# Patient Record
Sex: Female | Born: 1965 | Race: White | Hispanic: No | Marital: Single | Smoking: Never smoker
Health system: Southern US, Community
[De-identification: ages and names within clinical notes are randomized; demographics above are authoritative.]

## PROBLEM LIST (undated history)

## (undated) DIAGNOSIS — F419 Anxiety disorder, unspecified: Secondary | ICD-10-CM

## (undated) DIAGNOSIS — K219 Gastro-esophageal reflux disease without esophagitis: Secondary | ICD-10-CM

## (undated) DIAGNOSIS — R011 Cardiac murmur, unspecified: Secondary | ICD-10-CM

## (undated) DIAGNOSIS — I4719 Other supraventricular tachycardia: Secondary | ICD-10-CM

## (undated) DIAGNOSIS — R1900 Intra-abdominal and pelvic swelling, mass and lump, unspecified site: Secondary | ICD-10-CM

## (undated) DIAGNOSIS — I421 Obstructive hypertrophic cardiomyopathy: Secondary | ICD-10-CM

## (undated) DIAGNOSIS — Z9889 Other specified postprocedural states: Secondary | ICD-10-CM

## (undated) DIAGNOSIS — I472 Ventricular tachycardia, unspecified: Secondary | ICD-10-CM

## (undated) DIAGNOSIS — K922 Gastrointestinal hemorrhage, unspecified: Secondary | ICD-10-CM

## (undated) DIAGNOSIS — Z950 Presence of cardiac pacemaker: Secondary | ICD-10-CM

## (undated) DIAGNOSIS — J302 Other seasonal allergic rhinitis: Secondary | ICD-10-CM

## (undated) DIAGNOSIS — R42 Dizziness and giddiness: Secondary | ICD-10-CM

## (undated) DIAGNOSIS — D649 Anemia, unspecified: Secondary | ICD-10-CM

## (undated) DIAGNOSIS — K3580 Unspecified acute appendicitis: Secondary | ICD-10-CM

## (undated) DIAGNOSIS — D128 Benign neoplasm of rectum: Secondary | ICD-10-CM

## (undated) DIAGNOSIS — I509 Heart failure, unspecified: Secondary | ICD-10-CM

## (undated) DIAGNOSIS — R7309 Other abnormal glucose: Secondary | ICD-10-CM

## (undated) DIAGNOSIS — I471 Supraventricular tachycardia: Secondary | ICD-10-CM

## (undated) DIAGNOSIS — R739 Hyperglycemia, unspecified: Secondary | ICD-10-CM

## (undated) DIAGNOSIS — R112 Nausea with vomiting, unspecified: Secondary | ICD-10-CM

## (undated) DIAGNOSIS — I4891 Unspecified atrial fibrillation: Secondary | ICD-10-CM

## (undated) DIAGNOSIS — D219 Benign neoplasm of connective and other soft tissue, unspecified: Secondary | ICD-10-CM

## (undated) DIAGNOSIS — Z8489 Family history of other specified conditions: Secondary | ICD-10-CM

## (undated) DIAGNOSIS — K810 Acute cholecystitis: Secondary | ICD-10-CM

## (undated) HISTORY — DX: Benign neoplasm of rectum: D12.8

## (undated) HISTORY — PX: CARDIAC DEFIBRILLATOR PLACEMENT: SHX171

## (undated) HISTORY — DX: Supraventricular tachycardia: I47.1

## (undated) HISTORY — PX: APPENDECTOMY: SHX54

## (undated) HISTORY — PX: CHOLECYSTECTOMY: SHX55

## (undated) HISTORY — DX: Other seasonal allergic rhinitis: J30.2

## (undated) HISTORY — DX: Acute cholecystitis: K81.0

## (undated) HISTORY — DX: Anemia, unspecified: D64.9

## (undated) HISTORY — DX: Ventricular tachycardia: I47.2

## (undated) HISTORY — DX: Other supraventricular tachycardia: I47.19

## (undated) HISTORY — DX: Hyperglycemia, unspecified: R73.9

## (undated) HISTORY — DX: Gastrointestinal hemorrhage, unspecified: K92.2

## (undated) HISTORY — DX: Dizziness and giddiness: R42

## (undated) HISTORY — DX: Intra-abdominal and pelvic swelling, mass and lump, unspecified site: R19.00

## (undated) HISTORY — DX: Other abnormal glucose: R73.09

## (undated) HISTORY — DX: Unspecified acute appendicitis: K35.80

## (undated) HISTORY — DX: Unspecified atrial fibrillation: I48.91

## (undated) HISTORY — DX: Obstructive hypertrophic cardiomyopathy: I42.1

## (undated) HISTORY — DX: Gastro-esophageal reflux disease without esophagitis: K21.9

## (undated) HISTORY — DX: Benign neoplasm of connective and other soft tissue, unspecified: D21.9

## (undated) HISTORY — DX: Ventricular tachycardia, unspecified: I47.20

## (undated) HISTORY — DX: Anxiety disorder, unspecified: F41.9

---

## 1997-10-05 ENCOUNTER — Encounter: Admission: RE | Admit: 1997-10-05 | Discharge: 1997-10-05 | Payer: Self-pay | Admitting: Internal Medicine

## 1998-01-17 ENCOUNTER — Ambulatory Visit (HOSPITAL_COMMUNITY): Admission: RE | Admit: 1998-01-17 | Discharge: 1998-01-17 | Payer: Self-pay | Admitting: Cardiology

## 1998-10-19 ENCOUNTER — Other Ambulatory Visit: Admission: RE | Admit: 1998-10-19 | Discharge: 1998-10-19 | Payer: Self-pay | Admitting: *Deleted

## 1998-11-10 ENCOUNTER — Encounter: Admission: RE | Admit: 1998-11-10 | Discharge: 1998-11-10 | Payer: Self-pay | Admitting: Internal Medicine

## 2000-04-25 ENCOUNTER — Other Ambulatory Visit: Admission: RE | Admit: 2000-04-25 | Discharge: 2000-04-25 | Payer: Self-pay | Admitting: Obstetrics and Gynecology

## 2001-05-22 ENCOUNTER — Other Ambulatory Visit: Admission: RE | Admit: 2001-05-22 | Discharge: 2001-05-22 | Payer: Self-pay | Admitting: Obstetrics and Gynecology

## 2001-07-12 DIAGNOSIS — K810 Acute cholecystitis: Secondary | ICD-10-CM

## 2001-07-12 HISTORY — DX: Acute cholecystitis: K81.0

## 2001-07-13 ENCOUNTER — Inpatient Hospital Stay (HOSPITAL_COMMUNITY): Admission: EM | Admit: 2001-07-13 | Discharge: 2001-07-15 | Payer: Self-pay | Admitting: *Deleted

## 2001-07-13 ENCOUNTER — Encounter: Payer: Self-pay | Admitting: General Surgery

## 2001-07-13 ENCOUNTER — Encounter (INDEPENDENT_AMBULATORY_CARE_PROVIDER_SITE_OTHER): Payer: Self-pay | Admitting: *Deleted

## 2001-07-13 DIAGNOSIS — K810 Acute cholecystitis: Secondary | ICD-10-CM

## 2001-07-14 ENCOUNTER — Encounter: Payer: Self-pay | Admitting: General Surgery

## 2001-07-14 DIAGNOSIS — Z9089 Acquired absence of other organs: Secondary | ICD-10-CM

## 2001-07-14 HISTORY — PX: LAPAROSCOPIC CHOLECYSTECTOMY: SUR755

## 2002-06-30 ENCOUNTER — Other Ambulatory Visit: Admission: RE | Admit: 2002-06-30 | Discharge: 2002-06-30 | Payer: Self-pay | Admitting: Obstetrics and Gynecology

## 2003-08-03 ENCOUNTER — Other Ambulatory Visit: Admission: RE | Admit: 2003-08-03 | Discharge: 2003-08-03 | Payer: Self-pay | Admitting: Obstetrics and Gynecology

## 2004-11-20 ENCOUNTER — Other Ambulatory Visit: Admission: RE | Admit: 2004-11-20 | Discharge: 2004-11-20 | Payer: Self-pay | Admitting: Obstetrics and Gynecology

## 2005-12-06 ENCOUNTER — Other Ambulatory Visit: Admission: RE | Admit: 2005-12-06 | Discharge: 2005-12-06 | Payer: Self-pay | Admitting: Obstetrics & Gynecology

## 2006-12-17 ENCOUNTER — Other Ambulatory Visit: Admission: RE | Admit: 2006-12-17 | Discharge: 2006-12-17 | Payer: Self-pay | Admitting: Obstetrics & Gynecology

## 2007-08-29 ENCOUNTER — Inpatient Hospital Stay (HOSPITAL_COMMUNITY): Admission: AD | Admit: 2007-08-29 | Discharge: 2007-09-03 | Payer: Self-pay | Admitting: Cardiology

## 2007-08-29 ENCOUNTER — Ambulatory Visit: Payer: Self-pay | Admitting: Internal Medicine

## 2007-09-02 ENCOUNTER — Encounter: Payer: Self-pay | Admitting: Cardiology

## 2007-09-24 ENCOUNTER — Ambulatory Visit: Payer: Self-pay

## 2007-09-24 LAB — CONVERTED CEMR LAB
INR: 1.5 — ABNORMAL HIGH (ref 0.8–1.0)
Prothrombin Time: 16.9 s — ABNORMAL HIGH (ref 10.9–13.3)

## 2007-09-25 ENCOUNTER — Observation Stay (HOSPITAL_COMMUNITY): Admission: AD | Admit: 2007-09-25 | Discharge: 2007-09-25 | Payer: Self-pay | Admitting: Internal Medicine

## 2007-09-25 ENCOUNTER — Ambulatory Visit: Payer: Self-pay | Admitting: Internal Medicine

## 2007-09-25 DIAGNOSIS — Z95810 Presence of automatic (implantable) cardiac defibrillator: Secondary | ICD-10-CM | POA: Insufficient documentation

## 2007-09-30 ENCOUNTER — Ambulatory Visit: Payer: Self-pay | Admitting: Hospitalist

## 2007-09-30 ENCOUNTER — Encounter (INDEPENDENT_AMBULATORY_CARE_PROVIDER_SITE_OTHER): Payer: Self-pay | Admitting: *Deleted

## 2007-09-30 ENCOUNTER — Ambulatory Visit (HOSPITAL_COMMUNITY): Admission: RE | Admit: 2007-09-30 | Discharge: 2007-09-30 | Payer: Self-pay | Admitting: Hospitalist

## 2007-09-30 DIAGNOSIS — I4891 Unspecified atrial fibrillation: Secondary | ICD-10-CM

## 2007-09-30 DIAGNOSIS — K219 Gastro-esophageal reflux disease without esophagitis: Secondary | ICD-10-CM

## 2007-09-30 DIAGNOSIS — J301 Allergic rhinitis due to pollen: Secondary | ICD-10-CM | POA: Insufficient documentation

## 2007-09-30 LAB — CONVERTED CEMR LAB
Basophils Relative: 0 % (ref 0–1)
Lymphs Abs: 1.1 10*3/uL (ref 0.7–4.0)
MCHC: 33.2 g/dL (ref 30.0–36.0)
Monocytes Relative: 7 % (ref 3–12)
Neutro Abs: 11 10*3/uL — ABNORMAL HIGH (ref 1.7–7.7)
Neutrophils Relative %: 84 % — ABNORMAL HIGH (ref 43–77)
RBC: 4.28 M/uL (ref 3.87–5.11)
WBC: 13.1 10*3/uL — ABNORMAL HIGH (ref 4.0–10.5)

## 2007-10-02 ENCOUNTER — Ambulatory Visit: Payer: Self-pay | Admitting: Internal Medicine

## 2007-10-02 LAB — CONVERTED CEMR LAB: INR: 2.9

## 2007-10-08 ENCOUNTER — Ambulatory Visit: Payer: Self-pay

## 2007-10-16 ENCOUNTER — Ambulatory Visit: Payer: Self-pay | Admitting: Internal Medicine

## 2007-11-28 ENCOUNTER — Emergency Department (HOSPITAL_COMMUNITY): Admission: EM | Admit: 2007-11-28 | Discharge: 2007-11-29 | Payer: Self-pay | Admitting: Emergency Medicine

## 2007-12-02 ENCOUNTER — Encounter: Payer: Self-pay | Admitting: Internal Medicine

## 2007-12-04 ENCOUNTER — Encounter: Payer: Self-pay | Admitting: Internal Medicine

## 2008-01-01 ENCOUNTER — Other Ambulatory Visit: Admission: RE | Admit: 2008-01-01 | Discharge: 2008-01-01 | Payer: Self-pay | Admitting: Obstetrics and Gynecology

## 2008-01-01 ENCOUNTER — Encounter: Payer: Self-pay | Admitting: Internal Medicine

## 2008-01-10 HISTORY — PX: RADIOFREQUENCY ABLATION: SHX2290

## 2008-01-27 ENCOUNTER — Ambulatory Visit: Payer: Self-pay | Admitting: Internal Medicine

## 2008-02-10 ENCOUNTER — Encounter: Payer: Self-pay | Admitting: Internal Medicine

## 2008-02-11 ENCOUNTER — Encounter: Payer: Self-pay | Admitting: Internal Medicine

## 2008-02-11 DIAGNOSIS — I309 Acute pericarditis, unspecified: Secondary | ICD-10-CM

## 2008-02-11 LAB — CONVERTED CEMR LAB
CO2: 27 meq/L
Chloride: 105 meq/L
Creatinine, Ser: 1 mg/dL
Platelets: 204 10*3/uL
Total Bilirubin: 1 mg/dL
WBC: 16.1 10*3/uL

## 2008-02-14 ENCOUNTER — Encounter: Payer: Self-pay | Admitting: Internal Medicine

## 2008-02-15 ENCOUNTER — Encounter: Payer: Self-pay | Admitting: Internal Medicine

## 2008-04-06 ENCOUNTER — Encounter: Payer: Self-pay | Admitting: Internal Medicine

## 2008-04-26 ENCOUNTER — Ambulatory Visit: Payer: Self-pay | Admitting: Internal Medicine

## 2008-07-06 ENCOUNTER — Encounter: Payer: Self-pay | Admitting: Internal Medicine

## 2008-07-26 ENCOUNTER — Ambulatory Visit: Payer: Self-pay | Admitting: Internal Medicine

## 2008-09-01 ENCOUNTER — Ambulatory Visit: Payer: Self-pay | Admitting: Internal Medicine

## 2008-09-01 DIAGNOSIS — D509 Iron deficiency anemia, unspecified: Secondary | ICD-10-CM

## 2008-09-03 DIAGNOSIS — I421 Obstructive hypertrophic cardiomyopathy: Secondary | ICD-10-CM | POA: Insufficient documentation

## 2008-09-03 DIAGNOSIS — I472 Ventricular tachycardia, unspecified: Secondary | ICD-10-CM | POA: Insufficient documentation

## 2008-09-03 DIAGNOSIS — I471 Supraventricular tachycardia: Secondary | ICD-10-CM

## 2008-09-06 ENCOUNTER — Ambulatory Visit: Payer: Self-pay | Admitting: Internal Medicine

## 2008-09-06 LAB — CONVERTED CEMR LAB
Albumin: 4 g/dL (ref 3.5–5.2)
Basophils Absolute: 0 10*3/uL (ref 0.0–0.1)
CO2: 23 meq/L (ref 19–32)
Calcium: 9.1 mg/dL (ref 8.4–10.5)
Cholesterol: 150 mg/dL (ref 0–200)
GFR calc Af Amer: >60 mL/min (ref >60)
GFR calc non Af Amer: 59 mL/min — ABNORMAL LOW (ref >60)
Glucose, Bld: 83 mg/dL (ref 70–99)
LDL Cholesterol: 81 mg/dL (ref 0–99)
Lymphocytes Relative: 16 % (ref 12–46)
Neutro Abs: 6.5 10*3/uL (ref 1.7–7.7)
Platelets: 277 10*3/uL (ref 150–400)
Potassium: 4.5 meq/L (ref 3.5–5.3)
RDW: 13.6 % (ref 11.5–15.5)
Sodium: 141 meq/L (ref 135–145)
Total Protein: 6.4 g/dL (ref 6.0–8.3)
Triglycerides: 90 mg/dL (ref <150)

## 2008-09-07 ENCOUNTER — Encounter: Payer: Self-pay | Admitting: Internal Medicine

## 2008-09-14 ENCOUNTER — Encounter: Payer: Self-pay | Admitting: Internal Medicine

## 2008-09-14 ENCOUNTER — Ambulatory Visit: Payer: Self-pay | Admitting: Internal Medicine

## 2008-10-12 ENCOUNTER — Telehealth: Payer: Self-pay | Admitting: Adult Health

## 2008-10-12 ENCOUNTER — Ambulatory Visit: Payer: Self-pay | Admitting: Internal Medicine

## 2008-10-12 LAB — CONVERTED CEMR LAB
BUN: 11 mg/dL (ref 6–23)
Basophils Absolute: 0.1 10*3/uL (ref 0.0–0.1)
Chloride: 103 meq/L (ref 96–112)
Eosinophils Relative: 1 % (ref 0–5)
HCT: 41.2 % (ref 36.0–46.0)
Hemoglobin: 13.8 g/dL (ref 12.0–15.0)
INR: 2 — ABNORMAL HIGH (ref 0.0–1.5)
Lymphocytes Relative: 15 % (ref 12–46)
MCHC: 33.5 g/dL (ref 30.0–36.0)
Monocytes Absolute: 0.6 10*3/uL (ref 0.1–1.0)
Monocytes Relative: 6 % (ref 3–12)
Potassium: 4.1 meq/L (ref 3.5–5.3)
RBC: 4.73 M/uL (ref 3.87–5.11)
RDW: 13.5 % (ref 11.5–15.5)
Sodium: 138 meq/L (ref 135–145)

## 2008-10-13 ENCOUNTER — Ambulatory Visit: Payer: Self-pay | Admitting: Internal Medicine

## 2008-10-13 ENCOUNTER — Ambulatory Visit (HOSPITAL_COMMUNITY): Admission: RE | Admit: 2008-10-13 | Discharge: 2008-10-13 | Payer: Self-pay | Admitting: Internal Medicine

## 2008-11-02 ENCOUNTER — Encounter: Payer: Self-pay | Admitting: Internal Medicine

## 2008-11-29 ENCOUNTER — Telehealth: Payer: Self-pay | Admitting: Internal Medicine

## 2008-11-29 ENCOUNTER — Ambulatory Visit: Payer: Self-pay | Admitting: Internal Medicine

## 2008-11-30 ENCOUNTER — Ambulatory Visit (HOSPITAL_COMMUNITY): Admission: RE | Admit: 2008-11-30 | Discharge: 2008-11-30 | Payer: Self-pay | Admitting: Internal Medicine

## 2008-11-30 ENCOUNTER — Ambulatory Visit: Payer: Self-pay | Admitting: Internal Medicine

## 2008-12-22 ENCOUNTER — Ambulatory Visit: Payer: Self-pay | Admitting: Internal Medicine

## 2008-12-22 LAB — CONVERTED CEMR LAB
CO2: 24 meq/L (ref 19–32)
Calcium: 9.2 mg/dL (ref 8.4–10.5)
Chloride: 106 meq/L (ref 96–112)
Glucose, Bld: 92 mg/dL (ref 70–99)
HCT: 42.5 % (ref 36.0–46.0)
Hemoglobin: 13.1 g/dL (ref 12.0–15.0)
Hgb A1c MFr Bld: 5.6 %
MCHC: 30.8 g/dL (ref 30.0–36.0)
MCV: 90.2 fL (ref 78.0–100.0)
Platelets: 252 10*3/uL (ref 150–400)
Potassium: 5 meq/L (ref 3.5–5.3)
RDW: 14.1 % (ref 11.5–15.5)
Sodium: 143 meq/L (ref 135–145)

## 2008-12-24 DIAGNOSIS — R7309 Other abnormal glucose: Secondary | ICD-10-CM

## 2008-12-24 HISTORY — DX: Other abnormal glucose: R73.09

## 2008-12-31 ENCOUNTER — Encounter: Payer: Self-pay | Admitting: Internal Medicine

## 2009-01-12 ENCOUNTER — Telehealth: Payer: Self-pay | Admitting: Internal Medicine

## 2009-01-12 DIAGNOSIS — F4322 Adjustment disorder with anxiety: Secondary | ICD-10-CM

## 2009-01-31 ENCOUNTER — Telehealth: Payer: Self-pay | Admitting: Internal Medicine

## 2009-02-03 ENCOUNTER — Telehealth: Payer: Self-pay | Admitting: Internal Medicine

## 2009-02-04 ENCOUNTER — Ambulatory Visit: Payer: Self-pay | Admitting: Internal Medicine

## 2009-02-04 ENCOUNTER — Ambulatory Visit (HOSPITAL_COMMUNITY): Admission: RE | Admit: 2009-02-04 | Discharge: 2009-02-04 | Payer: Self-pay | Admitting: Internal Medicine

## 2009-02-04 ENCOUNTER — Ambulatory Visit: Payer: Self-pay | Admitting: Cardiology

## 2009-02-08 ENCOUNTER — Ambulatory Visit: Payer: Self-pay | Admitting: Internal Medicine

## 2009-02-17 ENCOUNTER — Encounter: Payer: Self-pay | Admitting: Internal Medicine

## 2009-02-22 ENCOUNTER — Ambulatory Visit: Payer: Self-pay | Admitting: Internal Medicine

## 2009-02-27 ENCOUNTER — Telehealth (INDEPENDENT_AMBULATORY_CARE_PROVIDER_SITE_OTHER): Payer: Self-pay | Admitting: Physician Assistant

## 2009-02-28 ENCOUNTER — Ambulatory Visit: Payer: Self-pay | Admitting: Cardiology

## 2009-02-28 ENCOUNTER — Ambulatory Visit (HOSPITAL_COMMUNITY): Admission: RE | Admit: 2009-02-28 | Discharge: 2009-02-28 | Payer: Self-pay | Admitting: Internal Medicine

## 2009-03-01 ENCOUNTER — Telehealth: Payer: Self-pay | Admitting: Internal Medicine

## 2009-03-01 ENCOUNTER — Encounter: Payer: Self-pay | Admitting: Internal Medicine

## 2009-03-07 ENCOUNTER — Telehealth: Payer: Self-pay | Admitting: Internal Medicine

## 2009-03-07 ENCOUNTER — Encounter (INDEPENDENT_AMBULATORY_CARE_PROVIDER_SITE_OTHER): Payer: Self-pay | Admitting: *Deleted

## 2009-03-11 HISTORY — PX: RADIOFREQUENCY ABLATION: SHX2290

## 2009-03-14 ENCOUNTER — Ambulatory Visit: Payer: Self-pay | Admitting: Internal Medicine

## 2009-03-29 ENCOUNTER — Encounter: Payer: Self-pay | Admitting: Internal Medicine

## 2009-03-31 ENCOUNTER — Encounter: Payer: Self-pay | Admitting: Internal Medicine

## 2009-04-02 ENCOUNTER — Encounter: Payer: Self-pay | Admitting: Internal Medicine

## 2009-04-07 ENCOUNTER — Ambulatory Visit (HOSPITAL_COMMUNITY): Admission: RE | Admit: 2009-04-07 | Discharge: 2009-04-07 | Payer: Self-pay | Admitting: Internal Medicine

## 2009-04-07 ENCOUNTER — Ambulatory Visit: Payer: Self-pay | Admitting: Internal Medicine

## 2009-04-07 DIAGNOSIS — R42 Dizziness and giddiness: Secondary | ICD-10-CM

## 2009-04-07 LAB — CONVERTED CEMR LAB
Albumin: 3.7 g/dL (ref 3.5–5.2)
Alkaline Phosphatase: 66 units/L (ref 39–117)
Basophils Absolute: 0 10*3/uL (ref 0.0–0.1)
Glucose, Bld: 85 mg/dL (ref 70–99)
HCT: 35.9 % — ABNORMAL LOW (ref 36.0–46.0)
Lymphs Abs: 1.1 10*3/uL (ref 0.7–4.0)
MCHC: 34.2 g/dL (ref 30.0–36.0)
MCV: 89.6 fL (ref >=78.0)
Neutrophils Relative %: 80 % — ABNORMAL HIGH (ref 43–77)
Platelets: 248 10*3/uL (ref 150–400)
Potassium: 4.5 meq/L (ref 3.5–5.3)
Sodium: 138 meq/L (ref 135–145)
Total Protein: 6.9 g/dL (ref 6.0–8.3)

## 2009-04-08 ENCOUNTER — Telehealth: Payer: Self-pay

## 2009-06-12 ENCOUNTER — Encounter: Payer: Self-pay | Admitting: Internal Medicine

## 2009-06-14 ENCOUNTER — Ambulatory Visit: Payer: Self-pay | Admitting: Internal Medicine

## 2009-06-16 ENCOUNTER — Encounter: Payer: Self-pay | Admitting: Internal Medicine

## 2009-06-28 ENCOUNTER — Encounter: Payer: Self-pay | Admitting: Internal Medicine

## 2009-07-29 ENCOUNTER — Encounter: Payer: Self-pay | Admitting: Internal Medicine

## 2009-08-04 ENCOUNTER — Encounter: Payer: Self-pay | Admitting: Internal Medicine

## 2009-08-05 ENCOUNTER — Telehealth: Payer: Self-pay | Admitting: Internal Medicine

## 2009-08-05 ENCOUNTER — Encounter: Payer: Self-pay | Admitting: Internal Medicine

## 2009-08-08 ENCOUNTER — Encounter: Payer: Self-pay | Admitting: Internal Medicine

## 2009-08-08 ENCOUNTER — Ambulatory Visit: Payer: Self-pay | Admitting: Internal Medicine

## 2009-08-08 ENCOUNTER — Ambulatory Visit (HOSPITAL_COMMUNITY): Admission: RE | Admit: 2009-08-08 | Discharge: 2009-08-08 | Payer: Self-pay | Admitting: Internal Medicine

## 2009-08-11 ENCOUNTER — Encounter: Payer: Self-pay | Admitting: Internal Medicine

## 2009-08-31 ENCOUNTER — Encounter: Payer: Self-pay | Admitting: Internal Medicine

## 2009-09-08 ENCOUNTER — Encounter: Payer: Self-pay | Admitting: Internal Medicine

## 2009-09-20 ENCOUNTER — Ambulatory Visit: Payer: Self-pay | Admitting: Internal Medicine

## 2009-09-29 ENCOUNTER — Telehealth: Payer: Self-pay | Admitting: Internal Medicine

## 2009-10-03 ENCOUNTER — Telehealth: Payer: Self-pay | Admitting: Internal Medicine

## 2009-10-03 ENCOUNTER — Ambulatory Visit: Payer: Self-pay | Admitting: Internal Medicine

## 2009-10-03 LAB — CONVERTED CEMR LAB

## 2009-10-17 ENCOUNTER — Telehealth: Payer: Self-pay | Admitting: Internal Medicine

## 2009-10-18 ENCOUNTER — Telehealth: Payer: Self-pay | Admitting: Internal Medicine

## 2009-10-24 ENCOUNTER — Ambulatory Visit: Payer: Self-pay | Admitting: Internal Medicine

## 2009-10-24 ENCOUNTER — Ambulatory Visit: Payer: Self-pay | Admitting: Infectious Disease

## 2009-10-24 ENCOUNTER — Telehealth (INDEPENDENT_AMBULATORY_CARE_PROVIDER_SITE_OTHER): Payer: Self-pay | Admitting: Pharmacist

## 2009-10-24 ENCOUNTER — Encounter: Payer: Self-pay | Admitting: Pharmacist

## 2009-10-24 ENCOUNTER — Telehealth: Payer: Self-pay | Admitting: Internal Medicine

## 2009-10-24 ENCOUNTER — Ambulatory Visit (HOSPITAL_COMMUNITY): Admission: RE | Admit: 2009-10-24 | Discharge: 2009-10-24 | Payer: Self-pay | Admitting: Internal Medicine

## 2009-11-01 ENCOUNTER — Ambulatory Visit: Payer: Self-pay | Admitting: Internal Medicine

## 2009-11-01 LAB — CONVERTED CEMR LAB: INR: 2.8

## 2009-11-02 ENCOUNTER — Encounter: Payer: Self-pay | Admitting: Internal Medicine

## 2009-11-14 ENCOUNTER — Encounter: Payer: Self-pay | Admitting: Internal Medicine

## 2009-11-15 ENCOUNTER — Telehealth: Payer: Self-pay | Admitting: Internal Medicine

## 2009-11-16 ENCOUNTER — Ambulatory Visit (HOSPITAL_COMMUNITY): Admission: RE | Admit: 2009-11-16 | Discharge: 2009-11-16 | Payer: Self-pay | Admitting: Internal Medicine

## 2009-11-16 ENCOUNTER — Ambulatory Visit: Payer: Self-pay | Admitting: Internal Medicine

## 2009-11-28 ENCOUNTER — Ambulatory Visit: Payer: Self-pay | Admitting: Internal Medicine

## 2009-11-28 LAB — CONVERTED CEMR LAB: INR: 2

## 2009-12-02 ENCOUNTER — Encounter: Payer: Self-pay | Admitting: Internal Medicine

## 2009-12-22 ENCOUNTER — Ambulatory Visit: Payer: Self-pay | Admitting: Internal Medicine

## 2009-12-26 ENCOUNTER — Ambulatory Visit: Payer: Self-pay | Admitting: Internal Medicine

## 2009-12-26 LAB — CONVERTED CEMR LAB

## 2009-12-28 ENCOUNTER — Encounter: Payer: Self-pay | Admitting: Internal Medicine

## 2010-01-05 ENCOUNTER — Ambulatory Visit: Payer: Self-pay | Admitting: Internal Medicine

## 2010-01-13 ENCOUNTER — Encounter: Payer: Self-pay | Admitting: Internal Medicine

## 2010-01-16 ENCOUNTER — Ambulatory Visit: Payer: Self-pay | Admitting: Internal Medicine

## 2010-01-16 LAB — CONVERTED CEMR LAB: INR: 2.5

## 2010-01-23 ENCOUNTER — Ambulatory Visit: Payer: Self-pay | Admitting: Internal Medicine

## 2010-01-30 ENCOUNTER — Encounter: Payer: Self-pay | Admitting: Internal Medicine

## 2010-01-30 ENCOUNTER — Ambulatory Visit: Payer: Self-pay | Admitting: Internal Medicine

## 2010-01-30 LAB — CONVERTED CEMR LAB: INR: 2.1

## 2010-02-23 ENCOUNTER — Encounter: Payer: Self-pay | Admitting: Internal Medicine

## 2010-02-27 ENCOUNTER — Encounter: Payer: Self-pay | Admitting: Internal Medicine

## 2010-02-27 ENCOUNTER — Inpatient Hospital Stay (HOSPITAL_COMMUNITY): Admission: EM | Admit: 2010-02-27 | Discharge: 2010-03-01 | Payer: Self-pay | Admitting: Emergency Medicine

## 2010-02-27 ENCOUNTER — Ambulatory Visit: Payer: Self-pay | Admitting: Cardiology

## 2010-03-01 ENCOUNTER — Encounter: Payer: Self-pay | Admitting: Internal Medicine

## 2010-03-01 DIAGNOSIS — F419 Anxiety disorder, unspecified: Secondary | ICD-10-CM | POA: Insufficient documentation

## 2010-03-02 ENCOUNTER — Telehealth: Payer: Self-pay | Admitting: Internal Medicine

## 2010-03-06 ENCOUNTER — Ambulatory Visit: Payer: Self-pay | Admitting: Internal Medicine

## 2010-03-06 LAB — CONVERTED CEMR LAB: INR: 2.5

## 2010-03-08 ENCOUNTER — Telehealth: Payer: Self-pay | Admitting: Internal Medicine

## 2010-03-20 ENCOUNTER — Telehealth (INDEPENDENT_AMBULATORY_CARE_PROVIDER_SITE_OTHER): Payer: Self-pay | Admitting: *Deleted

## 2010-03-24 ENCOUNTER — Encounter: Payer: Self-pay | Admitting: Internal Medicine

## 2010-03-27 ENCOUNTER — Ambulatory Visit: Payer: Self-pay | Admitting: Internal Medicine

## 2010-03-27 LAB — CONVERTED CEMR LAB: INR: 2

## 2010-03-30 ENCOUNTER — Ambulatory Visit: Payer: Self-pay | Admitting: Internal Medicine

## 2010-04-03 ENCOUNTER — Ambulatory Visit: Payer: Self-pay | Admitting: Internal Medicine

## 2010-04-03 ENCOUNTER — Emergency Department (HOSPITAL_COMMUNITY): Admission: EM | Admit: 2010-04-03 | Discharge: 2010-04-04 | Payer: Self-pay | Admitting: Emergency Medicine

## 2010-04-10 ENCOUNTER — Ambulatory Visit: Payer: Self-pay | Admitting: Internal Medicine

## 2010-04-18 ENCOUNTER — Telehealth (INDEPENDENT_AMBULATORY_CARE_PROVIDER_SITE_OTHER): Payer: Self-pay | Admitting: *Deleted

## 2010-04-19 ENCOUNTER — Ambulatory Visit: Payer: Self-pay | Admitting: Internal Medicine

## 2010-04-19 ENCOUNTER — Encounter: Payer: Self-pay | Admitting: Internal Medicine

## 2010-04-25 ENCOUNTER — Ambulatory Visit: Payer: Self-pay | Admitting: Internal Medicine

## 2010-04-25 LAB — CONVERTED CEMR LAB
Albumin: 4.3 g/dL (ref 3.5–5.2)
BUN: 11 mg/dL (ref 6–23)
CO2: 28 meq/L (ref 19–32)
Calcium: 9 mg/dL (ref 8.4–10.5)
Chloride: 106 meq/L (ref 96–112)
Cholesterol: 153 mg/dL (ref 0–200)
Creatinine, Ser: 0.79 mg/dL (ref 0.40–1.20)
Eosinophils Relative: 1 % (ref 0–5)
Glucose, Bld: 91 mg/dL (ref 70–99)
HCT: 43.6 % (ref 36.0–46.0)
HDL: 44 mg/dL (ref >39)
Hemoglobin: 13.6 g/dL (ref 12.0–15.0)
Lymphocytes Relative: 17 % (ref 12–46)
Lymphs Abs: 1.8 10*3/uL (ref 0.7–4.0)
Monocytes Absolute: 0.6 10*3/uL (ref 0.1–1.0)
Neutro Abs: 8.2 10*3/uL — ABNORMAL HIGH (ref 1.7–7.7)
Total CHOL/HDL Ratio: 3.5
VLDL: 19 mg/dL (ref 0–40)
WBC: 10.8 10*3/uL — ABNORMAL HIGH (ref 4.0–10.5)

## 2010-05-01 ENCOUNTER — Ambulatory Visit: Payer: Self-pay | Admitting: Internal Medicine

## 2010-05-10 ENCOUNTER — Telehealth: Payer: Self-pay | Admitting: Internal Medicine

## 2010-05-19 ENCOUNTER — Inpatient Hospital Stay (HOSPITAL_COMMUNITY)
Admission: EM | Admit: 2010-05-19 | Discharge: 2010-05-21 | Payer: Self-pay | Source: Home / Self Care | Attending: Internal Medicine | Admitting: Internal Medicine

## 2010-05-22 ENCOUNTER — Ambulatory Visit: Payer: Self-pay | Admitting: Internal Medicine

## 2010-05-22 LAB — CONVERTED CEMR LAB: INR: 3.6

## 2010-05-29 ENCOUNTER — Encounter: Payer: Self-pay | Admitting: Internal Medicine

## 2010-05-30 ENCOUNTER — Encounter: Payer: Self-pay | Admitting: Internal Medicine

## 2010-06-01 ENCOUNTER — Encounter: Payer: Self-pay | Admitting: Internal Medicine

## 2010-06-02 ENCOUNTER — Encounter: Payer: Self-pay | Admitting: Internal Medicine

## 2010-06-12 ENCOUNTER — Ambulatory Visit: Admission: RE | Admit: 2010-06-12 | Discharge: 2010-06-12 | Payer: Self-pay | Source: Home / Self Care

## 2010-06-15 ENCOUNTER — Ambulatory Visit
Admission: RE | Admit: 2010-06-15 | Discharge: 2010-06-15 | Payer: Self-pay | Source: Home / Self Care | Attending: Internal Medicine | Admitting: Internal Medicine

## 2010-06-15 ENCOUNTER — Encounter: Payer: Self-pay | Admitting: Internal Medicine

## 2010-06-19 ENCOUNTER — Encounter: Payer: Self-pay | Admitting: Internal Medicine

## 2010-06-21 ENCOUNTER — Encounter: Payer: Self-pay | Admitting: Internal Medicine

## 2010-06-27 DIAGNOSIS — I4891 Unspecified atrial fibrillation: Secondary | ICD-10-CM

## 2010-06-27 DIAGNOSIS — Z7901 Long term (current) use of anticoagulants: Secondary | ICD-10-CM | POA: Insufficient documentation

## 2010-06-27 DIAGNOSIS — Z9229 Personal history of other drug therapy: Secondary | ICD-10-CM | POA: Insufficient documentation

## 2010-07-03 ENCOUNTER — Encounter: Payer: Self-pay | Admitting: Internal Medicine

## 2010-07-03 ENCOUNTER — Ambulatory Visit: Admission: RE | Admit: 2010-07-03 | Discharge: 2010-07-03 | Payer: Self-pay | Source: Home / Self Care

## 2010-07-03 ENCOUNTER — Ambulatory Visit
Admission: RE | Admit: 2010-07-03 | Discharge: 2010-07-03 | Payer: Self-pay | Source: Home / Self Care | Attending: Internal Medicine | Admitting: Internal Medicine

## 2010-07-03 LAB — CONVERTED CEMR LAB
CO2: 27 meq/L (ref 19–32)
Chloride: 102 meq/L (ref 96–112)
INR: 3.2
Sodium: 139 meq/L (ref 135–145)

## 2010-07-11 NOTE — Progress Notes (Signed)
Summary: call pt at work  Phone Note Call from Patient Call back at Work Phone [phone]   Caller: Patient Reason for Call: Talk to Nurse, Talk to Doctor Summary of Call: please call pt at work Initial call taken by: Omer Jack,  Oct 17, 2009 1:09 PM  Follow-up for Phone Call        Spoke with patient, she had not yet heard from Dr Macon Large at [facility], resent information to [place] with Dr Macon Large.  Pt will call back if she hasn't heard from them by Wednesday. Gypsy Balsam RN BSN  Oct 17, 2009 3:47 PM

## 2010-07-11 NOTE — Progress Notes (Signed)
Summary: heart out of rhythm  Phone Note Call from Patient Call back at [facility] Phone [phone] Call back at Work Phone [phone]   Caller: Patient Reason for Call: Talk to Nurse Summary of Call: pt call on - call md last night c/o heart out of rhythm. wants to discuss cardioversion.  Initial call taken by: Lorne Skeens,  Oct 24, 2009 8:13 AM  Follow-up for Phone Call        Pt calling back need to talk to someone now Judie Grieve  Oct 24, 2009 9:42 AM  spoke with pt, she is in atrial fib and wants to have a cardioversion today if possible. call placed to dr Acquanetta Belling to get protime results. spoke with the ep lab the only time available would be 5pm today. will await protime results Deliah Goody, RN  Oct 24, 2009 10:03 AM   Additional Follow-up for Phone Call Additional follow up Details #1::        INR from [facility] from 09/08/09 was 2.5 and the pt 10-03-09 INR with dr Alexandria Lodge was 2.9. pt will go to the short stay department around 2:30pm for a protime and to get ready for cardioversion today with dr taylor Deliah Goody, RN  Oct 24, 2009 2:06 PM

## 2010-07-11 NOTE — Assessment & Plan Note (Signed)
Summary: follow-up visit   Vital Signs:  Patient profile:   45 year old female Height:      65 inches Weight:      168.6 pounds BMI:     28.16 Temp:     97.7 degrees F oral Pulse rate:   64 / minute BP sitting:   105 / 70  (right arm)  Vitals Entered By: Filomena Jungling NT II (Nov 01, 2009 4:29 PM) Is Patient Diabetic? No Pain Assessment Patient in pain? no      Nutritional Status BMI of 25 - 29 = overweight  Have you ever been in a relationship where you felt threatened, hurt or afraid?No   Does patient need assistance? Functional Status Self care Ambulation Normal   Primary Care Provider:  Margarito Liner MD   History of Present Illness: Patient returns for followup of her chronic medical problems including recurrent atrial flutter/atrial fibrillation, chronic warfarin therapy, and seasonal allergic rhinitis.  She was recently hospitalized on May 16 here at [facility] with apparent recurrent atrial flutter and was cardioverted. She has an appointment tomorrow at [facility] with Dr. Sherlene Shams. She also reports that Dr. Graciela Husbands has referred her to Dr. Macon Large at [facility] on June 24 for management of her arrhythmias given her recurrent episodes of atrial fibrillation/atrial flutter requiring cardioversion. Today she has no acute complaints. She reports that she is compliant with her medications. She has had no recurrent anxiety symptoms.  Preventive Screening-Counseling & Management  Alcohol-Tobacco     Alcohol drinks/day: <1     Smoking Status: never  Current Medications (verified): 1)  Warfarin Sodium 4 Mg Tabs (Warfarin Sodium) .... Use As Directed By Anticoagulation Clinic 2)  Claritin 10 Mg  Caps (Loratadine) .... Take 1 Tablet By Mouth Once A Day As Needed 3)  Yasmin 28 3-0.03 Mg Tabs (Drospirenone-Ethinyl Estradiol) .... As Directed 4)  Cardizem Cd 120 Mg Xr24h-Cap (Diltiazem Hcl Coated Beads) .... Take 1 Tablet By Mouth Once A Day 5)  Multaq 400 Mg Tabs (Dronedarone Hcl)  .... Take 1 Tablet By Mouth Two Times A Day ***note: Managed By Cardiologist Dr. Sampson Goon At [facility]***  Allergies (verified): No Known Drug Allergies  Physical Exam  General:  alert, no distress Lungs:  normal respiratory effort, normal breath sounds, no crackles, and no wheezes.   Heart:  normal rate, regular rhythm, no murmur, no gallop, and no rub.   Abdomen:  soft, non-tender, and normal bowel sounds.   Extremities:  no edema   Impression & Recommendations:  Problem # 1:  ATRIAL FLUTTER (ICD-427.32) Patient will followup with Dr. Graciela Husbands and Dr. Sherlene Shams, and she has been referred by Dr. Graciela Husbands to Dr. Macon Large at [facility] for management of her recurrent atrial fibrillation/atrial flutter. She has done well since her most recent cardioversion on May 16, and is currently asymptomatic. She will continue her medications as per her cardiologists.  The following medications were removed from the medication list:    Baby Aspirin 81 Mg Chew (Aspirin) .Marland Kitchen... Take 1 tablet by mouth once a day Her updated medication list for this problem includes:    Warfarin Sodium 4 Mg Tabs (Warfarin sodium) ..... Use as directed by anticoagulation clinic    Cardizem Cd 120 Mg Xr24h-cap (Diltiazem hcl coated beads) .Marland Kitchen... Take 1 tablet by mouth once a day    Multaq 400 Mg Tabs (Dronedarone hcl) .Marland Kitchen... Take 1 tablet by mouth two times a day ***note: managed by cardiologist dr. Sampson Goon at [facility]***  Problem # 2:  ALLERGIC RHINITIS, SEASONAL (ICD-477.0) Symptoms are well controlled on current regimen.  Problem # 3:  COUMADIN THERAPY (ICD-V58.61) Patient has now established with Dr. Alexandria Lodge in our anticoagulation clinic for management of her chronic warfarin therapy.  Complete Medication List: 1)  Warfarin Sodium 4 Mg Tabs (Warfarin sodium) .... Use as directed by anticoagulation clinic 2)  Claritin 10 Mg Caps (Loratadine) .... Take 1 tablet by mouth once a day as needed 3)  Yasmin  28 3-0.03 Mg Tabs (Drospirenone-ethinyl estradiol) .... As directed 4)  Cardizem Cd 120 Mg Xr24h-cap (Diltiazem hcl coated beads) .... Take 1 tablet by mouth once a day 5)  Multaq 400 Mg Tabs (Dronedarone hcl) .... Take 1 tablet by mouth two times a day ***note: managed by cardiologist dr. Sampson Goon at [facility]***  Patient Instructions: 1)  Please schedule a follow-up appointment in 3 months.     Prevention & Chronic Care Immunizations   Influenza vaccine: Not documented   Influenza vaccine deferral: Refused  (04/07/2009)   Influenza vaccine due: 02/09/2010    Tetanus booster: 12/17/2006: Historical    Pneumococcal vaccine: Not documented  Other Screening   Pap smear: Satisfactory for evaluation.  Atypical Squamous Cells of undetermined significance (ASC-US).    (01/01/2008)   Pap smear due: 12/31/2008    Mammogram: Assessment: BIRADS 1 - Negative. Exam by [facility]   (01/07/2009)   Mammogram due: 01/2010  Reports requested:   Last Pap report requested.  Smoking status: never  (11/01/2009)    Screening comments: Paps done by Ria Comment at [facility] [address]; last done last July  Lipids   Total Cholesterol: 150  (09/06/2008)   LDL: 81  (09/06/2008)   LDL Direct: Not documented   HDL: 51  (09/06/2008)   Triglycerides: 90  (09/06/2008)   Nursing Instructions: Request report of last Pap

## 2010-07-11 NOTE — Cardiovascular Report (Signed)
Summary: Office Visit   Office Visit   Imported By: Roderic Ovens 09/23/2009 16:08:28  _____________________________________________________________________  External Attachment:    Type:   Image     Comment:   External Document

## 2010-07-11 NOTE — Letter (Signed)
Summary: [facility] EP Return Patient   [facility] EP Return Patient   Imported By: Roderic Ovens 02/21/2010 16:08:41  _____________________________________________________________________  External Attachment:    Type:   Image     Comment:   External Document

## 2010-07-11 NOTE — Consult Note (Signed)
Summary: [facility]: Electrohysiology  [facility]: Electrohysiology   Imported By: Shon Hough 12/30/2009 13:50:52  _____________________________________________________________________  External Attachment:    Type:   Image     Comment:   External Document

## 2010-07-11 NOTE — Assessment & Plan Note (Signed)
Summary: COU/CH  Anticoagulant Therapy PCP: Margarito Liner MD [facility] Attending: Onalee Hua MD, Manrique Indication 1: Atrial fibrillation Indication 2: Encounter for therapeutic drug monitoring  V58.83 Start date: 08/10/2007  Patient Assessment Reviewed by: Chancy Milroy PharmD  April 10, 2010 Medication review: verified warfarin dosage & schedule,verified previous prescription medications, verified doses & any changes, verified new medications, reviewed OTC medications, reviewed OTC health products-vitamins supplements etc Complications: none Dietary changes: none   Health status changes: none   Lifestyle changes: none   Recent/future hospitalizations: none   Recent/future procedures: none   Recent/future dental: none Patient Assessment Part 2:  Have you MISSED ANY DOSES or CHANGED TABLETS?  No missed Warfarin doses or changed tablets.  Have you had any BRUISING or BLEEDING ( nose or gum bleeds,blood in urine or stool)?  No reported bruising or bleeding in nose, gums, urine, stool.  Have you STARTED or STOPPED any MEDICATIONS, including OTC meds,herbals or supplements?  No other medications or herbal supplements were started or stopped.  Have you CHANGED your DIET, especially green vegetables,or ALCOHOL intake?  No changes in diet or alcohol intake.  Have you had any ILLNESSES or HOSPITALIZATIONS?  No reported illnesses or hospitalizations  Have you had any signs of CLOTTING?(chest discomfort,dizziness,shortness of breath,arms tingling,slurred speech,swelling or redness in leg)    No chest discomfort, dizziness, shortness of breath, tingling in arm, slurred speech, swelling, or redness in leg.     Treatment  Target INR: 2.0-3.0 INR: 2.5  Date: 04/10/2010 Regimen In:  38.0mg /week INR reflects regimen in: 2.5  New  Tablet strength: : 4mg  Regimen Out:     Sunday: 1 & 1/2 Tablet     Monday: 1 Tablet     Tuesday: 1 & 1/2 Tablet     Wednesday: 1 & 1/2 Tablet     Thursday: 1  Tablet      Friday: 1 & 1/2 Tablet     Saturday: 1 & 1/2 Tablet Total Weekly: 38.0mg /week mg  Next INR Due: 05/01/2010 Adjusted by: Barbera Setters. Alexandria Lodge III PharmD CACP   Return to anticoagulation clinic:  05/01/2010 Time of next visit: 1630    Allergies: No Known Drug Allergies

## 2010-07-11 NOTE — Progress Notes (Signed)
Summary: question re icd magnet  Phone Note Call from Patient   Caller: Patient [phone] Reason for Call: Talk to Nurse Summary of Call: pt calling re icd magnet-pls call  Initial call taken by: Glynda Jaeger,  March 02, 2010 10:23 AM  Follow-up for Phone Call        No answer, left message for patient. Follow-up by: Altha Harm, LPN,  March 02, 2010 6:00 PM  Additional Follow-up for Phone Call Additional follow up Details #1::        Spoke with patient concerning magnet placement for inappropriate shocks.  Ms. Rauh also wanted contact information for Caralyn Guile. Additional Follow-up by: Altha Harm, LPN,  March 03, 2010 9:56 AM

## 2010-07-11 NOTE — Progress Notes (Signed)
Summary: afib  Phone Note Call from Patient Call back at Home Phone [phone]   Caller: Patient Reason for Call: Talk to Nurse Summary of Call: req to speak to Amber... heart is out of rhythm Initial call taken by: Migdalia Dk,  August 05, 2009 9:54 AM  Follow-up for Phone Call        Spoke with patient.  Pt reports going back into afib this morning.  Currently on Multaq 400mg  twice daily, Cardizem CD 120mg  daily,  and Coumadin.  INR being followed by [facility].  INR yesterday was 2.0, end of January was 2.2.  Requested INR's be faxed to [name] by [facility]. D/W Dr Graciela Husbands, patient to rest this weekend and plan DCCV Monday if pt still in afib.  Pt requests DCCV be done today because in the past she has never converted on her own and she is scheduled to work on Monday.  Called anesthesia, they are unable to do DCCV today.  Scheduled for Monday, Feb 28th at [facility] with Dr Graciela Husbands.  Pt aware and agrees.  Instructions reviewed with patient.  BP was 122/91 HR 140 per patient report.  Pt advised if SBP>120, she can take extra Cardizem over weekend to slow rate.  Gypsy Balsam RN BSN  August 05, 2009 11:21 AM

## 2010-07-11 NOTE — Letter (Signed)
Summary: [facility] Follow Up Note  [facility] Follow Up Note   Imported By: Roderic Ovens 08/25/2009 14:27:34  _____________________________________________________________________  External Attachment:    Type:   Image     Comment:   External Document

## 2010-07-11 NOTE — Letter (Signed)
Summary: [name] EP Return Patient   [facility] EP Return Patient   Imported By: Roderic Ovens 03/28/2010 17:13:01  _____________________________________________________________________  External Attachment:    Type:   Image     Comment:   External Document

## 2010-07-11 NOTE — Progress Notes (Signed)
Summary: returning call  Phone Note Call from Patient Call back at Work Phone [phone]   Caller: Patient Reason for Call: Talk to Nurse Summary of Call: returning call Initial call taken by: Migdalia Dk,  October 03, 2009 8:49 AM  Follow-up for Phone Call        pt calling again, request call back, [phone](unitl 3:15) [phone] Migdalia Dk  October 05, 2009 2:03 PM   Additional Follow-up for Phone Call Additional follow up Details #1::        Spoke with Ms. Huot and told her that Amber will be calling her back later today.  She said that would be fine.  Her number after 3:15 is [phone] Mylo Red RN BSN    Additional Follow-up for Phone Call Additional follow up Details #2::    Spoke with patient, she would like referral to Dr Macon Large at [facility] for conversion afib procedure where they do epicardial and endocardial ablation together.  Rayna Sexton with Dr Macon Large [phone]), she requested records and demographics be faxed to her at [phone]) and she will contact patient for appointment.  Pt aware and agrees with plan. Gypsy Balsam RN BSN  October 05, 2009 3:51 PM

## 2010-07-11 NOTE — Assessment & Plan Note (Signed)
Summary: eph/ cardioversion by dr allred/ gd   Primary Provider:  Margarito Liner MD  CC:  eph.  cardioversion by Dr. Johney Frame.  Pt states she does not feel bad today.   Marland Kitchen  History of Present Illness: Ms. Oconnell history of followup for atrial fibrillation occurring in the setting of hypertrophic cardiomyopathy. She is status post PVI x2 has had recurrence despite ongoing therapy with dronaderone.  This is been quite frustrating for her. She was underwent cardioversion again.We have intercurrently sent her to see Dr Macon Large at [facility] discussed multiple options with her .  She has had no intercurrent AF  she acknowledges being sad frustrated angry and somewhat depressed     Current Medications (verified): 1)  Warfarin Sodium 4 Mg Tabs (Warfarin Sodium) .... Use As Directed By Anticoagulation Clinic/ Pt Takes One Tablet Every Day Except Thursday When She Takes 1/2 Tablet 2)  Claritin 10 Mg  Caps (Loratadine) .... Take 1 Tablet By Mouth Once A Day As Needed 3)  Yasmin 28 3-0.03 Mg Tabs (Drospirenone-Ethinyl Estradiol) .... As Directed 4)  Cardizem Cd 120 Mg Xr24h-Cap (Diltiazem Hcl Coated Beads) .... Take 1 Tablet By Mouth Once A Day 5)  Multaq 400 Mg Tabs (Dronedarone Hcl) .... Take 1 Tablet By Mouth Two Times A Day ***note: Managed By Cardiologist Dr. 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      ATRIAL FIBRILLATION (ICD-427.31) -- S/P radiofrequency ablation by Dr. Clydie Braun at [facility] on 02/09/2008   COUMADIN THERAPY (ICD-V58.61) -- Managed by cardiologist Dr. Chanda Busing     ATRIAL FLUTTER (ICD-427.32) -- S/P radiofrequency ablation by Dr. Clydie Braun at [facility] on 02/09/2008         Hx of PERICARDITIS, ACUTE (ICD-420.90) -- Post-procedural following radiofrequency ablation at [facility]     VENTRICULAR TACHYCARDIA (ICD-427.1) -- Hx of nonsustained ventricular tachycardia, S/P hospitalization in March 2009; S/P ICD placement by Dr. Duke Salvia on 09/25/2007.         AUTOMATIC IMPLANTABLE CARDIAC DEFIBRILLATOR SITU (ICD-V45.02) -- Placed by Dr. Duke Salvia on 09/25/2007. UNSPECIFIED ANEMIA (ICD-285.9) --  GERD (ICD-530.81) --  ALLERGIC RHINITIS, SEASONAL (ICD-477.0) --  Hx of CHOLECYSTITIS, ACUTE (ICD-575.0) -- Acute cholecystitis with choledocholithiasis 07/13/2001     CHOLECYSTECTOMY, LAPAROSCOPIC, HX OF (ICD-V45.79) -- S/P laparoscopic cholecystectomy, intraoperative cholangiogram, and transcystic common bile duct exploration by Dr. Avel Peace on 07/14/2001    Past Surgical History: Last updated: 2008/09/14 Cholecystectomy (laparascopic) 07/14/2001 Implantation of defibrillator 09/25/2007  Family History: Last updated: 14-Sep-2008 Cousin- died from HOCM Sister- Pacemaker, HOCM MOM- pacemaker/AICD Father had throat cancer Paternal aunt had breast cancer. No ovarian cancer in first degree relatives. No colon cancer. Grandfather and mother had colon polyps. No lung cancer. Two grandparents had MIs. Father had CABG in his 83s.  Social History: Last updated: 09/30/2007 Pt works as Therapist, occupational Never Smoked  Vital Signs:  Patient profile:   45 year old female Height:      65 inches Weight:      163 pounds BMI:     27.22 Pulse rate:  51 / minute Pulse rhythm:   regular BP sitting:   112 / 73  (left arm) Cuff size:   regular  Vitals Entered By: Judithe Modest CMA (January 05, 2010 11:53 AM)  Physical Exam  General:  The patient was alert and  oriented in no acute distress. HEENT Normal.  Neck veins were flat, carotids were brisk.  Lungs were clear.  Heart sounds were regular without murmurs or gallops.  Abdomen was soft with active bowel sounds. There is no clubbing cyanosis or edema. Skin Warm and dry     ICD Specifications Following MD:  Sherryl Manges, MD     ICD Vendor:  [employer]     ICD Model Number:  [id number]     ICD Serial Number:  [id number] ICD DOI:  09/25/2007     ICD Implanting MD:  Sherryl Manges, MD  Lead 1:    Location: RA     DOI: 09/25/2007     Model #: [id number]     Serial #: [id number]     Status: active Lead 2:    Location: RV     DOI: 09/25/2007     Model #: [id number]     Serial #: [id number]     Status: active  Indications::  HOCM   ICD Follow Up Battery Voltage:  3.19 V     Charge Time:  10.8 seconds     Battery Est. Longevity:  6.6-7.2 yrs Underlying rhythm:  SR ICD Dependent:  No       ICD Device Measurements Atrium:  Amplitude: 4.4 mV, Impedance: 390 ohms, Threshold: 0.5 V at 0.5 msec Right Ventricle:  Amplitude: 11.7 mV, Impedance: 440 ohms, Threshold: 0.75 V at 0.5 msec Shock Impedance: 61 ohms   Episodes MS Episodes:  0     Coumadin:  Yes Shock:  0     ATP:  0     Nonsustained:  0     Atrial Therapies:  0 Atrial Pacing:  <1%     Ventricular Pacing:  <1%  Brady Parameters Mode DDD     Lower Rate Limit:  40     Upper Rate Limit 100 PAV 350     Sensed AV Delay:  325  Tachy Zones VF:  230     Next Remote Date:  04/06/2010     Tech Comments:  NORMAL DEVICE FUNCTION.  NO EPISODES SINCE LAST CHECK.  CHANGED RV OUTPUT FROM 2.0 TO 2.5 V.  MERLIN CHECK 04-06-10. Vella Kohler  Impression & Recommendations:  Problem # 1:  ATRIAL FIBRILLATION (ICD-427.31) no intercurrent AF  We discussed the recent PALLAS results and the risk aand benefits of Tikosyn.  She would like to plan to do that, probably in the end of Aug.  She would [facility] to [facility] at [facility]   Her updated medication list for this problem includes:     Warfarin Sodium 4 Mg Tabs (Warfarin sodium) ..... Use as directed by anticoagulation clinic/ pt takes one tablet every day except thursday when she takes 1/2 tablet    Multaq 400 Mg Tabs (Dronedarone hcl) .Marland Kitchen... Take 1 tablet by mouth two times a day ***note: managed by cardiologist dr. 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 Use as directed by anticoagulation clinic/ pt takes one tablet every day except thursday when she  takes 1/2 tablet    Cardizem Cd 120 Mg Xr24h-cap (Diltiazem hcl coated beads) .Marland Kitchen... Take 1 tablet by mouth once a day    Multaq 400 Mg Tabs (Dronedarone hcl) .Marland Kitchen... Take 1 tablet by mouth two times a day ***note: managed by cardiologist dr. Sampson Goon at [facility]***  Problem # 3:  AUTOMATIC IMPLANTABLE CARDIAC DEFIBRILLATOR SITU (ICD-V45.02) Device parameters and data were reviewed and no changes were made  Problem # 4:  VENTRICULAR TACHYCARDIA (ICD-427.1) no intercurrent VT

## 2010-07-11 NOTE — Cardiovascular Report (Signed)
Summary: Office Visit Remote   Office Visit Remote   Imported By: Roderic Ovens 04/20/2010 15:46:16  _____________________________________________________________________  External Attachment:    Type:   Image     Comment:   External Document

## 2010-07-11 NOTE — Progress Notes (Signed)
Summary: Funny feeling over pacemaker  Phone Note Call from Patient Call back at Work Phone [phone]   Caller: Patient Summary of Call: Pt having a funny feeling over pacemaker no pain and it has not shocked pt Initial call taken by: Judie Grieve,  March 20, 2010 9:26 AM  Additional Follow-up for Phone Call Additional follow up Details #1::        spoke w/pt and had some tingling at device incision on friday evening.  pt nervous that lead has "come loose".  pt to send merlin transmission this evening.  To call pt if there is a problem. Vella Kohler  March 20, 2010 3:41 PM

## 2010-07-11 NOTE — Progress Notes (Signed)
  Phone Note Call from Patient   Caller: Patient Call For: Hulen Luster PharmD CACP Reason for Call: Acute Illness, Talk to Doctor Action Taken: Provider Notified, Appt Scheduled Today Details for Reason: Patient called citing she was acutely in AFib. She is to undergo cardioversion today. Details of Complaint: Complains of Atrial Fibrillation Details of Action Taken: INR evaluated:  4.1 on 28mg /wk--held today's dose, decreased by 10% to 26mg /wk and will re-evaluate INR next Tuesday 24-May-11 while/when she is being seen by Dr. Meredith Pel (PCP). Summary of Call: Patient had INR performed emergently for cardioversion upstairs in short-stay unit. INR = 4.1 today. Last month was 2.9 Initial call taken by: Hulen Luster PharmD CACP

## 2010-07-11 NOTE — Consult Note (Signed)
Summary: [facility]  [facility]   Imported By: Margie Billet 11/18/2009 08:19:13  _____________________________________________________________________  External Attachment:    Type:   Image     Comment:   External Document

## 2010-07-11 NOTE — Cardiovascular Report (Signed)
Summary: Office Visit Remote   Office Visit Remote   Imported By: Roderic Ovens 06/22/2009 14:55:40  _____________________________________________________________________  External Attachment:    Type:   Image     Comment:   External Document

## 2010-07-11 NOTE — Progress Notes (Signed)
Summary: heart out rhythm  Phone Note Call from Patient Call back at [phone]   Caller: Patient Summary of Call: Pt heart out of rhythm Initial call taken by: Judie Grieve,  November 15, 2009 4:13 PM  Follow-up for Phone Call        spoke with pt. Pt states her heart is out of rhythm. According to pt she was cardioverted on May 16th by Dr. Ladona Ridgel. Pt states she is sure 9 out of 10 that she is out of rhythm. Pt. would like for Amber to call pt asap. Amber aware will call pt. Ollen Gross, RN, BSN  November 15, 2009 4:56 PM   Additional Follow-up for Phone Call Additional follow up Details #1::        Spoke with patient last night, pt scheduled for DCCV today with Dr Johney Frame at [facility].  Orders sent to short stay.  EP lab and cath lab aware. Gypsy Balsam RN BSN  November 16, 2009 8:46 AM

## 2010-07-11 NOTE — Progress Notes (Signed)
Summary: returning call  Phone Note Call from Patient Call back at Home Phone [phone]   Caller: Patient Reason for Call: Talk to Nurse Summary of Call: returning call, please leave info on voicemail at home Initial call taken by: Migdalia Dk,  September 29, 2009 12:27 PM  Follow-up for Phone Call        Per Dr Graciela Husbands, stop Aspirin. If pt interested in repeat AF ablation, would send to Dr Macon Large at [facility] for combination epicardial and endocardial procedure. Gypsy Balsam RN BSN  September 29, 2009 6:10 PM

## 2010-07-11 NOTE — Letter (Signed)
Summary: [facility] Medical Arrythmia Clinic Note  [facility] Medical Arrythmia Clinic Note   Imported By: Roderic Ovens 09/16/2009 16:13:14  _____________________________________________________________________  External Attachment:    Type:   Image     Comment:   External Document

## 2010-07-11 NOTE — Assessment & Plan Note (Signed)
Summary: Coumadin Clinic  Anticoagulant Therapy PCP: Margarito Liner MD [facility] Attending: Daiva Eves MD, Remi Haggard Indication 1: Atrial fibrillation Indication 2: Encounter for therapeutic drug monitoring  V58.83 Start date: 08/10/2007  Patient Assessment Reviewed by: Chancy Milroy PharmD  Oct 24, 2009 Medication review: verified warfarin dosage & schedule,verified previous prescription medications, verified doses & any changes, verified new medications, reviewed OTC medications, reviewed OTC health products-vitamins supplements etc Complications: none Dietary changes: none   Health status changes: none   Lifestyle changes: none   Recent/future hospitalizations: none   Recent/future procedures: none   Recent/future dental: none Patient Assessment Part 2:  Have you MISSED ANY DOSES or CHANGED TABLETS?  No missed Warfarin doses or changed tablets.  Have you had any BRUISING or BLEEDING ( nose or gum bleeds,blood in urine or stool)?  No reported bruising or bleeding in nose, gums, urine, stool.  Have you STARTED or STOPPED any MEDICATIONS, including OTC meds,herbals or supplements?  No other medications or herbal supplements were started or stopped.  Have you CHANGED your DIET, especially green vegetables,or ALCOHOL intake?  No changes in diet or alcohol intake.  Have you had any ILLNESSES or HOSPITALIZATIONS?  No reported illnesses or hospitalizations  Have you had any signs of CLOTTING?(chest discomfort,dizziness,shortness of breath,arms tingling,slurred speech,swelling or redness in leg)    No chest discomfort, dizziness, shortness of breath, tingling in arm, slurred speech, swelling, or redness in leg.     Treatment  Target INR: 2.0-3.0 INR: 4.1  Date: 10/24/2009 Regimen In:  28.0mg /week INR reflects regimen in: 4.1  New  Tablet strength: : 4mg  Regimen Out:     Sunday: 1 Tablet     Monday: 1 Tablet     Tuesday: 1 Tablet     Wednesday: 1/2 Tablet     Thursday: 1 Tablet  Friday: 1 Tablet     Saturday: 1 Tablet Total Weekly: 26.0mg /week mg  Next INR Due: 11/01/2009 Adjusted by: Barbera Setters. Alexandria Lodge III PharmD CACP   Return to anticoagulation clinic:  11/01/2009 Time of next visit: 1330  Hold:  1 Days     Allergies: No Known Drug Allergies

## 2010-07-11 NOTE — Progress Notes (Signed)
Summary: Pt calling regarding [facility] appt  Phone Note Call from Patient Call back at Home Phone [phone]   Summary of Call: Pt spoke with Olegario Messier from [facility] and [facility] want pt records can be faxed to [phone] ATT Olegario Messier,  pt would like a call back about this message. Initial call taken by: Judie Grieve,  Oct 18, 2009 9:13 AM  Follow-up for Phone Call        Notes refaxed through EMR. Gypsy Balsam RN BSN  Oct 18, 2009 3:33 PM

## 2010-07-11 NOTE — Letter (Signed)
Summary: Remote Device Check  [employer], Main Office  [address]   [place], [place] [address]   Phone: [phone]  Fax: [phone]     June 16, 2009 MRN: [id number]   [name] [address] [facility] [address] [place], [place]  [address]   Dear [name],   Your remote transmission was recieved and reviewed by your physician.  All diagnostics were within normal limits for you.   __X____Your next office visit is scheduled for:     APRIL 2011. Please call our office to schedule an appointment.    Sincerely,  Proofreader

## 2010-07-11 NOTE — Assessment & Plan Note (Signed)
Summary: [facility]sl   Primary Provider:  Margarito Liner MD   History of Present Illness: [name] history of followup for atrial fibrillation occurring in the setting of hypertrophic cardiomyopathy. She is status post PCI x2 has had recurrence despite ongoing therapy with dronaderone.  This is been quite frustrating for her. She was underwent cardioversion again. She know what the doctors at [facility]. I was told today that he is leaving at the end of June..    she acknowledges being sad frustrated angry and somewhat depressed     Current Medications (verified): 1)  Warfarin Sodium 4 Mg Tabs (Warfarin Sodium) .... Use As Directed By Anticoagulation Clinic 2)  Baby Aspirin 81 Mg  Chew (Aspirin) .... Take 1 Tablet By Mouth Once A Day 3)  Claritin 10 Mg  Caps (Loratadine) .... Take 1 Tablet By Mouth Once A Day As Needed 4)  Yasmin 28 3-0.03 Mg Tabs (Drospirenone-Ethinyl Estradiol) .... As Directed 5)  Cardizem Cd 120 Mg Xr24h-Cap (Diltiazem Hcl Coated Beads) .... Take 1 Tablet By Mouth Once A Day 6)  Multaq 400 Mg Tabs (Dronedarone Hcl) .... Take 1 Tablet By Mouth Two Times A Day ***note: Managed By Cardiologist Dr. Sampson Goon At [facility]***  Allergies (verified): No Known Drug Allergies  Vital Signs:  Patient profile:   45 year old female Height:      65 inches Weight:      164 pounds BMI:     27.39 Pulse rate:   68 / minute BP sitting:   105 / 68  (left arm) Cuff size:   regular  Vitals Entered By: [name], RMA (September 20, 2009 12:01 PM)  Physical Exam  General:  The patient was alert and oriented in no acute distress. HEENT Normal.  Neck veins were flat, carotids were brisk.  Lungs were clear.  Heart sounds were regular without murmurs or gallops.  Abdomen was soft with active bowel sounds. There is no clubbing cyanosis or edema. Skin Warm and dry affect is firm    ICD Specifications Following MD:  Sherryl Manges, MD     ICD Vendor:  [employer]     ICD  Model Number:  [id number]     ICD Serial Number:  [id number] ICD DOI:  09/25/2007     ICD Implanting MD:  Sherryl Manges, MD  Lead 1:    Location: RA     DOI: 09/25/2007     Model #: [id number]     Serial #: [id number]     Status: active Lead 2:    Location: RV     DOI: 09/25/2007     Model #: [id number]     Serial #: [id number]     Status: active  Indications::  HOCM   ICD Follow Up Remote Check?  No Battery Voltage:  3.20 V     Charge Time:  10.8 seconds     Battery Est. Longevity:  7.2 years Underlying rhythm:  SR ICD Dependent:  No       ICD Device Measurements Atrium:  Amplitude: 2.3 mV, Impedance: 390 ohms, Threshold: 0.5 V at 0.5 msec Right Ventricle:  Amplitude: 11.7 mV, Impedance: 480 ohms, Threshold: 1.0 V at 0.5 msec Shock Impedance: 69 ohms   Episodes MS Episodes:  1     Percent Mode Switch:  <1%     Coumadin:  Yes Shock:  0     ATP:  0     Nonsustained:  0  Atrial Pacing:  <1%     Ventricular Pacing:  <1%  Brady Parameters Mode DDD     Lower Rate Limit:  40     Upper Rate Limit 100 PAV 350     Sensed AV Delay:  350  Tachy Zones VF:  230     Next Remote Date:  12/22/2009     Next Cardiology Appt Due:  09/10/2010 Tech Comments:  No parameter changes.  1 mode switch episode 49 minutes, + coumadin.  Merlin transmissions every 3 months.  ROV 1 year with Dr. Graciela Husbands.  Checked by [employer]. Altha Harm, LPN  September 20, 2009 12:10 PM    Impression & Recommendations:  Problem # 1:  HYPERTROPHIC OBSTRUCTIVE CARDIOMYOPATHY (ICD-425.1) continue her current medication Her updated medication list for this problem includes:    Warfarin Sodium 4 Mg Tabs (Warfarin sodium) ..... Use as directed by anticoagulation clinic    Baby Aspirin 81 Mg Chew (Aspirin) .Marland Kitchen... Take 1 tablet by mouth once a day    Cardizem Cd 120 Mg Xr24h-cap (Diltiazem hcl coated beads) .Marland Kitchen... Take 1 tablet by mouth once a day    Multaq 400 Mg Tabs (Dronedarone hcl) .Marland Kitchen... Take 1 tablet by mouth two times a day ***note: managed by  cardiologist dr. Sampson Goon at [facility]***  Problem # 2:  ATRIAL FIBRILLATION (ICD-427.31) she will follow up at [facility] regarding her atrial fibrillation. I will discuss with other physicians about the role of catheter ablation in the context of hypertrophic cardio myopathy for atrial fibrillation. I also asked her to discuss with the doctors at [facility] as to whether she needs to be on aspirin in addition to warfarin Her updated medication list for this problem includes:    Warfarin Sodium 4 Mg Tabs (Warfarin sodium) ..... Use as directed by anticoagulation clinic    Baby Aspirin 81 Mg Chew (Aspirin) .Marland Kitchen... Take 1 tablet by mouth once a day    Multaq 400 Mg Tabs (Dronedarone hcl) .Marland Kitchen... Take 1 tablet by mouth two times a day ***note: managed by cardiologist dr. Sampson Goon at [facility]***  Problem # 3:  UNSPECIFIED ANEMIA (ICD-285.9) Device parameters and data were reviewed and no changes were made  Problem # 4:  VENTRICULAR TACHYCARDIA (ICD-427.1) no recurrent VT Her updated medication list for this problem includes:    Warfarin Sodium 4 Mg Tabs (Warfarin sodium) ..... Use as directed by anticoagulation clinic    Baby Aspirin 81 Mg Chew (Aspirin) .Marland Kitchen... Take 1 tablet by mouth once a day    Cardizem Cd 120 Mg Xr24h-cap (Diltiazem hcl coated beads) .Marland Kitchen... Take 1 tablet by mouth once a day    Multaq 400 Mg Tabs (Dronedarone hcl) .Marland Kitchen... Take 1 tablet by mouth two times a day ***note: managed by cardiologist dr. Sampson Goon at [facility]***

## 2010-07-11 NOTE — Assessment & Plan Note (Signed)
Summary: COU/CH  Anticoagulant Therapy PCP: Margarito Liner MD [facility] Attending: Rogelia Boga MD, Lanora Manis Indication 1: Atrial fibrillation Indication 2: Encounter for therapeutic drug monitoring  V58.83 Start date: 08/10/2007  Patient Assessment Reviewed by: Chancy Milroy PharmD  March 27, 2010 Medication review: verified warfarin dosage & schedule,verified previous prescription medications, verified doses & any changes, verified new medications, reviewed OTC medications, reviewed OTC health products-vitamins supplements etc Complications: none Dietary changes: none   Health status changes: none   Lifestyle changes: none   Recent/future hospitalizations: none   Recent/future procedures: none   Recent/future dental: none Patient Assessment Part 2:  Have you MISSED ANY DOSES or CHANGED TABLETS?  No missed Warfarin doses or changed tablets.  Have you had any BRUISING or BLEEDING ( nose or gum bleeds,blood in urine or stool)?  No reported bruising or bleeding in nose, gums, urine, stool.  Have you STARTED or STOPPED any MEDICATIONS, including OTC meds,herbals or supplements?  No other medications or herbal supplements were started or stopped.  Have you CHANGED your DIET, especially green vegetables,or ALCOHOL intake?  No changes in diet or alcohol intake.  Have you had any ILLNESSES or HOSPITALIZATIONS?  No reported illnesses or hospitalizations  Have you had any signs of CLOTTING?(chest discomfort,dizziness,shortness of breath,arms tingling,slurred speech,swelling or redness in leg)    No chest discomfort, dizziness, shortness of breath, tingling in arm, slurred speech, swelling, or redness in leg.     Treatment  Target INR: 2.0-3.0 INR: 2.0  Date: 03/27/2010 Regimen In:  32.0mg /week INR reflects regimen in: 2.0  New  Tablet strength: : 4mg  Regimen Out:     Sunday: 1 & 1/2 Tablet     Monday: 1 Tablet     Tuesday: 1 & 1/2 Tablet     Wednesday: 1 & 1/2 Tablet     Thursday: 1  Tablet      Friday: 1 & 1/2 Tablet     Saturday: 1 & 1/2 Tablet Total Weekly: 38.0mg/week mg  Next INR Due: 04/10/2010 Adjusted by: Tanyiah Laurich B. Jachelle Fluty III PharmD CACP   Return to anticoagulation clinic:  04/10/2010 Time of next visit: 1630    Allergies: No Known Drug Allergies Prescriptions: WARFARIN SODIUM 4 MG TABS (WARFARIN SODIUM) Use as directed by Anticoagulation Clinic/ Pt takes one tablet every day except Thursday when she takes 1/2 tablet  #60 x 1   Entered by:   Jay Ronnell Makarewicz PharmD   Authorized by:   Elizabeth Butcher MD   Signed by:   Jay Endre Coutts PharmD on 03/27/2010   Method used:   Electronically to        [id number] (retail)       [address]       [place], [place]  [address]       Ph: [phone]       Fax: [phone]   RxID:   [id number]

## 2010-07-11 NOTE — Letter (Signed)
Summary: Addendum - [facility] New Patient   Addendum - [facility] New [facility]   Imported By: Roderic Ovens 01/20/2010 12:44:42  _____________________________________________________________________  External Attachment:    Type:   Image     Comment:   External Document

## 2010-07-11 NOTE — Letter (Signed)
Summary: [facility]  [facility]   Imported By: Marylou Mccoy 05/01/2010 14:26:24  _____________________________________________________________________  External Attachment:    Type:   Image     Comment:   External Document

## 2010-07-11 NOTE — Letter (Signed)
Summary: Remote Device Check  [employer], Main Office  [address]   [place], [place] [address]   Phone: [phone]  Fax: [phone]     April 19, 2010 MRN: [id number]   [facility] [address] [place], [place]  [address]   Dear Ms. Scovill,   Your remote transmission was recieved and reviewed by your physician.  All diagnostics were within normal limits for you.  __X___Your next transmission is scheduled for:  06-29-2010.  Please transmit at any time this day.  If you have a wireless device your transmission will be sent automatically.    Sincerely,  Vella Kohler

## 2010-07-11 NOTE — Assessment & Plan Note (Signed)
Summary: COU/CH  Anticoagulant Therapy PCP: Margarito Liner MD [facility] Attending: Rogelia Boga MD, Lanora Manis Indication 1: Atrial fibrillation Indication 2: Encounter for therapeutic drug monitoring  V58.83 Start date: 08/10/2007  Patient Assessment Reviewed by: Chancy Milroy PharmD  January 16, 2010 Medication review: verified warfarin dosage & schedule,verified previous prescription medications, verified doses & any changes, verified new medications, reviewed OTC medications, reviewed OTC health products-vitamins supplements etc Complications: none Dietary changes: none   Health status changes: none   Lifestyle changes: none   Recent/future hospitalizations: none   Recent/future procedures: none   Recent/future dental: none Patient Assessment Part 2:  Have you MISSED ANY DOSES or CHANGED TABLETS?  No missed Warfarin doses or changed tablets.  Have you had any BRUISING or BLEEDING ( nose or gum bleeds,blood in urine or stool)?  No reported bruising or bleeding in nose, gums, urine, stool.  Have you STARTED or STOPPED any MEDICATIONS, including OTC meds,herbals or supplements?  No other medications or herbal supplements were started or stopped.  Have you CHANGED your DIET, especially green vegetables,or ALCOHOL intake?  No changes in diet or alcohol intake.  Have you had any ILLNESSES or HOSPITALIZATIONS?  No reported illnesses or hospitalizations  Have you had any signs of CLOTTING?(chest discomfort,dizziness,shortness of breath,arms tingling,slurred speech,swelling or redness in leg)    No chest discomfort, dizziness, shortness of breath, tingling in arm, slurred speech, swelling, or redness in leg.     Treatment  Target INR: 2.0-3.0 INR: 2.5  Date: 01/16/2010 Regimen In:  26.0mg /week INR reflects regimen in: 2.5  New  Tablet strength: : 4mg  Regimen Out:     Sunday: 1 Tablet     Monday: 1 Tablet     Tuesday: 1 Tablet     Wednesday: 1 Tablet     Thursday: 1 Tablet      Friday:  1 Tablet     Saturday: 1 Tablet Total Weekly: 28.0mg /week mg  Next INR Due: 01/23/2010 Adjusted by: Barbera Setters. Alexandria Lodge III PharmD CACP   Return to anticoagulation clinic:  01/23/2010 Time of next visit: 1645    Allergies: No Known Drug Allergies

## 2010-07-11 NOTE — Assessment & Plan Note (Signed)
Summary: COU/CH  Anticoagulant Therapy Managed by: Barbera Setters. Janie Morning  PharmD CACP PCP: Margarito Liner MD [facility] Attending: Rogelia Boga MD, Lanora Manis Indication 1: Atrial fibrillation Indication 2: Encounter for therapeutic drug monitoring  V58.83 Start date: 08/10/2007  Patient Assessment Reviewed by: Chancy Milroy PharmD  May 01, 2010 Medication review: verified warfarin dosage & schedule,verified previous prescription medications, verified doses & any changes, verified new medications, reviewed OTC medications, reviewed OTC health products-vitamins supplements etc Complications: none Dietary changes: none   Health status changes: none   Lifestyle changes: none   Recent/future hospitalizations: none   Recent/future procedures: none   Recent/future dental: none Patient Assessment Part 2:  Have you MISSED ANY DOSES or CHANGED TABLETS?  No missed Warfarin doses or changed tablets.  Have you had any BRUISING or BLEEDING ( nose or gum bleeds,blood in urine or stool)?  No reported bruising or bleeding in nose, gums, urine, stool.  Have you STARTED or STOPPED any MEDICATIONS, including OTC meds,herbals or supplements?  No other medications or herbal supplements were started or stopped.  Have you CHANGED your DIET, especially green vegetables,or ALCOHOL intake?  No changes in diet or alcohol intake.  Have you had any ILLNESSES or HOSPITALIZATIONS?  No reported illnesses or hospitalizations  Have you had any signs of CLOTTING?(chest discomfort,dizziness,shortness of breath,arms tingling,slurred speech,swelling or redness in leg)    No chest discomfort, dizziness, shortness of breath, tingling in arm, slurred speech, swelling, or redness in leg.     Treatment  Target INR: 2.0-3.0 INR: 2.6  Date: 05/01/2010 Regimen In:  38.0mg /week INR reflects regimen in: 2.6  New  Tablet strength: : 4mg  Regimen Out:     Sunday: 1 & 1/2 Tablet     Monday: 1 Tablet     Tuesday: 1 & 1/2 Tablet   Wednesday: 1 & 1/2 Tablet     Thursday: 1 Tablet      Friday: 1 & 1/2 Tablet     Saturday: 1 & 1/2 Tablet Total Weekly: 38.0mg /week mg  Next INR Due: 05/22/2010 Adjusted by: Barbera Setters. Alexandria Lodge III PharmD CACP   Return to anticoagulation clinic:  05/22/2010 Time of next visit: 1645    Allergies: No Known Drug Allergies

## 2010-07-11 NOTE — Letter (Signed)
Summary: [facility] Medical Arrythmia Clinic Note  [facility] Medical Arrythmia Clinic Note   Imported By: Roderic Ovens 11/28/2009 14:52:45  _____________________________________________________________________  External Attachment:    Type:   Image     Comment:   External Document

## 2010-07-11 NOTE — Progress Notes (Signed)
Summary: PREVENTIVE COLONOSCOPY  Phone Note Outgoing Call   Summary of Call: Patient is under the age of 85.  No information found about Colonoscopy at this time.  Initial call taken by: Shon Hough,  April 18, 2010 3:30 PM

## 2010-07-11 NOTE — Letter (Signed)
Summary: [facility] - EP Return Pt  [facility] - EP Return Pt   Imported By: Marylou Mccoy 05/12/2010 17:03:38  _____________________________________________________________________  External Attachment:    Type:   Image     Comment:   External Document

## 2010-07-11 NOTE — Progress Notes (Signed)
Summary: to call with info from [facility]  Phone Note Call from Patient   Caller: Patient unil 4p [phone] after 4p [phone] Reason for Call: Talk to Nurse Summary of Call: pt to call re info she got from [facility] Initial call taken by: Glynda Jaeger,  March 08, 2010 2:55 PM  Follow-up for Phone Call        pt states she spoke w/Dr Graciela Husbands over weekend and was to call him regarding info from [place].  She saw Dr Judeth Porch and he said he "was going to let things settle down" he stopped her Digoxin an increased cardizem to 180mg  two times a day, she is still on toprol 50mg  two times a day and tikosyn 250mg  two times a day, she is sch to see him back on Oct 14th, and she will call and let us know what happens after that appt, will send info to Dr Levi Aland, RN  March 08, 2010 3:01 PM

## 2010-07-11 NOTE — Assessment & Plan Note (Signed)
Summary: COU/CH  Anticoagulant Therapy PCP: Margarito Liner MD [facility] Attending: Rogelia Boga MD, Lanora Manis Indication 1: Atrial fibrillation Indication 2: Encounter for therapeutic drug monitoring  V58.83 Start date: 08/10/2007  Patient Assessment Reviewed by: Chancy Milroy PharmD  January 30, 2010 Medication review: verified warfarin dosage & schedule,verified previous prescription medications, verified doses & any changes, verified new medications, reviewed OTC medications, reviewed OTC health products-vitamins supplements etc Complications: none Dietary changes: none   Health status changes: none   Lifestyle changes: none   Recent/future hospitalizations: none   Recent/future procedures: none   Recent/future dental: none Patient Assessment Part 2:  Have you MISSED ANY DOSES or CHANGED TABLETS?  No missed Warfarin doses or changed tablets.  Have you had any BRUISING or BLEEDING ( nose or gum bleeds,blood in urine or stool)?  No reported bruising or bleeding in nose, gums, urine, stool.  Have you STARTED or STOPPED any MEDICATIONS, including OTC meds,herbals or supplements?  No other medications or herbal supplements were started or stopped.  Have you CHANGED your DIET, especially green vegetables,or ALCOHOL intake?  No changes in diet or alcohol intake.  Have you had any ILLNESSES or HOSPITALIZATIONS?  No reported illnesses or hospitalizations  Have you had any signs of CLOTTING?(chest discomfort,dizziness,shortness of breath,arms tingling,slurred speech,swelling or redness in leg)    No chest discomfort, dizziness, shortness of breath, tingling in arm, slurred speech, swelling, or redness in leg.     Treatment  Target INR: 2.0-3.0 INR: 2.1  Date: 01/30/2010 Regimen In:  28.0mg /week INR reflects regimen in: 2.1  New  Tablet strength: : 4mg  Regimen Out:     Sunday: 1 Tablet     Monday: 1 Tablet     Tuesday: 1 & 1/2 Tablet     Wednesday: 1 Tablet     Thursday: 1 Tablet  Friday: 1 Tablet     Saturday: 1 Tablet Total Weekly: 30.0mg /week mg  Next INR Due: 02/27/2010   Return to anticoagulation clinic:  02/27/2010   Comments: Gave additional 2mg  TODAY 1 time only, increased from 28mg /wk to 30mg /wk. Being seen today at [facility]. Trying to avoid need for TEE. Has HAD 4 weekly/sucessive THERAPEUTIC INRs (which they had sought/required/"requested").   Allergies: No Known Drug Allergies

## 2010-07-11 NOTE — Consult Note (Signed)
Summary: [name]: [facility]  [facility]: [facility]   Imported By: Florinda Marker 09/05/2009 10:06:00  _____________________________________________________________________  External Attachment:    Type:   Image     Comment:   External Document

## 2010-07-11 NOTE — Cardiovascular Report (Signed)
Summary: Office Visit Remote   Office Visit Remote   Imported By: Roderic Ovens 12/29/2009 10:50:11  _____________________________________________________________________  External Attachment:    Type:   Image     Comment:   External Document

## 2010-07-11 NOTE — Cardiovascular Report (Signed)
Summary: Office Visit   Office Visit   Imported By: Roderic Ovens 01/05/2010 15:00:51  _____________________________________________________________________  External Attachment:    Type:   Image     Comment:   External Document

## 2010-07-11 NOTE — Assessment & Plan Note (Signed)
Summary: COU/CH  Anticoagulant Therapy PCP: Margarito Liner MD [facility] Attending: Margarito Liner MD Indication 1: Atrial fibrillation Indication 2: Encounter for therapeutic drug monitoring  V58.83 Start date: 08/10/2007  Patient Assessment Reviewed by: Chancy Milroy PharmD  December 26, 2009 Medication review: verified warfarin dosage & schedule,verified previous prescription medications, verified doses & any changes, verified new medications, reviewed OTC medications, reviewed OTC health products-vitamins supplements etc Complications: none Dietary changes: none   Health status changes: none   Lifestyle changes: none   Recent/future hospitalizations: none   Recent/future procedures: none   Recent/future dental: none Patient Assessment Part 2:  Have you MISSED ANY DOSES or CHANGED TABLETS?  No missed Warfarin doses or changed tablets.  Have you had any BRUISING or BLEEDING ( nose or gum bleeds,blood in urine or stool)?  No reported bruising or bleeding in nose, gums, urine, stool.  Have you STARTED or STOPPED any MEDICATIONS, including OTC meds,herbals or supplements?  No other medications or herbal supplements were started or stopped.  Have you CHANGED your DIET, especially green vegetables,or ALCOHOL intake?  No changes in diet or alcohol intake.  Have you had any ILLNESSES or HOSPITALIZATIONS?  No reported illnesses or hospitalizations  Have you had any signs of CLOTTING?(chest discomfort,dizziness,shortness of breath,arms tingling,slurred speech,swelling or redness in leg)    No chest discomfort, dizziness, shortness of breath, tingling in arm, slurred speech, swelling, or redness in leg.     Treatment  Target INR: 2.0-3.0 INR: 3.9  Date: 12/26/2009 Regimen In:  28.0mg /week INR reflects regimen in: 3.9  New  Tablet strength: : 4mg  Regimen Out:     Sunday: 1 Tablet     Monday: 1 Tablet     Tuesday: 1 Tablet     Wednesday: 1 Tablet     Thursday: 1/2 Tablet      Friday: 1  Tablet     Saturday: 1 Tablet Total Weekly: 26.0mg /week mg  Next INR Due: 01/16/2010 Adjusted by: Barbera Setters. Alexandria Lodge III PharmD CACP   Return to anticoagulation clinic:  01/16/2010 Time of next visit: 1645  Hold:  1 Days     Allergies: No Known Drug Allergies

## 2010-07-11 NOTE — Assessment & Plan Note (Signed)
Summary: COU/CH  Anticoagulant Therapy PCP: Margarito Liner MD [facility] Attending: Josem Kaufmann MD, [place] Indication 1: Atrial fibrillation Indication 2: Encounter for therapeutic drug monitoring  V58.83 Start date: 08/10/2007  Patient Assessment Reviewed by: Chancy Milroy PharmD  October 03, 2009 Medication review: verified warfarin dosage & schedule,verified previous prescription medications, verified doses & any changes, verified new medications, reviewed OTC medications, reviewed OTC health products-vitamins supplements etc Complications: none Dietary changes: none   Health status changes: none   Lifestyle changes: none   Recent/future hospitalizations: none   Recent/future procedures: none   Recent/future dental: none Patient Assessment Part 2:  Have you MISSED ANY DOSES or CHANGED TABLETS?  No missed Warfarin doses or changed tablets.  Have you had any BRUISING or BLEEDING ( nose or gum bleeds,blood in urine or stool)?  No reported bruising or bleeding in nose, gums, urine, stool.  Have you STARTED or STOPPED any MEDICATIONS, including OTC meds,herbals or supplements?  No other medications or herbal supplements were started or stopped.  Have you CHANGED your DIET, especially green vegetables,or ALCOHOL intake?  No changes in diet or alcohol intake.  Have you had any ILLNESSES or HOSPITALIZATIONS?  No reported illnesses or hospitalizations  Have you had any signs of CLOTTING?(chest discomfort,dizziness,shortness of breath,arms tingling,slurred speech,swelling or redness in leg)    No chest discomfort, dizziness, shortness of breath, tingling in arm, slurred speech, swelling, or redness in leg.     Treatment  Target INR: 2.0-3.0 INR: 2.9  Date: 10/03/2009 INR reflects regimen in: 2.9  New  Tablet strength: : 4mg  Regimen Out:     Sunday: 1 Tablet     Monday: 1 Tablet     Tuesday: 1 Tablet     Wednesday: 1 Tablet     Thursday: 1 Tablet      Friday: 1 Tablet     Saturday: 1  Tablet Total Weekly: 28.0mg /week mg  Next INR Due: 11/01/2009 Adjusted by: Barbera Setters. 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 Previously managed by [facility] who have subsequently stopped operation of their anticoagulation clinic formally. Also seen by Dr. Berton Mount at [facility]. Dr. Meredith Pel is PCP. Patient will see him on 24-May-11. At that time, I will re-check her INR.   Allergies: No Known Drug Allergies

## 2010-07-11 NOTE — Progress Notes (Signed)
Summary: questions re med  Phone Note Call from Patient   Caller: Patient [phone] Reason for Call: Talk to Nurse Summary of Call: pt has questions re new med she was given, zoloft and taking heart meds Initial call taken by: Glynda Jaeger,  May 10, 2010 4:43 PM  Follow-up for Phone Call        pt states she is going on 50mg  of Zoloft and wanted to be sure that there would be no interaction w/heart meds.  Did not see any and confirmed w/pharmacy. Adv pt and assured her Dr. Graciela Husbands would see this note. Follow-up by: Claris Gladden RN,  May 10, 2010 5:05 PM    New/Updated Medications: SERTRALINE HCL 50 MG TABS (SERTRALINE HCL)

## 2010-07-11 NOTE — Letter (Signed)
Summary: Remote Device Check  [employer], Main Office  [address]   [place], [place] [address]   Phone: [phone]  Fax: [phone]     December 28, 2009 MRN: [id number]   [facility] [address] [place], [place]  [address]   Dear [name],   Your remote transmission was recieved and reviewed by your physician.  All diagnostics were within normal limits for you.  __X___Your next transmission is scheduled for:  03-30-10.  Please transmit at any time this day.  If you have a wireless device your transmission will be sent automatically.  __X____Your next office visit is scheduled for: 01-05-2010 at 1130 with Dr Graciela Husbands. Please call our office to schedule an appointment.    Sincerely,  Vella Kohler

## 2010-07-11 NOTE — Assessment & Plan Note (Signed)
Summary: CHECKUP/SB.   Vital Signs:  Patient profile:   45 year old female Height:      65 inches Weight:      163.5 pounds BMI:     27.31 Temp:     97.8 degrees F oral Pulse rate:   70 / minute BP sitting:   99 / 68  (right arm)  Vitals Entered By: Filomena Jungling NT II (April 19, 2010 1:59 PM) CC: checkup[ Is Patient Diabetic? No Pain Assessment Patient in pain? no      Nutritional Status BMI of 25 - 29 = overweight  Have you ever been in a relationship where you felt threatened, hurt or afraid?No   Does patient need assistance? Functional Status Self care Ambulation Normal   Primary Care Arika Mainer:  Margarito Liner MD  CC:  checkup[.  History of Present Illness: Patient returns for followup of her recurrent atrial flutter/atrial fibrillation, chronic warfarin therapy, and other medical problems.  She was seen in the [facility] emergency room in September with atrial flutter after undergoing multiple shocks from her ICD; her VF zone was increased from 230 beats/min to 272 beats/min and her medications were adjusted.  She was seen on the day of discharge by her electrophysiologist Dr. Macon Large at [facility], who increased her beta-blocker.  She was also started on clonazepam 0.25 mg initially b.i.d. for what was felt to be an acute stress reaction to the multiple shocks.  She has since decreased the clonazepam to once daily if needed.  She reports that she is still having episodes of palpitations but has not had further ICD discharges.  She does report being very anxious due to fear that she may have another episode of multiple ICD shocks.  She reports that she is compliant with her medications.  She has no acute complaints today.  Preventive Screening-Counseling & Management  Alcohol-Tobacco     Alcohol drinks/day: <1     Smoking Status: never  Current Medications (verified): 1)  Warfarin Sodium 4 Mg Tabs (Warfarin Sodium) .... Use As Directed By Anticoagulation Clinic 2)  Claritin  10 Mg  Caps (Loratadine) .... Take 1 Tablet By Mouth Once A Day As Needed 3)  Cardizem Cd 180 Mg Xr24h-Cap (Diltiazem Hcl Coated Beads) .... Take 1 Capsule By Mouth Two Times A Day 4)  Tikosyn 250 Mcg Caps (Dofetilide) .... Take 1 Capsule By Mouth Two Times A Day 5)  Metoprolol Succinate 50 Mg Xr24h-Tab (Metoprolol Succinate) .... Take 1 Tablet By Mouth Two Times A Day 6)  Diltiazem Hcl 30 Mg Tabs (Diltiazem Hcl) .... Take As Directed By Your Cardiologist 7)  Clonazepam 0.25 Mg Tbdp (Clonazepam) .... Take 1 Tablet By Mouth Once A Day As Needed For Anxiety  Allergies (verified): No Known Drug Allergies  Physical Exam  General:  alert, no distress Lungs:  normal respiratory effort, normal breath sounds, no crackles, and no wheezes.   Heart:  normal rate, regular rhythm, no murmur, no gallop, and no rub.   Abdomen:  soft, non-tender, and normal bowel sounds.   Extremities:  no edema   Impression & Recommendations:  Problem # 1:  ATRIAL FLUTTER (ICD-427.32) Patient's chronic arrhythmia is managed by Dr. Graciela Husbands locally and by Dr. Macon Large at [facility].  Patient is now on Tikosyn, with metoprolol and Cardizem for rate control.  Her warfarin is now being managed in our clinic.  She continues to have palpitations, but has not had further ICD discharges since her rate parameters were changed.  The following medications were removed from the medication list:    Multaq 400 Mg Tabs (Dronedarone hcl) .Marland Kitchen... Take 1 tablet by mouth two times a day ***note: managed by cardiologist dr. Sampson Goon at [facility]*** Her updated medication list for this problem includes:    Warfarin Sodium 4 Mg Tabs (Warfarin sodium) ..... Use as directed by anticoagulation clinic    Cardizem Cd 180 Mg Xr24h-cap (Diltiazem hcl coated beads) .Marland Kitchen... Take 1 capsule by mouth two times a day    Tikosyn 250 Mcg Caps (Dofetilide) .Marland Kitchen... Take 1 capsule by mouth two times a day    Metoprolol Succinate 50 Mg Xr24h-tab  (Metoprolol succinate) .Marland Kitchen... Take 1 tablet by mouth two times a day    Diltiazem Hcl 30 Mg Tabs (Diltiazem hcl) .Marland Kitchen... Take as directed by your cardiologist  Reviewed the following: PT: 24.0 (10/12/2008)   INR: 2.5 (04/10/2010) Coumadin Dose (weekly): 38.0mg /week (04/10/2010) Prior Coumadin Dose (weekly): 38.0mg /week (04/10/2010) Next Protime: 05/01/2010 (dated on 04/10/2010)  Future Orders: T-CBC w/Diff ([id number]) ... 04/20/2010 T-CMP with Estimated GFR ([id number]) ... 04/20/2010 T-Lipid Profile [id number]) ... 04/20/2010  Problem # 2:  ANXIETY (ICD-300.00) Patient reports anxiety due to fear that she will again have multiple ICD discharges.  This symptom has improved following her acute episode in September, although she still is taking clonazepam as needed.  The plan is to continue clonazepam as below.  I advised her to let me know she has any worsening of her symptoms.  She also is seeing a Veterinary surgeon.  Her updated medication list for this problem includes:    Clonazepam 0.25 Mg Tbdp (Clonazepam) .Marland Kitchen... Take 1 tablet by mouth once a day as needed for anxiety  Problem # 3:  COUMADIN THERAPY (ICD-V58.61) This is now managed in our clinic.  Complete Medication List: 1)  Warfarin Sodium 4 Mg Tabs (Warfarin sodium) .... Use as directed by anticoagulation clinic 2)  Claritin 10 Mg Caps (Loratadine) .... Take 1 tablet by mouth once a day as needed 3)  Cardizem Cd 180 Mg Xr24h-cap (Diltiazem hcl coated beads) .... Take 1 capsule by mouth two times a day 4)  Tikosyn 250 Mcg Caps (Dofetilide) .... Take 1 capsule by mouth two times a day 5)  Metoprolol Succinate 50 Mg Xr24h-tab (Metoprolol succinate) .... Take 1 tablet by mouth two times a day 6)  Diltiazem Hcl 30 Mg Tabs (Diltiazem hcl) .... Take as directed by your cardiologist 7)  Clonazepam 0.25 Mg Tbdp (Clonazepam) .... Take 1 tablet by mouth once a day as needed for anxiety  Patient Instructions: 1)  Please schedule a follow-up  appointment in 3 months. 2)  Please return for fasting lab work within 1 week.   Orders Added: 1)  T-CBC w/Diff [[id number]] 2)  T-CMP with Estimated GFR [[id number]] 3)  T-Lipid Profile [[id number]] 4)  Est. Patient Level III [52841]   Process Orders Check Orders Results:     [facility]: ABN not required for this insurance Tests Sent for requisitioning (April 21, 2010 8:05 PM):     04/20/2010: [facility] -- T-CBC w/Diff [[id number]] (signed)     04/20/2010: [facility] -- T-CMP with Estimated GFR [[id number]] (signed)     04/20/2010: [facility] -- T-Lipid Profile [id number] (signed)     Process Orders Check Orders Results:     [facility]: ABN not required for this insurance Tests Sent for requisitioning (April 21, 2010 8:05 PM):     04/20/2010: [facility] --  T-CBC w/Diff [[id number]] (signed)     04/20/2010: [facility] -- T-CMP with Estimated GFR [[id number]] (signed)     04/20/2010: [facility] -- T-Lipid Profile [phone] (signed)     Prevention & Chronic Care Immunizations   Influenza vaccine: Not documented   Influenza vaccine deferral: Refused  (04/19/2010)   Influenza vaccine due: 02/09/2010    Tetanus booster: 12/17/2006: Historical    Pneumococcal vaccine: Not documented  Other Screening   Pap smear: Satisfactory for evaluation.  Atypical Squamous Cells of undetermined significance (ASC-US).    (01/01/2008)   Pap smear due: 12/31/2008    Mammogram: Impression: BI-RADS 1: Negative.   (01/13/2010)   Mammogram due: 01/2010  Reports requested:   Last Pap report requested.  Smoking status: never  (04/19/2010)    Screening comments: Reports recent Pap in July at [facility]  Lipids   Total Cholesterol: 150  (09/06/2008)   LDL: 81  (09/06/2008)   LDL Direct: Not documented   HDL: 51   (09/06/2008)   Triglycerides: 90  (09/06/2008)   Nursing Instructions: Request report of last Pap

## 2010-07-11 NOTE — Letter (Signed)
Summary: [facility]   Imported By: Roderic Ovens 01/03/2010 16:14:35  _____________________________________________________________________  External Attachment:    Type:   Image     Comment:   External Document

## 2010-07-11 NOTE — Assessment & Plan Note (Signed)
Summary: COU/CH  Anticoagulant Therapy PCP: Margarito Liner MD [facility] Attending: Coralee Pesa MD, Levada Schilling Indication 1: Atrial fibrillation Indication 2: Encounter for therapeutic drug monitoring  V58.83 Start date: 08/10/2007  Patient Assessment Reviewed by: Chancy Milroy PharmD  January 23, 2010 Medication review: verified warfarin dosage & schedule,verified previous prescription medications, verified doses & any changes, verified new medications, reviewed OTC medications, reviewed OTC health products-vitamins supplements etc Complications: none Dietary changes: none   Health status changes: none   Lifestyle changes: none   Recent/future hospitalizations: none   Recent/future procedures: none   Recent/future dental: none Patient Assessment Part 2:  Have you MISSED ANY DOSES or CHANGED TABLETS?  No missed Warfarin doses or changed tablets.  Have you had any BRUISING or BLEEDING ( nose or gum bleeds,blood in urine or stool)?  No reported bruising or bleeding in nose, gums, urine, stool.  Have you STARTED or STOPPED any MEDICATIONS, including OTC meds,herbals or supplements?  No other medications or herbal supplements were started or stopped.  Have you CHANGED your DIET, especially green vegetables,or ALCOHOL intake?  No changes in diet or alcohol intake.  Have you had any ILLNESSES or HOSPITALIZATIONS?  No reported illnesses or hospitalizations  Have you had any signs of CLOTTING?(chest discomfort,dizziness,shortness of breath,arms tingling,slurred speech,swelling or redness in leg)    No chest discomfort, dizziness, shortness of breath, tingling in arm, slurred speech, swelling, or redness in leg.     Treatment  Target INR: 2.0-3.0 INR: 3.0  Date: 01/23/2010 Regimen In:  28.0mg /week INR reflects regimen in: 3.0  New  Tablet strength: : 4mg  Regimen Out:     Sunday: 1 Tablet     Monday: 1 Tablet     Tuesday: 1 Tablet     Wednesday: 1 Tablet     Thursday: 1 Tablet      Friday:  1 Tablet     Saturday: 1 Tablet Total Weekly: 28.0mg /week mg  Next INR Due: 01/30/2010 Adjusted by: Barbera Setters. Alexandria Lodge III PharmD CACP   Return to anticoagulation clinic:  01/30/2010 Time of next visit: 1630    Allergies: No Known Drug Allergies

## 2010-07-11 NOTE — Assessment & Plan Note (Signed)
Summary: COU/CH  Anticoagulant Therapy PCP: Margarito Liner MD [facility] Attending: Rogelia Boga MD, Lanora Manis Indication 1: Atrial fibrillation Indication 2: Encounter for therapeutic drug monitoring  V58.83 Start date: 08/10/2007  Patient Assessment Reviewed by: Chancy Milroy PharmD  November 28, 2009 Medication review: verified warfarin dosage & schedule,verified previous prescription medications, verified doses & any changes, verified new medications, reviewed OTC medications, reviewed OTC health products-vitamins supplements etc Complications: none Dietary changes: none   Health status changes: none   Lifestyle changes: none   Recent/future hospitalizations: none   Recent/future procedures: none   Recent/future dental: none Patient Assessment Part 2:  Have you MISSED ANY DOSES or CHANGED TABLETS?  No missed Warfarin doses or changed tablets.  Have you had any BRUISING or BLEEDING ( nose or gum bleeds,blood in urine or stool)?  No reported bruising or bleeding in nose, gums, urine, stool.  Have you STARTED or STOPPED any MEDICATIONS, including OTC meds,herbals or supplements?  No other medications or herbal supplements were started or stopped.  Have you CHANGED your DIET, especially green vegetables,or ALCOHOL intake?  No changes in diet or alcohol intake.  Have you had any ILLNESSES or HOSPITALIZATIONS?  No reported illnesses or hospitalizations  Have you had any signs of CLOTTING?(chest discomfort,dizziness,shortness of breath,arms tingling,slurred speech,swelling or redness in leg)    No chest discomfort, dizziness, shortness of breath, tingling in arm, slurred speech, swelling, or redness in leg.     Treatment  Target INR: 2.0-3.0 INR: 2.0  Date: 11/28/2009 Regimen In:  26.0mg /week INR reflects regimen in: 2.0  New  Tablet strength: : 4mg  Regimen Out:     Sunday: 1 Tablet     Monday: 1 Tablet     Tuesday: 1 Tablet     Wednesday: 1 Tablet     Thursday: 1 Tablet      Friday: 1  Tablet     Saturday: 1 Tablet Total Weekly: 28.0mg /week mg  Next INR Due: 12/26/2009 Adjusted by: Barbera Setters. Alexandria Lodge III PharmD CACP   Return to anticoagulation clinic:  12/26/2009 Time of next visit: 1645    Allergies: No Known Drug Allergies

## 2010-07-11 NOTE — Assessment & Plan Note (Signed)
Summary: 261/ds  Anticoagulant Therapy PCP: Margarito Liner MD [facility] Attending: Margarito Liner MD Indication 1: Atrial fibrillation Indication 2: Encounter for therapeutic drug monitoring  V58.83 Start date: 08/10/2007  Patient Assessment Reviewed by: Chancy Milroy PharmD  March 06, 2010 Medication review: verified warfarin dosage & schedule,verified previous prescription medications, verified doses & any changes, verified new medications, reviewed OTC medications, reviewed OTC health products-vitamins supplements etc Complications: none Dietary changes: none   Health status changes: none   Lifestyle changes: none   Recent/future hospitalizations: none   Recent/future procedures: none   Recent/future dental: none Patient Assessment Part 2:  Have you MISSED ANY DOSES or CHANGED TABLETS?  No missed Warfarin doses or changed tablets.  Have you had any BRUISING or BLEEDING ( nose or gum bleeds,blood in urine or stool)?  No reported bruising or bleeding in nose, gums, urine, stool.  Have you STARTED or STOPPED any MEDICATIONS, including OTC meds,herbals or supplements?  No other medications or herbal supplements were started or stopped.  Have you CHANGED your DIET, especially green vegetables,or ALCOHOL intake?  No changes in diet or alcohol intake.  Have you had any ILLNESSES or HOSPITALIZATIONS?  No reported illnesses or hospitalizations  Have you had any signs of CLOTTING?(chest discomfort,dizziness,shortness of breath,arms tingling,slurred speech,swelling or redness in leg)    No chest discomfort, dizziness, shortness of breath, tingling in arm, slurred speech, swelling, or redness in leg.     Treatment  Target INR: 2.0-3.0 INR: 2.5  Date: 03/06/2010 Regimen In:  30.0mg /week INR reflects regimen in: 2.5  New  Tablet strength: : 4mg  Regimen Out:     Sunday: 1 Tablet     Monday: 1 & 1/2 Tablet     Tuesday: 1 Tablet     Wednesday: 1 Tablet     Thursday: 1 & 1/2 Tablet   Friday: 1 Tablet     Saturday: 1 Tablet Total Weekly: 32.0mg /week mg  Next INR Due: 03/27/2010 Adjusted by: Barbera Setters. Alexandria Lodge III PharmD CACP   Return to anticoagulation clinic:  03/27/2010 Time of next visit: 1630    Allergies: No Known Drug Allergies

## 2010-07-11 NOTE — Consult Note (Signed)
[facility]   Imported By: Louretta Parma 05/11/2010 09:21:56  _____________________________________________________________________  External Attachment:    Type:   Image     Comment:   External Document

## 2010-07-11 NOTE — Consult Note (Signed)
Summary: [name]: [facility]  [facility]: [facility]   Imported By: Florinda Marker 08/01/2009 14:00:58  _____________________________________________________________________  External Attachment:    Type:   Image     Comment:   External Document

## 2010-07-11 NOTE — Assessment & Plan Note (Signed)
Summary: 261/MD APPT ALSO-COMING AT 4 PM/DS  Anticoagulant Therapy PCP: Margarito Liner MD [facility] Attending: Margarito Liner MD Indication 1: Atrial fibrillation Indication 2: Encounter for therapeutic drug monitoring  V58.83 Start date: 08/10/2007  Patient Assessment Reviewed by: Chancy Milroy PharmD  Nov 01, 2009 Medication review: verified warfarin dosage & schedule,verified previous prescription medications, verified doses & any changes, verified new medications, reviewed OTC medications, reviewed OTC health products-vitamins supplements etc Complications: none Dietary changes: none   Health status changes: none   Lifestyle changes: none   Recent/future hospitalizations: none   Recent/future procedures: none   Recent/future dental: none Patient Assessment Part 2:  Have you MISSED ANY DOSES or CHANGED TABLETS?  No missed Warfarin doses or changed tablets.  Have you had any BRUISING or BLEEDING ( nose or gum bleeds,blood in urine or stool)?  No reported bruising or bleeding in nose, gums, urine, stool.  Have you STARTED or STOPPED any MEDICATIONS, including OTC meds,herbals or supplements?  No other medications or herbal supplements were started or stopped.  Have you CHANGED your DIET, especially green vegetables,or ALCOHOL intake?  No changes in diet or alcohol intake.  Have you had any ILLNESSES or HOSPITALIZATIONS?  No reported illnesses or hospitalizations  Have you had any signs of CLOTTING?(chest discomfort,dizziness,shortness of breath,arms tingling,slurred speech,swelling or redness in leg)    No chest discomfort, dizziness, shortness of breath, tingling in arm, slurred speech, swelling, or redness in leg.     Treatment  Target INR: 2.0-3.0 INR: 2.8  Date: 11/01/2009 Regimen In:  26.0mg /week INR reflects regimen in: 2.8  New  Tablet strength: : 4mg  Regimen Out:     Sunday: 1 Tablet     Monday: 1 Tablet     Tuesday: 1 Tablet     Wednesday: 1/2 Tablet     Thursday: 1  Tablet      Friday: 1 Tablet     Saturday: 1 Tablet Total Weekly: 26.0mg /week mg  Next INR Due: 11/28/2009 Adjusted by: Barbera Setters. Alexandria Lodge III PharmD CACP   Return to anticoagulation clinic:  11/28/2009 Time of next visit: 1645    Allergies: No Known Drug Allergies

## 2010-07-13 NOTE — Consult Note (Signed)
Summary: [facility]  [facility]   Imported By: Margie Billet 05/31/2010 10:53:31  _____________________________________________________________________  External Attachment:    Type:   Image     Comment:   External Document

## 2010-07-13 NOTE — Assessment & Plan Note (Signed)
Summary: follow up/mt   Visit Type:  Follow-up Primary Provider:  Margarito Liner MD  CC:  no complaints.  History of Present Illness: Ms. Probert history of followup for atrial fibrillation occurring in the setting of hypertrophic cardiomyopathy. She is status post PVI x2 has had recurrence despite ongoing therapy with dronaderone.  She simply been treated with Tikosyn and was recently readmitted to [facility], the records of which we reviewed, and the Tikosyn dose was increased from 250-375 twice daily. There have been ongoing discussions with Dr.r Macon Large at [facility] whom she is scheduled to see again next week. They continue to discuss the possibility of catheter ablation versus convergence procedure.   She is largely symptom-free in the last 2 weeks and about this she is very excited  Apparently she also had some pacemaker mediated tachycardia which they were able to program around at [facility]    Current Medications (verified): 1)  Warfarin Sodium 4 Mg Tabs (Warfarin Sodium) .... Use As Directed By Anticoagulation Clinic 2)  Cardizem Cd 180 Mg Xr24h-Cap (Diltiazem Hcl Coated Beads) .... Take 1 Capsule By Mouth Two Times A Day 3)  Tikosyn 375 Mcg Caps (Dofetilide) .... Take One Capsule By Mouth Twice A Day 4)  Metoprolol Succinate 50 Mg Xr24h-Tab (Metoprolol Succinate) .... Take 1 Tablet By Mouth Two Times A Day 5)  Diltiazem Hcl 30 Mg Tabs (Diltiazem Hcl) .... Take As Directed By Your Cardiologist 6)  Sertraline Hcl 50 Mg Tabs (Sertraline Hcl) 7)  Xanax 0.25 Mg Tabs (Alprazolam) .... 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      ATRIAL FIBRILLATION (ICD-427.31) -- S/P radiofrequency ablation by Dr. Clydie Braun at [facility] on 02/09/2008         COUMADIN THERAPY (ICD-V58.61) -- Managed by cardiologist Dr. Chanda Busing     ATRIAL FLUTTER (ICD-427.32) -- S/P radiofrequency ablation by Dr. Clydie Braun at [facility] on 02/09/2008         Hx of PERICARDITIS, ACUTE (ICD-420.90) -- Post-procedural following radiofrequency ablation at [facility]     VENTRICULAR TACHYCARDIA (ICD-427.1) -- Hx of nonsustained ventricular tachycardia, S/P hospitalization in March 2009; S/P ICD placement by Dr. Duke Salvia on 09/25/2007.         AUTOMATIC IMPLANTABLE CARDIAC DEFIBRILLATOR SITU (ICD-V45.02) -- Placed by Dr. Duke Salvia on 09/25/2007. UNSPECIFIED ANEMIA (ICD-285.9) --  GERD (ICD-530.81) --  ALLERGIC RHINITIS, SEASONAL (ICD-477.0) --  Hx of CHOLECYSTITIS, ACUTE (ICD-575.0) -- Acute cholecystitis with choledocholithiasis 07/13/2001     CHOLECYSTECTOMY, LAPAROSCOPIC, HX OF (ICD-V45.79) -- S/P laparoscopic cholecystectomy, intraoperative cholangiogram, and transcystic common bile duct exploration by Dr. Avel Peace on 07/14/2001    Vital Signs:  Patient profile:   45 year old female Height:      65 inches Weight:      162 pounds BMI:     27.06 Pulse rate:   60 / minute BP sitting:   103 / 68  (left arm)  Vitals Entered By: Burnett Kanaris, CNA (June 15, 2010 4:11 PM)  Physical Exam  General:  The patient was alert and oriented in no acute  distress. HEENT Normal.  Neck veins were flat, carotids were brisk.  Lungs were clear.  Heart sounds were regular without murmurs or gallops.  Abdomen was soft with active bowel sounds. There is no clubbing cyanosis or edema. Skin Warm and dry    EKG  Procedure date:  06/15/2010  Findings:      atrial pacing at 60 Intervals 0.18/0.12/20 4H Axis is -38   ICD  Specifications Following MD:  Sherryl Manges, MD     ICD Vendor:  [employer]     ICD Model Number:  [id number]     ICD Serial Number:  [id number] ICD DOI:  09/25/2007     ICD Implanting MD:  Sherryl Manges, MD  Lead 1:    Location: RA     DOI: 09/25/2007     Model #: [id number]     Serial #: [id number]     Status: active Lead 2:    Location: RV     DOI: 09/25/2007     Model #: [id number]     Serial #: [id number]     Status: active  Indications::  HOCM   ICD Follow Up Battery Voltage:  2.69 V     Charge Time:  11.8 seconds     Battery Est. Longevity:  3.2-3.6 yrs Underlying rhythm:  SR ICD Dependent:  No       ICD Device Measurements Atrium:  Amplitude: 3.9 mV, Impedance: 400 ohms, Threshold: 0.5 V at 0.5 msec Right Ventricle:  Amplitude: 11.7 mV, Impedance: 390 ohms, Threshold: 1.0 V at 0.5 msec Shock Impedance: 61 ohms   Episodes MS Episodes:  1     Percent Mode Switch:  <1%     Coumadin:  Yes Shock:  0     ATP:  0     Nonsustained:  0     Atrial Therapies:  0 Atrial Pacing:  76%     Ventricular Pacing:  <1%  Brady Parameters Mode DDD     Lower Rate Limit:  60     Upper Rate Limit 100 PAV 350     Sensed AV Delay:  325  Tachy Zones VF:  272     Next Remote Date:  09/14/2010     Tech Comments:  1 AMS EPISODE LASTING 3 MINUTES 6 SECONDS.  NORMAL DEVICE FUNCTION.  NO CHANGES MADE. MERLIN 09-14-10. Vella Kohler  Impression & Recommendations:  Problem # 1:  ATRIAL FIBRILLATION (ICD-427.31) atrial fibrillation is currently managed with a combination of beta blockers calcium blockers and Tikosyn. Catheter ablation remains on the horizon Her updated medication list for this problem includes:    Warfarin Sodium 4 Mg Tabs (Warfarin sodium) ..... Use as directed by anticoagulation clinic    Metoprolol Succinate 50 Mg Xr24h-tab (Metoprolol succinate) .Marland Kitchen... Take 1 tablet by mouth two times a day  Problem # 2:  HYPERTROPHIC OBSTRUCTIVE CARDIOMYOPATHY (ICD-425.1) Gene screening was just undertaken in her mother.  Will continue on her current medications Her updated medication list for this problem includes:    Warfarin Sodium 4 Mg Tabs (Warfarin sodium) ..... Use as directed by anticoagulation clinic    Cardizem Cd 180 Mg Xr24h-cap (Diltiazem hcl coated beads) .Marland Kitchen... Take 1 capsule by mouth two times a day    Metoprolol Succinate 50 Mg Xr24h-tab (Metoprolol succinate) .Marland Kitchen... Take 1 tablet by mouth two times a day    Diltiazem Hcl 30 Mg Tabs (Diltiazem hcl) .Marland Kitchen... Take as directed by your cardiologist  Problem # 3:  VENTRICULAR TACHYCARDIA (  ICD-427.1) no recurrent VT Her updated medication list for this problem includes:    Warfarin Sodium 4 Mg Tabs (Warfarin sodium) ..... Use as directed by anticoagulation clinic    Cardizem Cd 180 Mg Xr24h-cap (Diltiazem hcl coated beads) .Marland Kitchen... Take 1 capsule by mouth two times a day    Metoprolol Succinate 50 Mg Xr24h-tab (Metoprolol succinate) .Marland Kitchen... Take 1 tablet by mouth two times a day    Diltiazem Hcl 30 Mg Tabs (Diltiazem hcl) .Marland Kitchen... Take as directed by your cardiologist  Problem # 4:  AUTOMATIC IMPLANTABLE CARDIAC DEFIBRILLATOR SITU (ICD-V45.02) Device parameters and data were reviewed and no changes were made  Patient Instructions: 1)  Your physician recommends that you continue on your current medications as directed. Please refer to the Current Medication list given to you today. 2)  Your physician wants you to follow-up in: 6 months  You will receive a reminder letter in the mail two months in advance. If you don't receive a letter, please call our office to schedule the follow-up appointment.

## 2010-07-13 NOTE — Assessment & Plan Note (Signed)
Summary: COU/CH  Anticoagulant Therapy Managed by: Barbera Setters. Janie Morning  PharmD CACP PCP: Margarito Liner MD [facility] Attending: Onalee Hua MD, Manrique Indication 1: Atrial fibrillation Indication 2: Encounter for therapeutic drug monitoring  V58.83 Start date: 08/10/2007  Patient Assessment Reviewed by: Chancy Milroy PharmD  May 22, 2010 Medication review: verified warfarin dosage & schedule,verified previous prescription medications, verified doses & any changes, verified new medications, reviewed OTC medications, reviewed OTC health products-vitamins supplements etc Complications: none Dietary changes: none   Health status changes: none   Lifestyle changes: none   Recent/future hospitalizations: none   Recent/future procedures: none   Recent/future dental: none Patient Assessment Part 2:  Have you MISSED ANY DOSES or CHANGED TABLETS?  No missed Warfarin doses or changed tablets.  Have you had any BRUISING or BLEEDING ( nose or gum bleeds,blood in urine or stool)?  No reported bruising or bleeding in nose, gums, urine, stool.  Have you STARTED or STOPPED any MEDICATIONS, including OTC meds,herbals or supplements?  No other medications or herbal supplements were started or stopped.  Have you CHANGED your DIET, especially green vegetables,or ALCOHOL intake?  No changes in diet or alcohol intake.  Have you had any ILLNESSES or HOSPITALIZATIONS?  No reported illnesses or hospitalizations  Have you had any signs of CLOTTING?(chest discomfort,dizziness,shortness of breath,arms tingling,slurred speech,swelling or redness in leg)    No chest discomfort, dizziness, shortness of breath, tingling in arm, slurred speech, swelling, or redness in leg.     Treatment  Target INR: 2.0-3.0 INR: 3.6  Date: 05/22/2010 Regimen In:  38.0mg /week INR reflects regimen in: 3.6  New  Tablet strength: : 4mg  Regimen Out:     Sunday: 1 & 1/2 Tablet     Monday: 1 Tablet     Tuesday: 1 & 1/2 Tablet  Wednesday: 1 Tablet     Thursday: 1 & 1/2 Tablet      Friday: 1 Tablet     Saturday: 1 & 1/2 Tablet Total Weekly: 36.0mg/week mg  Next INR Due: 05/29/2010 Adjusted by: James B. Groce III PharmD CACP   Return to anticoagulation clinic:  05/29/2010 Time of next visit: 1645    Allergies: No Known Drug Allergies Prescriptions: WARFARIN SODIUM 4 MG TABS (WARFARIN SODIUM) Use as directed by Anticoagulation Clinic  #40 x 2   Entered by:   Jay Groce PharmD   Authorized by:   Manrique Alvarez MD   Signed by:   Jay Groce PharmD on 05/22/2010   Method used:   Electronically to        [id number] (retail)       [address]       [place], [place]  [address]       Ph: [phone]       Fax: [phone]   RxID:   [id number]

## 2010-07-13 NOTE — Assessment & Plan Note (Signed)
Summary: COU/CH  Anticoagulant Therapy Managed by: Barbera Setters. Janie Morning  PharmD CACP PCP: Margarito Liner MD [facility] Attending: Lowella Bandy MD Indication 1: Atrial fibrillation Indication 2: Encounter for therapeutic drug monitoring  V58.83 Start date: 08/10/2007  Patient Assessment Reviewed by: Chancy Milroy PharmD  June 12, 2010 Medication review: verified warfarin dosage & schedule,verified previous prescription medications, verified doses & any changes, verified new medications, reviewed OTC medications, reviewed OTC health products-vitamins supplements etc Complications: none Dietary changes: none   Health status changes: none   Lifestyle changes: none   Recent/future hospitalizations: none   Recent/future procedures: none   Recent/future dental: none Patient Assessment Part 2:  Have you MISSED ANY DOSES or CHANGED TABLETS?  No missed Warfarin doses or changed tablets.  Have you had any BRUISING or BLEEDING ( nose or gum bleeds,blood in urine or stool)?  No reported bruising or bleeding in nose, gums, urine, stool.  Have you STARTED or STOPPED any MEDICATIONS, including OTC meds,herbals or supplements?  No other medications or herbal supplements were started or stopped.  Have you CHANGED your DIET, especially green vegetables,or ALCOHOL intake?  No changes in diet or alcohol intake.  Have you had any ILLNESSES or HOSPITALIZATIONS?  No reported illnesses or hospitalizations  Have you had any signs of CLOTTING?(chest discomfort,dizziness,shortness of breath,arms tingling,slurred speech,swelling or redness in leg)    No chest discomfort, dizziness, shortness of breath, tingling in arm, slurred speech, swelling, or redness in leg.     Treatment  Target INR: 2.0-3.0 INR: 3.2  Date: 06/12/2010 Regimen In:  36.0mg /week INR reflects regimen in: 3.2  New  Tablet strength: : 4mg  Regimen Out:     Sunday: 1 Tablet     Monday: 1 & 1/2 Tablet     Tuesday: 1 Tablet  Wednesday: 1 & 1/2 Tablet     Thursday: 1 Tablet      Friday: 1 & 1/2 Tablet     Saturday: 1 Tablet Total Weekly: 34.0mg /week mg  Next INR Due: 07/03/2010 Adjusted by: Barbera Setters. Alexandria Lodge III PharmD CACP   Return to anticoagulation clinic:  07/03/2010 Time of next visit: 1615    Allergies: No Known Drug Allergies

## 2010-07-13 NOTE — Cardiovascular Report (Signed)
Summary: Office Visit   Office Visit   Imported By: Roderic Ovens 06/16/2010 12:23:36  _____________________________________________________________________  External Attachment:    Type:   Image     Comment:   External Document

## 2010-07-13 NOTE — Letter (Signed)
[facility]   Imported By: Harlon Flor 06/06/2010 11:26:48  _____________________________________________________________________  External Attachment:    Type:   Image     Comment:   External Document

## 2010-07-13 NOTE — Miscellaneous (Signed)
Summary: Orders Update  Clinical Lists Changes  Problems: Added new problem of LONG-TERM (CURRENT) USE OF OTHER MEDICATIONS (ICD-V58.69) Orders: Added new Test order of T-Basic Metabolic Panel [id number]) - Signed     Process Orders Check Orders Results:     [facility]: ABN not required for this insurance Order queued for requisitioning for [facility]: June 21, 2010 4:10 PM Tests Sent for requisitioning (June 23, 2010 7:20 PM):     07/03/2010: [facility] -- T-Basic Metabolic Panel [phone] (signed)

## 2010-07-13 NOTE — Assessment & Plan Note (Addendum)
Summary: COU/CH  Anticoagulant Therapy Managed by: Barbera Setters. Janie Morning  PharmD CACP PCP: Margarito Liner MD [facility] Attending: Darl Pikes, Beth Indication 1: Atrial fibrillation Indication 2: Encounter for therapeutic drug monitoring  V58.83 Start date: 08/10/2007  Patient Assessment Reviewed by: Chancy Milroy PharmD  July 03, 2010 Medication review: verified warfarin dosage & schedule,verified previous prescription medications, verified doses & any changes, verified new medications, reviewed OTC medications, reviewed OTC health products-vitamins supplements etc Complications: none Dietary changes: none   Health status changes: none   Lifestyle changes: none   Recent/future hospitalizations: none   Recent/future procedures: none   Recent/future dental: none Patient Assessment Part 2:  Have you MISSED ANY DOSES or CHANGED TABLETS?  No missed Warfarin doses or changed tablets.  Have you had any BRUISING or BLEEDING ( nose or gum bleeds,blood in urine or stool)?  No reported bruising or bleeding in nose, gums, urine, stool.  Have you STARTED or STOPPED any MEDICATIONS, including OTC meds,herbals or supplements?  No other medications or herbal supplements were started or stopped.  Have you CHANGED your DIET, especially green vegetables,or ALCOHOL intake?  No changes in diet or alcohol intake.  Have you had any ILLNESSES or HOSPITALIZATIONS?  No reported illnesses or hospitalizations  Have you had any signs of CLOTTING?(chest discomfort,dizziness,shortness of breath,arms tingling,slurred speech,swelling or redness in leg)    No chest discomfort, dizziness, shortness of breath, tingling in arm, slurred speech, swelling, or redness in leg.     Treatment  Target INR: 2.0-3.0 INR: 3.2  Date: 07/03/2010 Regimen In:  34.0mg /week INR reflects regimen in: 3.2  New  Tablet strength: : 4mg  Regimen Out:     Sunday: 1 Tablet     Monday: 1 & 1/2 Tablet     Tuesday: 1 Tablet  Wednesday: 1 Tablet     Thursday: 1 & 1/2 Tablet      Friday: 1 Tablet     Saturday: 1 Tablet Total Weekly: 32.0mg /week mg  Next INR Due: 07/31/2010 Adjusted by: Barbera Setters. Alexandria Lodge III PharmD CACP   Return to anticoagulation clinic:  07/31/2010 Time of next visit: 1630    Allergies: No Known Drug Allergies

## 2010-07-14 NOTE — Letter (Signed)
Summary: [facility] New Patient   [facility] New Patient   Imported By: Roderic Ovens 12/30/2009 10:57:03  _____________________________________________________________________  External Attachment:    Type:   Image     Comment:   External Document

## 2010-07-14 NOTE — Consult Note (Signed)
[facility]   Imported By: Margie Billet 06/16/2010 14:22:23  _____________________________________________________________________  External Attachment:    Type:   Image     Comment:   External Document

## 2010-07-14 NOTE — Consult Note (Signed)
[facility]   Imported By: Louretta Parma 02/17/2010 10:30:40  _____________________________________________________________________  External Attachment:    Type:   Image     Comment:   External Document

## 2010-07-14 NOTE — Letter (Signed)
Summary: [facility]  [facility]   Imported By: Marylou Mccoy 06/15/2010 09:55:55  _____________________________________________________________________  External Attachment:    Type:   Image     Comment:   External Document

## 2010-07-15 ENCOUNTER — Inpatient Hospital Stay (HOSPITAL_COMMUNITY)
Admission: EM | Admit: 2010-07-15 | Discharge: 2010-07-17 | DRG: 138 | Disposition: A | Discharge status: Home / Self Care | Payer: BC Managed Care – PPO | Attending: Internal Medicine | Admitting: Internal Medicine

## 2010-07-15 DIAGNOSIS — K219 Gastro-esophageal reflux disease without esophagitis: Secondary | ICD-10-CM | POA: Diagnosis present

## 2010-07-15 DIAGNOSIS — I4892 Unspecified atrial flutter: Principal | ICD-10-CM | POA: Diagnosis present

## 2010-07-15 DIAGNOSIS — I472 Ventricular tachycardia, unspecified: Secondary | ICD-10-CM | POA: Diagnosis present

## 2010-07-15 DIAGNOSIS — D649 Anemia, unspecified: Secondary | ICD-10-CM | POA: Diagnosis present

## 2010-07-15 DIAGNOSIS — I4729 Other ventricular tachycardia: Secondary | ICD-10-CM | POA: Diagnosis present

## 2010-07-15 DIAGNOSIS — J309 Allergic rhinitis, unspecified: Secondary | ICD-10-CM | POA: Diagnosis present

## 2010-07-15 DIAGNOSIS — I428 Other cardiomyopathies: Secondary | ICD-10-CM | POA: Diagnosis present

## 2010-07-16 ENCOUNTER — Emergency Department (HOSPITAL_COMMUNITY): Payer: BC Managed Care – PPO

## 2010-07-16 LAB — PROTIME-INR
INR: 1.93 — ABNORMAL HIGH (ref 0.00–1.49)
Prothrombin Time: 22.2 seconds — ABNORMAL HIGH (ref 11.6–15.2)

## 2010-07-16 LAB — MAGNESIUM: Magnesium: 2.1 mg/dL (ref 1.5–2.5)

## 2010-07-16 LAB — POCT CARDIAC MARKERS
CKMB, poc: <1 ng/mL — ABNORMAL LOW (ref 1.0–8.0)
CKMB, poc: <1 ng/mL — ABNORMAL LOW (ref 1.0–8.0)
Myoglobin, poc: 37.4 ng/mL (ref 12–200)
Myoglobin, poc: 44.2 ng/mL (ref 12–200)
Troponin i, poc: <0.05 ng/mL (ref 0.00–0.09)
Troponin i, poc: <0.05 ng/mL (ref 0.00–0.09)

## 2010-07-16 LAB — HEPARIN LEVEL (UNFRACTIONATED): Heparin Unfractionated: 0.38 IU/mL (ref 0.30–0.70)

## 2010-07-16 LAB — DIFFERENTIAL
Eosinophils Relative: 2 % (ref 0–5)
Lymphocytes Relative: 24 % (ref 12–46)
Lymphs Abs: 1.9 10*3/uL (ref 0.7–4.0)
Monocytes Absolute: 0.7 10*3/uL (ref 0.1–1.0)

## 2010-07-16 LAB — CBC
HCT: 36.7 % (ref 36.0–46.0)
MCHC: 33 g/dL (ref 30.0–36.0)
MCV: 89.1 fL (ref 78.0–100.0)
RDW: 13.2 % (ref 11.5–15.5)

## 2010-07-16 LAB — BASIC METABOLIC PANEL
GFR calc non Af Amer: >60 mL/min (ref >60)
Potassium: 4.1 mEq/L (ref 3.5–5.1)
Sodium: 137 mEq/L (ref 135–145)

## 2010-07-16 LAB — APTT: aPTT: 34 seconds (ref 24–37)

## 2010-07-17 DIAGNOSIS — I4891 Unspecified atrial fibrillation: Secondary | ICD-10-CM

## 2010-07-17 DIAGNOSIS — I4892 Unspecified atrial flutter: Secondary | ICD-10-CM

## 2010-07-17 LAB — CBC
HCT: 37.7 % (ref 36.0–46.0)
Hemoglobin: 12 g/dL (ref 12.0–15.0)
RBC: 4.16 MIL/uL (ref 3.87–5.11)
RDW: 13.4 % (ref 11.5–15.5)
WBC: 6.5 10*3/uL (ref 4.0–10.5)

## 2010-07-17 LAB — PROTIME-INR
INR: 2.36 — ABNORMAL HIGH (ref 0.00–1.49)
Prothrombin Time: 25.9 seconds — ABNORMAL HIGH (ref 11.6–15.2)

## 2010-07-19 NOTE — Letter (Signed)
Summary: [facility] Inst[facility]  [facility]   Imported By: Marylou Mccoy 07/13/2010 16:05:12  _____________________________________________________________________  External Attachment:    Type:   Image     Comment:   External Document

## 2010-07-21 ENCOUNTER — Encounter: Payer: Self-pay | Admitting: Internal Medicine

## 2010-07-24 ENCOUNTER — Ambulatory Visit: Payer: Self-pay | Admitting: Pharmacist

## 2010-07-24 ENCOUNTER — Ambulatory Visit (INDEPENDENT_AMBULATORY_CARE_PROVIDER_SITE_OTHER): Payer: BC Managed Care – PPO | Admitting: Pharmacist

## 2010-07-24 DIAGNOSIS — I4891 Unspecified atrial fibrillation: Secondary | ICD-10-CM

## 2010-07-24 DIAGNOSIS — Z7901 Long term (current) use of anticoagulants: Secondary | ICD-10-CM

## 2010-07-24 NOTE — Progress Notes (Signed)
Anti-Coagulation Progress Note  [name] is a 44 y.o. female who is currently on an anti-coagulation regimen.    RECENT RESULTS: Recent results are below, the most recent result is correlated with a dose of 32 mg. per week: Lab Results  Component Value Date   INR 2.5 07/24/2010   INR 2.36* 07/17/2010   INR 1.93* 07/16/2010    ANTI-COAG DOSE:   Latest dosing instructions   Total Sun Mon Tue Wed Thu Fri Sat   32 4 mg 4 mg 6 mg 4 mg 4 mg 6 mg 4 mg    (4 mg1) (4 mg1) (4 mg1.5) (4 mg1) (4 mg1) (4 mg1.5) (4 mg1)         ANTICOAG SUMMARY: Anticoagulation Episode Summary              Current INR goal 2.0-3.0 Next INR check 08/14/2010   INR from last check 2.5 (07/24/2010)     Weekly max dose (mg)  Target end date Indefinite   Indications Long-term (current) use of anticoagulants, A-fib   INR check location [facility] Preferred lab    Send INR reminders to ANTICOAG IMP   Comments        Provider Role Specialty Phone number   Farley Ly, MD  Internal Medicine [phone]        ANTICOAG TODAY: Anticoagulation Summary as of 07/24/2010              INR goal 2.0-3.0     Selected INR 2.5 (07/24/2010) Next INR check 08/14/2010   Weekly max dose (mg)  Target end date Indefinite   Indications Long-term (current) use of anticoagulants, A-fib    Anticoagulation Episode Summary              INR check location [facility] Preferred lab    Send INR reminders to ANTICOAG IMP   Comments        Provider Role Specialty Phone number   Farley Ly, MD  Internal Medicine [phone]        PATIENT INSTRUCTIONS: Patient Instructions  Patient instructed to take medications as defined in the Anti-coagulation Track section of this encounter.  Patient instructed to take today's dose.  Patient verbalized understanding of these instructions.        FOLLOW-UP Return in 3 weeks (on 08/14/2010) for Follow up INR.  Hulen Luster, III Pharm.D., CACP

## 2010-07-24 NOTE — Patient Instructions (Signed)
Patient instructed to take medications as defined in the Anti-coagulation Track section of this encounter.  Patient instructed to take today's dose.  Patient verbalized understanding of these instructions.    

## 2010-07-27 ENCOUNTER — Ambulatory Visit (HOSPITAL_COMMUNITY)
Admission: RE | Admit: 2010-07-27 | Discharge: 2010-07-27 | Disposition: A | Discharge status: Home / Self Care | Payer: BC Managed Care – PPO | Source: Ambulatory Visit | Attending: Internal Medicine | Admitting: Internal Medicine

## 2010-07-27 ENCOUNTER — Telehealth: Payer: Self-pay | Admitting: Internal Medicine

## 2010-07-27 DIAGNOSIS — I4892 Unspecified atrial flutter: Secondary | ICD-10-CM | POA: Insufficient documentation

## 2010-07-27 DIAGNOSIS — I4891 Unspecified atrial fibrillation: Secondary | ICD-10-CM | POA: Insufficient documentation

## 2010-07-31 ENCOUNTER — Ambulatory Visit: Payer: Self-pay | Admitting: Pharmacist

## 2010-07-31 NOTE — Op Note (Signed)
  NAMESAFAA, STINGLEY                ACCOUNT NO.:  [id number]  MEDICAL RECORD NO.:  [id number]           PATIENT TYPE:  O  LOCATION:  [facility]                         FACILITY:  [facility]  PHYSICIAN:  Doylene Canning. Ladona Ridgel, MD    DATE OF BIRTH:  Mar 31, 1966  DATE OF PROCEDURE:  07/27/2010 DATE OF DISCHARGE:  07/27/2010                              OPERATIVE REPORT   PROCEDURE PERFORMED:  DC cardioversion.  INDICATIONS:  Symptomatic atrial fib/flutter.  INTRODUCTION:  The patient is a very pleasant 45 year old woman with recurrent atrial arrhythmias, status post multiple cardioversion.  She has been on Tikosyn but had recurrent atrial fib and flutter.  She awoke in rhythm but then developed atrial fibrillation/flutter and presented for DC cardioversion.  She has been fasting.  PROCEDURE:  After informed consent was obtained, the patient was prepped in the usual manner.  Dr. Katrinka Blazing of the anesthesia service provided sedation with propofol.  200 joules of synchronized biphasic energy was subsequently delivered which terminated atrial fibrillation and restored sinus rhythm.  The patient tolerated the procedure well.  There were no immediate procedure complications.     Doylene Canning. Ladona Ridgel, MD     GWT/MEDQ  D:  07/27/2010  T:  07/28/2010  Job:  [id number]  Electronically Signed by Lewayne Bunting MD on 07/31/2010 03:39:03 PM

## 2010-07-31 NOTE — Discharge Summary (Signed)
[name], [name] NO.:  [id number]  MEDICAL RECORD NO.:  [id number]           PATIENT TYPE:  LOCATION:                                 FACILITY:  PHYSICIAN:  Duke Salvia, MD, FACCDATE OF BIRTH:  [birth year]  DATE OF ADMISSION: DATE OF DISCHARGE:                              DISCHARGE SUMMARY   PRIMARY CARE PHYSICIAN:  Loyola Mast, MD  ELECTROPHYSIOLOGIST:  In Jethro Poling, MD, [facility]  CARDIOLOGIST:  At [facility], Dr. Macon Large.  DISCHARGE DIAGNOSES: 1. Post premature ventricular contraction atrial flutter.     a.     INR on admission subtherapeutic, heparin initiated,      transesophageal echocardiography/direct-current cardioversion, no      med changes, maintaining normal sinus rhythm on discharge.     b.     Status post two failed attempts at ablation and multiple      cardioversions. 2. Nonsustained ventricular tachycardia.     a.     St. Jude automatic implantable cardioverter-defibrillator      interrogated, reviewed by Dr. Graciela Husbands, no changes made.  SECONDARY DIAGNOSES: 1. Hypertrophic cardiomyopathy.     a.     A 2-D echocardiogram, February 27, 2010:  Left ventricular      cavity size normal, moderate asymmetric left ventricular      hypertrophy, normal systolic function with left ventricular      ejection fraction 55-60%, no significant left ventricular outflow,      trace gradient, normal wall motion, grade 2 diastolic dysfunction,      moderately dilated left atrium.  Pulmonary artery peak pressure      300 mmHg. 2. Gastroesophageal reflux disease. 3. History of pericarditis (following radiofrequency ablation). 4. Chronic anemia. 5. Seasonal allergic rhinitis. 6. History of acute cholecystis.     a.     Laparoscopic cholecystectomy.  ALLERGIES:  No known drug allergies.  PROCEDURES: 1. EKG, July 16, 2010:  Atrial flutter with variable conduction,     ventricular pacing intermittently, intraventricular conduction  delay, left anterior descending. 2. Chest x-ray, July 16, 2010:  No active cardiopulmonary     abnormalities. 3. Transesophageal echocardiography/direct-current cardioversion,     July 17, 2010:  Left atrial appendage large, no clot, left     atrium, right atrium without masses, tricuspid valve normal, trace     tricuspid regurgitation, mitral valve normal, trace mitral     regurgitation, aortic valve normal, no aortic insufficiency, have     ventricular severely hypertrophy, normal left ventricular function.     Right ventricle normal, implantable cardioverter-defibrillator     wire.  Aorta normal. 4. Cardioversion with successful 140-joule synchronized biphasic shock     x1, maintaining normal sinus rhythm, no apparent complications.  HISTORY OF PRESENT ILLNESS:  [name] is a 45 year old Caucasian female with a long history of atrial arrhythmias.  She reports two failed ablations in the past as well as multiple cardioversions.  She was most recently in the hospital in December 2011.  She reports never having gone back into regular rhythm without cardioversion.  Her Tikosyn dose apparently was  increased recently from 250 to 375 mg p.o. b.i.d.  She was doing well until 8:50 p.m. on the morning of July 15, 2010, when she developed her usual symptoms associated with atrial fibrillation (palpitations, irregular and rapid).  She usually paces at 60 beats per minute.  With her tachy palpitations, she also has lightheadedness but denied presyncope/syncope.  She did have some fleeting right-sided chest discomfort but no shortness of breath, PND, or orthopnea.  She has had no weight gain or edema recently.  No other complaints recently.  In the emergency department, she was treated with Cardizem but this was discontinued due to hypertension.  Her INR is noted to be subtherapeutic at 1.93.  HOSPITAL COURSE:  The patient was immediately initiated on IV heparin and she had not  converted on the morning of July 17, 2010, she subsequently underwent TEE/DCCV which luckily was successful.  Due to some nonsustained ventricular tachycardia noted in the emergency department, she had her device interrogated which was reviewed by Dr. Graciela Husbands and no changes were made.  No medication changes were made at this time.  The patient will follow up with her usual Coumadin Clinic which is here at [facility].  She will then follow up with her cardiologist at [facility] for possible changes to her preadmission medication regimen, but at this time, no changes have been made.  At the time of discharge, the patient received her old medication list, followup instructions, and all questions and concerns have been addressed prior to leaving the hospital.  DISCHARGE LABORATORY DATA:  WBC 6.5, HGB 12.0, HCT 37.7, PLT count 240. Pro time 25.9, INR 2.36.  Sodium 137, potassium 4.5, chloride 106, bicarb 26, BUN 16, creatinine 0.66, glucose 94, calcium 8.5, magnesium 2.1.  Point-of-care markers negative x1.  TSH 1.807.  FOLLOWUP PLANS AND APPOINTMENTS:  Please see hospital course.  DISCHARGE MEDICATIONS: 1. Cardizem CD 180 mg one tablet p.o. b.i.d. 2. Coumadin 4 mg 1.5 tablets on Tuesdays and Fridays and 1 tablet all     other days. 3. Tikosyn 375 mg p.o. b.i.d. 4. Toprol-XL 50 mg p.o. b.i.d. 5. Zoloft 50 mg p.o. daily.  DURATION OF DISCHARGE ENCOUNTER:  Including physician time was 35 minutes.     Jarrett Ables, PAC   ______________________________ Duke Salvia, MD, [facility]    MS/MEDQ  D:  07/17/2010  T:  07/18/2010  Job:  [id number]  cc:   Loyola Mast, MD Duke Salvia, MD, [facility] Dr. Macon Large at [facility] Signed by Jarrett Ables [facility] on 07/24/2010 11:18:10 AM Electronically Signed by Sherryl Manges MD [facility] on 07/31/2010 09:58:18 PM

## 2010-08-02 NOTE — Progress Notes (Addendum)
Summary: pt out of rhythm  Phone Note Call from Patient Call back at Home Phone [phone] P PH     Caller: Patient Reason for Call: Talk to Nurse, Talk to Doctor Summary of Call: pt heart is out of rhythm no other symptoms and should she go to hospital or dose she need to come to office Initial call taken by: Omer Jack,  July 27, 2010 8:33 AM  Follow-up for Phone Call        Phone Call Completed PER DR Graciela Husbands PT TO HAVE CARDIOVERSION SCHEDULED FOR TODAY SCHEDULED FOR 07/27/10  AT 1:30.PT AWARE. Follow-up by: Scherrie Bateman, LPN,  July 27, 2010 10:32 AM

## 2010-08-04 ENCOUNTER — Telehealth (INDEPENDENT_AMBULATORY_CARE_PROVIDER_SITE_OTHER): Payer: Self-pay | Admitting: *Deleted

## 2010-08-07 ENCOUNTER — Ambulatory Visit (INDEPENDENT_AMBULATORY_CARE_PROVIDER_SITE_OTHER): Payer: BC Managed Care – PPO | Admitting: Pharmacist

## 2010-08-07 DIAGNOSIS — I4891 Unspecified atrial fibrillation: Secondary | ICD-10-CM

## 2010-08-07 DIAGNOSIS — Z7901 Long term (current) use of anticoagulants: Secondary | ICD-10-CM

## 2010-08-07 NOTE — Progress Notes (Signed)
Anti-Coagulation Progress Note  [name] is a 45 y.o. female who is currently on an anti-coagulation regimen.    RECENT RESULTS: Recent results are below, the most recent result is correlated with a dose of 32 mg. per week: Lab Results  Component Value Date   INR 3.3 08/07/2010   INR 2.5 07/24/2010   INR 2.36* 07/17/2010    ANTI-COAG DOSE:   Latest dosing instructions   Total Sun Mon Tue Wed Thu Fri Sat   32 4 mg 4 mg 6 mg 4 mg 4 mg 6 mg 4 mg    (4 mg1) (4 mg1) (4 mg1.5) (4 mg1) (4 mg1) (4 mg1.5) (4 mg1)         ANTICOAG SUMMARY: Anticoagulation Episode Summary              Current INR goal 2.0-3.0 Next INR check 08/28/2010   INR from last check 3.3! (08/07/2010)     Weekly max dose (mg)  Target end date Indefinite   Indications Long-term (current) use of anticoagulants, A-fib   INR check location [facility] Clinic Preferred lab    Send INR reminders to [facility]   Comments        Provider Role Specialty Phone number   Farley Ly, MD  Internal Medicine [phone]        ANTICOAG TODAY: Anticoagulation Summary as of 08/07/2010              INR goal 2.0-3.0     Selected INR 3.3! (08/07/2010) Next INR check 08/28/2010   Weekly max dose (mg)  Target end date Indefinite   Indications Long-term (current) use of anticoagulants, A-fib    Anticoagulation Episode Summary              INR check location [facility] Preferred lab    Send INR reminders to [facility]   Comments        Provider Role Specialty Phone number   Farley Ly, MD  Internal Medicine [phone]        PATIENT INSTRUCTIONS: Patient Instructions  Patient instructed to take medications as defined in the Anti-coagulation Track section of this encounter.  Patient instructed to take today's dose.  Patient verbalized understanding of these instructions.        FOLLOW-UP Return in 3 weeks (on 08/28/2010) for Follow up INR.  Hulen Luster, III Pharm.D., CACP

## 2010-08-07 NOTE — Patient Instructions (Signed)
Patient instructed to take medications as defined in the Anti-coagulation Track section of this encounter.  Patient instructed to take today's dose.  Patient verbalized understanding of these instructions.    

## 2010-08-10 HISTORY — PX: RADIOFREQUENCY ABLATION: SHX2290

## 2010-08-14 ENCOUNTER — Ambulatory Visit: Payer: BC Managed Care – PPO

## 2010-08-14 ENCOUNTER — Ambulatory Visit (INDEPENDENT_AMBULATORY_CARE_PROVIDER_SITE_OTHER): Payer: BC Managed Care – PPO | Admitting: Pharmacist

## 2010-08-14 DIAGNOSIS — Z7901 Long term (current) use of anticoagulants: Secondary | ICD-10-CM

## 2010-08-14 DIAGNOSIS — I4891 Unspecified atrial fibrillation: Secondary | ICD-10-CM

## 2010-08-14 LAB — POCT INR: INR: 2.3

## 2010-08-14 NOTE — Patient Instructions (Signed)
Patient instructed to take medications as defined in the Anti-coagulation Track section of this encounter.  Patient instructed to take today's dose.  Patient verbalized understanding of these instructions.    

## 2010-08-14 NOTE — Progress Notes (Signed)
Anti-Coagulation Progress Note  [name] is a 45 y.o. female who is currently on an anti-coagulation regimen.    RECENT RESULTS: Recent results are below, the most recent result is correlated with a dose of 32 mg. per week: Lab Results  Component Value Date   INR 2.3 08/14/2010   INR 3.3 08/07/2010   INR 2.5 07/24/2010    ANTI-COAG DOSE:   Latest dosing instructions   Total Sun Mon Tue Wed Thu Fri Sat   34 4 mg 6 mg 6 mg 4 mg 4 mg 6 mg 4 mg    (4 mg1) (4 mg1.5) (4 mg1.5) (4 mg1) (4 mg1) (4 mg1.5) (4 mg1)         ANTICOAG SUMMARY: Anticoagulation Episode Summary              Current INR goal 2.0-3.0 Next INR check 08/28/2010   INR from last check 2.3 (08/14/2010)     Weekly max dose (mg)  Target end date Indefinite   Indications Long-term (current) use of anticoagulants, A-fib   INR check location [facility] Preferred lab    Send INR reminders to [facility]   Comments        Provider Role Specialty Phone number   Farley Ly, MD  Internal Medicine [phone]        ANTICOAG TODAY: Anticoagulation Summary as of 08/14/2010              INR goal 2.0-3.0     Selected INR 2.3 (08/14/2010) Next INR check 08/28/2010   Weekly max dose (mg)  Target end date Indefinite   Indications Long-term (current) use of anticoagulants, A-fib    Anticoagulation Episode Summary              INR check location [facility] Preferred lab    Send INR reminders to [facility]   Comments        Provider Role Specialty Phone number   Farley Ly, MD  Internal Medicine [phone]        PATIENT INSTRUCTIONS: Patient Instructions  Patient instructed to take medications as defined in the Anti-coagulation Track section of this encounter.  Patient instructed to take today's dose.  Patient verbalized understanding of these instructions.        FOLLOW-UP Return in 2 weeks (on 08/28/2010) for Follow up INR.  Hulen Luster, III Pharm.D., CACP

## 2010-08-17 NOTE — Letter (Signed)
Summary: EP Return Patient  EP Return Patient   Imported By: Marylou Mccoy 08/10/2010 15:34:02  _____________________________________________________________________  External Attachment:    Type:   Image     Comment:   External Document

## 2010-08-17 NOTE — Progress Notes (Signed)
  Phone Note Outgoing Call   Call placed by: Scherrie Bateman, LPN,  August 04, 2010 8:37 AM Call placed to: Patient Summary of Call: CALL PLACED TO PT RE NEEDING TO SCHEDULE STRESS ECHO AND ECHO PER ORDER FROM [facility] PER PT WAS TOLD ONLY NEEDED ECHO CALL OUT TO Chrissie Noa COCKFIELD PAC AT [facility] FOR VERIFICATION. Initial call taken by: Scherrie Bateman, LPN,  August 04, 2010 8:38 AM  Follow-up for Phone Call        Phone call completed PT AWARE PER WILLIAM COCKFIELD PAC NO ECHO OR STRESS /ECHO NEEDED AT THIS TIME.   [facility]  [phone] FAX NUMBER [phone] Follow-up by: Scherrie Bateman, LPN,  August 04, 2010 4:18 PM

## 2010-08-18 NOTE — Discharge Summary (Signed)
  NAMELYNDSEY, DEMOS NO.:  [id number]  MEDICAL RECORD NO.:  [id number]           PATIENT TYPE:  I  LOCATION:  3705                         FACILITY:  [facility]  PHYSICIAN:  Pricilla Riffle, MD, FACCDATE OF BIRTH:  [birth year]  DATE OF ADMISSION:  07/15/2010 DATE OF DISCHARGE:  07/17/2010                              DISCHARGE SUMMARY   Cardioversion.  The patient is a 45 year old with history of hypertrophic cardiomyopathy, ICD with frequent firings and atrial flutter.  The patient is to undergo DC cardioversion.  TEE done prior to procedure showed no left atrial thrombus.  The patient was previously sedated for the TEE.  In addition, she was anesthetized per Anesthesia.  With pads in the AP position, the patient was cardioverted to sinus rhythm.  With 150 joules synchronized biphasic energy, she quickly developed more atrial flutter then reverted to sinus, which she maintained.  Procedure without complication.  Device was interrogated with no changes made.     Pricilla Riffle, MD, [facility]     PVR/MEDQ  D:  07/17/2010  T:  07/18/2010  Job:  [id number]  Electronically Signed by Dietrich Pates MD [facility] on 08/17/2010 02:12:44 PM

## 2010-08-21 LAB — CBC
HCT: 37.7 % (ref 36.0–46.0)
HCT: 40.7 % (ref 36.0–46.0)
MCHC: 31.7 g/dL (ref 30.0–36.0)
MCV: 90.4 fL (ref 78.0–100.0)
RBC: 4.17 MIL/uL (ref 3.87–5.11)
RDW: 13.5 % (ref 11.5–15.5)
WBC: 9.1 10*3/uL (ref 4.0–10.5)

## 2010-08-21 LAB — PROTIME-INR
INR: 3.34 — ABNORMAL HIGH (ref 0.00–1.49)
Prothrombin Time: 46 seconds — ABNORMAL HIGH (ref 11.6–15.2)

## 2010-08-21 LAB — APTT: aPTT: 37 seconds (ref 24–37)

## 2010-08-21 LAB — POCT I-STAT, CHEM 8
BUN: 9 mg/dL (ref 6–23)
Calcium, Ion: 1.14 mmol/L (ref 1.12–1.32)
Creatinine, Ser: 0.9 mg/dL (ref 0.4–1.2)
Glucose, Bld: 97 mg/dL (ref 70–99)
TCO2: 28 mmol/L (ref 0–100)

## 2010-08-21 LAB — DIFFERENTIAL
Basophils Absolute: 0 10*3/uL (ref 0.0–0.1)
Basophils Relative: 0 % (ref 0–1)
Eosinophils Relative: 1 % (ref 0–5)
Monocytes Absolute: 0.8 10*3/uL (ref 0.1–1.0)
Neutro Abs: 7.9 10*3/uL — ABNORMAL HIGH (ref 1.7–7.7)

## 2010-08-21 LAB — BASIC METABOLIC PANEL
Chloride: 109 mEq/L (ref 96–112)
GFR calc Af Amer: >60 mL/min (ref >60)
Potassium: 4 mEq/L (ref 3.5–5.1)
Sodium: 143 mEq/L (ref 135–145)

## 2010-08-21 LAB — BRAIN NATRIURETIC PEPTIDE: Pro B Natriuretic peptide (BNP): 734 pg/mL — ABNORMAL HIGH (ref 0.0–100.0)

## 2010-08-21 LAB — MRSA PCR SCREENING: MRSA by PCR: NEGATIVE

## 2010-08-22 NOTE — Consult Note (Signed)
Summary: Consultation Report  Consultation Report   Imported By: Earl Many 08/11/2010 10:54:19  _____________________________________________________________________  External Attachment:    Type:   Image     Comment:   External Document

## 2010-08-23 LAB — POCT I-STAT, CHEM 8
BUN: 12 mg/dL (ref 6–23)
Chloride: 106 mEq/L (ref 96–112)
Creatinine, Ser: 1 mg/dL (ref 0.4–1.2)
Potassium: 3.8 mEq/L (ref 3.5–5.1)
Sodium: 140 mEq/L (ref 135–145)

## 2010-08-23 LAB — MAGNESIUM: Magnesium: 2 mg/dL (ref 1.5–2.5)

## 2010-08-23 LAB — POCT CARDIAC MARKERS
CKMB, poc: <1 ng/mL — ABNORMAL LOW (ref 1.0–8.0)
Myoglobin, poc: 34.7 ng/mL (ref 12–200)
Troponin i, poc: 0.08 ng/mL (ref 0.00–0.09)

## 2010-08-24 LAB — CARDIAC PANEL(CRET KIN+CKTOT+MB+TROPI)
CK, MB: 14.2 ng/mL (ref 0.3–4.0)
CK, MB: 9.4 ng/mL (ref 0.3–4.0)
Relative Index: 0.5 (ref 0.0–2.5)
Total CK: 1114 U/L — ABNORMAL HIGH (ref 7–177)
Total CK: 3107 U/L — ABNORMAL HIGH (ref 7–177)
Troponin I: 0.18 ng/mL — ABNORMAL HIGH (ref 0.00–0.06)
Troponin I: 0.21 ng/mL — ABNORMAL HIGH (ref 0.00–0.06)

## 2010-08-24 LAB — COMPREHENSIVE METABOLIC PANEL
BUN: 9 mg/dL (ref 6–23)
CO2: 25 mEq/L (ref 19–32)
Calcium: 9.1 mg/dL (ref 8.4–10.5)
Creatinine, Ser: 0.78 mg/dL (ref 0.4–1.2)
GFR calc non Af Amer: >60 mL/min (ref >60)
Glucose, Bld: 141 mg/dL — ABNORMAL HIGH (ref 70–99)
Sodium: 142 mEq/L (ref 135–145)
Total Protein: 6.1 g/dL (ref 6.0–8.3)

## 2010-08-24 LAB — POCT CARDIAC MARKERS
CKMB, poc: 2.7 ng/mL (ref 1.0–8.0)
Troponin i, poc: <0.05 ng/mL (ref 0.00–0.09)

## 2010-08-24 LAB — DIFFERENTIAL
Basophils Absolute: 0 10*3/uL (ref 0.0–0.1)
Lymphocytes Relative: 8 % — ABNORMAL LOW (ref 12–46)
Lymphs Abs: 1.1 10*3/uL (ref 0.7–4.0)
Neutrophils Relative %: 85 % — ABNORMAL HIGH (ref 43–77)

## 2010-08-24 LAB — POCT I-STAT, CHEM 8
Calcium, Ion: 1.15 mmol/L (ref 1.12–1.32)
Chloride: 107 mEq/L (ref 96–112)
Creatinine, Ser: 0.8 mg/dL (ref 0.4–1.2)
Glucose, Bld: 104 mg/dL — ABNORMAL HIGH (ref 70–99)
HCT: 39 % (ref 36.0–46.0)
Potassium: 3.3 mEq/L — ABNORMAL LOW (ref 3.5–5.1)

## 2010-08-24 LAB — CBC
Platelets: 294 10*3/uL (ref 150–400)
RBC: 4.05 MIL/uL (ref 3.87–5.11)
RDW: 13.8 % (ref 11.5–15.5)
WBC: 13.6 10*3/uL — ABNORMAL HIGH (ref 4.0–10.5)

## 2010-08-24 LAB — PROTIME-INR
INR: 1.54 — ABNORMAL HIGH (ref 0.00–1.49)
Prothrombin Time: 18.7 seconds — ABNORMAL HIGH (ref 11.6–15.2)
Prothrombin Time: 21.4 seconds — ABNORMAL HIGH (ref 11.6–15.2)

## 2010-08-24 LAB — TSH: TSH: 1.857 u[IU]/mL (ref 0.350–4.500)

## 2010-08-24 LAB — CK TOTAL AND CKMB (NOT AT ARMC)
CK, MB: 4.8 ng/mL — ABNORMAL HIGH (ref 0.3–4.0)
Relative Index: 3.1 — ABNORMAL HIGH (ref 0.0–2.5)

## 2010-08-24 NOTE — H&P (Signed)
[name], [name] NO.:  [id number]  MEDICAL RECORD NO.:  [id number]           PATIENT TYPE:  LOCATION:                                 FACILITY:  PHYSICIAN:  Rollene Rotunda, MD, FACCDATE OF BIRTH:  1965-07-06  DATE OF ADMISSION:  07/16/2010 DATE OF DISCHARGE:                             HISTORY & PHYSICAL   PRIMARY:  Loyola Mast, MD.  CARDIOLOGIST:  Duke Salvia, MD, [facility].  REASON FOR PRESENTATION:  The patient with atrial flutter.  HISTORY OF PRESENT ILLNESS:  The patient is a 45 year old white female with a long history of atrial arrhythmias.  She reports 2 ablations in the past, which have failed.  She had multiple cardioversions.  The last was in this hospital in December of last year.  She reports she has never gone back into regular rhythm without cardioversion.  After  the last [facility] admission she was at [facility] and had her dose of Tikosyn increased.  She has been doing well until 8:50 this evening when she developed her usual symptoms of fibrillation.  She felt her usual palpitations.  Her heart was irregular and rapid.  He usually paces is at 60.  She feels lightheaded with this.  She did not have any presyncope or syncope.  She did have some fleeting right-sided chest discomfort.  She had no shortness of breath, PND, or orthopnea.  She had no weight gain or edema recently.  She is otherwise has been feeling well.  She was initially treated in the emergency room with Cardizem, but this has been discontinued as her blood pressure dropped. Unfortunately her INR is 1.93.  PAST MEDICAL HISTORY:  Hypertrophic cardiomyopathy, atrial flutter/fibrillation, nonsustained ventricular tachycardia, AICD (secondary to an SVT in a family history of sudden death), gastroesophageal reflux disease.  PAST SURGICAL HISTORY:  ICD (St. Jude's), cholecystectomy.  ALLERGIES:  NONE.  MEDICATIONS: 1. Cardizem CD 180 mg b.i.d. 2. Coumadin. 3. Toprol-XL 50  mg b.i.d. 4. Fish oil. 5. Sertraline. 6. Tikosyn 375 mcg b.i.d.  SOCIAL HISTORY:  The patient does not smoke cigarettes or drink alcohol. She is here with her friend.  FAMILY HISTORY:  Contributory for mother's first cousin with sudden cardiac death and other more distant relatives with sudden death.  REVIEW OF SYSTEMS:  Positive for very mild nausea this morning. Otherwise negative for all other systems.  PHYSICAL EXAMINATION:  GENERAL:  The patient is pleasant and in no distress. VITAL SIGNS:  Blood pressure 105/79, heart rate 70 and regular, respiratory rate 16, afebrile. HEENT:  Eyelids are unremarkable, pupils equal, round, and reactive to light, fundi not visualized, oral mucosa unremarkable. NECK:  No jugular distention of 45 degrees, carotid upstroke brisk and symmetric, no bruits, no thyromegaly. LYMPHATICS:  No cervical, axillary, or inguinal adenopathy. LUNGS:  Clear to auscultation bilaterally. BACK:  No costovertebral angle tenderness. CHEST:  Well-healed left ICD pocket. HEART:  PMI not displaced or sustained, S1 and S2 within normal limits, no S3, no S4, no clicks, no rubs, no murmurs.  ABDOMEN:  Flat, positive bowel sounds normal in frequency and pitch, no bruits, no  rebound, no guarding, no midline pulsatile mass, no hepatomegaly, no splenomegaly. SKIN:  No rashes, no nodules. EXTREMITIES:  2+ pulses throughout, no edema, no cyanosis or clubbing. NEURO:  Oriented to person, place and time, cranial nerves II-XII grossly intact, motor grossly intact.  EKG:  EKG atrial flutter with variable conduction, ventricular pacing intermittently, interventricular conduction delay, left axis deviation.  LABS:  Sodium 137, potassium 4.1, BUN 16, creatinine 0.66, WBC 8.1, hemoglobin 12.1, platelets 243, INR 1.93, magnesium 2.1.  Chest x-ray no acute disease.  ASSESSMENT/PLAN: 1. Atrial flutter.  The patient is back in her recurrent arrhythmias.     She never converted  without cardioversion.  Unfortunately, she has     an INR less than two.  I am going to start on heparin and have     Coumadin dose per pharmacy.  She will need TE cardioversion unless     Dr. Graciela Husbands feels comfortable, otherwise.  She will continue on her     current dose of Tikosyn.  If she could afford it Pradaxa might be     an excellent choice to avoid problems with subtherapeutic INRs in     the future. 2. Nonsustained ventricular tachycardia.  She will have to have her     device interrogated per protocol following cardioversion.     Rollene Rotunda, MD, [facility]     JH/MEDQ  D:  07/16/2010  T:  07/16/2010  Job:  [id number]  Electronically Signed by Rollene Rotunda MD [facility] on 08/24/2010 09:54:26 AM

## 2010-08-28 ENCOUNTER — Ambulatory Visit (INDEPENDENT_AMBULATORY_CARE_PROVIDER_SITE_OTHER): Payer: BC Managed Care – PPO | Admitting: Pharmacist

## 2010-08-28 DIAGNOSIS — I4891 Unspecified atrial fibrillation: Secondary | ICD-10-CM

## 2010-08-28 DIAGNOSIS — Z7901 Long term (current) use of anticoagulants: Secondary | ICD-10-CM

## 2010-08-28 LAB — CBC
HCT: 36.8 % (ref 36.0–46.0)
HCT: 37.3 % (ref 36.0–46.0)
Hemoglobin: 12.3 g/dL (ref 12.0–15.0)
MCHC: 34.3 g/dL (ref 30.0–36.0)
MCV: 87.7 fL (ref 78.0–100.0)
MCV: 87.8 fL (ref 78.0–100.0)
Platelets: 272 10*3/uL (ref 150–400)
Platelets: 251 10*3/uL (ref 150–400)
RBC: 4.25 MIL/uL (ref 3.87–5.11)
RDW: 14.6 % (ref 11.5–15.5)
WBC: 8.7 10*3/uL (ref 4.0–10.5)

## 2010-08-28 LAB — BASIC METABOLIC PANEL
BUN: 8 mg/dL (ref 6–23)
BUN: 6 mg/dL (ref 6–23)
CO2: 22 mEq/L (ref 19–32)
CO2: 23 mEq/L (ref 19–32)
Chloride: 109 mEq/L (ref 96–112)
Chloride: 108 mEq/L (ref 96–112)
GFR calc non Af Amer: >60 mL/min (ref >60)
Glucose, Bld: 73 mg/dL (ref 70–99)
Potassium: 3.9 mEq/L (ref 3.5–5.1)
Potassium: 3.7 mEq/L (ref 3.5–5.1)
Sodium: 138 mEq/L (ref 135–145)

## 2010-08-28 LAB — APTT: aPTT: 30 seconds (ref 24–37)

## 2010-08-28 NOTE — Progress Notes (Signed)
Anti-Coagulation Progress Note  [name] is a 45 y.o. female who is currently on an anti-coagulation regimen.    RECENT RESULTS: Recent results are below, the most recent result is correlated with a dose of 34 mg. per week: Lab Results  Component Value Date   INR 2.7 08/28/2010   INR 2.3 08/14/2010   INR 3.3 08/07/2010    ANTI-COAG DOSE:   Latest dosing instructions   Total Sun Mon Tue Wed Thu Fri Sat   34 4 mg 6 mg 6 mg 4 mg 4 mg 6 mg 4 mg    (4 mg1) (4 mg1.5) (4 mg1.5) (4 mg1) (4 mg1) (4 mg1.5) (4 mg1)         ANTICOAG SUMMARY: Anticoagulation Episode Summary              Current INR goal 2.0-3.0 Next INR check 09/25/2010   INR from last check 2.7 (08/28/2010)     Weekly max dose (mg)  Target end date Indefinite   Indications Long-term (current) use of anticoagulants, A-fib   INR check location [facility] Preferred lab    Send INR reminders to [facility]   Comments        Provider Role Specialty Phone number   Farley Ly, MD  Internal Medicine [phone]        ANTICOAG TODAY: Anticoagulation Summary as of 08/28/2010              INR goal 2.0-3.0     Selected INR 2.7 (08/28/2010) Next INR check 09/25/2010   Weekly max dose (mg)  Target end date Indefinite   Indications Long-term (current) use of anticoagulants, A-fib    Anticoagulation Episode Summary              INR check location [facility] Preferred lab    Send INR reminders to [facility]   Comments        Provider Role Specialty Phone number   Farley Ly, MD  Internal Medicine [phone]        PATIENT INSTRUCTIONS: Patient Instructions  Patient instructed to take medications as defined in the Anti-coagulation Track section of this encounter.  Patient instructed to take today's dose.  Patient verbalized understanding of these instructions.        FOLLOW-UP Return in 4 weeks (on 09/25/2010) for Follow up INR.  Hulen Luster, III Pharm.D., CACP

## 2010-08-28 NOTE — Progress Notes (Deleted)
Patient instructed to take medications as defined in the Anti-coagulation Track section of this encounter.  Patient instructed to take today's dose.  Patient verbalized understanding of these instructions.    

## 2010-08-28 NOTE — Patient Instructions (Signed)
Patient instructed to take medications as defined in the Anti-coagulation Track section of this encounter.  Patient instructed to take today's dose.  Patient verbalized understanding of these instructions.    

## 2010-08-30 LAB — CBC
HCT: 39.6 % (ref 36.0–46.0)
Hemoglobin: 13.3 g/dL (ref 12.0–15.0)
MCHC: 33.6 g/dL (ref 30.0–36.0)
RBC: 4.5 MIL/uL (ref 3.87–5.11)
RDW: 14.3 % (ref 11.5–15.5)

## 2010-08-30 LAB — BASIC METABOLIC PANEL
BUN: 10 mg/dL (ref 6–23)
Chloride: 109 mEq/L (ref 96–112)
Creatinine, Ser: 1.01 mg/dL (ref 0.4–1.2)
Glucose, Bld: 94 mg/dL (ref 70–99)
Potassium: 4 mEq/L (ref 3.5–5.1)

## 2010-08-30 LAB — APTT: aPTT: 28 seconds (ref 24–37)

## 2010-08-30 LAB — PROTIME-INR: INR: 1.9 — ABNORMAL HIGH (ref 0.00–1.49)

## 2010-08-31 ENCOUNTER — Ambulatory Visit (INDEPENDENT_AMBULATORY_CARE_PROVIDER_SITE_OTHER): Payer: BC Managed Care – PPO | Admitting: Pharmacist

## 2010-08-31 DIAGNOSIS — I4891 Unspecified atrial fibrillation: Secondary | ICD-10-CM

## 2010-08-31 DIAGNOSIS — Z7901 Long term (current) use of anticoagulants: Secondary | ICD-10-CM

## 2010-08-31 LAB — POCT INR: INR: 3.5

## 2010-08-31 NOTE — Patient Instructions (Signed)
Patient instructed to take medications as defined in the Anti-coagulation Track section of this encounter.  Patient instructed to take today's dose.  Patient verbalized understanding of these instructions.    

## 2010-08-31 NOTE — Progress Notes (Signed)
Anti-Coagulation Progress Note  [name] is a 45 y.o. female who is currently on an anti-coagulation regimen.    RECENT RESULTS: Recent results are below, the most recent result is correlated with a dose of 34 mg. per week: Lab Results  Component Value Date   INR 3.5 08/31/2010   INR 3.5 08/31/2010   INR 2.7 08/28/2010    ANTI-COAG DOSE:   Latest dosing instructions   Total Sun Mon Tue Wed Thu Fri Sat   32 4 mg 6 mg 4 mg 4 mg 6 mg 4 mg 4 mg    (4 mg1) (4 mg1.5) (4 mg1) (4 mg1) (4 mg1.5) (4 mg1) (4 mg1)         ANTICOAG SUMMARY: Anticoagulation Episode Summary              Current INR goal 2.0-3.0 Next INR check 09/18/2010   INR from last check 3.5! (08/31/2010)     Weekly max dose (mg)  Target end date Indefinite   Indications Long-term (current) use of anticoagulants, A-fib   INR check location [facility] Preferred lab    Send INR reminders to [facility]   Comments        Provider Role Specialty Phone number   Farley Ly, MD  Internal Medicine [phone]        ANTICOAG TODAY: Anticoagulation Summary as of 08/31/2010              INR goal 2.0-3.0     Selected INR 3.5! (08/31/2010) Next INR check 09/18/2010   Weekly max dose (mg)  Target end date Indefinite   Indications Long-term (current) use of anticoagulants, A-fib    Anticoagulation Episode Summary              INR check location [facility] Preferred lab    Send INR reminders to [facility]   Comments        Provider Role Specialty Phone number   Farley Ly, MD  Internal Medicine [phone]        PATIENT INSTRUCTIONS: Patient Instructions  Patient instructed to take medications as defined in the Anti-coagulation Track section of this encounter.  Patient instructed to take today's dose.  Patient verbalized understanding of these instructions.        FOLLOW-UP Return in 3 weeks (on 09/18/2010) for Follow up INR.  Hulen Luster, III Pharm.D., CACP

## 2010-09-13 ENCOUNTER — Other Ambulatory Visit: Payer: Self-pay | Admitting: Internal Medicine

## 2010-09-13 DIAGNOSIS — I4891 Unspecified atrial fibrillation: Secondary | ICD-10-CM

## 2010-09-13 DIAGNOSIS — Z7901 Long term (current) use of anticoagulants: Secondary | ICD-10-CM

## 2010-09-13 NOTE — Progress Notes (Signed)
Patient is scheduled to see Dr. Macon Large at [facility] this Friday for cardioversion and he requested a basic metabolic panel and INR be done here and forwarded to him prior to that procedure.  Labs ordered as requested.

## 2010-09-14 ENCOUNTER — Ambulatory Visit (INDEPENDENT_AMBULATORY_CARE_PROVIDER_SITE_OTHER): Payer: BC Managed Care – PPO | Admitting: Pharmacist

## 2010-09-14 ENCOUNTER — Other Ambulatory Visit: Payer: BC Managed Care – PPO

## 2010-09-14 DIAGNOSIS — I4891 Unspecified atrial fibrillation: Secondary | ICD-10-CM

## 2010-09-14 DIAGNOSIS — Z7901 Long term (current) use of anticoagulants: Secondary | ICD-10-CM

## 2010-09-14 LAB — BASIC METABOLIC PANEL WITH GFR
CO2: 30 mEq/L (ref 19–32)
Calcium: 9 mg/dL (ref 8.4–10.5)
GFR, Est African American: >60 mL/min (ref >60)
Potassium: 4.1 mEq/L (ref 3.5–5.3)
Sodium: 143 mEq/L (ref 135–145)

## 2010-09-14 LAB — POCT INR: INR: 3.8

## 2010-09-14 NOTE — Progress Notes (Signed)
Anti-Coagulation Progress Note  [name] is a 45 y.o. female who is currently on an anti-coagulation regimen.    RECENT RESULTS: Recent results are below, the most recent result is correlated with a dose of 32 mg. per week: Lab Results  Component Value Date   INR 3.8 09/14/2010   INR 3.5 08/31/2010   INR 3.5 08/31/2010    ANTI-COAG DOSE:   Latest dosing instructions   Total Sun Mon Tue Wed Thu Fri Sat   30 4 mg 6 mg 4 mg 4 mg 4 mg 4 mg 4 mg    (4 mg1) (4 mg1.5) (4 mg1) (4 mg1) (4 mg1) (4 mg1) (4 mg1)         ANTICOAG SUMMARY: Anticoagulation Episode Summary              Current INR goal 2.0-3.0 Next INR check 09/18/2010   INR from last check 3.8! (09/14/2010)     Weekly max dose (mg)  Target end date Indefinite   Indications Long-term (current) use of anticoagulants, A-fib   INR check location [facility] Preferred lab    Send INR reminders to [facility]   Comments        Provider Role Specialty Phone number   Farley Ly, MD  Internal Medicine [phone]        ANTICOAG TODAY: Anticoagulation Summary as of 09/14/2010              INR goal 2.0-3.0     Selected INR 3.8! (09/14/2010) Next INR check 09/18/2010   Weekly max dose (mg)  Target end date Indefinite   Indications Long-term (current) use of anticoagulants, A-fib    Anticoagulation Episode Summary              INR check location [facility] Preferred lab    Send INR reminders to [facility]   Comments        Provider Role Specialty Phone number   Farley Ly, MD  Internal Medicine [phone]        PATIENT INSTRUCTIONS: Patient Instructions  Patient instructed to take medications as defined in the Anti-coagulation Track section of this encounter.  Patient instructed to take today's dose.  Patient verbalized understanding of these instructions.        FOLLOW-UP Return in 4 days (on 09/18/2010) for Follow up INR.  Hulen Luster, III Pharm.D., CACP

## 2010-09-14 NOTE — Patient Instructions (Signed)
Patient instructed to take medications as defined in the Anti-coagulation Track section of this encounter.  Patient instructed to take today's dose.  Patient verbalized understanding of these instructions.    

## 2010-09-15 LAB — BASIC METABOLIC PANEL
Calcium: 8.9 mg/dL (ref 8.4–10.5)
Creatinine, Ser: 0.96 mg/dL (ref 0.4–1.2)
GFR calc Af Amer: >60 mL/min (ref >60)
Sodium: 140 mEq/L (ref 135–145)

## 2010-09-15 LAB — CBC
Hemoglobin: 13.8 g/dL (ref 12.0–15.0)
RBC: 4.57 MIL/uL (ref 3.87–5.11)
WBC: 9.1 10*3/uL (ref 4.0–10.5)

## 2010-09-15 LAB — APTT: aPTT: 29 seconds (ref 24–37)

## 2010-09-15 LAB — PROTIME-INR
INR: 2.4 — ABNORMAL HIGH (ref 0.00–1.49)
Prothrombin Time: 25.7 seconds — ABNORMAL HIGH (ref 11.6–15.2)

## 2010-09-16 LAB — CBC
MCV: 89.6 fL (ref 78.0–100.0)
Platelets: 279 10*3/uL (ref 150–400)
WBC: 10.7 10*3/uL — ABNORMAL HIGH (ref 4.0–10.5)

## 2010-09-16 LAB — BASIC METABOLIC PANEL
BUN: 8 mg/dL (ref 6–23)
Calcium: 9.1 mg/dL (ref 8.4–10.5)
Chloride: 107 mEq/L (ref 96–112)
Creatinine, Ser: 0.91 mg/dL (ref 0.4–1.2)
GFR calc non Af Amer: >60 mL/min (ref >60)

## 2010-09-16 LAB — MAGNESIUM: Magnesium: 2.1 mg/dL (ref 1.5–2.5)

## 2010-09-16 LAB — PROTIME-INR
INR: 2.3 — ABNORMAL HIGH (ref 0.00–1.49)
Prothrombin Time: 25.2 seconds — ABNORMAL HIGH (ref 11.6–15.2)

## 2010-09-18 ENCOUNTER — Ambulatory Visit (INDEPENDENT_AMBULATORY_CARE_PROVIDER_SITE_OTHER): Payer: BC Managed Care – PPO | Admitting: Pharmacist

## 2010-09-18 DIAGNOSIS — Z7901 Long term (current) use of anticoagulants: Secondary | ICD-10-CM

## 2010-09-18 DIAGNOSIS — I4891 Unspecified atrial fibrillation: Secondary | ICD-10-CM

## 2010-09-18 LAB — BASIC METABOLIC PANEL
BUN: 12 mg/dL (ref 6–23)
GFR calc Af Amer: >60 mL/min (ref >60)
GFR calc non Af Amer: >60 mL/min (ref >60)
Potassium: 4.2 mEq/L (ref 3.5–5.1)

## 2010-09-18 LAB — CBC
HCT: 39.2 % (ref 36.0–46.0)
Platelets: 245 10*3/uL (ref 150–400)
RBC: 4.43 MIL/uL (ref 3.87–5.11)
WBC: 10.6 10*3/uL — ABNORMAL HIGH (ref 4.0–10.5)

## 2010-09-18 LAB — PROTIME-INR: INR: 2.3 — ABNORMAL HIGH (ref 0.00–1.49)

## 2010-09-18 LAB — APTT: aPTT: 28 seconds (ref 24–37)

## 2010-09-18 LAB — HCG, SERUM, QUALITATIVE: Preg, Serum: NEGATIVE

## 2010-09-18 NOTE — Patient Instructions (Signed)
Patient instructed to take medications as defined in the Anti-coagulation Track section of this encounter.  Patient instructed to TAKE today's dose.  Patient verbalized understanding of these instructions.     

## 2010-09-18 NOTE — Progress Notes (Signed)
Anti-Coagulation Progress Note  [name] is a 45 y.o. female who is currently on an anti-coagulation regimen.    RECENT RESULTS: Recent results are below, the most recent result is correlated with a dose of 30 mg. per week--but with TWO omitted doses this past Thursday (5-Apr-12) and Friday (6-Apr-12). Lab Results  Component Value Date   INR 2.2 09/18/2010   INR 3.8 09/14/2010   INR 3.5 08/31/2010    ANTI-COAG DOSE:   Latest dosing instructions   Total Sun Mon Tue Wed Thu Fri Sat   34 4 mg 6 mg 6 mg 6 mg 4 mg 4 mg 4 mg    (4 mg1) (4 mg1.5) (4 mg1.5) (4 mg1.5) (4 mg1) (4 mg1) (4 mg1)         ANTICOAG SUMMARY: Anticoagulation Episode Summary              Current INR goal 2.0-3.0 Next INR check 10/02/2010   INR from last check 2.2 (09/18/2010)     Weekly max dose (mg)  Target end date Indefinite   Indications Long-term (current) use of anticoagulants, A-fib   INR check location [facility]erred [facility]    Send INR reminders to ANTICOAG IMP   Comments        Provider Role Specialty Phone number   Farley Ly, MD  Internal Medicine [phone]        ANTICOAG TODAY: Anticoagulation Summary as of 09/18/2010              INR goal 2.0-3.0     Selected INR 2.2 (09/18/2010) Next INR check 10/02/2010   Weekly max dose (mg)  Target end date Indefinite   Indications Long-term (current) use of anticoagulants, A-fib    Anticoagulation Episode Summary              INR check location [facility] Preferred lab    Send INR reminders to ANTICOAG IMP   Comments        Provider Role Specialty Phone number   Farley Ly, MD  Internal Medicine [phone]        PATIENT INSTRUCTIONS: Patient Instructions  Patient instructed to take medications as defined in the Anti-coagulation Track section of this encounter.  Patient instructed to TAKE today's dose.  Patient verbalized understanding of these instructions.        FOLLOW-UP Return in 2 weeks (on  10/02/2010) for Follow up INR.  Hulen Luster, III Pharm.D., CACP

## 2010-10-01 ENCOUNTER — Other Ambulatory Visit: Payer: Self-pay | Admitting: Internal Medicine

## 2010-10-02 ENCOUNTER — Ambulatory Visit (INDEPENDENT_AMBULATORY_CARE_PROVIDER_SITE_OTHER): Payer: BC Managed Care – PPO | Admitting: Pharmacist

## 2010-10-02 DIAGNOSIS — Z7901 Long term (current) use of anticoagulants: Secondary | ICD-10-CM

## 2010-10-02 DIAGNOSIS — I4891 Unspecified atrial fibrillation: Secondary | ICD-10-CM

## 2010-10-02 NOTE — Patient Instructions (Signed)
Patient instructed to take medications as defined in the Anti-coagulation Track section of this encounter.  Patient instructed to take today's dose.  Patient verbalized understanding of these instructions.    

## 2010-10-02 NOTE — Progress Notes (Signed)
Anti-Coagulation Progress Note  [name] is a 45 y.o. female who is currently on an anti-coagulation regimen.    RECENT RESULTS: Recent results are below, the most recent result is correlated with a dose of 34 mg. per week: Lab Results  Component Value Date   INR 3.2 10/02/2010   INR 2.2 09/18/2010   INR 3.8 09/14/2010    ANTI-COAG DOSE:   Latest dosing instructions   Total Sun Mon Tue Wed Thu Fri Sat   34 4 mg 6 mg 6 mg 6 mg 4 mg 4 mg 4 mg    (4 mg1) (4 mg1.5) (4 mg1.5) (4 mg1.5) (4 mg1) (4 mg1) (4 mg1)         ANTICOAG SUMMARY: Anticoagulation Episode Summary              Current INR goal 2.0-3.0 Next INR check 10/16/2010   INR from last check 3.2! (10/02/2010)     Weekly max dose (mg)  Target end date Indefinite   Indications Long-term (current) use of anticoagulants, A-fib   INR check location [facility] Preferred lab    Send INR reminders to ANTICOAG IMP   Comments        Provider Role Specialty Phone number   Farley Ly, MD  Internal Medicine [phone]        ANTICOAG TODAY: Anticoagulation Summary as of 10/02/2010              INR goal 2.0-3.0     Selected INR 3.2! (10/02/2010) Next INR check 10/16/2010   Weekly max dose (mg)  Target end date Indefinite   Indications Long-term (current) use of anticoagulants, A-fib    Anticoagulation Episode Summary              INR check location [facility] Preferred lab    Send INR reminders to ANTICOAG [facility]   Comments        Provider Role Specialty Phone number   Farley Ly, MD  Internal Medicine [phone]        PATIENT INSTRUCTIONS: Patient Instructions  Patient instructed to take medications as defined in the Anti-coagulation Track section of this encounter.  Patient instructed to take today's dose.  Patient verbalized understanding of these instructions.        FOLLOW-UP Return in 2 weeks (on 10/16/2010) for Follow up INR.  Hulen Luster, III Pharm.D., CACP

## 2010-10-04 ENCOUNTER — Other Ambulatory Visit: Payer: Self-pay | Admitting: Internal Medicine

## 2010-10-16 ENCOUNTER — Ambulatory Visit (INDEPENDENT_AMBULATORY_CARE_PROVIDER_SITE_OTHER): Payer: BC Managed Care – PPO | Admitting: Pharmacist

## 2010-10-16 DIAGNOSIS — I4891 Unspecified atrial fibrillation: Secondary | ICD-10-CM

## 2010-10-16 DIAGNOSIS — Z7901 Long term (current) use of anticoagulants: Secondary | ICD-10-CM

## 2010-10-16 LAB — POCT INR: INR: 3

## 2010-10-16 NOTE — Progress Notes (Signed)
Anti-Coagulation Progress Note  [name] is a 45 y.o. female who is currently on an anti-coagulation regimen.    RECENT RESULTS: Recent results are below, the most recent result is correlated with a dose of 34 mg. per week: Lab Results  Component Value Date   INR 3.0 10/16/2010   INR 3.2 10/02/2010   INR 2.2 09/18/2010    ANTI-COAG DOSE:   Latest dosing instructions   Total Sun Mon Tue Wed Thu Fri Sat   34 4 mg 6 mg 4 mg 6 mg 4 mg 6 mg 4 mg    (4 mg1) (4 mg1.5) (4 mg1) (4 mg1.5) (4 mg1) (4 mg1.5) (4 mg1)         ANTICOAG SUMMARY: Anticoagulation Episode Summary              Current INR goal 2.0-3.0 Next INR check 11/13/2010   INR from last check 3.0 (10/16/2010)     Weekly max dose (mg)  Target end date Indefinite   Indications Long-term (current) use of anticoagulants, A-fib   INR check location [facility] Preferred lab    Send INR reminders to ANTICOAG [facility]   Comments        Provider Role Specialty Phone number   Farley Ly, MD  Internal Medicine [phone]        ANTICOAG TODAY: Anticoagulation Summary as of 10/16/2010              INR goal 2.0-3.0     Selected INR 3.0 (10/16/2010) Next INR check 11/13/2010   Weekly max dose (mg)  Target end date Indefinite   Indications Long-term (current) use of anticoagulants, A-fib    Anticoagulation Episode Summary              INR check location [facility] Preferred lab    Send INR reminders to ANTICOAG [facility]   Comments        Provider Role Specialty Phone number   Farley Ly, MD  Internal Medicine [phone]        PATIENT INSTRUCTIONS: Patient Instructions  Patient instructed to take medications as defined in the Anti-coagulation Track section of this encounter.  Patient instructed to take today's dose.  Patient verbalized understanding of these instructions.        FOLLOW-UP Return in 4 weeks (on 11/13/2010) for Follow up INR.  Hulen Luster, III Pharm.D., CACP

## 2010-10-16 NOTE — Patient Instructions (Signed)
Patient instructed to take medications as defined in the Anti-coagulation Track section of this encounter.  Patient instructed to take today's dose.  Patient verbalized understanding of these instructions.    

## 2010-10-17 ENCOUNTER — Other Ambulatory Visit: Payer: Self-pay | Admitting: Internal Medicine

## 2010-10-24 NOTE — Letter (Signed)
January 27, 2008    Madaline Savage, M.D.  [id number], [address]  [place], [place] [address]   RE:  [name], [name]  MRN:  [id number]  /  DOB:  May 11, 1966   Dear Annette Stable:   Elyn Aquas comes in for followup of hypertrophic cardiomyopathy,  nonsustained ventricular tachycardia, and atrial fibrillation for which  she is taking amiodarone with anticipated pulmonary vein isolation by  Dr. Sampson Goon in 2 weeks.  Her amiodarone has been poorly tolerated.  She has developed some sensitivity, which has had a major impact on her  lifestyle.   She is currently taking 400 mg a day.  She also takes aspirin and  Coumadin.   She currently has no complaints of chest pain or shortness of breath.   On examination, her blood pressure was 122/79 with a pulse of 51.  Her  neck veins were flat.  Her lungs were clear.  Her heart sounds were  regular.  Extremities without edema.   IMPRESSION:  1. Hypertrophic cardiomyopathy.  2. Ventricular tachycardia had been nonsustained.  3. Atrial fibrillation.  4. Amiodarone for the above.  5. Status post implantable cardioverter-defibrillator for the above.  6. Some sensitivity related to amiodarone.   Ms. Milos was stable.  She is to see Dr. Sampson Goon for ablation next  week.  It may be worth considering putting her on disopyramide.   We will see her again following the procedure.  I will tentatively set  something up for 3-4 months, but really would like to be available as  Dr. Sampson Goon needs initially.    Sincerely,      Duke Salvia, MD, [facility]  Electronically Signed    SCK/MedQ  DD: 01/27/2008  DT: 01/28/2008  Job #: [id number]   CC:    Clydie Braun, MD

## 2010-10-24 NOTE — Cardiovascular Report (Signed)
[name], [name] NO.:  [id number]   MEDICAL RECORD NO.:  [id number]          PATIENT TYPE:  INP   LOCATION:  3732                         FACILITY:  [facility]   PHYSICIAN:  Darlin Priestly, MD  DATE OF BIRTH:  1966/02/14   DATE OF PROCEDURE:  09/02/2007  DATE OF DISCHARGE:                            CARDIAC CATHETERIZATION   PROCEDURE:  TE-guided cardioversion.   ATTENDING STAFF:  Darlin Priestly, MD   COMPLICATIONS:  None.   INDICATIONS:  Ms. Gallaher is a 45 year old female patient of Dr. Lavonne Chick with history of hypertrophic obstructive cardiomyopathy recently  admitted with new onset of atrial fibrillation. She was subsequently  begun on IV Diltiazem as well as IV amiodarone. However, she failed to  convert. She is now referred for TE-guided cardioversion.   DESCRIPTION OF PROCEDURE:  After obtaining informed consent, the patient  was brought to the endoscopy suite in the fasting state. She then  underwent successful, uncomplicated transesophageal echocardiogram.   The left ventricle revealed moderate to severe left ventricular  hypertrophy with an interventricular septal dimension of 1.5 cm and  posterior wall dimension of 7 cm. There was normal LV systolic function  and estimated EF of approximately 60%. There did not appear to be any  obvious output tract obstruction.   Structurally normal aortic valve.   Structurally normal mitral valve with mild prolapse of the anterior  mitral valve leaflet and mild mitral regurgitation.   Structurally normal tricuspid valve with trivial tricuspid  regurgitation.   Trivial pulmonic regurgitation.   There was a moderate amount of spontaneous echo contrast noted in the  left atrium and left atrial appendage. There is no evidence of  intracardiac mass or thrombus present.   There is no evidence of PFO by color or contrast Doppler.   Following TE, the patient then was given 150 mg of IV Pentothal by  Dr.  __________, and the patient then received one 150 joule biphasic shock  with restoration of sinus rhythm. She remained hemodynamically stable.  She was transferred to the recovery room in stable condition.   CONCLUSION:  1. Successful transesophageal echocardiogram with findings noted      above.  2. Successful direct current cardioversion to sinus rhythm.      Darlin Priestly, MD  Electronically Signed     RHM/MEDQ  D:  09/02/2007  T:  09/02/2007  Job:  [id number]   cc:   Madaline Savage, M.D.

## 2010-10-24 NOTE — Op Note (Signed)
[name], [name] NO.:  [id number]   MEDICAL RECORD NO.:  [id number]          PATIENT TYPE:  OIB   LOCATION:  2899                         FACILITY:  [facility]   PHYSICIAN:  Duke Salvia, MD, FACCDATE OF BIRTH:  1966/04/09   DATE OF PROCEDURE:  10/13/2008  DATE OF DISCHARGE:                               OPERATIVE REPORT   PREOPERATIVE DIAGNOSIS:  Atrial flutter.   POSTOPERATIVE DIAGNOSIS:  Sinus rhythm.   PROCEDURE:  The patient was sedated with a combination of Versed and  fentanyl.  A 36-joule shock was delivered synchronously in the atrial  flutter.  Terminated the atrial flutter restoring sinus rhythm.  The  patient tolerated the procedure without apparent complication.      Duke Salvia, MD, [facility]  Electronically Signed     SCK/MEDQ  D:  10/13/2008  T:  10/13/2008  Job:  [id number]   cc:   Mick Sell, MD

## 2010-10-24 NOTE — Op Note (Signed)
NAMEALAURA, SCHIPPERS NO.:  [id number]   MEDICAL RECORD NO.:  [id number]          PATIENT TYPE:  INP   LOCATION:  3729                         FACILITY:  [facility]   PHYSICIAN:  Duke Salvia, MD, FACCDATE OF BIRTH:  [birth year]   DATE OF PROCEDURE:  09/25/2007  DATE OF DISCHARGE:                               OPERATIVE REPORT   PREOPERATIVE DIAGNOSES:  1. Hypertrophic cardiomyopathy.  2. Atrial fibrillation.  3. Nonsustained ventricular tachycardia.   POSTOPERATIVE DIAGNOSES:  1. Hypertrophic cardiomyopathy.  2. Atrial fibrillation.  3. Nonsustained ventricular tachycardia.   PROCEDURE:  ICD implantation.   PROCEDURE IN DETAIL:  After obtaining an informed consent, the patient  was brought to electrophysiologic laboratory and placed on the  fluoroscopic table in a supine position.  After routine prep and drape  of the left upper chest, lidocaine was infiltrated in the prepectoral  and subclavicular region, a little bit more medially than normal.  As  per patient's request, an incision was made and carried down to the  layer of the prepectoral fascia using electrocautery and sharp  dissection.  The plaque was fashioned.  Hemostasis was obtained.   Thereafter, attention was turned to gain access to the extrathoracic  left subclavian vein, which was accomplished with modest difficulty.  The artery was punctured on one occasion, pressure was held for 2  minutes.  Then, venipunctures times 2 were accomplished.  A guidewire  was replaced and retained, and sequentially an 8-French and  7-French  sheath were placed through which were passed a St. Jude 161096 cm Active-  Fixation single-cord defibrillator leads, serial number [id number] and a  St. Jude 1680 ATC Active-Fixation atrial lead, serial number [id number].  Under fluoroscopic guidance, we manipulated the right ventricular apex  and the right atrial appendage respectively, where the bipolar R wave  was 17.2  with a pace impedance of 541 ohms with a threshold of 0.8 volts  at 0.5 milliseconds.  Current threshold is 1.3 mA.  There is no  diaphragmatic pacing at 10 volts.  The current intervention was brisk.   The bipolar P wave was 4.4 mV with a pace impedance of 541 ohms at 0.5  milliseconds.  Current threshold is 1.8 mA and there is no diaphragmatic  pacing at 10 volts.  The current intervention was brisk .  These leads  were secured with the prepectoral fascia and then attached to a St. Jude  current 2207 ICD serial number #[id number].  St. Jude device bipolar R wave  was 11.7 with a pace impedance of 480 and threshold of 0.25 at 0.5  milliseconds.  The P wave was 3.5 with a pace impedance of 430 and  threshold of 1.25 volts at 0.5 milliseconds.  High voltage impedance was  54 ohms.   At this point, defibrillation threshold testing was undertaken.  Ventricular fibrillation was induced due to T-wave shock.  After a total  duration of 7 seconds, a 20 joule shocks were delivered through a major  resistance of 52 ohms, terminating ventricular fibrillation and  restoring sinus rhythm.  At this point, the  device was implanted.  The  plaque was copiously irrigated with antibiotic-containing saline  solution.  Hemostasis was ensured.  The leads and the pulse generator  were placed in the pocket and secured to the prepectoral fascia.  The  wound was closed in 3 layers in a  normal fashion.  The wound was washed, dried, and a Benzoin and Steri-  Strips dressing was applied.  Needle counts, sponge counts, and  instruments counts were correct at the end of procedure according to the  staff.  The patient tolerated the procedure without apparent  complications.      Duke Salvia, MD, [facility]  Electronically Signed     SCK/MEDQ  D:  09/25/2007  T:  09/26/2007  Job:  [id number]   cc:   Madaline Savage, M.D.  [facility]  [facility]

## 2010-10-24 NOTE — Op Note (Signed)
NAMEJOHNISHA, LOUKS                ACCOUNT NO.:  [id number]   MEDICAL RECORD NO.:  [id number]          PATIENT TYPE:  OIB   LOCATION:  2899                         FACILITY:  [facility]   PHYSICIAN:  Arturo Morton. Riley Kill, MD, FACCDATE OF BIRTH:  17-Jan-1966   DATE OF PROCEDURE:  DATE OF DISCHARGE:  02/04/2009                               OPERATIVE REPORT   INDICATIONS:  Ms. Macadam is a 45 year old patient of Dr. Graciela Husbands.  She has  had an ablation at [facility].  She  subsequently saw Dr. Graciela Husbands and has had recurrent episodes of post  ablation flutter.  They go quite fast.  She saw Dr. Meredith Pel this morning,  who spoke with Dr. Graciela Husbands.  Dr. Graciela Husbands arranged for Dr. Landry Mellow to do a  cardioversion.  He was unavailable.  I was asked to perform the  procedure.  The patient's history was reviewed.  She was examined.  The  electrocardiograms were reviewed.  Most recent INRs dating back several  months were also reviewed.  The case was discussed with the patient.  Laboratory studies were reviewed.  Risks and benefits were discussed  with the patient and she consented to proceed.   The patient's device was interrogated by industry.  She was then  anesthetized by anesthesia using 100 mg of propofol.  In addition, pads  were placed in the appropriate position for cardioversion.  Synchronized  cardioversion of 120 joules was performed applied x1 with restoration of  sinus rhythm.  Following this, the pads were removed and  electrocardiogram was obtained.  The patient woke up and was alert and  oriented following the procedure.  I then spoke with Dr. Graciela Husbands with  regard to contacting Dr. Sampson Goon at [facility] with regard to  further treatment.      Arturo Morton. Riley Kill, MD, [facility]  Electronically Signed     TDS/MEDQ  D:  02/04/2009  T:  02/05/2009  Job:  [id number]   cc:   Duke Salvia, MD, [facility]  Mick Sell, MD

## 2010-10-24 NOTE — Assessment & Plan Note (Signed)
[facility]                                 ON-CALL NOTE   DENESIA, DONELAN                         MRN:          [id number]  DATE:09/28/2007                            DOB:          1966-03-18    PRIMARY CARDIOLOGIST:  Duke Salvia, MD, [facility]   [name] is a 45 year old female who was in the hospital on September 25, 2007, for an ICD implantation which was performed for nonsustained  ventricular tachycardia and a history of hypertrophic cardiomyopathy.   Ms. Avena stated that she had begun having increased nasal drainage,  and she stated that it was greenish in color, and she was coughing quite  a bit.  She wanted to know what she can take.  I advised her that she  should talk to her family physician as it sounded like she might need  antibiotics for possible sinus infection.  I advised her that her local  pharmacist could look at her medication profile and advise her what over-  the-counter medications would be safest for her, but, in general, if a  medication was safe for someone with high blood pressure, she should be  able to take it.  I reiterated that she should talk to her primary care  physician about possibly getting on some antibiotics and getting some  narcotic cough medicine as she states she was not getting much rest  because of the coughing.  She stated she would do so.      Theodore Demark, PA-C  Electronically Signed      Madolyn Frieze. Jens Som, MD, [facility]  Electronically Signed   RB/MedQ  DD: 09/28/2007  DT: 09/28/2007  Job #: [id number]

## 2010-10-24 NOTE — Letter (Signed)
Oct 16, 2007    Clydie Braun, M.D.  Department of Electrophysiology  [facility] [place], [place] [address]   RE:  [name], [name]  MRN:  [id number]  /  DOB:  [birth year]   Dear Onalee Hua:   This letter is to reintroduce [facility].  As I mentioned to you on  the telephone, you are well acquainted with her family; there are many  hypertrophic cardiomyopathic [facility], and you have cared  for a number of them over the years.   Ms. Urbanski presented this spring with symptomatic rapid atrial  fibrillation associated with presyncope.  She also had nonsustained  ventricular tachycardia, and with her family history of sudden death, it  was elected to implant an ICD for primary prevention.  Amiodarone was  initiated for control of her atrial fibrillation, and she has been  maintaining sinus rhythm on this.   She has had some degree of bradycardia, but she was averse to having her  pacemaker rate programmed on, so we have left it at backup rate of 40.   She would like to discuss with you catheter ablation of her atrial  fibrillation, and I appreciation your willingness to see her and your  help as always.   I should note that on her medication list today had her still on  amiodarone 400 mg a day which we decreased to 200, aspirin 81, and  Coumadin.   Her blood pressure is 112/77, her pulse was 61.  Her lungs were clear.  Her heart sounds were regular with an S4.  The device pocket was well-  healed.  Extremities had no edema.   Her implanted device is a St. Jude 2207 dual chamber ICD.   Onalee Hua, thank you very much for your help as always and looking forward  to seeing you in [place].    Sincerely,      Duke Salvia, MD, [facility]  Electronically Signed    SCK/MedQ  DD: 10/16/2007  DT: 10/16/2007  Job #: [id number]

## 2010-10-24 NOTE — Op Note (Signed)
[name], [name]                ACCOUNT NO.:  [id number]   MEDICAL RECORD NO.:  [id number]          PATIENT TYPE:  AMB   LOCATION:  ENDO                         FACILITY:  [facility]   PHYSICIAN:  Doylene Canning. Ladona Ridgel, MD    DATE OF BIRTH:  [birth year]   DATE OF PROCEDURE:  11/30/2008  DATE OF DISCHARGE:  11/30/2008                               OPERATIVE REPORT   SURGEON:  Doylene Canning. Ladona Ridgel, MD   PROCEDURE PERFORMED:  Elective direct current cardioversion.   INDICATIONS:  Symptomatic atrial flutter.   INTRODUCTION:  The patient is a 45 year old woman with hypertrophic  cardiomyopathy and recurrent atrial arrhythmias.  She has undergone AFib  ablation in the past and multiple cardioversions.  She presented to the  office yesterday with several hours of palpitations and was found to be  in atrial flutter with a rapid ventricular response.  This is an  atypical flutter with positive flutter waves in V1 and the inferior  leads suggestive of a left atrial flutter.  Attempts were made to  terminate the flutter with antitachycardia pacing, which were  unsuccessful.  She is now referred for DC cardioversion.   PROCEDURE:  After informed consent was obtained, the patient was taken  to the Diagnostic EP Lab in a fasting state.  She was prepped in the  usual manner.  She was given fentanyl and Versed for IV sedation.  She  was cardioverted with 200 joules of synchronized biphasic energy by the  way of the  electrodispersive pads, which had been placed in the  anterior-posterior position which subsequently restored sinus rhythm.  The patient tolerated the procedure well.  She was observed for several  minutes while her sedation was allowed to wear off, and she was returned  to her room in satisfactory condition.   COMPLICATIONS:  There were no immediate procedure complications.   RESULTS:  This demonstrated successful DC cardioversion in a patient  with symptomatic atrial flutter.      Doylene Canning. Ladona Ridgel, MD  Electronically Signed     GWT/MEDQ  D:  11/30/2008  T:  11/30/2008  Job:  [id number]   cc:   Duke Salvia, MD, [facility]  Mick Sell, MD

## 2010-10-24 NOTE — Consult Note (Signed)
[name] NO.:  [id number]   MEDICAL RECORD NO.:  [id number]          PATIENT TYPE:  INP   LOCATION:  3732                         FACILITY:  [facility]   PHYSICIAN:  Duke Salvia, MD, FACCDATE OF BIRTH:  10-28-1965   DATE OF CONSULTATION:  09/01/2007  DATE OF DISCHARGE:                                 CONSULTATION   Thank you very much for asking [name] to see [name] in  electrophysiological consultation for atrial arrhythmias.   Ms.  Gaines is a 45 year old lady with a known history of hypertrophic  nonobstructive cardiomyopathy who has had palpitations for the last 9  years.  Last week she found herself having _______ and then they would  wax and wane for the next 48 hours until she saw [name] in his  office at which time she felt that she was asymptomatic but was noted to  be in her atrial arrhythmia (see below).  In the preceding 48 hours.  She had multiple episodes of severe tachy palpitations accompanied by  shortness of breath, chest discomfort as well as presyncope.  Prior  palpitations were unassociated with the aforementioned symptoms.  She  has no history of syncope.   After having been seen in the office because of the rapid atrial  arrhythmia and ventricular response, she was admitted to hospital for  rate control anticoagulation and anticipation of restoration of sinus  rhythm preferably via catheter ablation.  While in hospital and on 24-  hour monitor, she has had at least one episode of nonsustained  ventricular tachycardia.   Her hypertrophic status is not quite clear to me.  She had an echo read  in 1983 describing 2.5 cm thick-walls and an echo read in 2003  describing 1.9 cm thick-walled.   Further issue of risk stratification include history of sudden death in  a first cousin of the patient's mother as well as more remote siblings.   Medically remotely she has been on Inderal, she has been on Toprol more  recently.   PAST MEDICAL HISTORY:  In addition to the above is notable for GE reflux  disease.   PAST SURGICAL HISTORY:  Notable for cholecystectomy.   REVIEW OF SYSTEMS:  As noted primarily as above and is otherwise broadly  negative as noted on the intake sheets apart from progressive dyspnea on  exertion x2 weeks suggesting perhaps a longer antecedent history of  atrial fibrillation.   SOCIAL HISTORY:  She is unmarried.  She lives with a friend.  She works  as a Therapist, occupational.  She drinks alcohol occasionally.   CURRENT MEDICATIONS:  1. Toprol 25 IV.  2. Cardizem.  3. Subcutaneous heparin via Lovenox.   PHYSICAL EXAMINATION:  GENERAL:  She is a younger Caucasian female  appearing her stated age of 45.  VITAL SIGNS:  Pulse was 130, respirations were 18.  HEENT:  Exam demonstrated no icterus or xanthomata.  NECK:  Veins were flat.  The carotids were brisk and full bilaterally  without bruits.  BACK:  Without kyphosis, scoliosis.  LUNGS:  Clear.  HEART:  Sounds were irregular without murmurs or gallops.  Rate was  quite rapid.  ABDOMEN:  Soft with active bowel sounds without midline pulsation or  hepatomegaly.  Femoral pulses were 2+.  Distal pulses were intact.  There was no clubbing, cyanosis or edema.  NEUROLOGICAL:  Exam was grossly normal.  SKIN:  Warm and dry.   ________ demonstrated nonsustained ventricular tachycardia.  Electrocardiograms dated yesterday demonstrated atrial fibrillation that  was quite coarse with variable AA intervals ranging from 160 the day  before to 240.  There was left axis deviation.  There was borderline  voltage criteria with repolarization abnormality.   IMPRESSION:  1. Hypertrophic cardiomyopathy.  2. Rapid atrial fibrillation associated with #1.  3. Presyncope and dyspnea on exertion contribute to #4.  4. Risk factors for sudden cardiac death including:      a.     Nonsustained ventricular tachycardia.      b.     Family history of sudden death in a  second degree relative.      c.     Question septal thickness.      d.     Intermittent palpitations associated with persistence of       atrial fibrillation.   DISCUSSION:  [name] has atrial fibrillation with a rapid ventricular  response that is intermittently associated with palpitations, thus  duration is not as clear as we would like.  Restoration of sinus rhythm  I think is very important in its likelihood of being maintained will be  related to her left atrial dimensions.  Given the fact that she has  longstanding tachy palpitations, I think at this point it is valuable to  try to maintain sinus rhythm.  It is certainly the control of  ventricular response, and the best drug for this probably is amiodarone.  I outlined a list of side effects with [name] in addition to its  efficacy, and she is agreeable to proceeding.   Furthermore, the thromboembolic risk in this cohort is quite high.  Coumadin is indicated and I have reviewed this also with her and  including the potential for life-threatening hemorrhage.  She  understands and is willing to proceed.   The next issue is the restoration of sinus rhythm and I think  transesophageal guided cardioversion is appropriate, especially as the  patient is not as sure of her duration of atrial fibrillation as we  would like.  She is agreeable to proceed with this and we will talk to  [name] about moving along with that expeditiously.   The other issue is her risk of sudden cardiac death.  Risk factors for  this include wall thickness greater than three, nonsustained ventricular  tachycardia, hypotensive response to exercise, family history described  as first-degree and/or other relatives in ________ of 2007 as well as  syncope not consistent with neurally mediated syncope.  She has at least  nonsustained ventricular tachycardia.  Her wall thickness needs to be  readdressed and the issue of her sudden death in her family  cohort since  they are in the background.  It would be my bias given the recent paper  from ________ that with the risk factors as outlined that it would be  appropriate to consider ICD implantation.   RECOMMENDATIONS:  Based on the above, I would therefore precede with:  1. Initiate amiodarone.  2. Coumadin.  3. TE guided cardioversion.  4. Continued discussions regarding ICD implantation.  Duke Salvia, MD, [facility]  Electronically Signed     SCK/MEDQ  D:  09/01/2007  T:  09/01/2007  Job:  [id number]   cc:   Madaline Savage, M.D.

## 2010-10-24 NOTE — Assessment & Plan Note (Signed)
[facility]                                 ON-CALL NOTE   QUINTARA, BOST                       MRN:          [id number]  DATE:10/24/2009                            DOB:          11-09-1965    I was contacted by Ms. [name] this evening.  She states that she is a 45-  year-old female with a history of paroxysmal atrial fibrillation.  She  has had to be cardioverted multiple times.  This is her ninth episode of  atrial fibrillation since 2009.  This morning, she was in her usual  state of health and at 4 a.m. on Monday, Oct 24 2009, she got up to go  to the bathroom and noticed that she was in atrial fibrillation.  She  checked her blood pressure and it was 128/89 and her heart rate was 144.  She is taking Coumadin, diltiazem and Multaq.  I suggested that she take  an extra dose of her diltiazem.  She does not want to come to the  hospital, but would prefer to contact the office in a few hours once it  is open to be scheduled for an outpatient cardioversion.  She says this  is usually how things are done.  I instructed her that if she develops  any active cardiac symptoms, weakness, numbness, lightheadedness or  syncope, then she needed to come to the emergency room immediately.  She  voiced understanding.  The patient will call for an appointment in the  morning.  I have also left a note for the morning physicians to follow-  up on this as well.     Therisa Doyne, MD    SJT/MedQ  DD: 10/24/2009  DT: 10/24/2009  Job #: [id number]

## 2010-10-27 NOTE — H&P (Signed)
[place]  Patient:    [name], [name] Visit Number: [id number] MRN: [id number]          Service Type: EMS Location: [facility] Attending Physician:  Corlis Leak. Dictated by:   Adolph Pollack, M.D. Admit Date:  07/13/2001   CC:         Madaline Savage, M.D.   History and Physical  CHIEF COMPLAINT:  Right upper quadrant pain, nausea and diaphoresis.  HISTORY OF PRESENT ILLNESS:  This 45 year old female had a cheeseburger this afternoon for lunch and 45 minutes after that, began having pressure-type epigastric and right upper quadrant pain.  The pain radiated through to her back and it was associated with diaphoresis and nausea.  The pain did not let up and she subsequently came to the emergency department seeking evaluation. She states in the past she has had multiple other episodes like this but none very severe.  Most would resolve spontaneously.  She has had some episodes that awoke her from sleep.  She just thought it was indigestion and was treating herself with over-the-counter antacid medication.  No fever or chills.  No jaundice.  PAST MEDICAL HISTORY:  Idiopathic hypertrophic subaortic stenosis.  PREVIOUS OPERATIONS:  None.  ALLERGIES:  None.  MEDICATIONS: 1. Toprol-XL 25 mg q.d. 2. Oral contraceptive pill.  SOCIAL HISTORY:  No tobacco use.  She occasionally has an alcoholic beverage.  FAMILY HISTORY:  Positive for hypertension, idiopathic hypertrophic subaortic stenosis, coronary artery disease.  REVIEW OF SYSTEMS:  CARDIOVASCULAR:  As above.  PULMONARY:  No pneumonia, asthma, emphysema.  GI:  No peptic ulcer disease, hepatitis or diverticulitis. GU:  No kidney stones.  ENDOCRINE:  No diabetes or thyroid disease. HEMATOLOGIC:  No bleeding disorders, transfusions or deep venous thrombosis. NEUROLOGIC:  No seizures disorders.  PHYSICAL EXAMINATION:  GENERAL:  An ill-appearing female.  VITAL SIGNS:  Temperature is  98.4, blood pressure 123/64, pulse 69.  SKIN:  Warm and dry without jaundice.  EYES:  Extraocular motions intact.  No icterus.  NECK:  Supple without palpable masses.  NODES:  No palpable cervical or supraclavicular adenopathy.  CARDIOVASCULAR:  Heart demonstrates a regular rate and rhythm with a systolic murmur.  RESPIRATORY:  Breath sounds are equal and clear.  Respirations unlabored.  ABDOMEN:  Soft with right upper quadrant and epigastric tenderness to palpation.  No masses palpable.  Active bowel sounds heard.  BACK:  No costovertebral angle tenderness.  EXTREMITIES:  Full range of motion.  No cyanosis or edema.  LABORATORY AND ACCESSORY DATA:  White blood cell count is 13,200 with a hemoglobin of 13.3.  Total bilirubin is 1.7, SGPT 56, SGOT 85.  Alkaline phosphatase, amylase and lipase are normal.  Urinalysis negative.  Urine pregnancy test negative.  Abdominal ultrasound demonstrates multiple gallstones.  No gallbladder wall thickening or pericholecystic fluid.  Troponin levels are within normal limits.  EKG does not show any acute changes.  IMPRESSION:  Early manifestations of acute cholecystitis.  She has had some Dilaudid and the pain is letting up some but this has been a prolonged period of pain for her, lasting at least six to seven hours.  Also has idiopathic hypertrophic subaortic stenosis, treated by Dr. Madaline Savage.  PLAN:  We will admit her and place her on IV antibiotics.  I have called [name] and asked somebody to see her early tomorrow.  If there are no cardiac contraindications to an operation, we will proceed with laparoscopic cholecystectomy tomorrow.  I did explain the procedure, rationale and risks to her including, but not limited to, bleeding, infection, common bile duct injury, small intestinal injury, hepatic injury, and risks of general anesthesia.  We also talked about potential postoperative indigestion and diarrhea.  She  seems to understand these and would like to proceed in this fashion. Dictated by:   Adolph Pollack, M.D. Attending Physician:  Corlis Leak DD:  07/13/01 TD:  07/14/01 Job: [id number]

## 2010-10-27 NOTE — Op Note (Signed)
[place]  Patient:    [name], [name] Visit Number: [id number] MRN: [id number]          Service Type: SUR Location: [id number] Attending Physician:  Arlis Porta Dictated by:   Adolph Pollack, M.D. Proc. Date: 07/14/01 Admit Date:  07/13/2001   CC:         Madaline Savage, M.D.   Operative Report  PREOPERATIVE DIAGNOSIS:  Acute cholecystitis.  POSTOPERATIVE DIAGNOSIS:  Acute cholecystitis with choledocholithiasis.  PROCEDURES:  Laparoscopic cholecystectomy, intraoperative cholangiogram, transcystic common bile duct exploration.  SURGEON: Adolph Pollack, M.D.  ASSISTANT:  Anselm Pancoast. Zachery Dakins, M.D.  ANESTHESIA:  General.  INDICATION:  This is a 45 year old female who presented to the emergency department with severe right upper quadrant pressure-type pain radiating to the back, associated with some nausea.  She had had multiple episodes like this before and they were increasing in frequency.  She was noted to have some mild elevation of her liver function tests.  Ultrasound of the gallbladder demonstrated multiple gallstones.  The bile duct diameter was normal.  She is scheduled for elective cholecystectomy.  The morning before surgery she had a little bit more elevation of her liver function tests.  However, she was feeling better.  She has a history of idiopathic hypertrophic subaortic stenosis, and I had talked with Dr. Chanda Busing about coming by and seeing her early in the morning.  He did receive the records from his office, and he had given her clearance to have a complete hysterectomy approximately a year and a half ago.  She is asymptomatic.  The records were reviewed by the anesthesiologist, and she was felt to be an acceptable candidate for the anesthesia without necessarily needing preoperative cardiac clearance, and thus we proceeded without the cardiology consult.  DESCRIPTION OF PROCEDURE:  She  was placed supine on the operating table, and a general anesthetic was administered.  The abdomen was sterilely prepped and draped.  Local anesthesia consisting of 0.5% Marcaine with epinephrine was infiltrated in the subumbilical region, and a small subumbilical incision was made.  Blunt dissection was used to separate the subcutaneous tissue and identify the midline fascia.  A 1-1.5 cm incision was made in the midline fascia.  The peritoneal cavity was then entered bluntly and under direct vision.  A pursestring suture of 0 Vicryl was placed around the fascial edges. A Hasson trocar was introduced into the peritoneal cavity, and pneumoperitoneum was created by insufflation of CO2 gas.  The laparoscope was introduced.  The gallbladder was inflamed and injected. The patient was placed in the appropriate position and under direct visualization, an 11 mm trocar was placed through an epigastric incision and two 5 mm trocars placed through two right midabdominal incisions.  The fundus of the gallbladder was grasped.  The gallbladder was fairly small and contracted.  The infundibulum was grasped.  Using careful blunt dissection and staying right on the gallbladder, I identified the cystic artery, which was laying anterior to the cystic duct.  I clipped the cystic artery and divided it.  I then exposed the cystic duct at its junction with the gallbladder and identified it and then isolated it for a distance.  I then placed a clip at the cystic duct-gallbladder junction and made an incision in the cystic duct. The cystic duct did appear somewhat patulous.  I subsequently inserted the cholangiocatheter and performed a cholangiogram.  Under real-time fluoroscopy, contrast material was injected into the  cystic duct.  The cystic duct was of adequate length.  The hepatic duct filled quickly along with the right and left hepatic ducts.  Only the most proximal portion of the common bile duct filled,  and it appeared that there was a stone there.  I subsequently gave the patient 1 mg of glucagon and then irrigated out the common bile duct.  I passed the cholangiocatheter into the duodenum and verified its position in the duodenum by injecting contrast into the duodenum under fluoroscopy.  I then inflated the balloon and carefully pulled it back through the common bile duct.  I did not get any residual stone material, but I did get some sludge.  Next I performed another cholangiogram.  This time the common bile duct filled rapidly, and the contrast spilled into the duodenum.  There were a few irregularities in the distal common bile duct, which may have demonstrated some sludge.  I then removed the cholangiocatheter, I clipped the cystic duct three times proximally, and then divided it.  I then dissected the gallbladder free from the liver bed using the electrocautery.  I did have to clip the cystic artery one more time in its proximal position.  Once the gallbladder was removed, it was placed in an Endopouch bag.  The perihepatic area was copiously irrigated.  Bleeding points in the gallbladder fossa were controlled using electrocautery.  I then removed the gallbladder in the bag through the subumbilical port and evacuated all the perihepatic fluid. The rest of the trocars were removed, and the pneumoperitoneum was released.  The subumbilical fascial defect was closed by tightening up and tying down the pursestring suture, and the skin incisions were closed with 4-0 Monocryl subcuticular sutures.  Steri-Strips and sterile dressings were applied.  She tolerated the procedure well without any apparent complications and was taken to the recovery room in satisfactory condition. Dictated by:   Adolph Pollack, M.D. Attending Physician:  Arlis Porta DD:  07/14/01 TD:  07/14/01 Job: [id number]

## 2010-10-27 NOTE — Discharge Summary (Signed)
NAMEJENIKA, CHIEM NO.:  [id number]   MEDICAL RECORD NO.:  [id number]          PATIENT TYPE:  INP   LOCATION:  3732                         FACILITY:  [facility]   PHYSICIAN:  Madaline Savage, M.D.DATE OF BIRTH:  09/14/1965   DATE OF ADMISSION:  08/29/2007  DATE OF DISCHARGE:  09/03/2007                               DISCHARGE SUMMARY   DISCHARGE DIAGNOSES:  1. Palpitations presyncope.      a.     Atrial fibrillation with rapid ventricular response.      b.     Placed on IV amiodarone and IV Cardizem secondary to       tachycardia.      c.     Status post transesophageal echocardiography September 02, 2007,       no left atrial  appendage thrombus,  underwent cardioversion and       no significant outflow tract obstruction by transesophageal       echocardiography.  2. Hypertrophic obstructive cardiomyopathy.      a.     Familial representation.      b.     Sudden cardiac death in cousin.  3. Anticoagulation with Lovenox and Coumadin crossover.  4. Nonsustained ventricular tachycardia.  5. Plan for implantable cardioverter-defibrillator September 25, 2007, at      6 a.m. with Dr. Graciela Husbands.  6. During hospitalization some chest discomfort with nausea, no      return, negative CK-MBs.  7. History of sinus bradycardia, on 25 of Toprol daily.  8. History of gastroesophageal reflux disease.  9. Allergy symptoms with headache and stuffy nose during      hospitalization, resolved with discontinuing oxygen and Claritin.   DISCHARGE MEDICATIONS:  1. Amiodarone 200-mg tablets, 2 tablets 3 times daily September 03, 2007,      through September 07, 2007, and then 2 tabs twice daily September 08, 2007,      to September 11, 2007, then 2 tabs daily starting September 12, 2007.  2. Aspirin 81 mg daily.  3. Lovenox 75 mg subcu q.12 h. 9 a.m. and 9 p.m.  4. Coumadin 5 mg, take one and half tablets today September 03, 2007, and      7.5 mg, one and half tablets on September 04, 2007, and then per      Coumadin  Clinic, take in the evenings between 4 and 6 p.m.  5. Lopressor 25 mg half a tablet twice a day.  6. Claritin 10 mg daily as needed for allergies.   DISCHARGE INSTRUCTIONS:  1. No work until Monday or feel like you are ready.  2. Low-sodium, heart-healthy diet.  3. Increased activity slowly.  4. Followup with [name] on September 16, 2007, at 4 p.m.  5. Followup with Dr. Graciela Husbands, this is kind of short stay for ICD      placement for September 25, 2007, at 6 a.m.  6. Return to [facility] on September 25, 2007, at 6 a.m., nothing to eat      or drink after midnight Wednesday, September 24, 2007, you may take  morning medication.  7. Have blood work done on September 22, 2007, at [place].   HISTORY OF PRESENT ILLNESS:  A 45 year old white female with known  history of HOCM and familial history, was admitted to [facility] on  September 07, 2007, after presenting to Dr. Truett Perna office with increased  heart rate.  Beta blocker was increased and she was sent to [facility] and  placed on IV heparin.  Her greatest complaint is fatigue.  She has not  felt well and had palpitations.   PAST MEDICAL HISTORY:  History of HOCM and gastroesophageal reflux  disease.  Outpatient managed Toprol 25 daily.   ALLERGIES:  No known allergies.   SOCIAL HISTORY, FAMILY HISTORY, AND REVIEW OF MEDS:  See H&P.   PHYSICAL EXAMINATION:  VITAL SIGNS:  Discharge blood pressure 95/60 to  117/64, temperature 98.4, pulse 63, respiratory rate 20, and oxygen  saturation on room 98%.  GENERAL:  Alert and oriented white female.  LUNGS:  Clear.  HEART:  S1 and S2.  Regular rate and rhythm.   LABORATORY DATA:  Initial CBC, hemoglobin 13, hematocrit 38.9, WBC 9.7,  platelets 240, and prior to discharge hemoglobin 12.8, hematocrit 37.8,  WBC 8.9, and platelets 222.   On chemistry, sodium 141, potassium 4.1, chloride 105, CO2 30, glucose  94, BUN 10, creatinine 0.88, total bilirubin 0.6, alkaline phosphatase  54, SGOT 22, total  protein 6.5, calcium 9.3, magnesium 2.1.  PT on  admission 14.3, INR 1.1, and PTT was 24.  Prior to discharge, pro time  19.9 and INR 1.6 on Coumadin and Lovenox.   UA was clear.   TSH 5.863, but repeat was 2.551, T3 was 166.7, free T4 was 1.22, and  free T3 was 3.2.   Cardiac markers were negative at 39, MBs were 2.1, and troponin of 0.03  x3.   RADIOLOGY:  There is no chest x-ray in the computer at this time and  also had serum pregnancy test  done, which does not appear to be in the  computer either and that was negative.   HOSPITAL COURSE:  Ms. Lengacher was admitted by [name] on September 01, 2007, placed on the heparin, IV Cardizem to control her heart rate and  increased  her beta-blocker.  Her heart rate was difficult to control.  Her IV Cardizem was up and down, and other times, she would  be somewhat  bradycardic and eventually she was started on p.o. amiodarone by EP, but  unfortunately her heart rate got up to 190 at that point, so we added IV  amiodarone with the 115 mg bolus.  The next day, she underwent TEE  cardioversion and converted to sinus rhythm.  She has been switched p.o.  amiodarone.  Continue IV heparin and her IV Cardizem was discontinued.  She was seen by Dr. Graciela Husbands  in the hospital, plans are for ICD as an outpatient secondary to her  HOCM,  to her nonsustained ventricular tachycardiac rhythm and to her  first cousin with sudden death with HOCM.  By 18-Sep-2007, she was  stable and ready for discharge home.  She will follow up as previously  stated.      Darcella Gasman. Annie Paras, N.P.    ______________________________  Madaline Savage, M.D.    LRI/MEDQ  D:  09/04/2007  T:  09/05/2007  Job:  [id number]   cc:   Duke Salvia, MD, [facility]  Laqueta Linden, M.D.

## 2010-10-30 ENCOUNTER — Ambulatory Visit (INDEPENDENT_AMBULATORY_CARE_PROVIDER_SITE_OTHER): Payer: BC Managed Care – PPO | Admitting: Pharmacist

## 2010-10-30 DIAGNOSIS — I4891 Unspecified atrial fibrillation: Secondary | ICD-10-CM

## 2010-10-30 DIAGNOSIS — Z7901 Long term (current) use of anticoagulants: Secondary | ICD-10-CM

## 2010-10-30 NOTE — Patient Instructions (Signed)
Patient instructed to take medications as defined in the Anti-coagulation Track section of this encounter.  Patient instructed to take today's dose.  Patient verbalized understanding of these instructions.    

## 2010-10-30 NOTE — Progress Notes (Signed)
Anti-Coagulation Progress Note  [name] is a 45 y.o. female who is currently on an anti-coagulation regimen.    RECENT RESULTS: Recent results are below, the most recent result is correlated with a dose of 34 mg. per week: Lab Results  Component Value Date   INR 2.4 10/30/2010   INR 3.0 10/16/2010   INR 3.2 10/02/2010    ANTI-COAG DOSE:   Latest dosing instructions   Total Sun Mon Tue Wed Thu Fri Sat   32 4 mg 6 mg 4 mg 6 mg 4 mg 4 mg 4 mg    (4 mg1) (4 mg1.5) (4 mg1) (4 mg1.5) (4 mg1) (4 mg1) (4 mg1)         ANTICOAG SUMMARY: Anticoagulation Episode Summary              Current INR goal 2.0-3.0 Next INR check 11/20/2010   INR from last check 2.4 (10/30/2010)     Weekly max dose (mg)  Target end date Indefinite   Indications Long-term (current) use of anticoagulants, A-fib   INR check location [facility] Preferred lab    Send INR reminders to ANTICOAG IMP   Comments        Provider Role Specialty Phone number   Farley Ly, MD  Internal Medicine [phone]        ANTICOAG TODAY: Anticoagulation Summary as of 10/30/2010              INR goal 2.0-3.0     Selected INR 2.4 (10/30/2010) Next INR check 11/20/2010   Weekly max dose (mg)  Target end date Indefinite   Indications Long-term (current) use of anticoagulants, A-fib    Anticoagulation Episode Summary              INR check location [facility] Preferred lab    Send INR reminders to ANTICOAG IMP   Comments        Provider Role Specialty Phone number   Farley Ly, MD  Internal Medicine [phone]        PATIENT INSTRUCTIONS: Patient Instructions  Patient instructed to take medications as defined in the Anti-coagulation Track section of this encounter.  Patient instructed to take today's dose.  Patient verbalized understanding of these instructions.        FOLLOW-UP Return in 3 weeks (on 11/20/2010) for Follow up INR.  Hulen Luster, III Pharm.D., CACP

## 2010-11-07 ENCOUNTER — Telehealth: Payer: Self-pay | Admitting: Internal Medicine

## 2010-11-07 NOTE — Telephone Encounter (Signed)
I spoke with [name]. He will be sending an order for 24 holter per the request of Dr. Velta Addison at [facility]. They would like this to be done this week with results to them in time for her appt on 11/13/10 at noon. I have spoken with Windell Moulding. She will set this up when the order is received and the patient has also been made aware we will be in touch with her to get the monitor placed.

## 2010-11-07 NOTE — Telephone Encounter (Signed)
I called and spoke with the patient. She states she has not called me back again because she contacted her doctor's office and [facility] and the PA, [employer], stated he would be contacting our office to try to place a monitor on the patient. Per Babette Relic, the [facility] # is [phone]. I explained to the patient I would check with Windell Moulding to see if she has received a call about this and then call [facility]. She is agreeable. Sherri Rad, RN, BSN   I spoke with Windell Moulding. She had a message on her voice mail from [facility] with a cell # [phone]. I told Windell Moulding I would call him. Page sent to [employer]. Sherri Rad, RN, BSN

## 2010-11-07 NOTE — Telephone Encounter (Signed)
Per pt calling . C/O out of rhythm,  went out of rhythm on yesterday about 1 pm.m.

## 2010-11-07 NOTE — Telephone Encounter (Signed)
LMTC

## 2010-11-07 NOTE — Telephone Encounter (Signed)
Order was received from [facility] and given to [place]. Per Windell Moulding, the patient will come tomorrow to have her holter placed. I will fax results to [facility] when received. Fax # [phone]/ phone [phone].

## 2010-11-07 NOTE — Telephone Encounter (Signed)
Pt at work please call [phone].  She is returning your call.

## 2010-11-08 ENCOUNTER — Telehealth: Payer: Self-pay | Admitting: Internal Medicine

## 2010-11-08 ENCOUNTER — Encounter (INDEPENDENT_AMBULATORY_CARE_PROVIDER_SITE_OTHER): Payer: BC Managed Care – PPO

## 2010-11-08 DIAGNOSIS — I4891 Unspecified atrial fibrillation: Secondary | ICD-10-CM

## 2010-11-08 NOTE — Telephone Encounter (Signed)
Pt Signed ROI,picked up Copy Of Echo  11/08/10/km

## 2010-11-10 ENCOUNTER — Emergency Department (HOSPITAL_COMMUNITY)
Admission: EM | Admit: 2010-11-10 | Discharge: 2010-11-10 | Disposition: A | Discharge status: Home / Self Care | Payer: BC Managed Care – PPO | Attending: Emergency Medicine | Admitting: Emergency Medicine

## 2010-11-10 ENCOUNTER — Emergency Department (HOSPITAL_COMMUNITY): Payer: BC Managed Care – PPO

## 2010-11-10 DIAGNOSIS — R300 Dysuria: Secondary | ICD-10-CM | POA: Insufficient documentation

## 2010-11-10 DIAGNOSIS — R10816 Epigastric abdominal tenderness: Secondary | ICD-10-CM | POA: Insufficient documentation

## 2010-11-10 DIAGNOSIS — Z95810 Presence of automatic (implantable) cardiac defibrillator: Secondary | ICD-10-CM | POA: Insufficient documentation

## 2010-11-10 DIAGNOSIS — I4891 Unspecified atrial fibrillation: Secondary | ICD-10-CM | POA: Insufficient documentation

## 2010-11-10 DIAGNOSIS — F411 Generalized anxiety disorder: Secondary | ICD-10-CM | POA: Insufficient documentation

## 2010-11-10 DIAGNOSIS — R000 Tachycardia, unspecified: Secondary | ICD-10-CM | POA: Insufficient documentation

## 2010-11-10 DIAGNOSIS — R109 Unspecified abdominal pain: Secondary | ICD-10-CM | POA: Insufficient documentation

## 2010-11-10 DIAGNOSIS — R0602 Shortness of breath: Secondary | ICD-10-CM | POA: Insufficient documentation

## 2010-11-10 DIAGNOSIS — I959 Hypotension, unspecified: Secondary | ICD-10-CM | POA: Insufficient documentation

## 2010-11-10 DIAGNOSIS — R079 Chest pain, unspecified: Secondary | ICD-10-CM | POA: Insufficient documentation

## 2010-11-10 DIAGNOSIS — R197 Diarrhea, unspecified: Secondary | ICD-10-CM | POA: Insufficient documentation

## 2010-11-10 LAB — COMPREHENSIVE METABOLIC PANEL
Albumin: 3.6 g/dL (ref 3.5–5.2)
BUN: 12 mg/dL (ref 6–23)
Chloride: 106 mEq/L (ref 96–112)
Creatinine, Ser: 0.67 mg/dL (ref 0.4–1.2)
GFR calc non Af Amer: >60 mL/min (ref >60)
Total Bilirubin: 0.5 mg/dL (ref 0.3–1.2)

## 2010-11-10 LAB — URINE MICROSCOPIC-ADD ON

## 2010-11-10 LAB — CBC
HCT: 38.8 % (ref 36.0–46.0)
Hemoglobin: 12.9 g/dL (ref 12.0–15.0)
RBC: 4.45 MIL/uL (ref 3.87–5.11)
RDW: 13.9 % (ref 11.5–15.5)
WBC: 7.8 10*3/uL (ref 4.0–10.5)

## 2010-11-10 LAB — URINALYSIS, ROUTINE W REFLEX MICROSCOPIC
Bilirubin Urine: NEGATIVE
Hgb urine dipstick: NEGATIVE
Specific Gravity, Urine: 1.018 (ref 1.005–1.030)
pH: 6.5 (ref 5.0–8.0)

## 2010-11-10 LAB — TROPONIN I: Troponin I: <0.3 ng/mL (ref <0.30)

## 2010-11-10 LAB — CK TOTAL AND CKMB (NOT AT ARMC)
CK, MB: 2.3 ng/mL (ref 0.3–4.0)
Relative Index: INVALID (ref 0.0–2.5)

## 2010-11-10 LAB — DIFFERENTIAL
Basophils Absolute: 0 10*3/uL (ref 0.0–0.1)
Lymphocytes Relative: 21 % (ref 12–46)
Neutro Abs: 5.3 10*3/uL (ref 1.7–7.7)
Neutrophils Relative %: 69 % (ref 43–77)

## 2010-11-10 LAB — PROTIME-INR: INR: 3.11 — ABNORMAL HIGH (ref 0.00–1.49)

## 2010-11-14 ENCOUNTER — Other Ambulatory Visit: Payer: Self-pay | Admitting: Internal Medicine

## 2010-11-14 ENCOUNTER — Telehealth: Payer: Self-pay | Admitting: Internal Medicine

## 2010-11-14 NOTE — Telephone Encounter (Signed)
Pt was seen in ED on June 1, and she would like to speak to you regarding the cardioversion they  talked about.  Please leave message on her voice mail if not in her office.  Pt wanted to speak to [employer] but  I am unable to direct a call to her.

## 2010-11-15 NOTE — Telephone Encounter (Signed)
Please schedule a followup appointment in my clinic. 

## 2010-11-16 ENCOUNTER — Telehealth: Payer: Self-pay | Admitting: *Deleted

## 2010-11-16 NOTE — Telephone Encounter (Signed)
Pt called stating she went to hospital Sunday AM with c/o abd pain. She had been on inderal and she thought that might have caused the pain.   Inderal  was stopped on Monday but she still is having abd pain with meals. She would like to be seen. She has tried Prilosec without relief.  In hospital she was given protonix IV which helped but was not sent home on it.  Pt feels bloated and like she has gas.  BM's are normal.  Feels like a bruise in stomach. Rates pain 4/10 but just last while eating and this feeling  Comes and goes. Onset 6 days ago, no Hx of same.  Denies nausea, vomiting or diarrhea.

## 2010-11-16 NOTE — Telephone Encounter (Signed)
I talked with Dr Meredith Pel and he would like pt evaluated in clinic this week.  Appointment scheduled for tomorrow afternoon Pt aware.

## 2010-11-17 ENCOUNTER — Ambulatory Visit (INDEPENDENT_AMBULATORY_CARE_PROVIDER_SITE_OTHER): Payer: BC Managed Care – PPO | Admitting: Internal Medicine

## 2010-11-17 ENCOUNTER — Encounter: Payer: Self-pay | Admitting: Internal Medicine

## 2010-11-17 ENCOUNTER — Encounter: Payer: BC Managed Care – PPO | Admitting: Internal Medicine

## 2010-11-17 ENCOUNTER — Ambulatory Visit (HOSPITAL_BASED_OUTPATIENT_CLINIC_OR_DEPARTMENT_OTHER)
Admission: RE | Admit: 2010-11-17 | Discharge: 2010-11-17 | Disposition: A | Discharge status: Home / Self Care | Payer: BC Managed Care – PPO | Source: Ambulatory Visit | Attending: Internal Medicine | Admitting: Internal Medicine

## 2010-11-17 DIAGNOSIS — I4891 Unspecified atrial fibrillation: Secondary | ICD-10-CM

## 2010-11-17 DIAGNOSIS — R109 Unspecified abdominal pain: Secondary | ICD-10-CM | POA: Insufficient documentation

## 2010-11-17 DIAGNOSIS — F411 Generalized anxiety disorder: Secondary | ICD-10-CM

## 2010-11-17 LAB — POCT INR: INR: 4.6

## 2010-11-17 MED ORDER — IOHEXOL 300 MG/ML  SOLN
100.0000 mL | Freq: Once | INTRAMUSCULAR | Status: AC | PRN
  Administered 2010-11-17: 100 mL via INTRAVENOUS

## 2010-11-17 MED ORDER — WARFARIN SODIUM 4 MG PO TABS: ORAL_TABLET | ORAL | Status: DC

## 2010-11-17 NOTE — Patient Instructions (Signed)
If you experience worsening symptoms please go to the ED for further evaluation.

## 2010-11-17 NOTE — Progress Notes (Signed)
  Subjective:    Patient ID: [name], female    DOB: 1965/09/25, 45 y.o.   MRN: [id number]  HPI This is a 45 year old female with past medical history significant for A. fib status post multiple ablation, status post multiple unsuccessful cardioversion, status post ICD placement presented to the clinic with 2 week history of abdominal pain. The patient stated that on May 28 she experienced abdominal pain and noticed at the same time that her heart rate was acting funny. The abdominal pain was cramping in character, it was a feeling of bloating, worse with big meal, not associated with any bloody bowel movement, no constipation, no diarrhea, no nausea or vomiting, no fevers or chills, no changes in diet, no new medication. The discomfort continues to be present and she was experienced significant irregular heartbeat that she went to the ED on June 1 for further evaluation. In the ED UA, lipase, cemented was within normal limits. Chest x-ray was performed did not show any acute cardiopulmonary process. Cardiology was consulted and performed to DC cardioversion which was unsuccessful. The patient was started on propranolol. Patient then saw her cardiologist at [facility] on June 4 where she was found to be back in normal rhythm. Cardiologist discontinued diltiazem, propanolol and increased her Toprol 200 mg twice a day and Dofetilide to 375 mg twice a day. Patient noted that her abdominal pain is not that severe any more but she continues to have significant discomfort whenever eating large meals. So she is trying to reduce the amount and just eating liquid food which she is able to keep down. She underlies again that she does not have any bloody bowel movements.   Review of Systems  Constitutional: Negative for fever, chills, diaphoresis, appetite change and fatigue.  Respiratory: Negative for chest tightness and shortness of breath.   Cardiovascular: Negative for chest pain and palpitations.    Gastrointestinal: Positive for abdominal pain and abdominal distention. Negative for nausea, vomiting, diarrhea, constipation and blood in stool.  Genitourinary: Negative for dysuria, urgency, hematuria, flank pain, difficulty urinating, vaginal pain and pelvic pain.  Musculoskeletal: Negative for back pain and arthralgias.  Neurological: Negative for dizziness, weakness and numbness.       Objective:   Physical Exam  Constitutional: She is oriented to person, place, and time. She appears well-developed and well-nourished.  HENT:  Head: Normocephalic and atraumatic.  Eyes: Conjunctivae and EOM are normal. Pupils are equal, round, and reactive to light.  Neck: Neck supple.  Cardiovascular: Normal rate, regular rhythm and normal heart sounds.   Pulmonary/Chest: Effort normal.  Abdominal: Soft. Bowel sounds are normal. She exhibits no distension. There is no tenderness. There is no rebound and no guarding.  Musculoskeletal: Normal range of motion.  Neurological: She is alert and oriented to person, place, and time. She has normal reflexes.  Skin: Skin is warm and dry. No rash noted.          Assessment & Plan:

## 2010-11-17 NOTE — Progress Notes (Signed)
Pt aware of CT abd and pelvic at [facility] now - has order. Stanton Kidney Elysia Grand RN  11/17/10 5:10PM.

## 2010-11-18 MED ORDER — HYOSCYAMINE SULFATE 0.125 MG SL SUBL: 0.1250 mg | SUBLINGUAL_TABLET | SUBLINGUAL | Status: AC | PRN

## 2010-11-18 NOTE — Assessment & Plan Note (Addendum)
Abdomen pain is worse with food intake. Although patient is not describing any bloody stool and the pain is slightly improving it is concerning for ischemic bowel especially in the setting of history of uncontrolled A. Fib status post unsuccessful ablation and cardioversion. The patient is on Coumadin and has been therapeutic per records. Other differential diagnosis included irritable bowel disease although unlikely with no significant diarrhea or constipation history. Patient is denying any urinary changes. Does not note any pelvic pain. Considering the possibility of ischemic bowel we'll get a CT abdomen and pelvis with contrast for further evaluation.  The CT was performed and showed: 1.  Soft tissue prominence posterior to the uterus which could  represent a confluence of both ovaries, but a mass is difficult to exclude and pelvic ultrasound is recommended for further  evaluation.  2. Nonspecific adjacent enlarged right lower pelvic/perirectal lymph  node. If no other abnormalities are identified, consider CT follow-  up in 3-6 months.  3. Mild heterogeneity of the liver on the initial series which is probably related to phase of contrast enhancement. However  correlation with LFTs is recommended.  Plan:  1. liver function test on June 1 was within normal limits 2. Will refer the patient for a pelvic ultrasound for further evaluation. 3. Combivent as CT in 3-6 months to followup on pelvic/perirectal lymph node. 4. I prescribed the patient some anti-spasmodic for symptom relief.  Ultrasound 11/30/2010:  Ultrasound was performed which showed Fibroid uterus with fibroid sizes and location. Small subendometrial mural cyst suggests the possibility of  associated underlying adenomyosis. Normal ovaries with a complex avascular mass identified between the  ovaries in the cul-de-sac raises the possibility of a pelvic hematoma. Recommendation for MRI but patient has ICD in place. Discussed with Dr Kyung Rudd  and recommended now a follow up ultrasound in 8 weeks.     Patient was informed about the ultrasound result. Patient noted that there is no chance that she is pregnant. She noted today that she is having daily vaginal bleeding which is new for her. Her las period was in January. I referred the patient to be evaluated by her Gynecologist.

## 2010-11-18 NOTE — Assessment & Plan Note (Signed)
Patient is currently on Coumadin. Patient's INR during this office visit was 4.6. After discussion with Dr. Alexandria Lodge the patient was asked to hold her Coumadin dose on Friday and Saturday. She should resume her usual dose on Sunday and need to follow with Dr. Alexandria Lodge on Monday at the Coumadin clinic for repeat INR check and further recommendation.  Off note: Patient experienced recurrent A. fib and flutter on June 1 and was evaluated at the ED where she underwent unsuccessful cardioversion. She followed up with her primary cardiologist [facility] on June 4. He increased the dose of Tikosyn from 250 twice a day to 375 twice a day and increase metoprolol 100 mg daily.

## 2010-11-20 ENCOUNTER — Ambulatory Visit (INDEPENDENT_AMBULATORY_CARE_PROVIDER_SITE_OTHER): Payer: BC Managed Care – PPO | Admitting: Pharmacist

## 2010-11-20 ENCOUNTER — Other Ambulatory Visit: Payer: Self-pay | Admitting: Internal Medicine

## 2010-11-20 ENCOUNTER — Ambulatory Visit: Payer: BC Managed Care – PPO

## 2010-11-20 DIAGNOSIS — Z7901 Long term (current) use of anticoagulants: Secondary | ICD-10-CM

## 2010-11-20 DIAGNOSIS — I4891 Unspecified atrial fibrillation: Secondary | ICD-10-CM

## 2010-11-20 LAB — POCT INR: INR: 1.8

## 2010-11-20 NOTE — Progress Notes (Signed)
Message left on home phone ID recording [facility] pelvic US 11/29/10 8:45AM - Dr Loistine Chance aware. Pt needs 32oz water 1 hour before [name]. Stanton Kidney Vanessa Kampf RN  11/20/10 8:45AM

## 2010-11-20 NOTE — Progress Notes (Signed)
Anti-Coagulation Progress Note  [name] is a 45 y.o. female who is currently on an anti-coagulation regimen.    RECENT RESULTS: Recent results are below, the most recent result is correlated with a dose of 32 mg. per week: Lab Results  Component Value Date   INR 1.8 11/20/2010   INR 4.6 11/17/2010   INR 3.11* 11/10/2010    ANTI-COAG DOSE:   Latest dosing instructions   Total Sun Mon Tue Wed Thu Fri Sat   34 4 mg 6 mg 6 mg 6 mg 4 mg 4 mg 4 mg    (4 mg1) (4 mg1.5) (4 mg1.5) (4 mg1.5) (4 mg1) (4 mg1) (4 mg1)         ANTICOAG SUMMARY: Anticoagulation Episode Summary              Current INR goal 2.0-3.0 Next INR check 11/27/2010   INR from last check 1.8! (11/20/2010)     Weekly max dose (mg)  Target end date Indefinite   Indications Long-term (current) use of anticoagulants, A-fib   INR check location [facility] Preferred lab    Send INR reminders to ANTICOAG IMP   Comments        Provider Role Specialty Phone number   Farley Ly, MD  Internal Medicine [phone]        ANTICOAG TODAY: Anticoagulation Summary as of 11/20/2010              INR goal 2.0-3.0     Selected INR 1.8! (11/20/2010) Next INR check 11/27/2010   Weekly max dose (mg)  Target end date Indefinite   Indications Long-term (current) use of anticoagulants, A-fib    Anticoagulation Episode Summary              INR check location [facility] Preferred lab    Send INR reminders to ANTICOAG IMP   Comments        Provider Role Specialty Phone number   Farley Ly, MD  Internal Medicine [phone]        PATIENT INSTRUCTIONS: Patient Instructions  Patient instructed to take medications as defined in the Anti-coagulation Track section of this encounter.  Patient instructed to take today's dose.  Patient verbalized understanding of these instructions.        FOLLOW-UP Return in 7 days (on 11/27/2010) for Follow up INR.  Hulen Luster, III Pharm.D., CACP

## 2010-11-20 NOTE — Patient Instructions (Signed)
Patient instructed to take medications as defined in the Anti-coagulation Track section of this encounter.  Patient instructed to take today's dose.  Patient verbalized understanding of these instructions.    

## 2010-11-27 ENCOUNTER — Telehealth: Payer: Self-pay | Admitting: Internal Medicine

## 2010-11-27 ENCOUNTER — Ambulatory Visit (INDEPENDENT_AMBULATORY_CARE_PROVIDER_SITE_OTHER): Payer: BC Managed Care – PPO | Admitting: Pharmacist

## 2010-11-27 DIAGNOSIS — Z7901 Long term (current) use of anticoagulants: Secondary | ICD-10-CM

## 2010-11-27 DIAGNOSIS — I4891 Unspecified atrial fibrillation: Secondary | ICD-10-CM

## 2010-11-27 NOTE — Telephone Encounter (Signed)
Spoke with [name] who wants Dr Graciela Husbands to call her directly to discuss a surgery Dr Velta Addison at [facility] wants to do.  [name] states the best number to reach her between 9a and 4 pm is her work number.  Any other time she can be reached on her cell.  [name] aware I will forward this request to Dr Graciela Husbands.

## 2010-11-27 NOTE — Telephone Encounter (Signed)
Physician at [facility] is requesting [name] to have surgery . [name] want to get dr. Graciela Husbands opinion on this.

## 2010-11-27 NOTE — Patient Instructions (Signed)
Patient instructed to take medications as defined in the Anti-coagulation Track section of this encounter.  Patient instructed to take today's dose.  Patient verbalized understanding of these instructions.    

## 2010-11-27 NOTE — Progress Notes (Signed)
Anti-Coagulation Progress Note  [name] is a 45 y.o. female who is currently on an anti-coagulation regimen.    RECENT RESULTS: Recent results are below, the most recent result is correlated with a dose of 34 mg. per week: Lab Results  Component Value Date   INR 2.90 11/27/2010   INR 1.8 11/20/2010   INR 4.6 11/17/2010    ANTI-COAG DOSE:   Latest dosing instructions   Total Sun Mon Tue Wed Thu Fri Sat   32 4 mg 4 mg 6 mg 6 mg 4 mg 4 mg 4 mg    (4 mg1) (4 mg1) (4 mg1.5) (4 mg1.5) (4 mg1) (4 mg1) (4 mg1)         ANTICOAG SUMMARY: Anticoagulation Episode Summary              Current INR goal 2.0-3.0 Next INR check 12/18/2010   INR from last check 2.90 (11/27/2010)     Weekly max dose (mg)  Target end date Indefinite   Indications Long-term (current) use of anticoagulants, A-fib   INR check location [facility] Preferred lab    Send INR reminders to ANTICOAG IMP   Comments        Provider Role Specialty Phone number   Farley Ly, MD  Internal Medicine [phone]        ANTICOAG TODAY: Anticoagulation Summary as of 11/27/2010              INR goal 2.0-3.0     Selected INR 2.90 (11/27/2010) Next INR check 12/18/2010   Weekly max dose (mg)  Target end date Indefinite   Indications Long-term (current) use of anticoagulants, A-fib    Anticoagulation Episode Summary              INR check location [facility] Preferred lab    Send INR reminders to ANTICOAG IMP   Comments        Provider Role Specialty Phone number   Farley Ly, MD  Internal Medicine [phone]        PATIENT INSTRUCTIONS: Patient Instructions  Patient instructed to take medications as defined in the Anti-coagulation Track section of this encounter.  Patient instructed to take today's dose.  Patient verbalized understanding of these instructions.        FOLLOW-UP Return in 3 weeks (on 12/18/2010) for Follow up INR.  Hulen Luster, III Pharm.D., CACP

## 2010-11-29 ENCOUNTER — Other Ambulatory Visit (HOSPITAL_COMMUNITY): Payer: BC Managed Care – PPO

## 2010-11-29 ENCOUNTER — Ambulatory Visit (HOSPITAL_COMMUNITY): Payer: BC Managed Care – PPO

## 2010-11-29 NOTE — Telephone Encounter (Signed)
LM- no answer 

## 2010-11-30 ENCOUNTER — Ambulatory Visit (HOSPITAL_COMMUNITY)
Admission: RE | Admit: 2010-11-30 | Discharge: 2010-11-30 | Disposition: A | Discharge status: Home / Self Care | Payer: BC Managed Care – PPO | Source: Ambulatory Visit | Attending: Internal Medicine | Admitting: Internal Medicine

## 2010-11-30 ENCOUNTER — Encounter: Payer: Self-pay | Admitting: Internal Medicine

## 2010-11-30 ENCOUNTER — Other Ambulatory Visit (HOSPITAL_COMMUNITY): Payer: BC Managed Care – PPO

## 2010-11-30 ENCOUNTER — Telehealth: Payer: Self-pay | Admitting: Internal Medicine

## 2010-11-30 ENCOUNTER — Other Ambulatory Visit: Payer: Self-pay | Admitting: Internal Medicine

## 2010-11-30 DIAGNOSIS — D259 Leiomyoma of uterus, unspecified: Secondary | ICD-10-CM | POA: Insufficient documentation

## 2010-11-30 DIAGNOSIS — N831 Corpus luteum cyst of ovary, unspecified side: Secondary | ICD-10-CM | POA: Insufficient documentation

## 2010-11-30 DIAGNOSIS — R935 Abnormal findings on diagnostic imaging of other abdominal regions, including retroperitoneum: Secondary | ICD-10-CM

## 2010-11-30 NOTE — Telephone Encounter (Signed)
Returning call back from dr. Graciela Husbands on last night.

## 2010-11-30 NOTE — Progress Notes (Signed)
Addended by: Almyra Deforest on: 11/30/2010 04:27 PM   Modules accepted: Orders

## 2010-11-30 NOTE — Progress Notes (Signed)
Rad called me about abnl U/S results and rec MRI. See the [name] report for details. Basically think this might be hematoma from ruptured ovarian cyst but want to R/O ovarian mass 2/2 age.  I forgot that pt had ICD so was unable to discuss that with the radiologist.   1. Please verify with pt that not pregnant 2. I included in order that pt had ICD - may cancel the exam  3. If order cancelled, may just need to follow with U/S - may need to discuss plan with PCP and rad

## 2010-11-30 NOTE — Assessment & Plan Note (Signed)
Patient was informed about the ultrasound result. Patient noted that there is no chance that she is pregnant. She noted today that she is having daily vaginal bleeding which is new for her. Her las period was in January. I referred the patient to be evaluated by her Gynecologist.   Ultrasound was performed which showed Fibroid uterus with fibroid sizes and location. Small subendometrial mural cyst suggests the possibility of  associated underlying adenomyosis. Normal ovaries with a complex avascular mass identified between the  ovaries in the cul-de-sac raises the possibility of a pelvic hematoma. Recommendation for MRI but patient has ICD in place. Discussed with Dr Kennedy and recommended now a follow up ultrasound in 8 weeks.    

## 2010-11-30 NOTE — Telephone Encounter (Signed)
RN reviewed with Amber; unable to Cardiovert in ER. Amber will f/u with Pt.

## 2010-11-30 NOTE — Telephone Encounter (Signed)
Pt returned to SR spontaneously Sunday night/Monday morning after attempted DCCV on June 1st.  She saw Dr Macon Large that day to discuss treatment options.  He said he would get in touch with her about treatment options.  Dr Macon Large called her earlier today, but missed her, and is going to call back this afternoon.  Pt would like Dr Odessa Fleming opinion on convergent ablation vs 5 box MAZE procedure done at [facility].  Dr Macon Large told pt that he thinks arrhythmias are likely epicardial. Pt has been back in atrial fibrillation since Sunday.  Will send message to Dr Graciela Husbands to review.

## 2010-11-30 NOTE — Telephone Encounter (Signed)
Patient was informed about the ultrasound result. Patient noted that there is no chance that she is pregnant. She noted today that she is having daily vaginal bleeding which is new for her. Her las period was in January. I referred the patient to be evaluated by her Gynecologist.   Ultrasound was performed which showed Fibroid uterus with fibroid sizes and location. Small subendometrial mural cyst suggests the possibility of  associated underlying adenomyosis. Normal ovaries with a complex avascular mass identified between the  ovaries in the cul-de-sac raises the possibility of a pelvic hematoma. Recommendation for MRI but patient has ICD in place. Discussed with Dr Kyung Rudd and recommended now a follow up ultrasound in 8 weeks.

## 2010-12-01 ENCOUNTER — Other Ambulatory Visit: Payer: BC Managed Care – PPO

## 2010-12-01 ENCOUNTER — Ambulatory Visit (HOSPITAL_COMMUNITY)
Admission: RE | Admit: 2010-12-01 | Discharge: 2010-12-01 | Disposition: A | Discharge status: Home / Self Care | Payer: BC Managed Care – PPO | Source: Ambulatory Visit | Attending: Internal Medicine | Admitting: Internal Medicine

## 2010-12-01 ENCOUNTER — Ambulatory Visit (INDEPENDENT_AMBULATORY_CARE_PROVIDER_SITE_OTHER): Payer: BC Managed Care – PPO | Admitting: *Deleted

## 2010-12-01 ENCOUNTER — Other Ambulatory Visit: Payer: Self-pay | Admitting: Internal Medicine

## 2010-12-01 DIAGNOSIS — I4891 Unspecified atrial fibrillation: Secondary | ICD-10-CM

## 2010-12-01 NOTE — Telephone Encounter (Signed)
Dr. Graciela Husbands called and spoke with the patient. He states she will call back on Monday and let us know if she wants a referral to Dr. Hurman Horn at [facility].

## 2010-12-02 LAB — CBC
HCT: 38.9 % (ref 36.0–46.0)
MCH: 27.9 pg (ref 26.0–34.0)
MCHC: 31.1 g/dL (ref 30.0–36.0)
Platelets: 317 10*3/uL (ref 150–400)
RBC: 4.34 MIL/uL (ref 3.87–5.11)
RDW: 13.9 % (ref 11.5–15.5)

## 2010-12-04 ENCOUNTER — Telehealth: Payer: Self-pay | Admitting: Internal Medicine

## 2010-12-04 NOTE — Telephone Encounter (Signed)
Dr Graciela Husbands was to refer pt to [facility] dr Ellis Savage or dr Helene Kelp, pt to call when ready to schedule a conversion, she would like to schedule asap, until 5p [phone] after 5p [phone]

## 2010-12-05 ENCOUNTER — Ambulatory Visit (HOSPITAL_COMMUNITY)
Admission: RE | Admit: 2010-12-05 | Discharge: 2010-12-05 | Disposition: A | Discharge status: Home / Self Care | Payer: BC Managed Care – PPO | Source: Ambulatory Visit | Attending: Internal Medicine | Admitting: Internal Medicine

## 2010-12-05 ENCOUNTER — Ambulatory Visit: Payer: BC Managed Care – PPO

## 2010-12-05 DIAGNOSIS — I4891 Unspecified atrial fibrillation: Secondary | ICD-10-CM

## 2010-12-05 DIAGNOSIS — R9431 Abnormal electrocardiogram [ECG] [EKG]: Secondary | ICD-10-CM | POA: Insufficient documentation

## 2010-12-05 NOTE — Telephone Encounter (Signed)
Will send message to [facility]--nt

## 2010-12-05 NOTE — Telephone Encounter (Signed)
Pt rtn call from debra yesterday-pls call

## 2010-12-07 NOTE — Telephone Encounter (Signed)
I spoke with the patient and made her aware that Dr. Graciela Husbands is trying to reach Dr. Hurman Horn at [facility]. He is out on vacation this week and we hope to hear from him next week. I will call her back when I know something further about her referral. She is agreeable. She states she will be at her work # M-Th next week. I can leave a message on her work voice mail or her cell.

## 2010-12-11 ENCOUNTER — Ambulatory Visit (INDEPENDENT_AMBULATORY_CARE_PROVIDER_SITE_OTHER): Payer: BC Managed Care – PPO

## 2010-12-11 DIAGNOSIS — I4891 Unspecified atrial fibrillation: Secondary | ICD-10-CM

## 2010-12-11 DIAGNOSIS — Z7901 Long term (current) use of anticoagulants: Secondary | ICD-10-CM

## 2010-12-11 LAB — POCT INR: INR: 3.5

## 2010-12-15 NOTE — Telephone Encounter (Signed)
Dr. Graciela Husbands received a voice mail from Dr. Hurman Horn today trying to speak with him about Ms. Weisel. He wants to talk with Dr. Graciela Husbands. I will fl/u next week to see if they have spoken.

## 2010-12-18 ENCOUNTER — Telehealth: Payer: Self-pay | Admitting: Pharmacist

## 2010-12-18 ENCOUNTER — Other Ambulatory Visit: Payer: BC Managed Care – PPO

## 2010-12-18 ENCOUNTER — Ambulatory Visit: Payer: BC Managed Care – PPO

## 2010-12-18 NOTE — Telephone Encounter (Signed)
Patient reports:  6-Jul-12 INR = 3.3 on 4mg  warfarin; 7-Jul-12 INR = 3.4 given 3mg  warfarin; 8-Jul-12 INR = 2.6 given 4mg  warfarin; 9-Jul-12 INR = 2.2. Patient advised to use last regimen we had used successfully, i.e. 4mg  warfarin daily except on Sunday(s)--take 1 and 1/2 x 4mg  = 6mg  warfarin. Repeat until seen by me in [facility] on 30-Jul-12 at 1630h.

## 2010-12-19 ENCOUNTER — Other Ambulatory Visit: Payer: Self-pay | Admitting: Internal Medicine

## 2010-12-27 ENCOUNTER — Ambulatory Visit (INDEPENDENT_AMBULATORY_CARE_PROVIDER_SITE_OTHER): Payer: BC Managed Care – PPO | Admitting: Internal Medicine

## 2010-12-27 ENCOUNTER — Encounter: Payer: Self-pay | Admitting: Internal Medicine

## 2010-12-27 DIAGNOSIS — R1900 Intra-abdominal and pelvic swelling, mass and lump, unspecified site: Secondary | ICD-10-CM

## 2010-12-27 DIAGNOSIS — Z7901 Long term (current) use of anticoagulants: Secondary | ICD-10-CM

## 2010-12-27 DIAGNOSIS — I4891 Unspecified atrial fibrillation: Secondary | ICD-10-CM

## 2010-12-27 DIAGNOSIS — I4892 Unspecified atrial flutter: Secondary | ICD-10-CM

## 2010-12-27 LAB — CBC WITH DIFFERENTIAL/PLATELET
Lymphocytes Relative: 22 % (ref 12–46)
Lymphs Abs: 1.3 10*3/uL (ref 0.7–4.0)
Neutrophils Relative %: 70 % (ref 43–77)
Platelets: 273 10*3/uL (ref 150–400)
RBC: 4.32 MIL/uL (ref 3.87–5.11)
WBC: 5.9 10*3/uL (ref 4.0–10.5)

## 2010-12-27 LAB — MAGNESIUM: Magnesium: 2.1 mg/dL (ref 1.5–2.5)

## 2010-12-27 NOTE — Assessment & Plan Note (Signed)
Pelvic ultrasound done on 11/30/10 to evaluate abdominal pain showed,  positioned between the ovaries and contiguous with the ovaries, a complex mass demonstrating posterior acoustical enhancement and no definite intralesional flow with color Doppler exam measuring 5.2 x 3.1 x 5.5 cm. The appearance raised the possibility of a ruptured ovarian cyst with subsequent development of a hematoma.  MRI was recommended to further evaluate, but this could not be done due to the patient's AICD.  A copy of the imaging report was sent to patient's gynecologist.  I advised patient to schedule an appointment with her gynecologist in order to discuss this finding.  A repeat CT scan of the abdomen and pelvis may be advisable, but I will defer this decision pending assessment by her gynecologist.  Patient currently has no abdominal pain or other symptoms.

## 2010-12-27 NOTE — Patient Instructions (Addendum)
Please schedule an appointment with your gynecologist to review the results of the pelvic ultrasound done in June. Please keep follow-up appointments for mammogram and Pap smear.

## 2010-12-27 NOTE — Assessment & Plan Note (Addendum)
Patient reports that she has been back in atrial fibrillation since July 12 following a cardioversion on July 9.  She has notified Dr. Macon Large at [facility], and reports that he prefers to control her rate at present rather than performing another cardioversion.  Her rate at rest today is between 104 and 110, and she is tolerating this symptomatically.  Her blood pressure is in the low normal range, but she denies any dizziness.  She has a home blood pressure monitor and does follow her blood pressure.  I advised her to notify her cardiologist and our clinic if she develops any dizziness or other symptoms, or if her systolic pressures are running below 90.  She will continue her current medication regimen as per Dr. Macon Large, and will follow up with him and with Dr. Sherryl Manges as planned.

## 2010-12-27 NOTE — Progress Notes (Signed)
  Subjective:    Patient ID: [name], female    DOB: 1965/08/31, 45 y.o.   MRN: [id number]  HPI Patient returns for followup of her atrial fibrillation, GERD, anxiety, and other chronic medical problems.  Since her last visit here, she underwent a repeated ablation procedure at [facility] on March 1, and then because of recurrent episodes of atrial flutter/fibrillation was admitted there on July 6 for an increase in her Tikosyn dose.  She reports that she was cardioverted on July 9, but then was back in atrial flutter/fibrillation on July 12 and has remained out of sinus rhythm since.  She is symptomatically aware when she is out of sinus rhythm, but mainly experiences discomfort when her heart rate is above 130.  She reports that her EP specialist Dr. Macon Large at [facility] has instructed her to adjust her medications when she is out of sinus rhythm by increasing her Toprol XL to a dose of 125 mg twice a day, increasing her Cardizem to a dose of 120 mg twice a day, and using Inderal 20 mg every 4-6 hours as needed.  She has recently been taking the increased dose of Toprol and Cardizem, and using the Inderal intermittently.  She reports that he she uses Xanax only occasionally.     Review of Systems  Constitutional: Negative for fever, chills, diaphoresis and appetite change.  Respiratory: Negative for cough, shortness of breath and wheezing.   Cardiovascular: Positive for palpitations. Negative for chest pain and leg swelling.  Gastrointestinal: Negative for nausea, vomiting, abdominal pain, diarrhea and blood in stool.  Genitourinary: Negative for dysuria, vaginal bleeding, difficulty urinating, menstrual problem and pelvic pain.       Objective:   Physical Exam  Constitutional: No distress.  Cardiovascular: S1 normal and S2 normal.  An irregularly irregular rhythm present. Tachycardia present.        Apical rate 104-110  Pulmonary/Chest: Effort normal and breath sounds normal. No respiratory  distress. She has no wheezes. She has no rales.  Abdominal: Soft. Bowel sounds are normal. She exhibits no distension and no mass. There is no tenderness. There is no rebound and no guarding.  Musculoskeletal: She exhibits no edema.  Skin: She is not diaphoretic.          Assessment & Plan:

## 2010-12-27 NOTE — Assessment & Plan Note (Signed)
Will check an INR today.

## 2010-12-28 LAB — COMPLETE METABOLIC PANEL WITH GFR
AST: 20 U/L (ref 0–37)
BUN: 10 mg/dL (ref 6–23)
CO2: 29 mEq/L (ref 19–32)
Calcium: 9.5 mg/dL (ref 8.4–10.5)
Chloride: 106 mEq/L (ref 96–112)
Creat: 0.78 mg/dL (ref 0.50–1.10)
GFR, Est African American: >60 mL/min (ref >60)

## 2010-12-28 NOTE — Consult Note (Signed)
  NAMEREEM, [name] NO.:  [id number]  MEDICAL RECORD NO.:  [id number]           PATIENT TYPE:  E  LOCATION:  [facility]                         FACILITY:  [facility]  PHYSICIAN:  Doylene Canning. Ladona Ridgel, MD    DATE OF BIRTH:  [birth year]  DATE OF CONSULTATION:  11/10/2010 DATE OF DISCHARGE:                                CONSULTATION   CONSULTATION REQUESTED BY:  Dr. Fonnie Jarvis.  INDICATION FOR CONSULTATION:  Evaluation of recurrent atrial fib and flutter.  HISTORY OF PRESENT ILLNESS:  The patient is a 45 year old woman with recurrent atrial fibrillation and flutter.  She has a history of multiple cardioversions, multiple ablations, and multiple antiarrhythmic drugs.  She was in her usual state of health until 2 days ago when she was found be back in atrial fibrillation.  She has been therapeutic with her anticoagulation.  In addition, she has a history of hypertrophic cardiomyopathy.  She has been chronically anticoagulated with warfarin. She denies syncope.  She denies anginal symptoms.  Her past medical history is notable for reflux disease as well as chronic anemia, seasonal allergic rhinitis, and nonsustained VT.  PAST SURGICAL HISTORY:  Notable for cholecystectomy.  FAMILY HISTORY:  Negative for premature coronary disease.  REVIEW OF SYSTEMS:  Negative except as noted in HPI.  PHYSICAL EXAMINATION:  GENERAL:  She is a pleasant well-appearing middle- aged woman in no acute distress. VITAL SIGNS:  Her blood pressure was 95/60, the pulse was 80-100 and irregular, respirations were 18, temperature is 98. HEENT:  Normocephalic, atraumatic.  Pupils equal, round.  Oropharynx is moist.  Sclerae anicteric. NECK:  Revealed no jugular venous distention. LUNGS:  Clear bilaterally to auscultation.  No wheezes, rales, or rhonchi. CARDIOVASCULAR:  Irregularly rhythm, tachycardia with normal S1 and S2. ABDOMEN:  Soft, nontender, nondistended.  There is no  organomegaly. EXTREMITIES:  Demonstrates no cyanosis, clubbing, or edema.  Pulses are 2+ and symmetric. NEUROLOGIC:  Alert and oriented x3 with cranial nerves intact.  Strength is 5/5 and nonfocal.  Otherwise EKG demonstrates atrial fibrillation with a rapid ventricular response/controlled ventricular response.  Labs are unremarkable except for potassium of 3.8.  Her INR today was 3.11.  Prior INRs have been above 2.  IMPRESSION: 1. Recurrent atrial fibrillation/flutter. 2. History of multiple failed antiarrhythmic drugs.  PLAN:  Will be to proceed with DC cardioversion.  The risks, benefits, goals, and expectations of procedure have been discussed and she wishes to proceed.     Doylene Canning. Ladona Ridgel, MD     GWT/MEDQ  D:  11/10/2010  T:  11/11/2010  Job:  [id number]  Electronically Signed by Lewayne Bunting MD on 12/28/2010 08:30:59 AM

## 2010-12-28 NOTE — Op Note (Signed)
  [name], DAHLEM NO.:  [id number]  MEDICAL RECORD NO.:  [id number]           PATIENT TYPE:  E  LOCATION:  [facility]                         FACILITY:  [facility]  PHYSICIAN:  Doylene Canning. Ladona Ridgel, MD    DATE OF BIRTH:  12/24/1965  DATE OF PROCEDURE:  11/10/2010 DATE OF DISCHARGE:                              OPERATIVE REPORT   PROCEDURE PERFORMED:  DC cardioversion.  INDICATIONS:  Symptomatic atrial fibrillation and flutter.  INTRODUCTION:  The patient is a very pleasant 45 year old woman with hypertrophic cardiomyopathy and persistent atrial fibrillation.  She has undergone multiple atrial ablations.  She continues to have problems with atrial fibrillation.  She was cardioverted 2 weeks ago at [facility] with some difficulty and returns today after having 1 day of recurrent atrial fibrillation and flutter.  The patient has been therapeutic with her anticoagulation.  PROCEDURE:  After informed consent was obtained, the patient was taken to the diagnostic lab where the patient was sedated in the usual manner with etomidate under the direction of Dr. Fonnie Jarvis.  The patient's electrode dispersive pads were placed in the anterior-posterior position.  After satisfactory sedation, DC cardioversion was attempted with 200 joules of synchronized biphasic energy applied to the electrode dispersive pads in the anterior-posterior position.  The patient has had early return of atrial fibrillation x2.  After this the procedure was abandoned.  She will be allowed to wake up and go home.  She has followup with Dr. Macon Large at [facility]  COMPLICATIONS:  There were no immediate procedural complications.  RESULTS:  This demonstrates unsuccessful DC cardioversion of a patient with symptomatic atrial fibrillation and flutter despite multiple attempts.     Doylene Canning. Ladona Ridgel, MD     GWT/MEDQ  D:  11/10/2010  T:  11/11/2010  Job:   [id number]  cc:   Val Riles, MD  Electronically Signed by Lewayne Bunting MD on 12/28/2010 08:31:04 AM

## 2011-01-10 ENCOUNTER — Ambulatory Visit (INDEPENDENT_AMBULATORY_CARE_PROVIDER_SITE_OTHER): Payer: BC Managed Care – PPO

## 2011-01-10 DIAGNOSIS — I4891 Unspecified atrial fibrillation: Secondary | ICD-10-CM

## 2011-01-10 DIAGNOSIS — Z7901 Long term (current) use of anticoagulants: Secondary | ICD-10-CM

## 2011-01-10 LAB — POCT INR: INR: 3.3

## 2011-01-11 ENCOUNTER — Telehealth: Payer: Self-pay | Admitting: Pharmacist

## 2011-01-11 NOTE — Telephone Encounter (Signed)
Patient was called with the results of her INR which had been left for me as a voicemail. INR = 3.3 on 30mg  warfarin per week. Using 4mg  tablets:  1 tablet Monday-Saturday and 1 and 1/2 tablet (6mg ) on Sunday of each week. She will continue this regimen. She endorses NO signs or symptoms suggestive of bleeding. Will re-evaluate INR on 20-Aug-12 at 4:30PM.

## 2011-01-30 ENCOUNTER — Other Ambulatory Visit (INDEPENDENT_AMBULATORY_CARE_PROVIDER_SITE_OTHER): Payer: BC Managed Care – PPO

## 2011-01-30 DIAGNOSIS — Z7901 Long term (current) use of anticoagulants: Secondary | ICD-10-CM

## 2011-01-30 DIAGNOSIS — I4891 Unspecified atrial fibrillation: Secondary | ICD-10-CM

## 2011-01-30 LAB — POCT INR: INR: 3.3

## 2011-02-07 ENCOUNTER — Other Ambulatory Visit: Payer: Self-pay | Admitting: Internal Medicine

## 2011-02-26 ENCOUNTER — Ambulatory Visit (INDEPENDENT_AMBULATORY_CARE_PROVIDER_SITE_OTHER): Payer: BC Managed Care – PPO | Admitting: Pharmacist

## 2011-02-26 DIAGNOSIS — I4891 Unspecified atrial fibrillation: Secondary | ICD-10-CM

## 2011-02-26 DIAGNOSIS — Z7901 Long term (current) use of anticoagulants: Secondary | ICD-10-CM

## 2011-02-26 LAB — POCT INR: INR: 2.1

## 2011-02-26 NOTE — Patient Instructions (Signed)
Patient instructed to take medications as defined in the Anti-coagulation Track section of this encounter.  Patient instructed to take today's dose. You will DISCONTINUE WARFARIN on Friday 21-SEPT-12 at the direction of [facility]. Patient will visit [facility] on Tuesday 25-SEPT-12. Procedure is for Wednesday 26-SEPT-12 at [facility]. Patient verbalized understanding of these instructions.

## 2011-02-26 NOTE — Progress Notes (Signed)
Anti-Coagulation Progress Note  [name] is a 45 y.o. female who is currently on an anti-coagulation regimen.    RECENT RESULTS: Recent results are below, the most recent result is correlated with a dose of 32 mg. per week: Lab Results  Component Value Date   INR 2.10 02/26/2011   INR 3.3 01/30/2011   INR 3.3 01/10/2011    ANTI-COAG DOSE:   Latest dosing instructions   Total Sun Mon Tue Wed Thu Fri Sat   34 6 mg 6 mg 6 mg 4 mg 4 mg 4 mg 4 mg    (4 mg1.5) (4 mg1.5) (4 mg1.5) (4 mg1) (4 mg1) (4 mg1) (4 mg1)         ANTICOAG SUMMARY: Anticoagulation Episode Summary              Current INR goal 2.0-3.0 Next INR check 03/07/2011   INR from last check 2.10 (02/26/2011)     Weekly max dose (mg)  Target end date Indefinite   Indications Long-term (current) use of anticoagulants, A-fib (Resolved)   INR check location [facility] Preferred lab    Send INR reminders to ANTICOAG IMP   Comments        Provider Role Specialty Phone number   Farley Ly, MD  Internal Medicine [phone]        ANTICOAG TODAY: Anticoagulation Summary as of 02/26/2011              INR goal 2.0-3.0     Selected INR 2.10 (02/26/2011) Next INR check 03/07/2011   Weekly max dose (mg)  Target end date Indefinite   Indications Long-term (current) use of anticoagulants, A-fib (Resolved)    Anticoagulation Episode Summary              INR check location [facility] Preferred lab    Send INR reminders to ANTICOAG IMP   Comments        Provider Role Specialty Phone number   Farley Ly, MD  Internal Medicine [phone]        PATIENT INSTRUCTIONS: Patient Instructions  Patient instructed to take medications as defined in the Anti-coagulation Track section of this encounter.  Patient instructed to take today's dose. You will DISCONTINUE WARFARIN on Friday 21-SEPT-12 at the direction of [facility]. Patient will visit [facility] on Tuesday 25-SEPT-12. Procedure  is for Wednesday 26-SEPT-12 at [facility]. Patient verbalized understanding of these instructions.        FOLLOW-UP Return in 9 days (on 03/07/2011) for Follow up INR.  Hulen Luster, III Pharm.D., CACP

## 2011-03-01 IMAGING — CR DG CHEST 2V
2 series · 2 of 2 positions shown · non-contrast
Comparison: 04/04/2010

CLINICAL DATA: Palpitations

CHEST - 2 VIEW

[w chest pa]
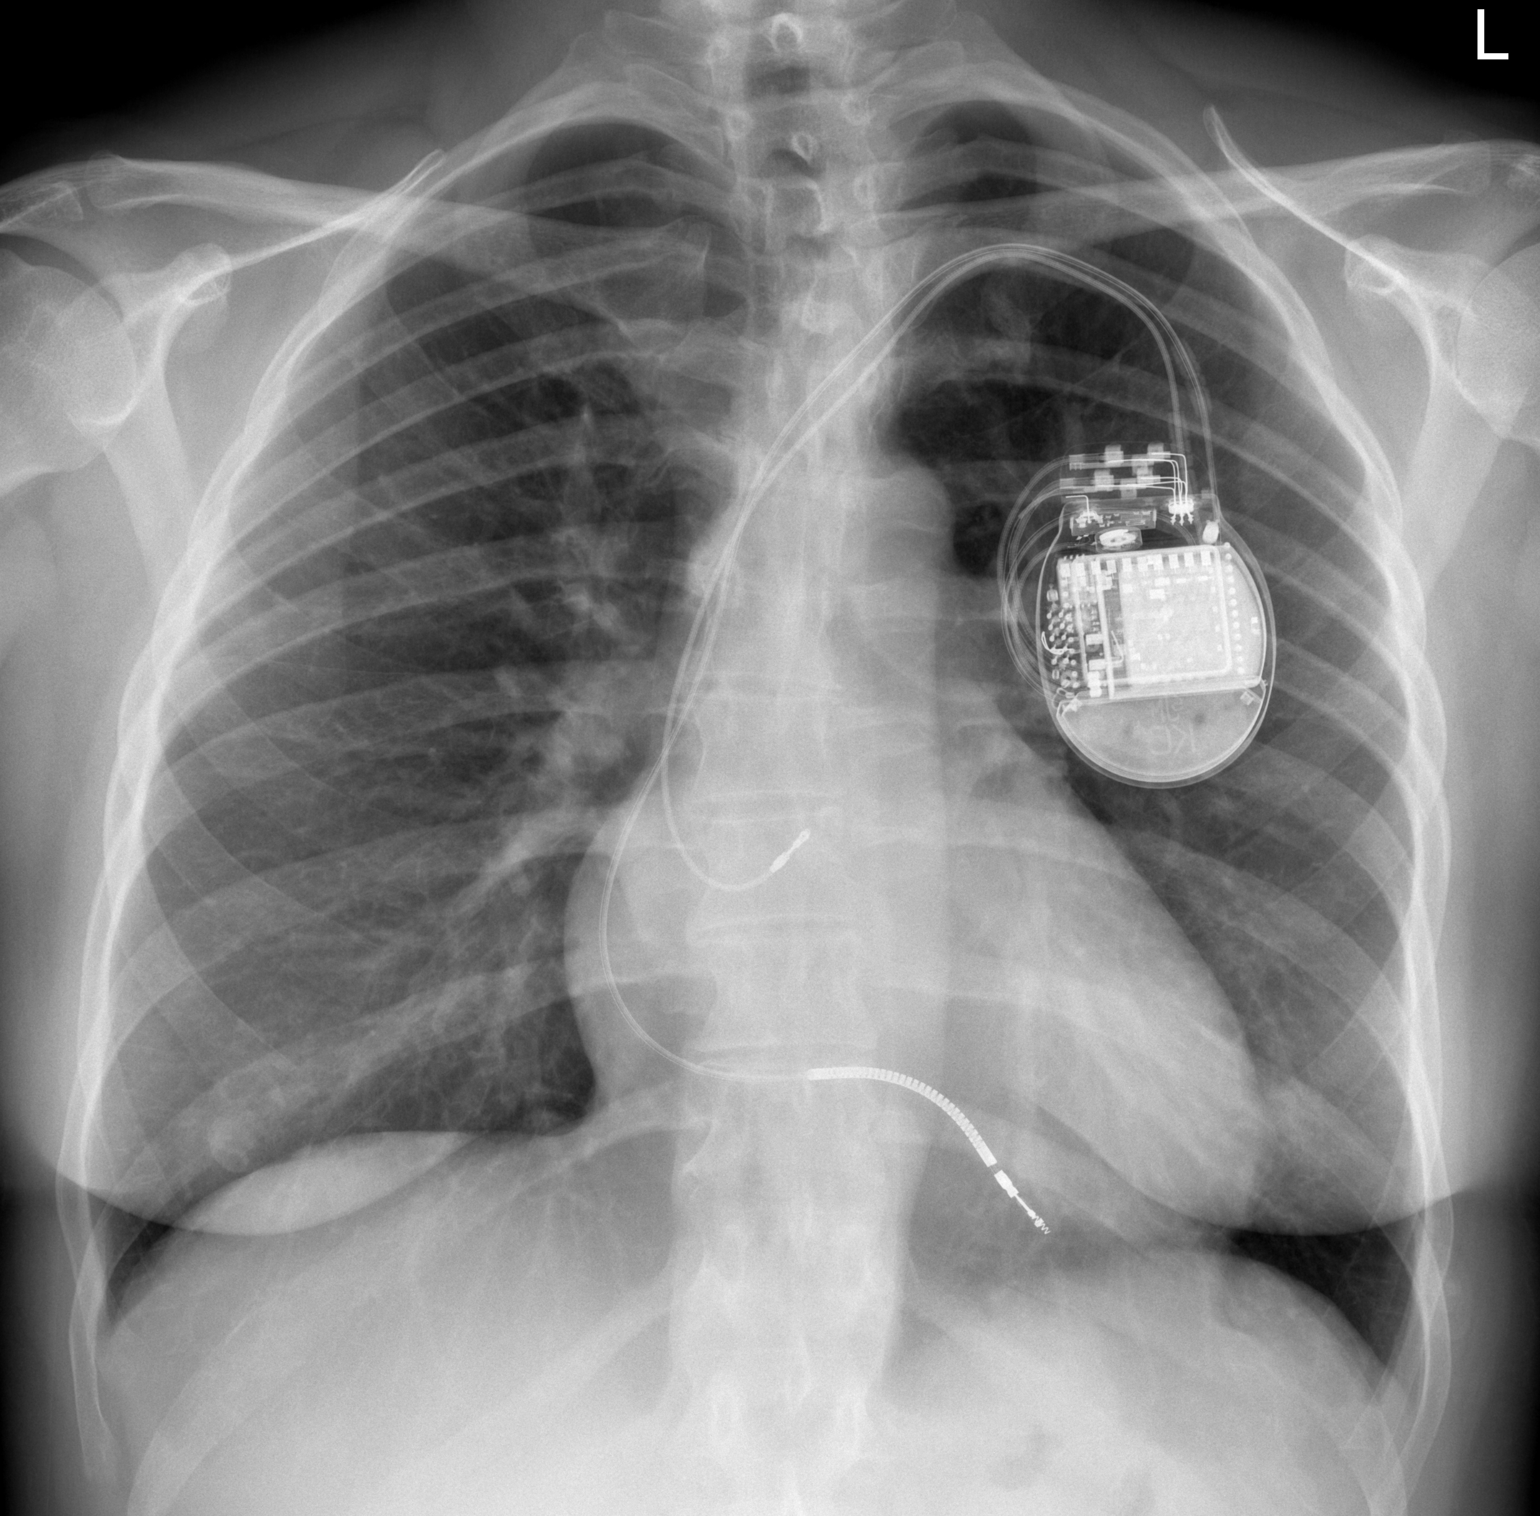

[w chest lat]
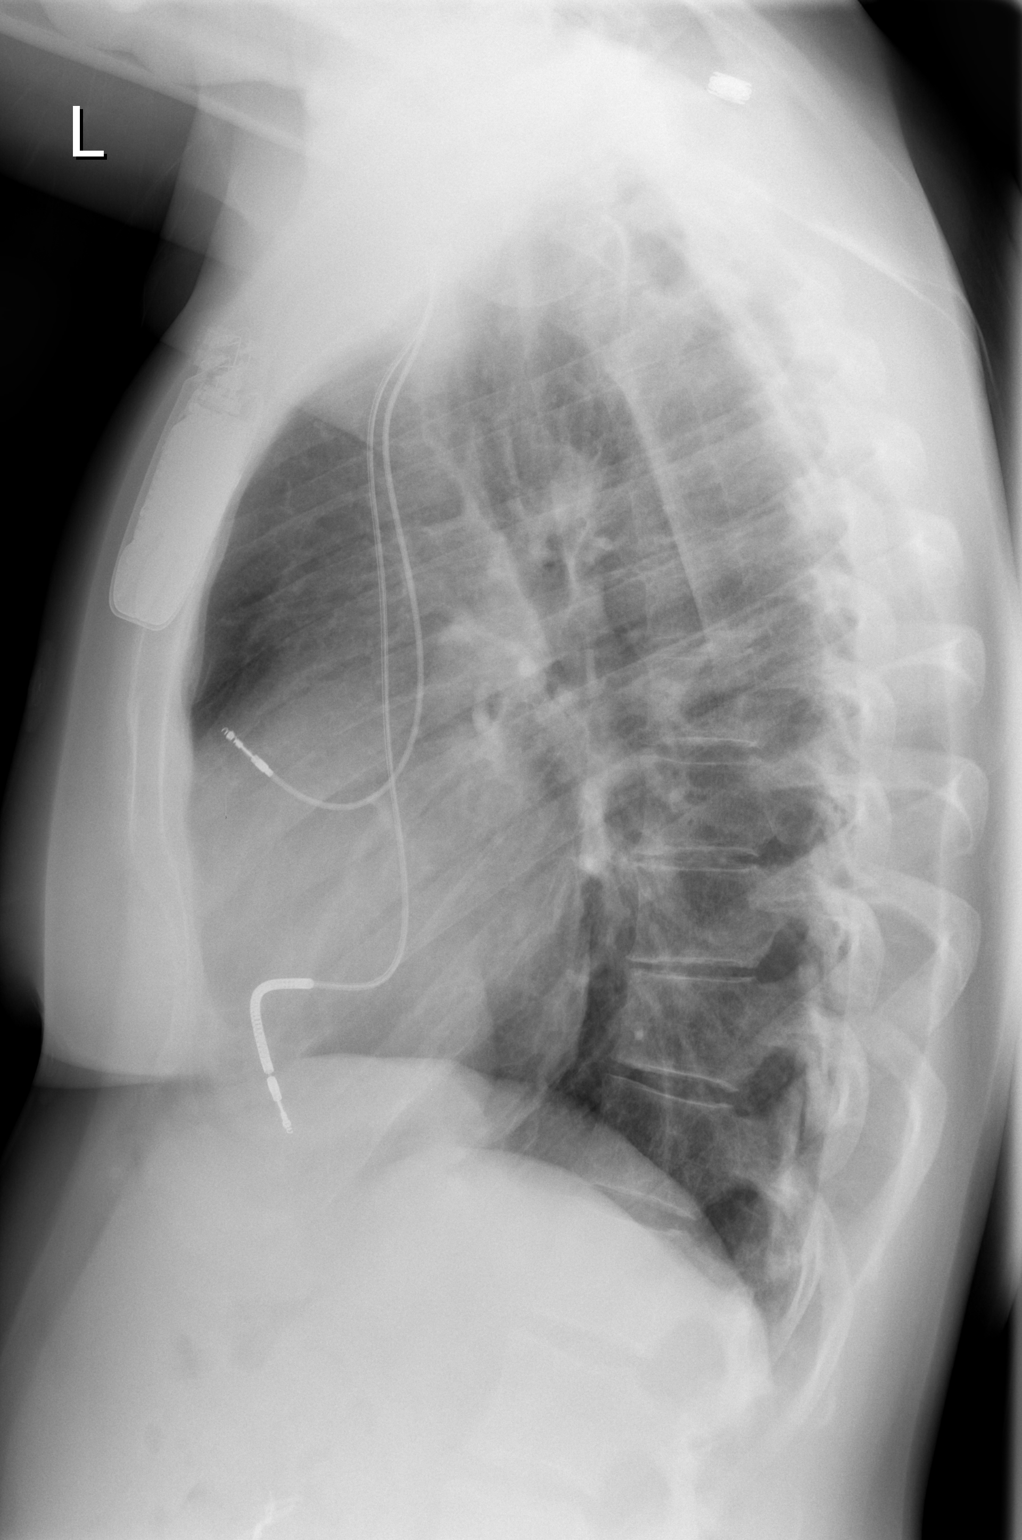

[2 of 2 positions shown; findings below may reference images not displayed]

FINDINGS: There is a left chest wall pacer device with lead in the
right atrial appendage and right ventricle.

No pleural effusion or pulmonary edema noted.

No airspace consolidation identified.
IMPRESSION: 1.  No active cardiopulmonary abnormalities.

## 2011-03-05 ENCOUNTER — Other Ambulatory Visit: Payer: Self-pay | Admitting: *Deleted

## 2011-03-05 LAB — BASIC METABOLIC PANEL
BUN: 11
CO2: 24
CO2: 25
Calcium: 9.1
Chloride: 106
Chloride: 108
Creatinine, Ser: 0.87
GFR calc Af Amer: >60
Glucose, Bld: 100 — ABNORMAL HIGH
Potassium: 4.1

## 2011-03-05 LAB — COMPREHENSIVE METABOLIC PANEL
ALT: 22
AST: 22
Alkaline Phosphatase: 54
CO2: 30
Chloride: 105
Creatinine, Ser: 0.88
GFR calc Af Amer: >60
GFR calc non Af Amer: >60
Potassium: 4.1
Total Bilirubin: 0.6

## 2011-03-05 LAB — URINALYSIS, ROUTINE W REFLEX MICROSCOPIC
Glucose, UA: NEGATIVE
Hgb urine dipstick: NEGATIVE
Specific Gravity, Urine: 1.014
pH: 7

## 2011-03-05 LAB — CBC
HCT: 37.8
HCT: 38.2
Hemoglobin: 12.8
MCHC: 33.8
MCV: 89.6
MCV: 89.7
MCV: 89.8
RBC: 4.25
RBC: 4.34
RDW: 13.2
WBC: 9.1
WBC: 9.7

## 2011-03-05 LAB — CARDIAC PANEL(CRET KIN+CKTOT+MB+TROPI)
CK, MB: 1.9
CK, MB: 2.1
CK, MB: 2.1
Relative Index: INVALID
Total CK: 35
Troponin I: 0.03
Troponin I: 0.04
Troponin I: 0.05

## 2011-03-05 LAB — T4, FREE: Free T4: 1.22

## 2011-03-05 LAB — TSH: TSH: 2.551

## 2011-03-05 LAB — PROTIME-INR: INR: 1.1

## 2011-03-05 LAB — T3, FREE: T3, Free: 3.2 (ref 2.3–4.2)

## 2011-03-05 MED ORDER — DILTIAZEM HCL ER 120 MG PO CP24: 120.0000 mg | ORAL_CAPSULE | Freq: Every day | ORAL | Status: DC

## 2011-03-06 LAB — BASIC METABOLIC PANEL
CO2: 22
Chloride: 108
Glucose, Bld: 102 — ABNORMAL HIGH
Potassium: 4
Sodium: 140

## 2011-03-06 LAB — CBC
HCT: 34.6 — ABNORMAL LOW
Hemoglobin: 12.1
MCHC: 35.1
RDW: 12.9

## 2011-03-07 ENCOUNTER — Ambulatory Visit: Payer: BC Managed Care – PPO

## 2011-03-07 HISTORY — PX: OTHER SURGICAL HISTORY: SHX169

## 2011-03-08 ENCOUNTER — Encounter: Payer: Self-pay | Admitting: Internal Medicine

## 2011-03-08 LAB — BASIC METABOLIC PANEL
BUN: 7
CO2: 27
Chloride: 103
Creatinine, Ser: 0.97
GFR calc Af Amer: >60
Potassium: 3.5

## 2011-03-08 LAB — CBC
HCT: 41.6
MCHC: 33.1
MCV: 88.2
RBC: 4.72

## 2011-03-08 LAB — POCT CARDIAC MARKERS
CKMB, poc: 1.1
Operator id: 294591
Troponin i, poc: <0.05

## 2011-03-08 LAB — DIFFERENTIAL
Basophils Absolute: 0
Basophils Relative: 0
Eosinophils Absolute: 0.1
Eosinophils Relative: 1
Lymphocytes Relative: 21

## 2011-03-12 ENCOUNTER — Ambulatory Visit: Payer: BC Managed Care – PPO | Admitting: Internal Medicine

## 2011-03-15 ENCOUNTER — Encounter: Payer: Self-pay | Admitting: Internal Medicine

## 2011-03-16 ENCOUNTER — Encounter: Payer: Self-pay | Admitting: Internal Medicine

## 2011-03-16 DIAGNOSIS — I4891 Unspecified atrial fibrillation: Secondary | ICD-10-CM

## 2011-03-16 DIAGNOSIS — I4892 Unspecified atrial flutter: Secondary | ICD-10-CM

## 2011-03-16 NOTE — Progress Notes (Signed)
I received a copy of discharge summary from [facility] documenting hospitalization there 9/26-10/01/12; patient underwent Convergent Procedure for ablation on 03/07/11.  Medications should be reconciled at next visit.

## 2011-03-20 ENCOUNTER — Other Ambulatory Visit (INDEPENDENT_AMBULATORY_CARE_PROVIDER_SITE_OTHER): Payer: BC Managed Care – PPO

## 2011-03-20 DIAGNOSIS — I4891 Unspecified atrial fibrillation: Secondary | ICD-10-CM

## 2011-03-20 DIAGNOSIS — Z7901 Long term (current) use of anticoagulants: Secondary | ICD-10-CM

## 2011-03-20 LAB — POCT INR: INR: 3.3

## 2011-04-16 ENCOUNTER — Ambulatory Visit: Payer: BC Managed Care – PPO | Admitting: Internal Medicine

## 2011-04-16 ENCOUNTER — Encounter: Payer: Self-pay | Admitting: *Deleted

## 2011-04-16 ENCOUNTER — Ambulatory Visit (INDEPENDENT_AMBULATORY_CARE_PROVIDER_SITE_OTHER): Payer: BC Managed Care – PPO | Admitting: Pharmacist

## 2011-04-16 DIAGNOSIS — Z7901 Long term (current) use of anticoagulants: Secondary | ICD-10-CM

## 2011-04-16 DIAGNOSIS — I4891 Unspecified atrial fibrillation: Secondary | ICD-10-CM

## 2011-04-16 LAB — POCT INR: INR: 3.7

## 2011-04-16 NOTE — Progress Notes (Signed)
Anti-Coagulation Progress Note  [name] is a 45 y.o. female who is currently on an anti-coagulation regimen.    RECENT RESULTS: Recent results are below, the most recent result is correlated with a dose of 30 mg. per week: Lab Results  Component Value Date   INR 3.70 04/16/2011   INR 3.3 03/20/2011   INR 2.10 02/26/2011    ANTI-COAG DOSE:   Latest dosing instructions   Total Sun Mon Tue Wed Thu Fri Sat   28 4 mg 4 mg 4 mg 4 mg 4 mg 4 mg 4 mg    (4 mg1) (4 mg1) (4 mg1) (4 mg1) (4 mg1) (4 mg1) (4 mg1)         ANTICOAG SUMMARY: Anticoagulation Episode Summary              Current INR goal 2.0-3.0 Next INR check 05/07/2011   INR from last check 3.70! (04/16/2011)     Weekly max dose (mg)  Target end date Indefinite   Indications Long-term (current) use of anticoagulants, A-fib (Resolved)   INR check location [facility] Preferred lab    Send INR reminders to [facility]TICOAG [facility]   Comments        Provider Role Specialty Phone number   Farley Ly, MD  Internal Medicine [phone]        ANTICOAG TODAY: Anticoagulation Summary as of 04/16/2011              INR goal 2.0-3.0     Selected INR 3.70! (04/16/2011) Next INR check 05/07/2011   Weekly max dose (mg)  Target end date Indefinite   Indications Long-term (current) use of anticoagulants, A-fib (Resolved)    Anticoagulation Episode Summary              INR check location [facility] Preferred lab    Send INR reminders to ANTICOAG IMP   Comments        Provider Role Specialty Phone number   Farley Ly, MD  Internal Medicine [phone]        PATIENT INSTRUCTIONS: Patient Instructions  Patient instructed to take medications as defined in the Anti-coagulation Track section of this encounter.  Patient instructed to take today's dose.  Patient verbalized understanding of these instructions.        FOLLOW-UP Return in about 3 weeks (around 05/07/2011) for Follow up INR.  Hulen Luster, III Pharm.D., CACP

## 2011-04-16 NOTE — Patient Instructions (Signed)
Patient instructed to take medications as defined in the Anti-coagulation Track section of this encounter.  Patient instructed to take today's dose.  Patient verbalized understanding of these instructions.    

## 2011-04-18 ENCOUNTER — Ambulatory Visit (INDEPENDENT_AMBULATORY_CARE_PROVIDER_SITE_OTHER): Payer: BC Managed Care – PPO | Admitting: Internal Medicine

## 2011-04-18 ENCOUNTER — Encounter: Payer: Self-pay | Admitting: Internal Medicine

## 2011-04-18 DIAGNOSIS — I4892 Unspecified atrial flutter: Secondary | ICD-10-CM

## 2011-04-18 DIAGNOSIS — I472 Ventricular tachycardia, unspecified: Secondary | ICD-10-CM

## 2011-04-18 DIAGNOSIS — I421 Obstructive hypertrophic cardiomyopathy: Secondary | ICD-10-CM

## 2011-04-18 DIAGNOSIS — Z95810 Presence of automatic (implantable) cardiac defibrillator: Secondary | ICD-10-CM

## 2011-04-18 DIAGNOSIS — I4891 Unspecified atrial fibrillation: Secondary | ICD-10-CM

## 2011-04-18 LAB — ICD DEVICE OBSERVATION
AL AMPLITUDE: 2.4 mv
AL IMPEDENCE ICD: 362.5 Ohm
BAMS-0001: 150 {beats}/min
BAMS-0003: 70 {beats}/min
DEVICE MODEL ICD: 523975
FVT: 0
HV IMPEDENCE: 61 Ohm
RV LEAD AMPLITUDE: 11.7 mv
RV LEAD IMPEDENCE ICD: 350 Ohm
TOT-0007: 2
TOT-0008: 0
TOT-0009: 1
VF: 0

## 2011-04-18 MED ORDER — FISH OIL 1200 MG PO CAPS: 1200.0000 mg | ORAL_CAPSULE | Freq: Every day | ORAL | Status: DC

## 2011-04-18 MED ORDER — DILTIAZEM HCL 180 MG PO CP24: 180.0000 mg | ORAL_CAPSULE | Freq: Every day | ORAL | Status: DC

## 2011-04-18 MED ORDER — METOPROLOL TARTRATE 50 MG PO TABS: ORAL_TABLET | ORAL | Status: DC

## 2011-04-18 NOTE — Assessment & Plan Note (Signed)
No recurrent ventricular tachycardia 

## 2011-04-18 NOTE — Patient Instructions (Signed)
Your physician wants you to follow-up in:  YEAR WITH DR KLEIN  You will receive a reminder letter in the mail two months in advance. If you don't receive a letter, please call our office to schedule the follow-up appointment. Your physician recommends that you continue on your current medications as directed. Please refer to the Current Medication list given to you today. 

## 2011-04-18 NOTE — Progress Notes (Signed)
HPI  [name] is a 45 y.o. female   Past Medical History  Diagnosis Date  . Hypertrophic obstructive cardiomyopathy (HOCM)     TEE 09/02/2007 showed moderate to severe left ventricular hypertrophy with an interventricular septal dimension of 1.5 cm and posterior wall dimension of 7 cm; there was normal LV systolic function and estimated EF of approximately 60%; there did not appear to be any obvious outflow tract obstruction.   . Atrial fibrillation     S/P radiofrequency ablation by Dr. Clydie Braun at [facility] on 02/09/2008 and again on 03/31/2009; S/P ablation at [facility] by Dr. Marchia Bond on 08/10/2010, followed by recurrent atrial flutter/fibrillation  . Atrial flutter     S/P radiofrequency ablation by Dr. Clydie Braun at [facility] on 02/09/2008 and again on 03/31/2009; S/P ablation at [facility] by Dr. Marchia Bond on 08/10/2010, followed by recurrent atrial flutter/fibrillation  . Ventricular tachycardia     Hx of nonsustained ventricular tachycardia, S/P hospitalization in March 2009; S/P ICD placement by Dr. Duke Salvia on 09/25/2007.  Marland Kitchen Anxiety     Acute stress reaction to multiple ICD shocks  . Adjustment disorder with anxiety   . Seasonal allergic rhinitis   . GERD (gastroesophageal reflux disease)   . Hyperglycemia   . Dizziness   . Anemia   . Cholecystitis, acute 07/2001    S/P laparoscopic cholecystectomy, intraoperative cholangiogram, and transcystic common bile duct exploration by Dr. Avel Peace on 07/14/2001  . Pelvic mass     Pelvic ultrasound done on 11/30/10 to evaluate abdominal pain showed,  positioned between the ovaries and contiguous with the ovaries, a complex mass demonstrating posterior acoustical enhancement and no definite intralesional flow with color Doppler exam measuring 5.2 x 3.1 x 5.5 cm. The appearance raised the possibility of a ruptured ovarian cyst with subsequent development of a hematoma.  MRI wa    . Fibroids     Past Surgical History  Procedure Date  . Cardiac defibrillator placement 09/25/2007    Placed by Dr. Duke Salvia on 09/25/2007.  . Laparoscopic cholecystectomy 07/14/2001    S/P laparoscopic cholecystectomy, intraoperative cholangiogram, and transcystic common bile duct exploration by Dr. Avel Peace on 07/14/2001    Current Outpatient Prescriptions  Medication Sig Dispense Refill  . ALPRAZolam (XANAX) 0.25 MG tablet Take 0.25 mg by mouth daily as needed.       . diltiazem (CARDIZEM CD) 180 MG 24 hr capsule Take 180 mg by mouth daily.       Marland Kitchen dofetilide (TIKOSYN) 500 MCG capsule Take 500 mcg by mouth 2 (two) times daily.        . metoprolol (TOPROL-XL) 50 MG 24 hr tablet Take 75 mg by mouth 2 (two) times daily.        . propranolol (INDERAL) 20 MG tablet Take 20 mg by mouth every 6 (six) hours as needed.        . sertraline (ZOLOFT) 50 MG tablet Take 50 mg by mouth daily.       Marland Kitchen warfarin (COUMADIN) 4 MG tablet USE AS DIRECTED BY ANTICOAGULATION CLINIC  40 tablet  2  . diltiazem (DILACOR XR) 180 MG 24 hr capsule Take 1 capsule (180 mg total) by mouth daily.      . metoprolol (LOPRESSOR) 50 MG tablet 1 1/2 TABS TWICE DAILY      . Omega-3 Fatty Acids (FISH OIL) 1200 MG CAPS Take 1 capsule (1,200 mg total) by mouth daily.  No Known Allergies  Review of Systems negative except from HPI and PMH  Physical Exam Well developed and well nourished in no acute distress HENT normal E scleral and icterus clear Neck Supple JVP flat; carotids brisk and full Clear to ausculation Regular rate and rhythm, no murmurs gallops or rub Soft with active bowel sounds No clubbing cyanosis and edema Alert and oriented, grossly normal motor and sensory function Skin Warm and Dry  ECG  Assessment and  Plan

## 2011-04-18 NOTE — Assessment & Plan Note (Signed)
The patient's device was interrogated.  The information was reviewed. No changes were made in the programming.    

## 2011-04-18 NOTE — Assessment & Plan Note (Signed)
Recurrent atrial tachycardia identified by her device. She remains on Tikosyn. We'll await input from [facility] as the next step in this regard she remains on warfarin

## 2011-04-18 NOTE — Assessment & Plan Note (Signed)
Continue current medications. 

## 2011-04-25 ENCOUNTER — Encounter: Payer: Self-pay | Admitting: Internal Medicine

## 2011-04-25 ENCOUNTER — Ambulatory Visit (INDEPENDENT_AMBULATORY_CARE_PROVIDER_SITE_OTHER): Payer: BC Managed Care – PPO | Admitting: Internal Medicine

## 2011-04-25 VITALS — BP 90/70 | HR 64 | Temp 97.8°F | Ht 66.0 in | Wt 175.8 lb

## 2011-04-25 DIAGNOSIS — Z7901 Long term (current) use of anticoagulants: Secondary | ICD-10-CM

## 2011-04-25 DIAGNOSIS — F411 Generalized anxiety disorder: Secondary | ICD-10-CM

## 2011-04-25 DIAGNOSIS — I421 Obstructive hypertrophic cardiomyopathy: Secondary | ICD-10-CM

## 2011-04-25 DIAGNOSIS — I4892 Unspecified atrial flutter: Secondary | ICD-10-CM

## 2011-04-25 LAB — CBC WITH DIFFERENTIAL/PLATELET
Basophils Absolute: 0 10*3/uL (ref 0.0–0.1)
Basophils Relative: 0 % (ref 0–1)
Eosinophils Absolute: 0.1 10*3/uL (ref 0.0–0.7)
Eosinophils Relative: 1 % (ref 0–5)
Lymphs Abs: 1.6 10*3/uL (ref 0.7–4.0)
MCH: 27.7 pg (ref 26.0–34.0)
MCHC: 31.4 g/dL (ref 30.0–36.0)
MCV: 88.1 fL (ref 78.0–100.0)
Neutrophils Relative %: 68 % (ref 43–77)
Platelets: 343 10*3/uL (ref 150–400)
RBC: 3.79 MIL/uL — ABNORMAL LOW (ref 3.87–5.11)
RDW: 14.3 % (ref 11.5–15.5)

## 2011-04-25 LAB — COMPLETE METABOLIC PANEL WITH GFR
ALT: 17 U/L (ref 0–35)
AST: 20 U/L (ref 0–37)
Albumin: 3.5 g/dL (ref 3.5–5.2)
CO2: 28 mEq/L (ref 19–32)
Calcium: 9.6 mg/dL (ref 8.4–10.5)
Chloride: 104 mEq/L (ref 96–112)
Creat: 0.82 mg/dL (ref 0.50–1.10)
GFR, Est African American: >89 mL/min/{1.73_m2}
Potassium: 4.4 mEq/L (ref 3.5–5.3)

## 2011-04-25 LAB — MAGNESIUM: Magnesium: 1.9 mg/dL (ref 1.5–2.5)

## 2011-04-25 NOTE — Assessment & Plan Note (Signed)
See comments regarding atrial tachycardia.  Will check blood work today.

## 2011-04-25 NOTE — Progress Notes (Signed)
  Subjective:    Patient ID: [name], female    DOB: 1966/06/01, 45 y.o.   MRN: [id number]  HPI Patient returns for followup of her atrial tachycardia, hypertropic cardiomyopathy, and other chronic medical problems.  She underwent Convergent Procedure at [facility] on 03/07/2011, and she reports that she has had some episodes of atrial tachycardia since then at much lower rates than her previous atrial flutter rates.  Overall she has been doing well, and she has followup scheduled next week at [facility].  Review of Systems  Respiratory: Negative for shortness of breath.   Cardiovascular: Negative for chest pain and leg swelling.  Gastrointestinal: Negative for nausea, vomiting and abdominal pain.  Neurological: Negative for dizziness and syncope.       Objective:   Physical Exam  Constitutional: No distress.  Cardiovascular: Normal rate and regular rhythm.  Exam reveals no S3 and no S4.   Murmur heard.  Systolic murmur is present with a grade of 2/6       No lower extremity edema  Pulmonary/Chest: Effort normal and breath sounds normal. No respiratory distress. She has no wheezes. She has no rales.  Abdominal: Soft. Bowel sounds are normal. She exhibits no distension. There is no tenderness. There is no guarding.  Musculoskeletal: She exhibits no edema.       Assessment & Plan:

## 2011-04-25 NOTE — Assessment & Plan Note (Signed)
Patient reports doing well following her convergent procedure done at [facility] on 03/07/2011.  She reports episodes of tachycardia, but the rates are much lower than her previous episodes of tachycardia or atrial flutter.  Her systolic blood pressures have been lower than her usual baseline, but she denies dizziness, presyncope, or syncope.  Her metoprolol dose was reduced by her physician at [facility] because of her lower systolic pressure, and he is aware of her current status.  She will followup there as scheduled next week.

## 2011-04-25 NOTE — Assessment & Plan Note (Signed)
INR  Date Value Range Status  04/25/2011 3.7   Final    INR today is 3.7 on a warfarin dose of 4 mg daily (28 mg per week).  She has no symptoms to suggest bleeding complications, and she reports that she has been compliant with her dosing instructions.  I spoke by telephone with Dr. Alexandria Lodge, and the plan is to decrease her weekly dose to 24 mg per week.  She will take this as follows starting today : take 2 mg on Wednesday and Sunday; take 4 mg on Thursday, Friday, Saturday, Monday, and Tuesday.  She will continue this until she sees Dr. Alexandria Lodge back for her scheduled appointment on November 26.

## 2011-04-25 NOTE — Patient Instructions (Signed)
Change warfarin dose to the following:  On Wednesdays and Sundays, take 1/2 of a 4 mg tablet On all other weekdays, take one 4 mg tablet.  Continue this dose until you see Dr. Alexandria Lodge on 11/26 in the anticoagulation clinic.

## 2011-04-25 NOTE — Assessment & Plan Note (Signed)
Patient is doing well on current regimen.

## 2011-04-27 ENCOUNTER — Telehealth: Payer: Self-pay | Admitting: Internal Medicine

## 2011-04-27 DIAGNOSIS — D649 Anemia, unspecified: Secondary | ICD-10-CM | POA: Insufficient documentation

## 2011-04-27 NOTE — Assessment & Plan Note (Signed)
Patient's hemoglobin on November 14 was 10.5 compared to a prior value of 12.3.  I called her at home this evening and spoke with her about this.  She reports that she is doing well without problems; she has had no vaginal bleeding or signs of GI bleeding, specifically no melena or bright red blood per rectum.  She denies abdominal pain, back pain, or other problems.  I advised her to return next week for a repeat CBC and to pick up cards to check for fecal occult blood.  She is unable to come in Monday, but indicated that she would come in on Tuesday for this.  I will be away next week and I informed her of this; the plan will be to have the results of her CBC given to the clinic attending.  I advised patient to come in immediately she has any signs or symptoms of bleeding, or if she develops abdominal pain, back pain, or other new problems.

## 2011-04-27 NOTE — Telephone Encounter (Signed)
Patient's hemoglobin on November 14 was 10.5 compared to a prior value of 12.3.  I called her at home this evening and spoke with her about this.  She reports that she is doing well without problems; she has had no vaginal bleeding or signs of GI bleeding, specifically no melena or bright red blood per rectum.  She denies abdominal pain, back pain, or other problems.  I advised her to return next week for a repeat CBC and to pick up cards to check for fecal occult blood.  She is unable to come in Monday, but indicated that she would come in on Tuesday for this.  I will be away next week and I informed her of this; the plan will be to have the results of her CBC given to the clinic attending.  I advised patient to come in immediately she has any signs or symptoms of bleeding, or if she develops abdominal pain, back pain, or other new problems. 

## 2011-05-01 ENCOUNTER — Other Ambulatory Visit: Payer: Self-pay | Admitting: Internal Medicine

## 2011-05-01 ENCOUNTER — Other Ambulatory Visit (INDEPENDENT_AMBULATORY_CARE_PROVIDER_SITE_OTHER): Payer: BC Managed Care – PPO

## 2011-05-01 DIAGNOSIS — D649 Anemia, unspecified: Secondary | ICD-10-CM

## 2011-05-01 LAB — CBC WITH DIFFERENTIAL/PLATELET
Basophils Relative: 0 % (ref 0–1)
Eosinophils Absolute: 0.1 10*3/uL (ref 0.0–0.7)
Eosinophils Relative: 1 % (ref 0–5)
Lymphs Abs: 1.2 10*3/uL (ref 0.7–4.0)
MCH: 27.7 pg (ref 26.0–34.0)
MCHC: 31.7 g/dL (ref 30.0–36.0)
MCV: 87.4 fL (ref 78.0–100.0)
Neutrophils Relative %: 78 % — ABNORMAL HIGH (ref 43–77)
Platelets: 302 10*3/uL (ref 150–400)
RBC: 3.5 MIL/uL — ABNORMAL LOW (ref 3.87–5.11)
RDW: 14.3 % (ref 11.5–15.5)

## 2011-05-01 LAB — FERRITIN: Ferritin: 37 ng/mL (ref 10–291)

## 2011-05-01 LAB — RETICULOCYTES: RBC.: 3.5 MIL/uL — ABNORMAL LOW (ref 3.87–5.11)

## 2011-05-01 MED ORDER — ESOMEPRAZOLE MAGNESIUM 20 MG PO CPDR: 20.0000 mg | DELAYED_RELEASE_CAPSULE | Freq: Every day | ORAL | Status: DC

## 2011-05-01 NOTE — Telephone Encounter (Signed)
Pt called and stated she will come in today for labs.

## 2011-05-01 NOTE — Telephone Encounter (Signed)
Pt was called; no answer; message left on answering machine to call the clinic for lab appt.

## 2011-05-01 NOTE — Telephone Encounter (Signed)
Please schedule for lab only appt this week.

## 2011-05-01 NOTE — Progress Notes (Signed)
Spoke with [name] regarding worsening Hb. Correlates with ablation in September.Slightly high INR corrected. Was on PPI for 30 days following ablation. I am most concerned about a GI source for bleeding, slow occult bleed. Hb now 9.7 trending down, asymptomatic. No abdominal pain. Some epigastric discomfort right after her ablation only. Ablation was Sept 26,2012. Gyn issues stable, no bleeding. Will place referral to GI for endoscopy and possible colonoscopy, pt sent with stool cards for hemmocult. Also started on PPI.

## 2011-05-07 ENCOUNTER — Other Ambulatory Visit (INDEPENDENT_AMBULATORY_CARE_PROVIDER_SITE_OTHER): Payer: BC Managed Care – PPO

## 2011-05-07 ENCOUNTER — Ambulatory Visit (INDEPENDENT_AMBULATORY_CARE_PROVIDER_SITE_OTHER): Payer: BC Managed Care – PPO | Admitting: Pharmacist

## 2011-05-07 DIAGNOSIS — Z7901 Long term (current) use of anticoagulants: Secondary | ICD-10-CM

## 2011-05-07 DIAGNOSIS — I4891 Unspecified atrial fibrillation: Secondary | ICD-10-CM

## 2011-05-07 DIAGNOSIS — D649 Anemia, unspecified: Secondary | ICD-10-CM

## 2011-05-07 LAB — POC HEMOCCULT BLD/STL (HOME/3-CARD/SCREEN)
Card #2 Fecal Occult Blod, POC: NEGATIVE
Fecal Occult Blood, POC: NEGATIVE

## 2011-05-07 LAB — POCT INR: INR: 2.3

## 2011-05-07 NOTE — Progress Notes (Signed)
Anti-Coagulation Progress Note  [name] is a 45 y.o. female who is currently on an anti-coagulation regimen.    RECENT RESULTS: Recent results are below, the most recent result is correlated with a dose of 24 mg. per week: Lab Results  Component Value Date   INR 2.3 05/07/2011   INR 3.7 04/25/2011   INR 3.70 04/16/2011    ANTI-COAG DOSE:   Latest dosing instructions   Total Sun Mon Tue Wed Thu Fri Sat   24 2 mg 4 mg 4 mg 2 mg 4 mg 4 mg 4 mg    (4 mg0.5) (4 mg1) (4 mg1) (4 mg0.5) (4 mg1) (4 mg1) (4 mg1)         ANTICOAG SUMMARY: Anticoagulation Episode Summary              Current INR goal 2.0-3.0 Next INR check 05/28/2011   INR from last check 2.3 (05/07/2011)     Weekly max dose (mg)  Target end date Indefinite   Indications Long-term (current) use of anticoagulants, A-fib (Resolved)   INR check location [facility] Preferred lab    Send INR reminders to ANTICOAG [facility]   Comments        Provider Role Specialty Phone number   Farley Ly, MD  Internal Medicine [phone]        ANTICOAG TODAY: Anticoagulation Summary as of 05/07/2011              INR goal 2.0-3.0     Selected INR 2.3 (05/07/2011) Next INR check 05/28/2011   Weekly max dose (mg)  Target end date Indefinite   Indications Long-term (current) use of anticoagulants, A-fib (Resolved)    Anticoagulation Episode Summary              INR check location [facility] Preferred lab    Send INR reminders to ANTICOAG [facility]   Comments        Provider Role Specialty Phone number   Farley Ly, MD  Internal Medicine [phone]        PATIENT INSTRUCTIONS: Patient Instructions  Patient instructed to take medications as defined in the Anti-coagulation Track section of this encounter.  Patient instructed to take today's dose.  Patient verbalized understanding of these instructions.        FOLLOW-UP No Follow-up on file.  Hulen Luster, III Pharm.D., CACP

## 2011-05-07 NOTE — Patient Instructions (Signed)
Patient instructed to take medications as defined in the Anti-coagulation Track section of this encounter.  Patient instructed to take today's dose.  Patient verbalized understanding of these instructions.    

## 2011-05-11 ENCOUNTER — Other Ambulatory Visit (INDEPENDENT_AMBULATORY_CARE_PROVIDER_SITE_OTHER): Payer: BC Managed Care – PPO

## 2011-05-11 ENCOUNTER — Other Ambulatory Visit: Payer: Self-pay | Admitting: Internal Medicine

## 2011-05-11 DIAGNOSIS — D649 Anemia, unspecified: Secondary | ICD-10-CM

## 2011-05-11 DIAGNOSIS — I4891 Unspecified atrial fibrillation: Secondary | ICD-10-CM

## 2011-05-11 DIAGNOSIS — Z7901 Long term (current) use of anticoagulants: Secondary | ICD-10-CM

## 2011-05-11 LAB — CBC
Hemoglobin: 10.4 g/dL — ABNORMAL LOW (ref 12.0–15.0)
MCH: 27.4 pg (ref 26.0–34.0)
MCHC: 31.1 g/dL (ref 30.0–36.0)
MCV: 87.9 fL (ref 78.0–100.0)
Platelets: 373 10*3/uL (ref 150–400)
RBC: 3.8 MIL/uL — ABNORMAL LOW (ref 3.87–5.11)

## 2011-05-17 ENCOUNTER — Telehealth: Payer: Self-pay | Admitting: Internal Medicine

## 2011-05-17 NOTE — Telephone Encounter (Signed)
Patient is scheduled for colonoscopy/endoscopy  on 12/11 by Dr. Evette Cristal at [facility], and his office requested recommendations for management of her anticoagulation around the procedure.  I discussed with Chancy Milroy and consensus is that bridging with Lovenox is appropriate; he provided a protocol for this, and we called patient, discussed this with her, and Dr. Alexandria Lodge provided a written copy of the protocol along with Lovenox 80 mg syringes for patient to pick up in clinic.   Plan is as follows: 1. Patient will stop warfarin today 12/6. 2. On Sunday 12/9 she will take Lovenox 80 mg at 8 AM and 8 PM. 3. On Monday 12/10 she will take Lovenox 80 mg at 8 AM, and then stop Lovenox. 4. On Wednesday 12/12, 24 hours after the procedure, she will restart Lovenox 80 mg every 12 hours and will restart warfarin at her usual dose and continue through Sunday evening 12/16. 5. On Monday 12/17 she will come in for an INR check by Dr. Alexandria Lodge.  A copy of this note and the protocol will be faxed to Dr. Evette Cristal at [facility].

## 2011-05-22 ENCOUNTER — Other Ambulatory Visit: Payer: Self-pay | Admitting: Gastroenterology

## 2011-05-22 DIAGNOSIS — D128 Benign neoplasm of rectum: Secondary | ICD-10-CM

## 2011-05-22 HISTORY — DX: Benign neoplasm of rectum: D12.8

## 2011-05-25 ENCOUNTER — Ambulatory Visit (INDEPENDENT_AMBULATORY_CARE_PROVIDER_SITE_OTHER): Payer: BC Managed Care – PPO | Admitting: Internal Medicine

## 2011-05-25 ENCOUNTER — Telehealth: Payer: Self-pay | Admitting: Internal Medicine

## 2011-05-25 ENCOUNTER — Encounter (HOSPITAL_COMMUNITY): Payer: Self-pay | Admitting: Internal Medicine

## 2011-05-25 ENCOUNTER — Telehealth: Payer: Self-pay | Admitting: Pharmacist

## 2011-05-25 ENCOUNTER — Encounter: Payer: Self-pay | Admitting: Internal Medicine

## 2011-05-25 ENCOUNTER — Inpatient Hospital Stay (HOSPITAL_COMMUNITY)
Admission: AD | Admit: 2011-05-25 | Discharge: 2011-05-28 | DRG: 453 | Disposition: A | Discharge status: Home / Self Care | Payer: BC Managed Care – PPO | Source: Ambulatory Visit | Attending: Internal Medicine | Admitting: Internal Medicine

## 2011-05-25 DIAGNOSIS — Y849 Medical procedure, unspecified as the cause of abnormal reaction of the patient, or of later complication, without mention of misadventure at the time of the procedure: Secondary | ICD-10-CM | POA: Diagnosis present

## 2011-05-25 DIAGNOSIS — IMO0002 Reserved for concepts with insufficient information to code with codable children: Principal | ICD-10-CM | POA: Diagnosis present

## 2011-05-25 DIAGNOSIS — K922 Gastrointestinal hemorrhage, unspecified: Secondary | ICD-10-CM

## 2011-05-25 DIAGNOSIS — I4891 Unspecified atrial fibrillation: Secondary | ICD-10-CM | POA: Diagnosis present

## 2011-05-25 DIAGNOSIS — I4892 Unspecified atrial flutter: Secondary | ICD-10-CM

## 2011-05-25 DIAGNOSIS — K648 Other hemorrhoids: Secondary | ICD-10-CM | POA: Diagnosis present

## 2011-05-25 DIAGNOSIS — Z7901 Long term (current) use of anticoagulants: Secondary | ICD-10-CM

## 2011-05-25 DIAGNOSIS — K219 Gastro-esophageal reflux disease without esophagitis: Secondary | ICD-10-CM | POA: Diagnosis present

## 2011-05-25 DIAGNOSIS — Z95810 Presence of automatic (implantable) cardiac defibrillator: Secondary | ICD-10-CM

## 2011-05-25 DIAGNOSIS — D62 Acute posthemorrhagic anemia: Secondary | ICD-10-CM | POA: Diagnosis present

## 2011-05-25 DIAGNOSIS — K921 Melena: Secondary | ICD-10-CM

## 2011-05-25 DIAGNOSIS — D649 Anemia, unspecified: Secondary | ICD-10-CM

## 2011-05-25 DIAGNOSIS — F419 Anxiety disorder, unspecified: Secondary | ICD-10-CM | POA: Diagnosis present

## 2011-05-25 DIAGNOSIS — I498 Other specified cardiac arrhythmias: Secondary | ICD-10-CM | POA: Diagnosis not present

## 2011-05-25 DIAGNOSIS — F411 Generalized anxiety disorder: Secondary | ICD-10-CM | POA: Diagnosis present

## 2011-05-25 HISTORY — DX: Gastrointestinal hemorrhage, unspecified: K92.2

## 2011-05-25 LAB — ABO/RH: ABO/RH(D): A POS

## 2011-05-25 LAB — CBC
HCT: 33.1 % — ABNORMAL LOW (ref 36.0–46.0)
Hemoglobin: 10 g/dL — ABNORMAL LOW (ref 12.0–15.0)
MCH: 26.5 pg (ref 26.0–34.0)
MCH: 26.8 pg (ref 26.0–34.0)
MCHC: 30.2 g/dL (ref 30.0–36.0)
MCHC: 31.2 g/dL (ref 30.0–36.0)
Platelets: 290 10*3/uL (ref 150–400)
RDW: 14.2 % (ref 11.5–15.5)

## 2011-05-25 LAB — COMPREHENSIVE METABOLIC PANEL
Albumin: 3.6 g/dL (ref 3.5–5.2)
BUN: 10 mg/dL (ref 6–23)
Calcium: 9.8 mg/dL (ref 8.4–10.5)
Creatinine, Ser: 0.76 mg/dL (ref 0.50–1.10)
Total Bilirubin: 0.4 mg/dL (ref 0.3–1.2)
Total Protein: 6.9 g/dL (ref 6.0–8.3)

## 2011-05-25 LAB — DIFFERENTIAL
Basophils Relative: 0 % (ref 0–1)
Eosinophils Absolute: 0.1 10*3/uL (ref 0.0–0.7)
Eosinophils Relative: 1 % (ref 0–5)
Monocytes Absolute: 0.6 10*3/uL (ref 0.1–1.0)
Monocytes Relative: 8 % (ref 3–12)

## 2011-05-25 LAB — HEPARIN ANTI-XA: Heparin LMW: 0.66 IU/mL

## 2011-05-25 MED ORDER — SERTRALINE HCL 50 MG PO TABS
50.0000 mg | ORAL_TABLET | Freq: Every day | ORAL | Status: DC
  Administered 2011-05-26 – 2011-05-28 (×3): 50 mg via ORAL
  Filled 2011-05-25 (×3): qty 1

## 2011-05-25 MED ORDER — SODIUM CHLORIDE 0.9 % IV SOLN
INTRAVENOUS | Status: AC
  Administered 2011-05-25: 18:00:00 via INTRAVENOUS

## 2011-05-25 MED ORDER — DILTIAZEM HCL ER COATED BEADS 180 MG PO CP24
180.0000 mg | ORAL_CAPSULE | Freq: Every day | ORAL | Status: DC
  Administered 2011-05-25: 180 mg via ORAL
  Filled 2011-05-25 (×2): qty 1

## 2011-05-25 MED ORDER — ALPRAZOLAM 0.25 MG PO TABS: 0.2500 mg | ORAL_TABLET | Freq: Every day | ORAL | Status: DC | PRN

## 2011-05-25 MED ORDER — METOPROLOL SUCCINATE ER 50 MG PO TB24
50.0000 mg | ORAL_TABLET | Freq: Two times a day (BID) | ORAL | Status: DC
  Administered 2011-05-25 – 2011-05-28 (×6): 50 mg via ORAL
  Filled 2011-05-25 (×8): qty 1

## 2011-05-25 MED ORDER — PROPRANOLOL HCL 20 MG PO TABS
20.0000 mg | ORAL_TABLET | Freq: Four times a day (QID) | ORAL | Status: DC | PRN
  Filled 2011-05-25 (×2): qty 1

## 2011-05-25 MED ORDER — FLEET ENEMA 7-19 GM/118ML RE ENEM
1.0000 | ENEMA | Freq: Once | RECTAL | Status: AC
  Administered 2011-05-26: 1 via RECTAL
  Filled 2011-05-25: qty 1

## 2011-05-25 MED ORDER — PANTOPRAZOLE SODIUM 40 MG PO TBEC
40.0000 mg | DELAYED_RELEASE_TABLET | Freq: Every day | ORAL | Status: DC
  Administered 2011-05-25 – 2011-05-28 (×4): 40 mg via ORAL
  Filled 2011-05-25 (×4): qty 1

## 2011-05-25 MED ORDER — DOFETILIDE 500 MCG PO CAPS
500.0000 ug | ORAL_CAPSULE | Freq: Two times a day (BID) | ORAL | Status: DC
  Administered 2011-05-25 – 2011-05-28 (×6): 500 ug via ORAL
  Filled 2011-05-25 (×8): qty 1

## 2011-05-25 NOTE — Assessment & Plan Note (Signed)
Patient has a history of recent poly removal on 05/22/11 and is on heparin and warfarin since 05/23/11, given this acute hematochezia, the likely source of bleed is this polyp. Will plan to order stat labs to include cbc, bmet, inr, heparin level, and admit the patient to tele for observation overnight to monitor for persistent bleed. Will hold anticoagulation for now. And call Dr. Beverely Risen from GI if she has further episodes of GI bleed.

## 2011-05-25 NOTE — Progress Notes (Signed)
Subjective:     Patient ID: [name], female   DOB: 05/04/1966, 45 y.o.   MRN: [id number]  HPI  Patient is a 45 year old female with a PMH listed below presents to the clinic for an acute visit after seeing blood with her stool starting this morning. Note that the patient is on warfarin for afib, this was recently stopped due to colonoscopy and endoscopy, during which a polyp was removed by Dr. Ganam on 05/22/11, she started bridging with heparin and resumed warfarin from 05/23/11, she was tolerating this well, until this morning when she was on the toilet, she noticed blood in her stool.    Patient Active Problem List  Diagnoses  . UNSPECIFIED ANEMIA  . ANXIETY  . HYPERTROPHIC OBSTRUCTIVE CARDIOMYOPATHY  . VENTRICULAR TACHYCARDIA  . Atrial fibrillation  . Atrial Tachycardia  . ALLERGIC RHINITIS, SEASONAL  . GERD  . HYPERGLYCEMIA  . AUTOMATIC IMPLANTABLE CARDIAC DEFIBRILLATOR SITU  . Long-term (current) use of anticoagulants  . Pelvic mass  . Anemia  . Hematochezia   Current Outpatient Prescriptions on File Prior to Visit  Medication Sig Dispense Refill  . ALPRAZolam (XANAX) 0.25 MG tablet Take 0.25 mg by mouth daily as needed.       . diltiazem (CARDIZEM CD) 180 MG 24 hr capsule Take 180 mg by mouth daily.       . dofetilide (TIKOSYN) 500 MCG capsule Take 500 mcg by mouth 2 (two) times daily.        . esomeprazole (NEXIUM) 20 MG capsule Take 1 capsule (20 mg total) by mouth daily.  30 capsule  0  . metoprolol (TOPROL-XL) 50 MG 24 hr tablet Take 50 mg by mouth 2 (two) times daily.       . Omega-3 Fatty Acids (FISH OIL) 1200 MG CAPS Take 1 capsule (1,200 mg total) by mouth daily.      . propranolol (INDERAL) 20 MG tablet Take 20 mg by mouth every 6 (six) hours as needed.        . sertraline (ZOLOFT) 50 MG tablet Take 50 mg by mouth daily.       . warfarin (COUMADIN) 4 MG tablet USE AS DIRECTED BY ANTICOAGULATION CLINIC  40 tablet  2  .No Known Allergies   Review of Systems    Gastrointestinal: Positive for abdominal pain, blood in stool, anal bleeding and rectal pain. Negative for nausea, vomiting, diarrhea, constipation and abdominal distention.       Objective:   Physical Exam  Vitals reviewed. Constitutional: She is oriented to person, place, and time. She appears well-developed and well-nourished.  HENT:  Head: Normocephalic and atraumatic.  Eyes: Pupils are equal, round, and reactive to light.  Neck: Normal range of motion. Neck supple. No JVD present. No thyromegaly present.  Cardiovascular: Normal rate, regular rhythm and normal heart sounds.   No murmur heard. Pulmonary/Chest: Effort normal and breath sounds normal. She has no wheezes. She has no rales.  Abdominal: Soft. Bowel sounds are normal. She exhibits no distension and no mass. There is tenderness. There is no rebound and no guarding.  Musculoskeletal: Normal range of motion. She exhibits no edema.  Neurological: She is alert and oriented to person, place, and time.  Skin: Skin is warm and dry.     

## 2011-05-25 NOTE — Patient Instructions (Signed)
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
## 2011-05-25 NOTE — Progress Notes (Signed)
Pt admitted and oriented to the unit. Pt assessed, does not appear in any immediate distress. VSS. Pt placed on tele. Will continue to monitor.
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
## 2011-05-25 NOTE — Telephone Encounter (Signed)
Patient called citing she was having BRBPR--when she passed gas. Has occurred 4 to 5 times she states. Had called at that time to GI Medicine but was awaiting their return call. l called Dr. Meredith Pel to discuss. He felt she should come to [facility] ASAP unless she was having heavy bleeding--at which time the suggestion would have been to go to the ED. She denied continuous bleeding per rectum. Dr. Meredith Pel was going to call her. She has presented to the [facility] and her POC INR is 1.2. She has been advised at present to continue to hold her warfarin/Lovenox.

## 2011-05-25 NOTE — Telephone Encounter (Signed)
Patient called clinic this AM and reported that she had passed blood clots and a small amount of bright red blood per rectum this AM, associated with abdominal cramping.  She underwent colonoscopy and EGD by Dr. Evette Cristal on 12/11, and on 12/12 she restarted warfarin and aso started Lovenox as a bridge.  She reportedly had a polyp removed at colonoscopy.  She denies any dizziness, and is not currently having pain or passing blood.  She is likely bleeding from the biopsy site.  She has someone to drive her to clinic, and I advised her to come in immediately.  She will almost certainly need admission to hospital.  She will be seen by resident Dr.Tobbia, and I discussed her with him.

## 2011-05-25 NOTE — Progress Notes (Signed)
Attempts to start Saline lock no available sites.  Call to IV Therapy who will see pt on the floor.  Report called to Nurse on 4700.  Pt was transferred via wheelchair to [id number].  Angelina Ok, RN 05/25/2011 2:57 PM

## 2011-05-26 ENCOUNTER — Encounter (HOSPITAL_COMMUNITY)
Admission: AD | Disposition: A | Discharge status: Home / Self Care | Payer: Self-pay | Source: Ambulatory Visit | Attending: Internal Medicine

## 2011-05-26 ENCOUNTER — Encounter (HOSPITAL_COMMUNITY): Payer: Self-pay | Admitting: Gastroenterology

## 2011-05-26 HISTORY — PX: FLEXIBLE SIGMOIDOSCOPY: SHX5431

## 2011-05-26 LAB — CBC
MCHC: 31.3 g/dL (ref 30.0–36.0)
MCHC: 30.7 g/dL (ref 30.0–36.0)
Platelets: 248 10*3/uL (ref 150–400)
Platelets: 240 10*3/uL (ref 150–400)
Platelets: 268 10*3/uL (ref 150–400)
RDW: 14.2 % (ref 11.5–15.5)
RDW: 14.3 % (ref 11.5–15.5)
RDW: 14.3 % (ref 11.5–15.5)
WBC: 6 10*3/uL (ref 4.0–10.5)
WBC: 5.2 10*3/uL (ref 4.0–10.5)
WBC: 7 10*3/uL (ref 4.0–10.5)

## 2011-05-26 SURGERY — SIGMOIDOSCOPY, FLEXIBLE
Anesthesia: Moderate Sedation

## 2011-05-26 MED ORDER — MIDAZOLAM HCL 10 MG/2ML IJ SOLN
INTRAMUSCULAR | Status: DC | PRN
  Administered 2011-05-26 (×3): 2 mg via INTRAVENOUS

## 2011-05-26 MED ORDER — FENTANYL CITRATE 0.05 MG/ML IJ SOLN
INTRAMUSCULAR | Status: AC
  Filled 2011-05-26: qty 2

## 2011-05-26 MED ORDER — EPINEPHRINE HCL 0.1 MG/ML IJ SOLN
INTRAMUSCULAR | Status: AC
  Filled 2011-05-26: qty 10

## 2011-05-26 MED ORDER — MIDAZOLAM HCL 10 MG/2ML IJ SOLN
INTRAMUSCULAR | Status: AC
  Filled 2011-05-26: qty 2

## 2011-05-26 MED ORDER — FENTANYL CITRATE 0.05 MG/ML IJ SOLN
INTRAMUSCULAR | Status: DC | PRN
  Administered 2011-05-26 (×4): 25 ug via INTRAVENOUS

## 2011-05-26 MED ORDER — SODIUM CHLORIDE 0.9 % IV SOLN
Freq: Once | INTRAVENOUS | Status: DC
Start: 2011-05-26 — End: 2011-05-28

## 2011-05-26 MED ORDER — SODIUM CHLORIDE 0.9 % IJ SOLN
INTRAMUSCULAR | Status: DC | PRN
  Administered 2011-05-26: 09:00:00

## 2011-05-26 MED ORDER — DILTIAZEM HCL ER COATED BEADS 180 MG PO CP24
180.0000 mg | ORAL_CAPSULE | Freq: Every day | ORAL | Status: DC
  Administered 2011-05-26 – 2011-05-27 (×2): 180 mg via ORAL
  Filled 2011-05-26 (×3): qty 1

## 2011-05-26 NOTE — Interval H&P Note (Signed)
History and Physical Interval Note:  05/26/2011 8:50 AM  [name]  has presented today for surgery, with the diagnosis of post-polypectomy bleed  The various methods of treatment have been discussed with the patient and family. After consideration of risks, benefits and other options for treatment, the patient has consented to  Procedure(s): FLEXIBLE SIGMOIDOSCOPY as a surgical intervention .  The patients' history has been reviewed, patient examined, no change in status, stable for surgery.  I have reviewed the patients' chart and labs.  Questions were answered to the patient's satisfaction.     Kijana Cromie C.

## 2011-05-26 NOTE — Progress Notes (Signed)
Subjective:    There were no events overnight. Currently, the patient confirms symptoms of some small amounts of blood in toilet bowl prior to the procedure this morning. Had her flex sig this AM without notable complication. No stools since. She denies symptoms of chest pressure/discomfort, dyspnea and palpitations, chills, fevers, abdominal pain, nausea, vomiting.    Objective:    Vital Signs:   Temp:  [98.2 F (36.8 C)-99.5 F (37.5 C)] 99.5 F (37.5 C) (12/15 0842) Pulse Rate:  [59-66] 61  (12/15 0842) Resp:  [12-43] 19  (12/15 1010) BP: (88-120)/(50-75) 120/75 mmHg (12/15 1010) SpO2:  [91 %-100 %] 91 % (12/15 1010) Weight:  [169 lb 3.2 oz (76.749 kg)-170 lb 13.7 oz (77.5 kg)] 169 lb 3.2 oz (76.749 kg) (12/15 0500) Last BM Date: 05/26/11   Intake/Output:   Intake/Output Summary (Last 24 hours) at 05/26/11 1022 Last data filed at 05/25/11 1855  Gross per 24 hour  Intake 104.17 ml  Output      0 ml  Net 104.17 ml      Physical Exam: General: Vital signs reviewed and noted. Well-developed, well-nourished, in no acute distress; alert, appropriate and cooperative throughout examination.  Lungs:  Normal respiratory effort. Clear to auscultation BL without crackles or wheezes.  Heart: Irregular rhythm, regular rate S1 and S2 normal without gallop, murmur, or rubs.  Abdomen:  BS normoactive. Soft, Nondistended, non-tender.  No masses or organomegaly.  Extremities: No pretibial edema.     Labs:  Basic Metabolic Panel:  Lab 05/25/11 8295  NA 142  K 4.1  CL 105  CO2 27  GLUCOSE 93  BUN 10  CREATININE 0.76  CALCIUM 9.8  MG --  PHOS --    Liver Function Tests:  Lab 05/25/11 1322  AST 16  ALT 10  ALKPHOS 104  BILITOT 0.4  PROT 6.9  ALBUMIN 3.6    CBC:  Lab 05/26/11 0600 05/26/11 0035 05/25/11 1645 05/25/11 1322  WBC 5.2 6.0 7.3 6.9  NEUTROABS -- -- -- 5.3  HGB 8.6* 9.0* 9.7* 10.0*  HCT 27.5* 29.0* 31.1* 33.1*  MCV 85.9 86.3 85.9 87.6  PLT 240 248 290  287    Microbiology: Results for orders placed during the hospital encounter of 05/19/10  MRSA PCR SCREENING     Status: Normal   Collection Time   05/20/10  1:18 AM      Component Value Range Status Comment   MRSA by PCR    NEGATIVE  Final    Value: NEGATIVE            The GeneXpert MRSA Assay (FDA     approved for NASAL specimens     only), is one component of a     comprehensive MRSA colonization     surveillance program. It is not     intended to diagnose MRSA     infection nor to guide or     monitor treatment for     MRSA infections.    Coagulation Studies:  Basename 05/25/11 1326  LABPROT --  INR 1.2    Other results:  Flexible sigmoidoscopy (12/15/21012) - Postpolypectomy bleed - s/p epinephrine:saline mixture injection (8cc total and hemoclip placement 2. External and internal hemorrhoids.      Medications:    Infusions:    . sodium chloride 125 mL/hr at 05/25/11 1855    Scheduled Medications:    . diltiazem  180 mg Oral Daily  . dofetilide  500 mcg Oral Q12H  .  metoprolol  50 mg Oral BID  . pantoprazole  40 mg Oral Daily  . sertraline  50 mg Oral Daily  . sodium phosphate  1 enema Rectal Once    PRN Medications: ALPRAZolam, EPINEPHrine 1:10,000, 10 mL syringe/NS, 10 mL vial for sclerotherapy inj mixture, fentaNYL, midazolam, propranolol   Assessment/ Plan:    Pt is a 45 y.o. yo female with a PMHx of atrial fibrillation on chronic coumadin therapy, atrial tachycardias (s/p multiple ablations), ventricular tachycardias s/p defibrillator, recent colonoscopy on 05/22/2011 s/p polyepectomy who was admitted on 05/25/2011 with symptoms of BRBPR. She had Flexible sigmoidoscopy performed on 05/26/2011 and is now s/p epi injection and hemoclip placement.   1. GI bleed - 2/2 to post-polypectomy (confirmed on flex sig 05/26/2011) bleed in the setting of chronic anticoagulation. Pt also notably has internal and external hemorrhoids. Pt is now s/p epi  injection and hemoclip placement.    Appreciate GI input in the care of this patient. Per GI recommendation, continue clear liquids today and advance diet slowly 12/16.   2. Atrial fibrillation - Has been rate controlled overnight. On chronic coumadin therapy at home, as well on multiple antiarrhythmics that have been continued during the hospital course. If continues to have the complex supra-ventricular rhythm, will consider consult cardiology - likely [place] given her EP doctor is Dr. Graciela Husbands.  Hold coumadin until 12/17 per GI recommendations.  Continue current antiarrhythmics  Continue monitoring.  Check AM EKG.   3. Anxiety - stable at present.  Continue home regimen of Zoloft and Xanax BID PRN anxiety  4. Acute blood loss anemia in setting of chronic baseline anemia - acutely worsened by GI blood loss as mentioned above. Prior Hgb baseline of 12 until 12/2010, and has since had declined to 9-10. Ferritin in 04/2011 of 37, Folate normal, B12 normal.  Continue to monitor q12h H/H, consider to transfuse if < 7 or if significantly symptomatic.  5. GERD - stable. Continue PPI.  6. History of atrial and ventricular tachycarrhymias - s/p defibrillator placement   Continue to monitor on telemetry.   Continue antiarrhythmics.  Check EKG.   Length of Stay: 1   Johnette Abraham, D.OGlenna Durand, Internal Medicine Resident 05/26/2011, 10:22 AM

## 2011-05-26 NOTE — Brief Op Note (Addendum)
See endopro note for complete details of sigmoidoscopy. S/P hemoclip placement and epinephrine injection of polypectomy site. Clot noted at site without active bleeding.  Clears ok today then advance tomorrow. Hold off on all anticoagulation until Monday and then resume if stable.

## 2011-05-26 NOTE — Op Note (Signed)
[facility] [address] [place], [place]  [address]  FLEXIBLE SIGMOIDOSCOPY PROCEDURE REPORT  PATIENT:  [name], [name]  MR#:  [id number] BIRTHDATE:  02/17/1966, 45 yrs. old  GENDER:  female  ENDOSCOPIST:  Charlott Rakes, MD Referred by:  PROCEDURE DATE:  05/26/2011 PROCEDURE:  Flex Sig w/ endoclips placement, Flexible Sigmoidoscopy for control of bleeding ASA CLASS:  Class III INDICATIONS:  bright red bleeding, hematochezia  MEDICATIONS:   Fentanyl 100 mcg IV, Versed 6 mg IV  DESCRIPTION OF PROCEDURE:   After the risks benefits and alternatives of the procedure were thoroughly explained, informed consent was obtained.  Digital rectal exam was performed and revealed external hemorrhoids.   The pentax egd [id number] and [id number] endoscope was introduced through the anus and advanced to the sigmoid colon, without limitations.  The quality of the prep was good.  The instrument was then slowly withdrawn as the mucosa was fully examined. <<PROCEDUREIMAGES>>  The examination revealed a small clot on the polypectomy site at 10cm from the anus. No active bleeding was seen on insertion of the colonoscope. 5 cc of epinephrine: saline (1:10,000U) mixture was injected into the base of the polypectomy site. Good blanching was noted. The clot could not be washed away with irrigation despite multiple attempts. A biopsy forcep was used to carefully remove the clot from the polypectomy site which revealed a small ulcer. A hemoclip misfired and was withdrawn during maneuvers to achieve a better angle on the polypectomy site. Patient was placed in the supine position in order to successfully deploy a Hemoclip due to the location of the polypectomy site. In addition, 3 cc of epinephrine and saline mixture was then injected into the base of the polypectomy site. Internal hemorrhoids were seen on retroflexion.    The scope was then withdrawn from the patient  and the procedure terminated.  COMPLICATIONS:  None  ENDOSCOPIC IMPRESSION: 1. Postpolypectomy bleed -s/p epinephrine:saline mixture injection (8cc total) and hemoclip placement 2. External and internal hemorrhoids  RECOMMENDATIONS: 1. No anticoagulation for 48 hours and then resume Coumadin at that time if stable 2. Clear liquids today and advance slowly tomorrow as tolerated 3. Follow H/Hs  REPEAT EXAM:  No  ______________________________ Charlott Rakes, MD  CC:  Dr. Herbert Moors Dr. Margarito Liner  n. eSIGNED:   Charlott Rakes at 05/26/2011 09:57 AM  Elyn Aquas, [phone]

## 2011-05-26 NOTE — Brief Op Note (Deleted)
Clears ok today and then advance tomorrow.  Hold off on all anticoagulation until Monday and then resume.

## 2011-05-26 NOTE — H&P (View-Only) (Signed)
Subjective:     Patient ID: [name], female   DOB: 07/17/1965, 45 y.o.   MRN: [id number]  HPI  Patient is a 45 year old female with a PMH listed below presents to the clinic for an acute visit after seeing blood with her stool starting this morning. Note that the patient is on warfarin for afib, this was recently stopped due to colonoscopy and endoscopy, during which a polyp was removed by Dr. Beverely Risen on 05/22/11, she started bridging with heparin and resumed warfarin from 05/23/11, she was tolerating this well, until this morning when she was on the toilet, she noticed blood in her stool.    Patient Active Problem List  Diagnoses  . UNSPECIFIED ANEMIA  . ANXIETY  . HYPERTROPHIC OBSTRUCTIVE CARDIOMYOPATHY  . VENTRICULAR TACHYCARDIA  . Atrial fibrillation  . Atrial Tachycardia  . ALLERGIC RHINITIS, SEASONAL  . GERD  . HYPERGLYCEMIA  . AUTOMATIC IMPLANTABLE CARDIAC DEFIBRILLATOR SITU  . Long-term (current) use of anticoagulants  . Pelvic mass  . Anemia  . Hematochezia   Current Outpatient Prescriptions on File Prior to Visit  Medication Sig Dispense Refill  . ALPRAZolam (XANAX) 0.25 MG tablet Take 0.25 mg by mouth daily as needed.       . diltiazem (CARDIZEM CD) 180 MG 24 hr capsule Take 180 mg by mouth daily.       Marland Kitchen dofetilide (TIKOSYN) 500 MCG capsule Take 500 mcg by mouth 2 (two) times daily.        Marland Kitchen esomeprazole (NEXIUM) 20 MG capsule Take 1 capsule (20 mg total) by mouth daily.  30 capsule  0  . metoprolol (TOPROL-XL) 50 MG 24 hr tablet Take 50 mg by mouth 2 (two) times daily.       . Omega-3 Fatty Acids (FISH OIL) 1200 MG CAPS Take 1 capsule (1,200 mg total) by mouth daily.      . propranolol (INDERAL) 20 MG tablet Take 20 mg by mouth every 6 (six) hours as needed.        . sertraline (ZOLOFT) 50 MG tablet Take 50 mg by mouth daily.       Marland Kitchen warfarin (COUMADIN) 4 MG tablet USE AS DIRECTED BY ANTICOAGULATION CLINIC  40 tablet  2  .No Known Allergies   Review of Systems    Gastrointestinal: Positive for abdominal pain, blood in stool, anal bleeding and rectal pain. Negative for nausea, vomiting, diarrhea, constipation and abdominal distention.       Objective:   Physical Exam  Vitals reviewed. Constitutional: She is oriented to person, place, and time. She appears well-developed and well-nourished.  HENT:  Head: Normocephalic and atraumatic.  Eyes: Pupils are equal, round, and reactive to light.  Neck: Normal range of motion. Neck supple. No JVD present. No thyromegaly present.  Cardiovascular: Normal rate, regular rhythm and normal heart sounds.   No murmur heard. Pulmonary/Chest: Effort normal and breath sounds normal. She has no wheezes. She has no rales.  Abdominal: Soft. Bowel sounds are normal. She exhibits no distension and no mass. There is tenderness. There is no rebound and no guarding.  Musculoskeletal: Normal range of motion. She exhibits no edema.  Neurological: She is alert and oriented to person, place, and time.  Skin: Skin is warm and dry.

## 2011-05-26 NOTE — H&P (Signed)
42 woman with HOCM and extensive past cardiac hx including Afib, ICD, and f/u at [facility] on dofetilide.  She is highly attuned to he rhythm and uses prn propranolol to control rate. This admission is for LGI bleeding after a polypectomy 3 days ago.  Had hematochezia w/o other sx.  Has been re-scoped by Dr. Bosie Clos this morning and the site treated.  Will be guided by GI as to re-starting anti-coagulation.  She is presently in a complex supra-ventricular rhythm, probably a mix of AFib and PVCs or Flutter.  She is used to waiting this out.  If irregular rhythm persists or becomes symptomatic, will have to consult cardiology.

## 2011-05-27 ENCOUNTER — Other Ambulatory Visit: Payer: Self-pay

## 2011-05-27 DIAGNOSIS — I4891 Unspecified atrial fibrillation: Secondary | ICD-10-CM

## 2011-05-27 LAB — BASIC METABOLIC PANEL
BUN: 5 mg/dL — ABNORMAL LOW (ref 6–23)
Creatinine, Ser: 0.7 mg/dL (ref 0.50–1.10)
Glucose, Bld: 87 mg/dL (ref 70–99)
Potassium: 3.4 mEq/L — ABNORMAL LOW (ref 3.5–5.1)
Sodium: 141 mEq/L (ref 135–145)

## 2011-05-27 MED ORDER — ALUM & MAG HYDROXIDE-SIMETH 200-200-20 MG/5ML PO SUSP
30.0000 mL | Freq: Once | ORAL | Status: AC
  Administered 2011-05-27: 30 mL via ORAL
  Filled 2011-05-27: qty 30

## 2011-05-27 MED ORDER — POTASSIUM CHLORIDE CRYS ER 20 MEQ PO TBCR
40.0000 meq | EXTENDED_RELEASE_TABLET | ORAL | Status: AC
  Administered 2011-05-27 (×2): 40 meq via ORAL
  Filled 2011-05-27 (×2): qty 2

## 2011-05-27 MED ORDER — MAGNESIUM SULFATE 50 % IJ SOLN: 2.0000 g | Freq: Once | INTRAMUSCULAR | Status: DC

## 2011-05-27 MED ORDER — MAGNESIUM SULFATE 40 MG/ML IJ SOLN
2.0000 g | Freq: Once | INTRAMUSCULAR | Status: AC
  Administered 2011-05-27: 2 g via INTRAVENOUS
  Filled 2011-05-27: qty 50

## 2011-05-27 NOTE — Progress Notes (Signed)
ANTICOAGULATION CONSULT NOTE - Initial Consult  Pharmacy Consult for Coumadin Indication: atrial fibrillation  No Known Allergies  Patient Measurements: Height: 5\' 6"  (167.6 cm) Weight: 166 lb 3.6 oz (75.4 kg) IBW/kg (Calculated) : 59.3    Vital Signs: Temp: 98.5 F (36.9 C) (12/16 0639) Temp src: Oral (12/16 0639) BP: 96/60 mmHg (12/16 0639) Pulse Rate: 98  (12/16 0639)  Labs:  Basename 05/27/11 1021 05/26/11 2051 05/26/11 0600 05/26/11 0035 05/25/11 1326 05/25/11 1322  HGB -- 9.4* 8.6* -- -- --  HCT -- 30.6* 27.5* 29.0* -- --  PLT -- 268 240 248 -- --  APTT -- -- -- -- -- --  LABPROT -- -- -- -- -- --  INR -- -- -- -- 1.2 --  HEPARINUNFRC -- -- -- -- -- --  CREATININE 0.70 -- -- -- -- 0.76  CKTOTAL -- -- -- -- -- --  CKMB -- -- -- -- -- --  TROPONINI -- -- -- -- -- --   Estimated Creatinine Clearance: 90.2 ml/min (by C-G formula based on Cr of 0.7).  Medical History: Past Medical History  Diagnosis Date  . Hypertrophic obstructive cardiomyopathy (HOCM)     TEE 09/02/2007 showed moderate to severe left ventricular hypertrophy with an interventricular septal dimension of 1.5 cm and posterior wall dimension of 7 cm; there was normal LV systolic function and estimated EF of approximately 60%; there did not appear to be any obvious outflow tract obstruction.   . Atrial fibrillation     S/P radiofrequency ablation by Dr. Clydie Braun at [facility] on 02/09/2008 and again on 03/31/2009; S/P ablation at [facility] by Dr. Marchia Bond on 08/10/2010, followed by recurrent atrial flutter/fibrillation  . Atrial tachycardia     Status post convergent ablation [facility] October 2012  . Ventricular tachycardia     Hx of nonsustained ventricular tachycardia, S/P hospitalization in March 2009; S/P ICD placement by Dr. Duke Salvia on 09/25/2007.  Marland Kitchen Anxiety     Acute stress reaction to multiple ICD shocks  . Automatic implantable cardiac defibrillator- st judes 09/2007  .  Seasonal allergic rhinitis   . GERD (gastroesophageal reflux disease)   . Hyperglycemia   . Dizziness   . Anemia   . Cholecystitis, acute 07/2001    S/P laparoscopic cholecystectomy, intraoperative cholangiogram, and transcystic common bile duct exploration by Dr. Avel Peace on 07/14/2001  . Pelvic mass     Pelvic ultrasound done on 11/30/10 to evaluate abdominal pain showed,  positioned between the ovaries and contiguous with the ovaries, a complex mass demonstrating posterior acoustical enhancement and no definite intralesional flow with color Doppler exam measuring 5.2 x 3.1 x 5.5 cm. The appearance raised the possibility of a ruptured ovarian cyst with subsequent development of a hematoma.   . Fibroids       Assessment: Bleeding s/p flex-sig with polypectomy.  AC for Hx of A-Fib currently in SR.    Coumadin has been on hold for the rectal bleeding.  Plan to restart Coumadin on Monday 12/17 Goal of Therapy:  INR 2-3   Plan:  No Couamdin tonight  Check INR in AM and restart coumadin 12/17   Marcelino Scot 05/27/2011,1:01 PM

## 2011-05-27 NOTE — Progress Notes (Signed)
Patient ID: [name], female   DOB: 08-13-1965, 45 y.o.   MRN: [id number] [facility] Gastroenterology Progress Note  [name] 45 y.o. Aug 09, 1965   Subjective: No bleeding overnight. No BMs. No abdominal pain. Feels ok.  Objective: Vital signs: Filed Vitals:   05/27/11 0639  BP: 96/60  Pulse: 98  Temp: 98.5 F (36.9 C)  Resp: 20    Intake/Output last 24 hrs:  Intake/Output Summary (Last 24 hours) at 05/27/11 1226 Last data filed at 05/27/11 0700  Gross per 24 hour  Intake   1120 ml  Output      0 ml  Net   1120 ml    Physical Exam: Gen: alert, no acute distress  Abd: soft, non-tender; bowel sounds normal; no masses,  no organomegaly  Lab Results:  [facility] 05/27/11 1021 05/25/11 1322  NA 141 142  K 3.4* 4.1  CL 106 105  CO2 27 27  GLUCOSE 87 93  BUN 5* 10  CREATININE 0.70 0.76  CALCIUM 9.3 9.8  MG 1.8 --  PHOS -- --    Basename 05/25/11 1322  AST 16  ALT 10  ALKPHOS 104  BILITOT 0.4  PROT 6.9  ALBUMIN 3.6    Basename 05/26/11 2051 05/26/11 0600 05/25/11 1322  WBC 7.0 5.2 --  NEUTROABS -- -- 5.3  HGB 9.4* 8.6* --  HCT 30.6* 27.5* --  MCV 86.2 85.9 --  PLT 268 240 --     Medications: I have reviewed the patient's current medications.  Assessment/Plan: 45yo s/p postpolypectomy bleed with flex sig yesterday where adherent clot noted at site without active bleeding. S/P epi injection and hemoclip placement. Doing ok without further bleeding. Anticoagulation on hold. If Lovenox bridging not needed then resume coumadin tomorrow and d/c Monday if stable. If cards feels that Lovenox bridging is necessary with Coumadin then would watch for 24 hours after starting anticoagulation back. Hold anticoagulation until Monday if possible. Agree with advancing diet. Follow H/Hs.   Iwalani Templeton C. 05/27/2011, 12:26 PM

## 2011-05-27 NOTE — Progress Notes (Addendum)
Subjective:    There were no events overnight. Currently, the patient confirms symptoms of loose stools without overt blood, occasional palpitations. She denies symptoms of chest pain, chest pressure/discomfort, dyspnea, orthopnea and paroxysmal nocturnal dyspnea, sweats, chills, fevers, nausea, vomiting, abdominal pain, over blood in stools.     Objective:    Vital Signs:   Temp:  [97.9 F (36.6 C)-99.5 F (37.5 C)] 98.5 F (36.9 C) (12/16 0639) Pulse Rate:  [61-98] 98  (12/16 0639) Resp:  [12-43] 20  (12/16 0639) BP: (88-120)/(50-80) 96/60 mmHg (12/16 0639) SpO2:  [91 %-100 %] 96 % (12/16 0639) Weight:  [166 lb 3.6 oz (75.4 kg)] 166 lb 3.6 oz (75.4 kg) (12/16 0639) Last BM Date: 05/26/11   Intake/Output:   Intake/Output Summary (Last 24 hours) at 05/27/11 0738 Last data filed at 05/26/11 2300  Gross per 24 hour  Intake    800 ml  Output      0 ml  Net    800 ml      Physical Exam: General: Vital signs reviewed and noted. Well-developed, well-nourished, in no acute distress; alert, appropriate and cooperative throughout examination.  Lungs:  Normal respiratory effort. Clear to auscultation BL without crackles or wheezes.  Heart: Irregular rhythm, regular rate S1 and S2 normal without gallop, murmur, or rubs.  Abdomen:  BS normoactive. Soft, Nondistended, non-tender.  No masses or organomegaly.  Extremities: No pretibial edema.     Labs:  Basic Metabolic Panel:  Lab 05/25/11 1610  NA 142  K 4.1  CL 105  CO2 27  GLUCOSE 93  BUN 10  CREATININE 0.76  CALCIUM 9.8  MG --  PHOS --    Liver Function Tests:  Lab 05/25/11 1322  AST 16  ALT 10  ALKPHOS 104  BILITOT 0.4  PROT 6.9  ALBUMIN 3.6    CBC:  Lab 05/26/11 2051 05/26/11 0600 05/26/11 0035 05/25/11 1645 05/25/11 1322  WBC 7.0 5.2 6.0 7.3 6.9  NEUTROABS -- -- -- -- 5.3  HGB 9.4* 8.6* 9.0* 9.7* 10.0*  HCT 30.6* 27.5* 29.0* 31.1* 33.1*  MCV 86.2 85.9 86.3 85.9 87.6  PLT 268 240 248 290 287     Microbiology: Results for orders placed during the hospital encounter of 05/19/10  MRSA PCR SCREENING     Status: Normal   Collection Time   05/20/10  1:18 AM      Component Value Range Status Comment   MRSA by PCR    NEGATIVE  Final    Value: NEGATIVE            The GeneXpert MRSA Assay (FDA     approved for NASAL specimens     only), is one component of a     comprehensive MRSA colonization     surveillance program. It is not     intended to diagnose MRSA     infection nor to guide or     monitor treatment for     MRSA infections.    Coagulation Studies:  Basename 05/25/11 1326  LABPROT --  INR 1.2    Other results:  Flexible sigmoidoscopy (12/15/21012) - Postpolypectomy bleed - s/p epinephrine:saline mixture injection (8cc total and hemoclip placement 2. External and internal hemorrhoids.      Medications:    Scheduled Medications:    . sodium chloride   Intravenous Once  . diltiazem  180 mg Oral q1800  . dofetilide  500 mcg Oral Q12H  . metoprolol  50 mg Oral  BID  . pantoprazole  40 mg Oral Daily  . sertraline  50 mg Oral Daily  . DISCONTD: diltiazem  180 mg Oral Daily    PRN Medications: ALPRAZolam, EPINEPHrine 1:10,000, 10 mL syringe/NS, 10 mL vial for sclerotherapy inj mixture, fentaNYL, midazolam, propranolol   Assessment/ Plan:    Pt is a 45 y.o. yo female with a PMHx of atrial fibrillation on chronic coumadin therapy, atrial tachycardias (s/p multiple ablations), ventricular tachycardias s/p defibrillator, recent colonoscopy on 05/22/2011 s/p polyepectomy who was admitted on 05/25/2011 with symptoms of BRBPR. She had Flexible sigmoidoscopy performed on 05/26/2011 and is now s/p epi injection and hemoclip placement.   1. GI bleed - Hgb stable overnight. 2/2 to post-polypectomy (confirmed on flex sig 05/26/2011) bleed in the setting of chronic anticoagulation. Pt also notably has internal and external hemorrhoids. Pt is now s/p epi injection and  hemoclip placement.    Appreciate GI input in the care of this patient. Will advance diet today, observe, and confer with GI about when would be appropriate for DC home.  2. Atrial fibrillation - Has been rate controlled overnight grossly, however, few episodes of HR in 120s with afib pattern. On chronic coumadin therapy at home, as well on multiple antiarrhythmics that have been continued during the hospital course.   Hold coumadin until 12/17 per GI recommendations.  Continue current antiarrhythmics.  Continue monitoring.  Will place [facility] cardiology consult to address the following (Dr. Graciela Husbands is her EP doctor in [place], also follows at [facility])  GI prefers cardiology input regarding anticoagulation, if she needs bridging, if ok to continue holding coumadin (now that bleed stabilized)  IM would appreciate cardiology input regarding her antiarrhythmics, intermittent complex supra-ventricular patterns given her complex cardiac history.  3. Anxiety - stable at present.  Continue home regimen of Zoloft and Xanax BID PRN anxiety  4. Acute blood loss anemia in setting of chronic baseline anemia - acutely worsened by GI blood loss as mentioned above. Prior Hgb baseline of 12 until 12/2010, and has since had declined to 9-10. Ferritin in 04/2011 of 37, Folate normal, B12 normal.  Consider to change to qday CBC, as Hgb is stable at this time. Consider to transfuse if < 7 or if significantly symptomatic.  5. GERD - stable. Continue PPI.  6. History of atrial and ventricular tachycarrhymias - s/p defibrillator placement   Continue to monitor on telemetry.   Continue antiarrhythmics.    Length of Stay: 2   Johnette Abraham, Norman Herrlich, Internal Medicine Resident 05/27/2011, 7:38 AM
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
## 2011-05-28 ENCOUNTER — Encounter (HOSPITAL_COMMUNITY): Payer: Self-pay | Admitting: Gastroenterology

## 2011-05-28 ENCOUNTER — Ambulatory Visit: Payer: BC Managed Care – PPO

## 2011-05-28 DIAGNOSIS — F411 Generalized anxiety disorder: Secondary | ICD-10-CM

## 2011-05-28 LAB — CBC
HCT: 30.1 % — ABNORMAL LOW (ref 36.0–46.0)
Hemoglobin: 9.3 g/dL — ABNORMAL LOW (ref 12.0–15.0)
MCH: 26.7 pg (ref 26.0–34.0)
RBC: 3.48 MIL/uL — ABNORMAL LOW (ref 3.87–5.11)

## 2011-05-28 LAB — PROTIME-INR: Prothrombin Time: 15.4 seconds — ABNORMAL HIGH (ref 11.6–15.2)

## 2011-05-28 MED ORDER — WARFARIN SODIUM 5 MG PO TABS
5.0000 mg | ORAL_TABLET | Freq: Once | ORAL | Status: DC
  Filled 2011-05-28: qty 1

## 2011-05-28 MED ORDER — WARFARIN SODIUM 5 MG PO TABS: 5.0000 mg | ORAL_TABLET | Freq: Once | ORAL | Status: DC

## 2011-05-28 NOTE — Progress Notes (Signed)
Internal Medicine Teaching Service Attending Note Date: 05/28/2011  Patient name: [name]  Medical record number: [id number]  Date of birth: [birth year]    This patient has been seen and discussed with the house staff. Please see their note for complete details. I concur with their findings with the following additions/corrections:  Patient is doing well. She was given K+ and Mg++ supplementation. Her Hb is stable. She ate breakfast and is ready to go home. She will restart her coumadin today. She does NOT need lovenox in the future for bridging. She will follow up with Dr. Sheran Luz at [facility]. Appreiciate cardiology and GI help. WOODYEAR,WYNNE E 05/28/2011, 9:39 AM

## 2011-05-28 NOTE — Progress Notes (Signed)
Discharge Note:  S: patient is doing very well, no episodes of bleeding per rectum, or palpitations  O:  Filed Vitals:   05/28/11 1012  BP: 95/69  Pulse: 69  Temp:   Resp: 16   General: middle aged woman sitting comfortably in chair HEENT: PERRL, EOMI, no scleral icterus Cardiac: RRR, no rubs, murmurs or gallops Pulm: clear to auscultation bilaterally, moving normal volumes of air Abd: soft, nontender, nondistended, BS present Ext: warm and well perfused, no pedal edema Neuro: alert and oriented X3, cranial nerves II-XII grossly intact  A/P: -discharge to home with close follow up with primary care and coumadin clinic -will load with coumadin, 5mg  today and then 4mg  daily until sees Dr. Alexandria Lodge on Friday

## 2011-05-28 NOTE — Progress Notes (Signed)
ANTICOAGULATION CONSULT NOTE - Initial Consult  Pharmacy Consult for Coumadin Indication: atrial fibrillation  No Known Allergies  Patient Measurements: Height: 5\' 6"  (167.6 cm) Weight: 166 lb 7.2 oz (75.5 kg) IBW/kg (Calculated) : 59.3    Vital Signs: Temp: 98.6 F (37 C) (12/17 0529) Temp src: Oral (12/17 0529) BP: 92/62 mmHg (12/17 0529) Pulse Rate: 94  (12/17 0529)  Labs:  Basename 05/28/11 0645 05/27/11 1021 05/26/11 2051 05/26/11 0600 05/25/11 1326 05/25/11 1322  HGB 9.3* -- 9.4* -- -- --  HCT 30.1* -- 30.6* 27.5* -- --  PLT 258 -- 268 240 -- --  APTT -- -- -- -- -- --  LABPROT 15.4* -- -- -- -- --  INR 1.19 -- -- -- 1.2 --  HEPARINUNFRC -- -- -- -- -- --  CREATININE -- 0.70 -- -- -- 0.76  CKTOTAL -- -- -- -- -- --  CKMB -- -- -- -- -- --  TROPONINI -- -- -- -- -- --   Estimated Creatinine Clearance: 90.3 ml/min (by C-G formula based on Cr of 0.7).  Medical History: Past Medical History  Diagnosis Date  . Hypertrophic obstructive cardiomyopathy (HOCM)     TEE 09/02/2007 showed moderate to severe left ventricular hypertrophy with an interventricular septal dimension of 1.5 cm and posterior wall dimension of 7 cm; there was normal LV systolic function and estimated EF of approximately 60%; there did not appear to be any obvious outflow tract obstruction.   . Atrial fibrillation     S/P radiofrequency ablation by Dr. Clydie Braun at [facility] on 02/09/2008 and again on 03/31/2009; S/P ablation at [facility] by Dr. Marchia Bond on 08/10/2010, followed by recurrent atrial flutter/fibrillation  . Atrial tachycardia     Status post convergent ablation [facility] October 2012  . Ventricular tachycardia     Hx of nonsustained ventricular tachycardia, S/P hospitalization in March 2009; S/P ICD placement by Dr. Duke Salvia on 09/25/2007.  Marland Kitchen Anxiety     Acute stress reaction to multiple ICD shocks  . Automatic implantable cardiac defibrillator- st judes 09/2007    . Seasonal allergic rhinitis   . GERD (gastroesophageal reflux disease)   . Hyperglycemia   . Dizziness   . Anemia   . Cholecystitis, acute 07/2001    S/P laparoscopic cholecystectomy, intraoperative cholangiogram, and transcystic common bile duct exploration by Dr. Avel Peace on 07/14/2001  . Pelvic mass     Pelvic ultrasound done on 11/30/10 to evaluate abdominal pain showed,  positioned between the ovaries and contiguous with the ovaries, a complex mass demonstrating posterior acoustical enhancement and no definite intralesional flow with color Doppler exam measuring 5.2 x 3.1 x 5.5 cm. The appearance raised the possibility of a ruptured ovarian cyst with subsequent development of a hematoma.   . Fibroids       Assessment: 3 YOF with chronic Afib on Coumadin.  S/p GIB d/t post-polypectomy and Coumadin to resume today without Lovenox/heparin bridging.  INR subtherapeutic.  Will be conservative with Coumadin dosing.  Goal of Therapy:  INR 2-3   Plan:  1.  Coumadin 5mg  PO today 2.  Daily PT/INR 3.  Continue to monitor K+ and QTc while pt is on Tikosyn.   Phillips Climes, PharmD 05/28/2011,9:41 AM

## 2011-05-28 NOTE — Discharge Summary (Signed)
Internal Medicine Teaching [facility] Discharge Note  Name: [name] MRN: [id number] DOB: 15-Feb-1966 45 y.o.  Date of Admission: 05/25/2011  3:07 PM Date of Discharge: 05/28/2011 Attending Physician: Zoila Shutter  Discharge Diagnosis: Principal Problem:  *GI bleed- patient is on coumadin chronically for history of atrial fibrillation and atrial tachycardia. Her lower GI bleed was very likely due to bleeding from recent polypectomy site. She was on both lovenox and coumadin at the time of her bleeding as she had stopped coumadin before the procedure and was bridging back to coumadin with lovenox. Active Problems: Atrial Tachycardia- patient had some atrial tachycardia on Telemetry on 12/16 which likely occurred in the setting of potassium depletion due to poor PO intake for several days. This likely occurs intermittently.  Discharge Medications: Current Discharge Medication List    CONTINUE these medications which have CHANGED   Details  warfarin (COUMADIN) 5 MG tablet Take 1 tablet (5 mg total) by mouth one time only at 6 PM. Qty: 1 tablet, Refills: 0      CONTINUE these medications which have NOT CHANGED   Details  acetaminophen (TYLENOL) 500 MG tablet Take 1,000 mg by mouth every 6 (six) hours as needed. For pain     ALPRAZolam (XANAX) 0.25 MG tablet Take 0.125-0.25 mg by mouth daily as needed. For anxiety.    diltiazem (CARDIZEM CD) 180 MG 24 hr capsule Take 180 mg by mouth daily.     dofetilide (TIKOSYN) 500 MCG capsule Take 500 mcg by mouth 2 (two) times daily.      esomeprazole (NEXIUM) 20 MG capsule Take 1 capsule (20 mg total) by mouth daily. Qty: 30 capsule, Refills: 0    metoprolol (TOPROL-XL) 50 MG 24 hr tablet Take 50 mg by mouth 2 (two) times daily.     Omega-3 Fatty Acids (FISH OIL) 1200 MG CAPS Take 1 capsule (1,200 mg total) by mouth daily.    sertraline (ZOLOFT) 50 MG tablet Take 50 mg by mouth daily.    Associated Diagnoses: Anxiety state,  unspecified    propranolol (INDERAL) 20 MG tablet Take 20 mg by mouth every 6 (six) hours as needed. As needed for increased heart rate      STOP taking these medications     enoxaparin (LOVENOX) 80 MG/0.8ML SOLN injection         Disposition and follow-up:   [name] was discharged from [facility] in Good condition.    Follow-up Appointments: Follow-up Information    Follow up with TOBBIA,PATRICK on 05/30/2011. (at 2:45 PM)    Contact information:   [address] [place] [address] [phone]       Follow up with Gar Ponto, PHARMD on 06/01/2011. (at 10:30AM (adjustments to this time can be made between you and Dr. Alexandria Lodge ))         Discharge Orders    Future Appointments: Provider: Department: Dept Phone: Center:   05/30/2011 2:45 PM Darnelle Maffucci [facility] [phone] [facility]   06/01/2011 10:30 AM [facility] [phone] [facility]   07/19/2011 11:00 AM Amber Caryl Bis, RN [facility] [phone] [facility]     Future Orders Please Complete By Expires   Diet - low sodium heart healthy      Increase activity slowly      Call MD for:      Comments:   Call physician for continued bleeding from rectum, black colored  stools or red colored stools. Call physician if your heart is racing, you have chest pain or if you feel dizzy or short of breath.   Discharge instructions      Comments:   Please take 5mg  (One 4mg  tablet and 1/2 tablet of 2mg ) of coumadin tonight, and then 4mg  daily until you follow up with Dr. Alexandria Lodge on Friday.      Consultations:   -Rounding [facility]-  Florencia Reasons, MD-Gastroenterology  Procedures Performed:  No results found.  Admission HPI:  Patient is a 45 year old woman with atrial fibrillation on chronic coumadin therapy who presents with 5 episodes of passing bright red blood per rectum along with blood clots this morning. She had stopped  her coumadin 6 days ago in preparation for an endoscopy/colonosocpy with her gastroenterologist this Tuesday. Per her primary care physician's request, who did not want her to be without anticoagulation for an extended period of time, the patient was bridged back to coumadin with lovenox injections that began two days prior to the procedure on Sunday. She had also restarted her coumadin after the procedure two days prior to admission. Dr. Evette Cristal removed a 1cm sessile polyp from the patient's rectum which appeared benign. The area was cauterized with no bleeding after the procedure. The patient had not had a bowel movement for 2 days after the procedure and her first bowel movement occurred soon after the first episode of passing blood. The bowel movement was formed and was tan in color. The patient was seen and evaluated at the primary care clinic and was directly admitted to the floor. The patient denied any chest pain, shortness of breath, or dizziness. She reports some nausea and mild abdominal pressure but no pain. She was evaluated for worsening anemia by Dr. Meredith Pel in November and she was sent for colonoscopy/endoscopy to work up this issue. Her hemoglobin baseline was in the 12s and the had decreased to 10s in four month's time.  Notably, the patient had an episode of severe abdominal pain in June and she was treated with a PPI and was sent home from the ED. She had a CT scan done at that time that showed a heterogenous mass around her ovary which was though to represent hemorrhage from a ruptured ovarian cyst. The lesion was followed up and was found to shrink in size on two follow up ultrasounds although a new similar lesion appeared per patien. The patient appears to have gone through menopause as she has not menstruated for several months. Her Ob/gyn recommended starting provera therapy but patient has not yet begun it.  Admitting Physical Exam:  Blood pressure 107/73, pulse 60, temperature 98.7 F  (37.1 C), temperature source Oral, resp. rate 19, height 5\' 6"  (1.676 m), weight 170 lb 13.7 oz (77.5 kg), SpO2 100.00%.  General: friendly middle aged woman resting in bed in no apparent distress  HEENT: PERRL, EOMI, no scleral icterus  Cardiac: RRR, no rubs, murmurs or gallops  Pulm: clear to auscultation bilaterally, moving normal volumes of air  Abd: soft, nontender, nondistended, BS present  Ext: warm and well perfused, no pedal edema  Neuro: alert and oriented X3, cranial nerves II-XII grossly intact  Hospital Course by problem list:  *GI bleed- patient is on coumadin chronically for history of atrial fibrillation and atrial tachycardia. Her coumadin was stopped on 12/6 prior to colonoscopy/endoscopy on 12/11. She was started on Lovenox on 12/10 and continued with BID dosing until the day prior to admission. She  was also restarted on coumadin 2 days prior to admission. Patient was passing bright red blood and blood clots per rectum that were observed after admission. She underwent flexible sigmoidoscopy the day after admission and a small clot was found at the site of her polypectomy. A clip was placed and epinephrine was injected at the site. The patient has had no further episodes of bleeding per rectum or abdominal pain. Her hemoglobin has remained above 8.6 and she did not require any transfusions.  Active Problems: Atrial Tachycardia- patient had episodes of atrial tachycardia on Telemetry on 12/16 which likely occurred in the setting of potassium depletion due to poor PO intake for several days. Cardiology was consulted and felt that rhythm was not concerning and was consistent with her known atrial tachycardia with rate <100 bpm. Her potassium was repleted PO X2.  Cardiology also felt that patient does not need to be bridged with lovenox in the future if she has to stop her coumadin for a procedure.   Discharge Vitals:  BP 95/69  Pulse 69  Temp(Src) 98.6 F (37 C) (Oral)   Resp 16  Ht 5\' 6"  (1.676 m)  Wt 166 lb 7.2 oz (75.5 kg)  BMI 26.87 kg/m2  SpO2 95%  LMP 12/24/2010  Discharge Labs:  Results for orders placed during the hospital encounter of 05/25/11 (from the past 24 hour(s))  PROTIME-INR     Status: Abnormal   Collection Time   05/28/11  6:45 AM      Component Value Range   Prothrombin Time 15.4 (*) 11.6 - 15.2 (seconds)   INR 1.19  0.00 - 1.49   CBC     Status: Abnormal   Collection Time   05/28/11  6:45 AM      Component Value Range   WBC 7.2  4.0 - 10.5 (K/uL)   RBC 3.48 (*) 3.87 - 5.11 (MIL/uL)   Hemoglobin 9.3 (*) 12.0 - 15.0 (g/dL)   HCT 82.9 (*) 56.2 - 46.0 (%)   MCV 86.5  78.0 - 100.0 (fL)   MCH 26.7  26.0 - 34.0 (pg)   MCHC 30.9  30.0 - 36.0 (g/dL)   RDW 13.0  86.5 - 78.4 (%)   Platelets 258  150 - 400 (K/uL)    Signed: THOMAS, SONYA 05/28/2011, 11:03 AM

## 2011-05-29 LAB — TYPE AND SCREEN: Antibody Screen: NEGATIVE

## 2011-05-30 ENCOUNTER — Ambulatory Visit (INDEPENDENT_AMBULATORY_CARE_PROVIDER_SITE_OTHER): Payer: BC Managed Care – PPO | Admitting: Internal Medicine

## 2011-05-30 ENCOUNTER — Encounter: Payer: Self-pay | Admitting: Internal Medicine

## 2011-05-30 DIAGNOSIS — K921 Melena: Secondary | ICD-10-CM

## 2011-05-30 DIAGNOSIS — E876 Hypokalemia: Secondary | ICD-10-CM

## 2011-05-30 LAB — CBC
HCT: 29.3 % — ABNORMAL LOW (ref 36.0–46.0)
Hemoglobin: 9 g/dL — ABNORMAL LOW (ref 12.0–15.0)
MCHC: 30.7 g/dL (ref 30.0–36.0)
MCV: 87.2 fL (ref 78.0–100.0)
RBC: 3.36 MIL/uL — ABNORMAL LOW (ref 3.87–5.11)
RDW: 14.4 % (ref 11.5–15.5)

## 2011-05-30 LAB — BASIC METABOLIC PANEL
BUN: 13 mg/dL (ref 6–23)
Chloride: 101 mEq/L (ref 96–112)
Creat: 0.76 mg/dL (ref 0.50–1.10)
Glucose, Bld: 84 mg/dL (ref 70–99)
Potassium: 4.1 mEq/L (ref 3.5–5.3)

## 2011-05-30 NOTE — Progress Notes (Signed)
Subjective:     Patient ID: [name], female   DOB: 12/08/1965, 45 y.o.   MRN: [id number]  HPI  Patient is a 45 year old female with a past medical history listed below, presents to the outpatient clinic for hospital followup after she was admitted for hematochezia in the setting patient being on warfarin with Lovenox bridging after having undergone a colonoscopy with polypectomy 24 hours prior to initiation of bridging. Patient was admitted for monitoring, bleeding stopped during hospitalization, hemoglobin was stable, today patient has no complaints. Denies new episodes of hematochezia has been started on warfarin without bridging this time form Monday night  Patient Active Problem List  Diagnoses  . UNSPECIFIED ANEMIA  . ANXIETY  . HYPERTROPHIC OBSTRUCTIVE CARDIOMYOPATHY  . VENTRICULAR TACHYCARDIA  . Atrial fibrillation  . Atrial Tachycardia  . ALLERGIC RHINITIS, SEASONAL  . GERD  . HYPERGLYCEMIA  . AUTOMATIC IMPLANTABLE CARDIAC DEFIBRILLATOR SITU  . Long-term (current) use of anticoagulants  . Pelvic mass  . Anemia  . Hematochezia  . GI bleed   Current Outpatient Prescriptions on File Prior to Visit  Medication Sig Dispense Refill  . acetaminophen (TYLENOL) 500 MG tablet Take 1,000 mg by mouth every 6 (six) hours as needed. For pain       . ALPRAZolam (XANAX) 0.25 MG tablet Take 0.125-0.25 mg by mouth daily as needed. For anxiety.      Marland Kitchen diltiazem (CARDIZEM CD) 180 MG 24 hr capsule Take 180 mg by mouth daily.       Marland Kitchen dofetilide (TIKOSYN) 500 MCG capsule Take 500 mcg by mouth 2 (two) times daily.        Marland Kitchen esomeprazole (NEXIUM) 20 MG capsule Take 1 capsule (20 mg total) by mouth daily.  30 capsule  0  . metoprolol (TOPROL-XL) 50 MG 24 hr tablet Take 50 mg by mouth 2 (two) times daily.       . Omega-3 Fatty Acids (FISH OIL) 1200 MG CAPS Take 1 capsule (1,200 mg total) by mouth daily.      . propranolol (INDERAL) 20 MG tablet Take 20 mg by mouth every 6 (six) hours as needed.  As needed for increased heart rate      . sertraline (ZOLOFT) 50 MG tablet Take 50 mg by mouth daily.       Marland Kitchen warfarin (COUMADIN) 5 MG tablet Take 1 tablet (5 mg total) by mouth one time only at 6 PM.  1 tablet  0   No Known Allergies  Review of Systems     Objective:   Physical Exam

## 2011-05-30 NOTE — Assessment & Plan Note (Signed)
Patient has a history of recent poly removal on 05/22/11 and was started on lovenox bridge and restarted warfarin since 05/23/11, presented to the clinic on 05/25/11 with hematochezia, the likely source of bleed was the polyp. She was admitted overnight to the hospital for monitoring, he presents for followup, since hospitalization the bleeding had resolved, patient currently denies hematochezia. Has been on warfarin without bridging since 05/28/2011. Today we'll check a basic metabolic panel and a CBC. We'll not check INR as patient is scheduled to see Dr. Alexandria Lodge on Friday, December 21.

## 2011-05-30 NOTE — Patient Instructions (Signed)

## 2011-06-01 ENCOUNTER — Ambulatory Visit (INDEPENDENT_AMBULATORY_CARE_PROVIDER_SITE_OTHER): Payer: BC Managed Care – PPO | Admitting: Pharmacist

## 2011-06-01 DIAGNOSIS — I4891 Unspecified atrial fibrillation: Secondary | ICD-10-CM

## 2011-06-01 DIAGNOSIS — Z7901 Long term (current) use of anticoagulants: Secondary | ICD-10-CM

## 2011-06-01 LAB — POCT INR: INR: 1.9

## 2011-06-01 NOTE — Progress Notes (Signed)
Anti-Coagulation Progress Note  [name] is a 45 y.o. female who is currently on an anti-coagulation regimen.    RECENT RESULTS: Recent results are below, the most recent result is correlated with a dose of 5mg  each day since discharge on Monday of this week. Lab Results  Component Value Date   INR 1.90 06/01/2011   INR 1.19 05/28/2011   INR 1.2 05/25/2011    ANTI-COAG DOSE:   Latest dosing instructions   Total Sun Mon Tue Wed Thu Fri Sat   32 4 mg 4 mg 6 mg 4 mg 4 mg 6 mg 4 mg    (4 mg1) (4 mg1) (4 mg1.5) (4 mg1) (4 mg1) (4 mg1.5) (4 mg1)         ANTICOAG SUMMARY: Anticoagulation Episode Summary              Current INR goal 2.0-3.0 Next INR check 06/18/2011   INR from last check 1.90! (06/01/2011)     Weekly max dose (mg)  Target end date Indefinite   Indications Long-term (current) use of anticoagulants, A-fib (Resolved)   INR check location [facility] Preferred lab    Send INR reminders to ANTICOAG IMP   Comments        Provider Role Specialty Phone number   Farley Ly, MD  Internal Medicine [phone]        ANTICOAG TODAY: Anticoagulation Summary as of 06/01/2011              INR goal 2.0-3.0     Selected INR 1.90! (06/01/2011) Next INR check 06/18/2011   Weekly max dose (mg)  Target end date Indefinite   Indications Long-term (current) use of anticoagulants, A-fib (Resolved)    Anticoagulation Episode Summary              INR check location [facility] Preferred lab    Send INR reminders to ANTICOAG IMP   Comments        Provider Role Specialty Phone number   Farley Ly, MD  Internal Medicine [phone]        PATIENT INSTRUCTIONS: Patient Instructions  Patient instructed to take medications as defined in the Anti-coagulation Track section of this encounter.  Patient instructed to take today's dose.  Patient verbalized understanding of these instructions.        FOLLOW-UP Return in 2 weeks (on 06/18/2011)  for Follow up INR.  Hulen Luster, III Pharm.D., CACP

## 2011-06-01 NOTE — Patient Instructions (Signed)
Patient instructed to take medications as defined in the Anti-coagulation Track section of this encounter.  Patient instructed to take today's dose.  Patient verbalized understanding of these instructions.    

## 2011-06-18 ENCOUNTER — Ambulatory Visit (INDEPENDENT_AMBULATORY_CARE_PROVIDER_SITE_OTHER): Payer: BC Managed Care – PPO | Admitting: Pharmacist

## 2011-06-18 DIAGNOSIS — I4891 Unspecified atrial fibrillation: Secondary | ICD-10-CM

## 2011-06-18 DIAGNOSIS — Z7901 Long term (current) use of anticoagulants: Secondary | ICD-10-CM

## 2011-06-18 NOTE — Progress Notes (Signed)
Anti-Coagulation Progress Note  [name] is a 46 y.o. female who is currently on an anti-coagulation regimen.    RECENT RESULTS: Recent results are below, the most recent result is correlated with a dose of 32 mg. per week: Lab Results  Component Value Date   INR 5.40 06/18/2011   INR 1.90 06/01/2011   INR 1.19 05/28/2011    ANTI-COAG DOSE:   Latest dosing instructions   Total Sun Mon Tue Wed Thu Fri Sat   28 4 mg 4 mg 4 mg 4 mg 4 mg 4 mg 4 mg    (4 mg1) (4 mg1) (4 mg1) (4 mg1) (4 mg1) (4 mg1) (4 mg1)         ANTICOAG SUMMARY: Anticoagulation Episode Summary              Current INR goal 2.0-3.0 Next INR check 07/09/2011   INR from last check 5.40! (06/18/2011)     Weekly max dose (mg)  Target end date Indefinite   Indications Long-term (current) use of anticoagulants, A-fib (Resolved)   INR check location [facility] Preferred lab    Send INR reminders to ANTICOAG IMP   Comments        Provider Role Specialty Phone number   Farley Ly, MD  Internal Medicine [phone]        ANTICOAG TODAY: Anticoagulation Summary as of 06/18/2011              INR goal 2.0-3.0     Selected INR 5.40! (06/18/2011) Next INR check 07/09/2011   Weekly max dose (mg)  Target end date Indefinite   Indications Long-term (current) use of anticoagulants, A-fib (Resolved)    Anticoagulation Episode Summary              INR check location [facility] Preferred lab    Send INR reminders to ANTICOAG IMP   Comments        Provider Role Specialty Phone number   Farley Ly, MD  Internal Medicine [phone]        PATIENT INSTRUCTIONS: Patient Instructions  Patient instructed to take medications as defined in the Anti-coagulation Track section of this encounter.  Patient instructed to OMIT/HOLD today's dose.  Patient verbalized understanding of these instructions.        FOLLOW-UP Return in 3 weeks (on 07/09/2011) for Follow up INR.  Hulen Luster,  III Pharm.D., CACP

## 2011-06-18 NOTE — Patient Instructions (Signed)
Patient instructed to take medications as defined in the Anti-coagulation Track section of this encounter.  Patient instructed to OMIT/HOLD today's dose.  Patient verbalized understanding of these instructions.    

## 2011-06-25 ENCOUNTER — Encounter: Payer: Self-pay | Admitting: Internal Medicine

## 2011-06-25 DIAGNOSIS — D128 Benign neoplasm of rectum: Secondary | ICD-10-CM

## 2011-06-25 NOTE — Progress Notes (Signed)
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 Evette Cristal added to problem list and medical history.

## 2011-07-09 ENCOUNTER — Ambulatory Visit (INDEPENDENT_AMBULATORY_CARE_PROVIDER_SITE_OTHER): Payer: BC Managed Care – PPO | Admitting: Pharmacist

## 2011-07-09 DIAGNOSIS — D649 Anemia, unspecified: Secondary | ICD-10-CM

## 2011-07-09 DIAGNOSIS — Z7901 Long term (current) use of anticoagulants: Secondary | ICD-10-CM

## 2011-07-09 NOTE — Patient Instructions (Signed)
Patient instructed to take medications as defined in the Anti-coagulation Track section of this encounter.  Patient instructed to take today's dose.  Patient verbalized understanding of these instructions.    

## 2011-07-09 NOTE — Progress Notes (Signed)
Anti-Coagulation Progress Note  [name] is a 46 y.o. female who is currently on an anti-coagulation regimen.    RECENT RESULTS: Recent results are below, the most recent result is correlated with a dose of 28 mg. per week: Lab Results  Component Value Date   INR 3.30 07/09/2011   INR 5.40 06/18/2011   INR 1.90 06/01/2011    ANTI-COAG DOSE:   Latest dosing instructions   Total Sun Mon Tue Wed Thu Fri Sat   28 4 mg 4 mg 4 mg 4 mg 4 mg 4 mg 4 mg    (4 mg1) (4 mg1) (4 mg1) (4 mg1) (4 mg1) (4 mg1) (4 mg1)         ANTICOAG SUMMARY: Anticoagulation Episode Summary              Current INR goal 2.0-3.0 Next INR check 07/30/2011   INR from last check 3.30! (07/09/2011)     Weekly max dose (mg)  Target end date Indefinite   Indications Long-term (current) use of anticoagulants, A-fib (Resolved)   INR check location [facility] Preferred lab    Send INR reminders to ANTICOAG IMP   Comments        Provider Role Specialty Phone number   Farley Ly, MD  Internal Medicine [phone]        ANTICOAG TODAY: Anticoagulation Summary as of 07/09/2011              INR goal 2.0-3.0     Selected INR 3.30! (07/09/2011) Next INR check 07/30/2011   Weekly max dose (mg)  Target end date Indefinite   Indications Long-term (current) use of anticoagulants, A-fib (Resolved)    Anticoagulation Episode Summary              INR check location [facility] Preferred lab    Send INR reminders to ANTICOAG IMP   Comments        Provider Role Specialty Phone number   Farley Ly, MD  Internal Medicine [phone]        PATIENT INSTRUCTIONS: Patient Instructions  Patient instructed to take medications as defined in the Anti-coagulation Track section of this encounter.  Patient instructed to take today's dose.  Patient verbalized understanding of these instructions.        FOLLOW-UP Return in 3 weeks (on 07/30/2011) for Follow up INR.  Hulen Luster,  III Pharm.D., CACP

## 2011-07-10 ENCOUNTER — Other Ambulatory Visit: Payer: Self-pay | Admitting: Internal Medicine

## 2011-07-10 DIAGNOSIS — D649 Anemia, unspecified: Secondary | ICD-10-CM

## 2011-07-10 LAB — CBC
HCT: 33.1 % — ABNORMAL LOW (ref 36.0–46.0)
MCHC: 29.3 g/dL — ABNORMAL LOW (ref 30.0–36.0)
MCV: 84.4 fL (ref 78.0–100.0)
RDW: 16.9 % — ABNORMAL HIGH (ref 11.5–15.5)

## 2011-07-10 NOTE — Progress Notes (Signed)
I was asked to review CBC ordered by Dr Phillips Odor since Dr Meredith Pel is on B service and currently unavailable. Slow trend down of HgB from 13 in 2013 to 9.7 now. Colonoscopy unrevealing. Did not see EGD and sm bowel bx. Ferritin 37. Vit B12 nl but RDW increased. Will check MMA to make certain not early B12 def. Check haptoglobin and LDH to R/O hemolysis. Check TTG to R/O malabsorption as cause of low iron.

## 2011-07-12 ENCOUNTER — Other Ambulatory Visit (INDEPENDENT_AMBULATORY_CARE_PROVIDER_SITE_OTHER): Payer: BC Managed Care – PPO

## 2011-07-12 ENCOUNTER — Other Ambulatory Visit: Payer: Self-pay | Admitting: Internal Medicine

## 2011-07-12 DIAGNOSIS — D649 Anemia, unspecified: Secondary | ICD-10-CM

## 2011-07-12 DIAGNOSIS — R946 Abnormal results of thyroid function studies: Secondary | ICD-10-CM | POA: Insufficient documentation

## 2011-07-13 LAB — RETICULOCYTES
ABS Retic: 74.3 10*3/uL (ref 19.0–186.0)
RBC.: 4.13 MIL/uL (ref 3.87–5.11)
Retic Ct Pct: 1.8 % (ref 0.4–2.3)

## 2011-07-13 LAB — TSH: TSH: 1.87 u[IU]/mL (ref 0.350–4.500)

## 2011-07-13 LAB — TISSUE TRANSGLUTAMINASE, IGA: Tissue Transglutaminase Ab, IgA: 3.3 U/mL (ref <20)

## 2011-07-16 IMAGING — US US PELVIS COMPLETE
1 series · 12 of 25 positions shown · non-contrast
Comparison: CT 11/17/2010

CLINICAL DATA: CT performed 2 weeks ago for abdominal and pelvic
pain demonstrated a soft tissue density posterior to the uterus. On
Coumadin.  LMP 11/06/2010

TRANSABDOMINAL AND TRANSVAGINAL ULTRASOUND OF PELVIS
TECHNIQUE: Both transabdominal and transvaginal ultrasound
examinations of the pelvis were performed. Transabdominal technique
was performed for global imaging of the pelvis including uterus,
ovaries, adnexal regions, and pelvic cul-de-sac.

[Series 1: us pelvis complete · 92 acquisitions, 12 frames shown]
[im 4/92]
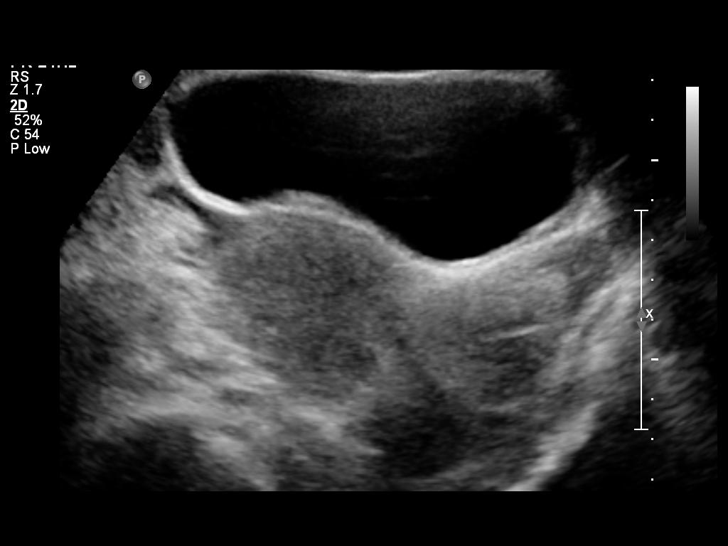
[im 12/92]
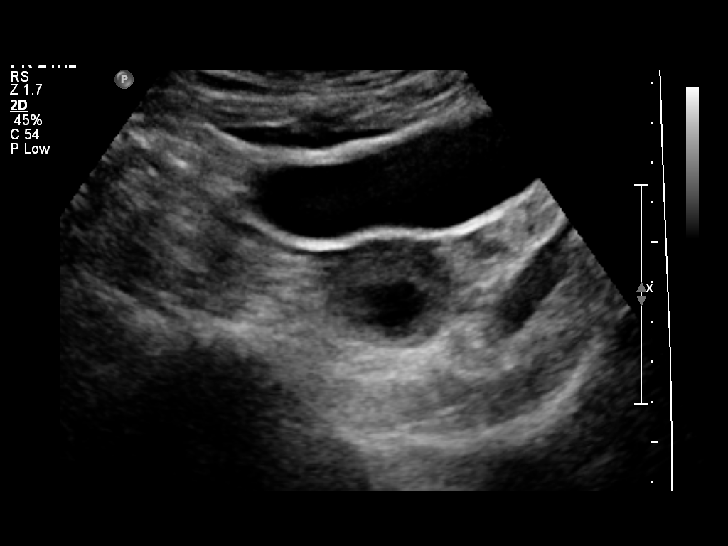
[im 19/92]
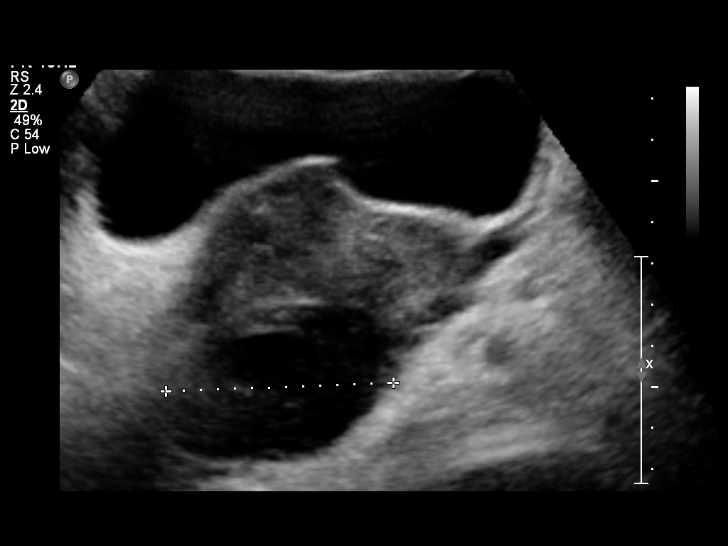
[im 27/92]
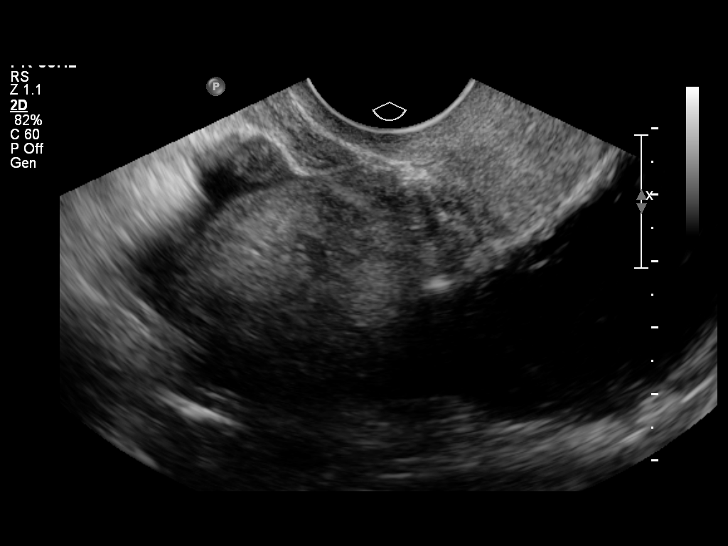
[im 35/92]
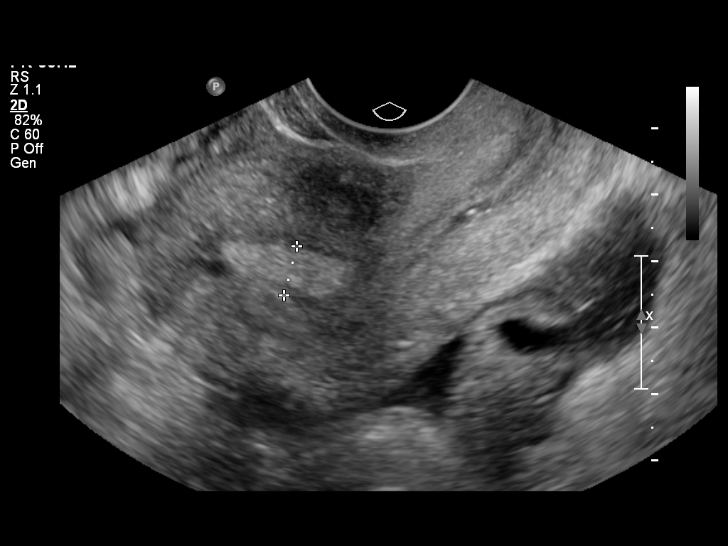
[im 42/92]
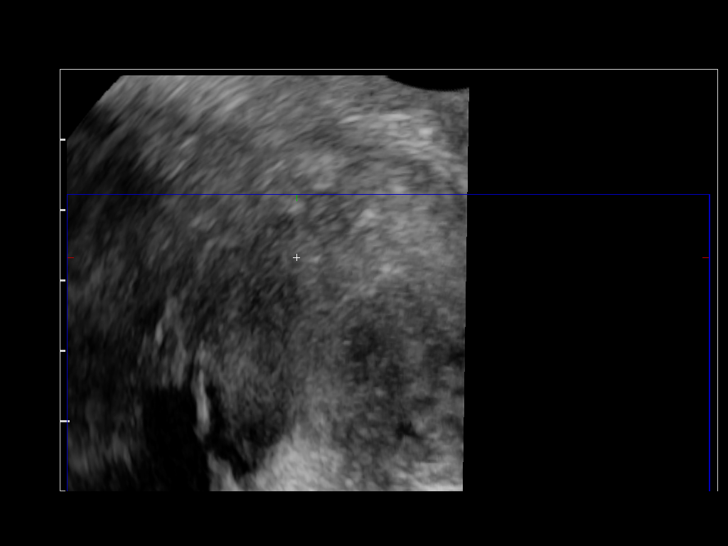
[im 50/92]
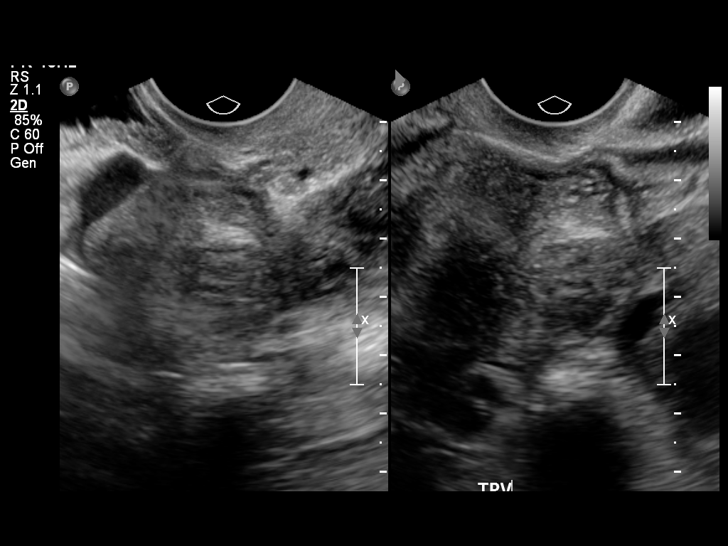
[im 57/92]
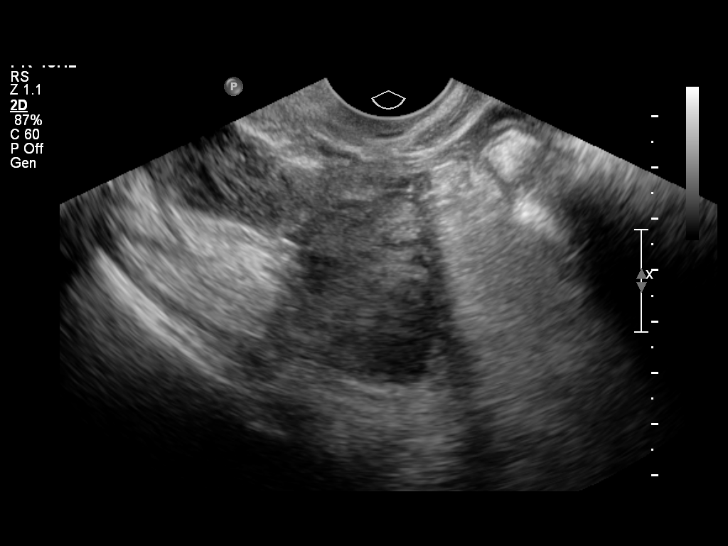
[im 65/92]
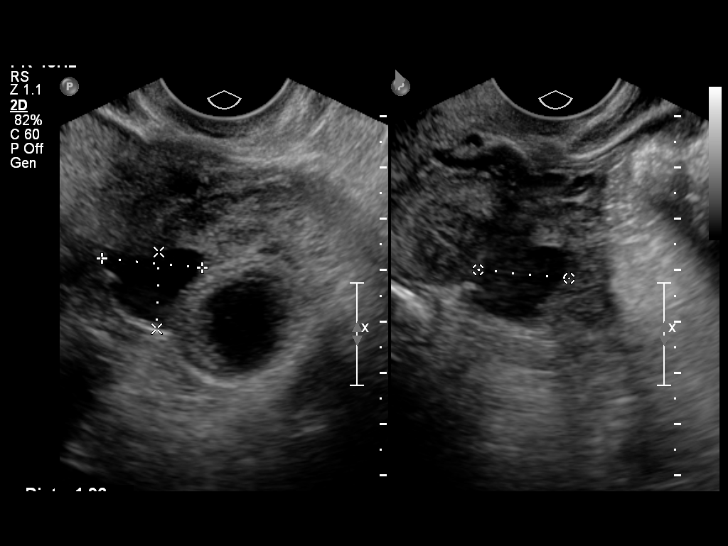
[im 73/92]
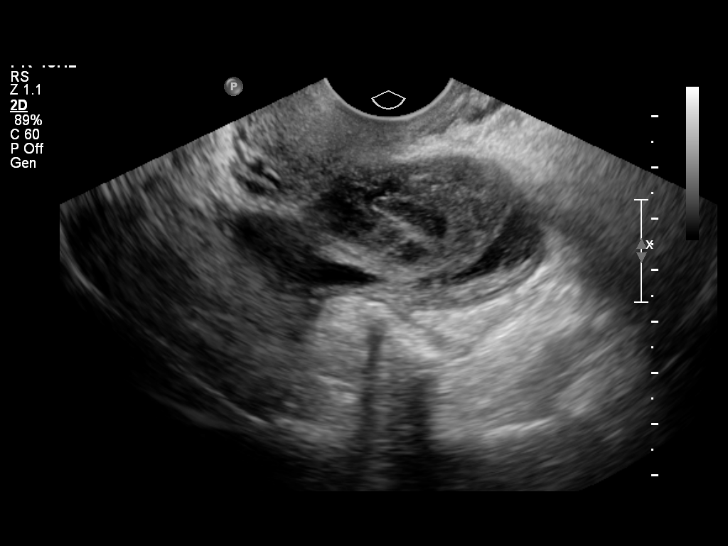
[im 80/92]
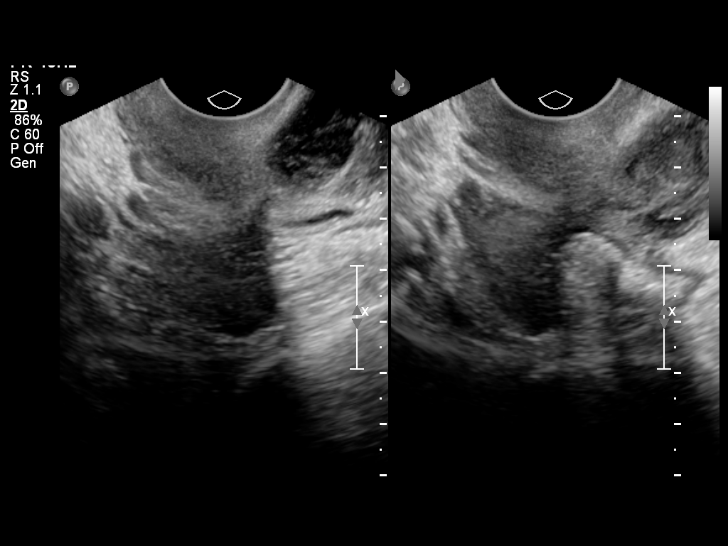
[im 88/92]
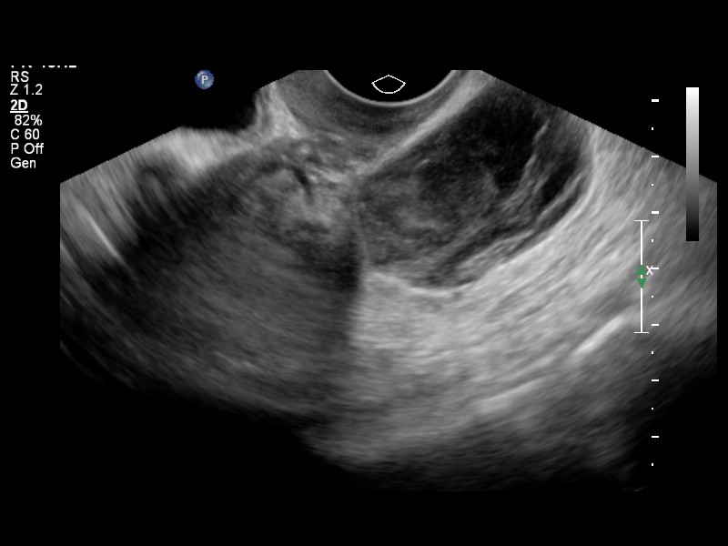

[12 of 25 positions shown; findings below may reference images not displayed]

It was necessary to proceed with endovaginal exam following the
transabdomnial exam to visualize the myometrium, endometrium and
ovaries.
FINDINGS: Uterus: demonstrates a sagittal length of 8.1 cm, AP depth of
cm and a transverse width 5.0 cm.  A small focal subserosal fibroid
is identified emanating off of the anterior lower uterine segment
measuring 1.1 x 1.1 x 1.1 cm.  A focal mural fibroid is seen in the
left lower uterine segment measuring 1.8 x 1.7 x 2.0 cm and in the
posterior midbody with a partial subserosal component measuring
x 1.0 x 1.0 cm. One small subendometrial mural cyst is identified
and raises the possibility of underlying adenomyosis

Endometrium: Is homogeneously echogenic with an AP width of 7.6 mm.
No areas of focal thickening or heterogeneity are seen and this
would correlate with a presecretory endometrial stripe and
correspond with the patient's given LMP of 11/06/2010

Right ovary:  Measures 3.6 x 2.2 x 2.0 cm and has a normal
appearance

Left ovary: Measures 4.0 x 4.1 x 3.1 cm and contains a corpus
luteum

Other findings: Positioned between the ovaries and contiguous with
the ovaries is a complex mass demonstrating posterior acoustical
enhancement and no definite intralesional flow with color Doppler
exam measuring 5.2 x 3.1 x 5.5 cm.  No significant circumferential
flow is noted and this would correlate in position with the
previously noted soft tissue density on prior recent CT. A small
amount of free fluid is noted in the cul-de-sac
IMPRESSION: Fibroid uterus with fibroid sizes and locations as noted above.
Small subendometrial mural cyst suggests the possibility of
associated underlying adenomyosis although no other sonographic
features to suggest this are present.

Normal ovaries with a complex avascular mass identified between the
ovaries in the cul-de-sac.  Increased through transmission suggests
a cystic nature to this finding. Measurements today suggest this is
decreased slightly in size since the previous CT. The appearance
raises the possibility of a pelvic hematoma and given the history
of CT performed for pain in the periovulatory phase of the cycle
combined with history of Coumadin raises the possibility of a
ruptured ovarian cyst at the time of CT with subsequent development
of a hematoma.  As the sonographic appearance is not diagnostic,
further assessment with MRI is recommended to evaluate for blood
products and exclude worrisome features. If no worrisome features
are seen, this finding could be followed to assess for resolution
as would be expected with a hematoma. No history of associated
fever malignancy is given and this combined with lack of lesional
flow would make a pelvic abscess or drop metastatic focus less
likely

I discussed these results and recommendation with Dr. Blain at

## 2011-07-19 ENCOUNTER — Ambulatory Visit (INDEPENDENT_AMBULATORY_CARE_PROVIDER_SITE_OTHER): Payer: BC Managed Care – PPO | Admitting: *Deleted

## 2011-07-19 DIAGNOSIS — I4729 Other ventricular tachycardia: Secondary | ICD-10-CM

## 2011-07-19 DIAGNOSIS — Z95810 Presence of automatic (implantable) cardiac defibrillator: Secondary | ICD-10-CM

## 2011-07-19 DIAGNOSIS — I421 Obstructive hypertrophic cardiomyopathy: Secondary | ICD-10-CM

## 2011-07-19 DIAGNOSIS — I472 Ventricular tachycardia: Secondary | ICD-10-CM

## 2011-07-20 ENCOUNTER — Encounter: Payer: Self-pay | Admitting: Internal Medicine

## 2011-07-20 LAB — REMOTE ICD DEVICE
ATRIAL PACING ICD: 99 pct
BATTERY VOLTAGE: 2.59 V
DEVICE MODEL ICD: 523975
HV IMPEDENCE: 66 Ohm
RV LEAD IMPEDENCE ICD: 360 Ohm
VENTRICULAR PACING ICD: 0 pct

## 2011-07-24 ENCOUNTER — Telehealth: Payer: Self-pay | Admitting: Internal Medicine

## 2011-07-24 DIAGNOSIS — D649 Anemia, unspecified: Secondary | ICD-10-CM

## 2011-07-24 MED ORDER — FERROUS GLUCONATE 240 (27 FE) MG PO TABS: 240.0000 mg | ORAL_TABLET | Freq: Two times a day (BID) | ORAL | Status: DC

## 2011-07-24 NOTE — Assessment & Plan Note (Signed)
Hemoglobin  Date Value Range Status  07/09/2011 9.7* 12.0-15.0 (g/dL) Final  05/30/2011 9.0* 12.0-15.0 (g/dL) Final     Ferritin  Date Value Range Status  05/01/2011 37  10-291 (ng/mL) Final  12/22/2008 17  10-291 (ng/mL) Final    I spoke with patient by phone about recent lab work.  Her hemoglobin was 9.7 on 1/28, improved somewhat but still low, and she reports often feeling tired.  She has had no symptoms to suggest recurrent GI bleeding.  She tried an oral iron supplement but it upset her stomach.  Plan is to recheck CBC and ferritin on 2/18 when she sees Dr. Groce, and start ferrous gluconate which hopefully will be better tolerated.  Will also schedule a follow up appointment in my clinic on 3/20.  

## 2011-07-24 NOTE — Telephone Encounter (Signed)
Hemoglobin  Date Value Range Status  07/09/2011 9.7* 12.0-15.0 (g/dL) Final  16/03/9603 9.0* 12.0-15.0 (g/dL) Final     Ferritin  Date Value Range Status  05/01/2011 37  10-291 (ng/mL) Final  12/22/2008 17  10-291 (ng/mL) Final    I spoke with patient by phone about recent lab work.  Her hemoglobin was 9.7 on 1/28, improved somewhat but still low, and she reports often feeling tired.  She has had no symptoms to suggest recurrent GI bleeding.  She tried an oral iron supplement but it upset her stomach.  Plan is to recheck CBC and ferritin on 2/18 when she sees Dr. Alexandria Lodge, and start ferrous gluconate which hopefully will be better tolerated.  Will also schedule a follow up appointment in my clinic on 3/20.

## 2011-07-25 ENCOUNTER — Encounter: Payer: BC Managed Care – PPO | Admitting: *Deleted

## 2011-07-27 NOTE — Progress Notes (Signed)
ICD remote 

## 2011-07-30 ENCOUNTER — Ambulatory Visit (INDEPENDENT_AMBULATORY_CARE_PROVIDER_SITE_OTHER): Payer: BC Managed Care – PPO | Admitting: Pharmacist

## 2011-07-30 DIAGNOSIS — Z7901 Long term (current) use of anticoagulants: Secondary | ICD-10-CM

## 2011-07-30 DIAGNOSIS — D649 Anemia, unspecified: Secondary | ICD-10-CM

## 2011-07-30 LAB — CBC WITH DIFFERENTIAL/PLATELET
Basophils Absolute: 0 10*3/uL (ref 0.0–0.1)
Basophils Relative: 0 % (ref 0–1)
Eosinophils Relative: 1 % (ref 0–5)
HCT: 35.5 % — ABNORMAL LOW (ref 36.0–46.0)
Lymphocytes Relative: 15 % (ref 12–46)
MCHC: 30.1 g/dL (ref 30.0–36.0)
MCV: 83.7 fL (ref 78.0–100.0)
Monocytes Absolute: 0.9 10*3/uL (ref 0.1–1.0)
RDW: 17.5 % — ABNORMAL HIGH (ref 11.5–15.5)

## 2011-07-30 LAB — POCT INR: INR: 2.6

## 2011-07-30 NOTE — Progress Notes (Signed)
Anti-Coagulation Progress Note  [name] is a 46 y.o. female who is currently on an anti-coagulation regimen.    RECENT RESULTS: Recent results are below, the most recent result is correlated with a dose of 28 mg. per week: Lab Results  Component Value Date   INR 2.60 07/30/2011   INR 3.30 07/09/2011   INR 5.40 06/18/2011    ANTI-COAG DOSE:   Latest dosing instructions   Total Sun Mon Tue Wed Thu Fri Sat   28 4 mg 4 mg 4 mg 4 mg 4 mg 4 mg 4 mg    (4 mg1) (4 mg1) (4 mg1) (4 mg1) (4 mg1) (4 mg1) (4 mg1)         ANTICOAG SUMMARY: Anticoagulation Episode Summary              Current INR goal 2.0-3.0 Next INR check 08/27/2011   INR from last check 2.60 (07/30/2011)     Weekly max dose (mg)  Target end date Indefinite   Indications Long-term (current) use of anticoagulants, A-fib (Resolved)   [facility]R check location [facility]    Send INR reminders to [facility]AG [facility]   Comments        Provider Role Specialty Phone number   Farley Ly, MD  Internal Medicine [phone]        ANTICOAG TODAY: Anticoagulation Summary as of 07/30/2011              INR goal 2.0-3.0     Selected INR 2.60 (07/30/2011) Next INR check 08/27/2011   Weekly max dose (mg)  Target end date Indefinite   Indications Long-term (current) use of anticoagulants, A-fib (Resolved)    Anticoagulation Episode Summary              INR check location [facility] Preferred lab    Send INR reminders to [facility]   Comments        Provider Role Specialty Phone number   Farley Ly, MD  Internal Medicine [phone]        PATIENT INSTRUCTIONS: Patient Instructions  Patient instructed to take medications as defined in the Anti-coagulation Track section of this encounter.  Patient instructed to take today's dose.  Patient verbalized understanding of these instructions.        FOLLOW-UP Return in 4 weeks (on 08/27/2011) for Follow up INR.  Hulen Luster,  III Pharm.D., CACP

## 2011-07-30 NOTE — Patient Instructions (Signed)
Patient instructed to take medications as defined in the Anti-coagulation Track section of this encounter.  Patient instructed to take today's dose.  Patient verbalized understanding of these instructions.    

## 2011-07-31 LAB — IRON AND TIBC: UIBC: 400 ug/dL (ref 125–400)

## 2011-08-01 ENCOUNTER — Encounter: Payer: Self-pay | Admitting: *Deleted

## 2011-08-08 ENCOUNTER — Encounter: Payer: Self-pay | Admitting: *Deleted

## 2011-08-27 ENCOUNTER — Ambulatory Visit: Payer: BC Managed Care – PPO

## 2011-08-29 ENCOUNTER — Ambulatory Visit (INDEPENDENT_AMBULATORY_CARE_PROVIDER_SITE_OTHER): Payer: BC Managed Care – PPO | Admitting: Internal Medicine

## 2011-08-29 ENCOUNTER — Encounter: Payer: Self-pay | Admitting: Internal Medicine

## 2011-08-29 ENCOUNTER — Ambulatory Visit: Payer: BC Managed Care – PPO | Admitting: Internal Medicine

## 2011-08-29 ENCOUNTER — Ambulatory Visit (INDEPENDENT_AMBULATORY_CARE_PROVIDER_SITE_OTHER): Payer: BC Managed Care – PPO | Admitting: Pharmacist

## 2011-08-29 VITALS — BP 105/70 | HR 66 | Temp 97.9°F | Ht 65.0 in | Wt 179.7 lb

## 2011-08-29 DIAGNOSIS — I4892 Unspecified atrial flutter: Secondary | ICD-10-CM

## 2011-08-29 DIAGNOSIS — D649 Anemia, unspecified: Secondary | ICD-10-CM

## 2011-08-29 DIAGNOSIS — Z7901 Long term (current) use of anticoagulants: Secondary | ICD-10-CM

## 2011-08-29 DIAGNOSIS — K219 Gastro-esophageal reflux disease without esophagitis: Secondary | ICD-10-CM

## 2011-08-29 DIAGNOSIS — Z79899 Other long term (current) drug therapy: Secondary | ICD-10-CM

## 2011-08-29 DIAGNOSIS — D509 Iron deficiency anemia, unspecified: Secondary | ICD-10-CM

## 2011-08-29 DIAGNOSIS — I498 Other specified cardiac arrhythmias: Secondary | ICD-10-CM

## 2011-08-29 LAB — CBC WITH DIFFERENTIAL/PLATELET
Basophils Relative: 0 % (ref 0–1)
Eosinophils Absolute: 0.1 10*3/uL (ref 0.0–0.7)
Eosinophils Relative: 2 % (ref 0–5)
HCT: 39.8 % (ref 36.0–46.0)
Hemoglobin: 11.9 g/dL — ABNORMAL LOW (ref 12.0–15.0)
MCH: 26.7 pg (ref 26.0–34.0)
MCHC: 29.9 g/dL — ABNORMAL LOW (ref 30.0–36.0)
MCV: 89.2 fL (ref 78.0–100.0)
Monocytes Absolute: 0.6 10*3/uL (ref 0.1–1.0)
Monocytes Relative: 9 % (ref 3–12)

## 2011-08-29 LAB — COMPLETE METABOLIC PANEL WITH GFR
ALT: 13 U/L (ref 0–35)
Alkaline Phosphatase: 78 U/L (ref 39–117)
CO2: 25 mEq/L (ref 19–32)
GFR, Est African American: >89 mL/min
Sodium: 143 mEq/L (ref 135–145)
Total Bilirubin: 0.5 mg/dL (ref 0.3–1.2)
Total Protein: 6.1 g/dL (ref 6.0–8.3)

## 2011-08-29 NOTE — Patient Instructions (Signed)
Please continue current medications.   Please follow up with your cardiologists as planned. Please follow up with the anticoagulation clinic as planned.

## 2011-08-29 NOTE — Progress Notes (Signed)
  Subjective:    Patient ID: [name], female    DOB: 12/24/1965, 46 y.o.   MRN: [id number]  HPI Patient presents for followup of her iron deficiency anemia, hypertrophic obstructive cardiomyopathy, GERD, and other medical problems.  She is doing well without any acute complaints.  She reports that following her last cardiac procedure at [facility], she is only having about one episode of palpitations weekly and these last for only a few seconds.  She is taking the ferrous gluconate as prescribed without any apparent side effects.  She has been off of medication for GERD for several months, and reports no symptoms.   Review of Systems  Respiratory: Negative for chest tightness and shortness of breath.   Cardiovascular: Negative for chest pain and leg swelling.  Gastrointestinal: Negative for nausea, vomiting, abdominal pain and blood in stool.       Objective:   Physical Exam  Constitutional: No distress.  Cardiovascular: Normal rate and regular rhythm.   Murmur heard.  Systolic murmur is present with a grade of 2/6  Pulmonary/Chest: Effort normal and breath sounds normal. She has no wheezes. She has no rales.  Musculoskeletal: She exhibits no edema.        Assessment & Plan:

## 2011-08-29 NOTE — Assessment & Plan Note (Signed)
Assessment: Patient is doing well following a convergent procedure done at [facility] in September of 2012.  She reports only brief episodes of palpitations, which occur on average about once a week and which resolve within a few seconds.  Plan: Patient will followup with her local cardiologist Dr. Graciela Husbands and with her cardiologist at [facility] as scheduled.  She will continue current medications.

## 2011-08-29 NOTE — Patient Instructions (Signed)
Patient instructed to take medications as defined in the Anti-coagulation Track section of this encounter.  Patient instructed to take today's dose.  Patient verbalized understanding of these instructions.    

## 2011-08-29 NOTE — Assessment & Plan Note (Signed)
Assessment: Patient has no symptoms, and has been off of a proton pump inhibitor for several months.  Plan: Continue current management.

## 2011-08-29 NOTE — Progress Notes (Signed)
Anti-Coagulation Progress Note  [name] is a 46 y.o. female who is currently on an anti-coagulation regimen.    RECENT RESULTS: Recent results are below, the most recent result is correlated with a dose of 28 mg. per week: Lab Results  Component Value Date   INR 2.0 08/29/2011   INR 2.60 07/30/2011   INR 3.30 07/09/2011    ANTI-COAG DOSE:   Latest dosing instructions   Total Sun Mon Tue Wed Thu Fri Sat   30 4 mg 4 mg 4 mg 6 mg 4 mg 4 mg 4 mg    (4 mg1) (4 mg1) (4 mg1) (4 mg1.5) (4 mg1) (4 mg1) (4 mg1)         ANTICOAG SUMMARY: Anticoagulation Episode Summary              Current INR goal 2.0-3.0 Next INR check 09/24/2011   INR from last check 2.0 (08/29/2011)     Weekly max dose (mg)  Target end date Indefinite   Indications Long-term (current) use of anticoagulants, A-fib (Resolved)   [facility] check location [facility]    Send INR reminders to [facility]   Comments        Provider Role Specialty Phone number   Farley Ly, MD  Internal Medicine [phone]        ANTICOAG TODAY: Anticoagulation Summary as of 08/29/2011              INR goal 2.0-3.0     Selected INR 2.0 (08/29/2011) Next INR check 09/24/2011   Weekly max dose (mg)  Target end date Indefinite   Indications Long-term (current) use of anticoagulants, A-fib (Resolved)    Anticoagulation Episode Summary              INR check location [facility] Preferred lab    Send INR reminders to [facility]AG [facility]   Comments        Provider Role Specialty Phone number   Farley Ly, MD  Internal Medicine [phone]        PATIENT INSTRUCTIONS: Patient Instructions  Patient instructed to take medications as defined in the Anti-coagulation Track section of this encounter.  Patient instructed to take today's dose.  Patient verbalized understanding of these instructions.        FOLLOW-UP Return in 4 weeks (on 09/24/2011) for Follow up INR.  Hulen Luster,  III Pharm.D., CACP

## 2011-08-29 NOTE — Assessment & Plan Note (Signed)
Hemoglobin  Date Value Range Status  07/30/2011 10.7* 12.0-15.0 (g/dL) Final  7/82/9562 9.7* 12.0-15.0 (g/dL) Final  13/01/6577 9.0* 12.0-15.0 (g/dL) Final     Ferritin  Date Value Range Status  07/30/2011 14  10-291 (ng/mL) Final  05/01/2011 37  10-291 (ng/mL) Final     Assessment: Patient has no symptoms of anemia, and none to suggest ongoing GI blood loss.  Her last hemoglobin had improved to 10.7.  She reports that she is taking her iron supplement without side effects.  Plan: Continue ferrous gluconate at current dose; check a CBC today.

## 2011-09-24 ENCOUNTER — Ambulatory Visit (INDEPENDENT_AMBULATORY_CARE_PROVIDER_SITE_OTHER): Payer: BC Managed Care – PPO | Admitting: Pharmacist

## 2011-09-24 DIAGNOSIS — D649 Anemia, unspecified: Secondary | ICD-10-CM

## 2011-09-24 DIAGNOSIS — Z7901 Long term (current) use of anticoagulants: Secondary | ICD-10-CM

## 2011-09-24 LAB — POCT INR: INR: 2.6

## 2011-09-24 NOTE — Patient Instructions (Signed)
Patient instructed to take medications as defined in the Anti-coagulation Track section of this encounter.  Patient instructed to take today's dose.  Patient verbalized understanding of these instructions.    

## 2011-09-24 NOTE — Progress Notes (Signed)
Anti-Coagulation Progress Note  [name] is a 46 y.o. female who is currently on an anti-coagulation regimen.    RECENT RESULTS: Recent results are below, the most recent result is correlated with a dose of 30 mg. per week: Lab Results  Component Value Date   INR 2.60 09/24/2011   INR 2.0 08/29/2011   INR 2.60 07/30/2011    ANTI-COAG DOSE:   Latest dosing instructions   Total Sun Mon Tue Wed Thu Fri Sat   30 4 mg 4 mg 4 mg 6 mg 4 mg 4 mg 4 mg    (4 mg1) (4 mg1) (4 mg1) (4 mg1.5) (4 mg1) (4 mg1) (4 mg1)         ANTICOAG SUMMARY: Anticoagulation Episode Summary              Current INR goal 2.0-3.0 Next INR check 10/22/2011   INR from last check 2.60 (09/24/2011)     Weekly max dose (mg)  Target end date Indefinite   Indications Long-term (current) use of anticoagulants, A-fib (Resolved)   [facility] check location [facility] Preferred [facility]    Send INR reminders to [facility]   Comments        Provider Role Specialty Phone number   Farley Ly, MD  Internal Medicine [phone]        ANTICOAG TODAY: Anticoagulation Summary as of 09/24/2011              INR goal 2.0-3.0     Selected INR 2.60 (09/24/2011) Next INR check 10/22/2011   Weekly max dose (mg)  Target end date Indefinite   Indications Long-term (current) use of anticoagulants, A-fib (Resolved)    Anticoagulation Episode Summary              INR check location [facility] Preferred lab    Send INR reminders to [facility]   Comments        Provider Role Specialty Phone number   Farley Ly, MD  Internal Medicine [phone]        PATIENT INSTRUCTIONS: Patient Instructions  Patient instructed to take medications as defined in the Anti-coagulation Track section of this encounter.  Patient instructed to take today's dose.  Patient verbalized understanding of these instructions.        FOLLOW-UP Return in 4 weeks (on 10/22/2011) for Follow up INR.  Hulen Luster,  III Pharm.D., CACP

## 2011-10-18 ENCOUNTER — Encounter: Payer: BC Managed Care – PPO | Admitting: *Deleted

## 2011-10-22 ENCOUNTER — Ambulatory Visit (INDEPENDENT_AMBULATORY_CARE_PROVIDER_SITE_OTHER): Payer: BC Managed Care – PPO | Admitting: Pharmacist

## 2011-10-22 DIAGNOSIS — Z7901 Long term (current) use of anticoagulants: Secondary | ICD-10-CM

## 2011-10-22 DIAGNOSIS — D649 Anemia, unspecified: Secondary | ICD-10-CM

## 2011-10-22 NOTE — Progress Notes (Signed)
Anti-Coagulation Progress Note  [name] is a 46 y.o. female who is currently on an anti-coagulation regimen.    RECENT RESULTS: Recent results are below, the most recent result is correlated with a dose of 30 mg. per week: Lab Results  Component Value Date   INR 2.40 10/22/2011   INR 2.60 09/24/2011   INR 2.0 08/29/2011    ANTI-COAG DOSE:   Latest dosing instructions   Total Sun Mon Tue Wed Thu Fri Sat   30 4 mg 4 mg 4 mg 6 mg 4 mg 4 mg 4 mg    (4 mg1) (4 mg1) (4 mg1) (4 mg1.5) (4 mg1) (4 mg1) (4 mg1)         ANTICOAG SUMMARY: Anticoagulation Episode Summary              Current INR goal 2.0-3.0 Next INR check 11/26/2011   INR from last check 2.40 (10/22/2011)     Weekly max dose (mg)  Target end date Indefinite   Indications Long-term (current) use of anticoagulants, A-fib (Resolved)   [facility]R check location [facility]    Send INR reminders to ANTICOAG [facility]   Comments        Provider Role Specialty Phone number   Farley Ly, MD  Internal Medicine [phone]        ANTICOAG TODAY: Anticoagulation Summary as of 10/22/2011              INR goal 2.0-3.0     Selected INR 2.40 (10/22/2011) Next INR check 11/26/2011   Weekly max dose (mg)  Target end date Indefinite   Indications Long-term (current) use of anticoagulants, A-fib (Resolved)    Anticoagulation Episode Summary              INR check location [facility] Preferred lab    Send INR reminders to ANTICOAG [facility]   Comments        Provider Role Specialty Phone number   Farley Ly, MD  Internal Medicine [phone]        PATIENT INSTRUCTIONS: Patient Instructions  Patient instructed to take medications as defined in the Anti-coagulation Track section of this encounter.  Patient instructed to take today's dose.  Patient verbalized understanding of these instructions.        FOLLOW-UP Return in 5 weeks (on 11/26/2011) for Follow up INR.  Hulen Luster,  III Pharm.D., CACP

## 2011-10-22 NOTE — Patient Instructions (Signed)
Patient instructed to take medications as defined in the Anti-coagulation Track section of this encounter.  Patient instructed to take today's dose.  Patient verbalized understanding of these instructions.    

## 2011-10-26 ENCOUNTER — Other Ambulatory Visit: Payer: Self-pay | Admitting: Internal Medicine

## 2011-10-26 ENCOUNTER — Encounter: Payer: Self-pay | Admitting: *Deleted

## 2011-11-13 ENCOUNTER — Other Ambulatory Visit: Payer: Self-pay | Admitting: Internal Medicine

## 2011-11-26 ENCOUNTER — Ambulatory Visit (INDEPENDENT_AMBULATORY_CARE_PROVIDER_SITE_OTHER): Payer: BC Managed Care – PPO | Admitting: Pharmacist

## 2011-11-26 DIAGNOSIS — Z7901 Long term (current) use of anticoagulants: Secondary | ICD-10-CM

## 2011-11-26 DIAGNOSIS — D649 Anemia, unspecified: Secondary | ICD-10-CM

## 2011-11-26 NOTE — Patient Instructions (Signed)
Patient instructed to take medications as defined in the Anti-coagulation Track section of this encounter.  Patient instructed to take today's dose.  Patient verbalized understanding of these instructions.    

## 2011-11-26 NOTE — Progress Notes (Signed)
Anti-Coagulation Progress Note  [name] is a 46 y.o. female who is currently on an anti-coagulation regimen.    RECENT RESULTS: Recent results are below, the most recent result is correlated with a dose of 30 mg. per week: Lab Results  Component Value Date   INR 2.60 11/26/2011   INR 2.40 10/22/2011   INR 2.60 09/24/2011    ANTI-COAG DOSE:   Latest dosing instructions   Total Sun Mon Tue Wed Thu Fri Sat   30 4 mg 4 mg 4 mg 6 mg 4 mg 4 mg 4 mg    (4 mg1) (4 mg1) (4 mg1) (4 mg1.5) (4 mg1) (4 mg1) (4 mg1)         ANTICOAG SUMMARY: Anticoagulation Episode Summary              Current INR goal 2.0-3.0 Next INR check 12/24/2011   INR from last check 2.60 (11/26/2011)     Weekly max dose (mg)  Target end date Indefinite   Indications Long-term (current) use of anticoagulants, A-fib (Resolved)   INR check location [facility] Preferred lab    Send INR reminders to ANTICO[facility]   Comments        Provider Role Specialty Phone number   Farley Ly, MD  Internal Medicine [phone]        ANTICOAG TODAY: Anticoagulation Summary as of 11/26/2011              INR goal 2.0-3.0     Selected INR 2.60 (11/26/2011) Next INR check 12/24/2011   Weekly max dose (mg)  Target end date Indefinite   Indications Long-term (current) use of anticoagulants, A-fib (Resolved)    Anticoagulation Episode Summary              INR check location [facility] Preferred lab    Send INR reminders to ANTICOAG [facility]   Comments        Provider Role Specialty Phone number   Farley Ly, MD  Internal Medicine [phone]        PATIENT INSTRUCTIONS: Patient Instructions  Patient instructed to take medications as defined in the Anti-coagulation Track section of this encounter.  Patient instructed to take today's dose.  Patient verbalized understanding of these instructions.        FOLLOW-UP Return in 4 weeks (on 12/24/2011) for Follow up INR at 3:30PM.  Hulen Luster,  III Pharm.D., CACP

## 2011-11-29 ENCOUNTER — Ambulatory Visit (INDEPENDENT_AMBULATORY_CARE_PROVIDER_SITE_OTHER): Payer: BC Managed Care – PPO | Admitting: *Deleted

## 2011-11-29 ENCOUNTER — Encounter: Payer: Self-pay | Admitting: Internal Medicine

## 2011-11-29 DIAGNOSIS — Z95810 Presence of automatic (implantable) cardiac defibrillator: Secondary | ICD-10-CM

## 2011-12-04 LAB — REMOTE ICD DEVICE
ATRIAL PACING ICD: 57 pct
BAMS-0001: 150 {beats}/min
BATTERY VOLTAGE: 2.57 V
DEV-0020ICD: NEGATIVE
DEVICE MODEL ICD: 523975
RV LEAD AMPLITUDE: 11.7 mv
VENTRICULAR PACING ICD: 7.1 pct

## 2011-12-21 ENCOUNTER — Encounter: Payer: Self-pay | Admitting: *Deleted

## 2011-12-24 ENCOUNTER — Ambulatory Visit (INDEPENDENT_AMBULATORY_CARE_PROVIDER_SITE_OTHER): Payer: BC Managed Care – PPO | Admitting: Pharmacist

## 2011-12-24 DIAGNOSIS — Z7901 Long term (current) use of anticoagulants: Secondary | ICD-10-CM

## 2011-12-24 DIAGNOSIS — D649 Anemia, unspecified: Secondary | ICD-10-CM

## 2011-12-24 LAB — POCT INR: INR: 3.1

## 2011-12-24 NOTE — Patient Instructions (Signed)
Patient instructed to take medications as defined in the Anti-coagulation Track section of this encounter.  Patient instructed to take today's dose.  Patient verbalized understanding of these instructions.    

## 2011-12-24 NOTE — Progress Notes (Signed)
Anti-Coagulation Progress Note  [name] is a 46 y.o. female who is currently on an anti-coagulation regimen.    RECENT RESULTS: Recent results are below, the most recent result is correlated with a dose of 30 mg. per week: Lab Results  Component Value Date   INR 3.10 12/24/2011   INR 2.60 11/26/2011   INR 2.40 10/22/2011    ANTI-COAG DOSE:   Latest dosing instructions   Total Sun Mon Tue Wed Thu Fri Sat   28 4 mg 4 mg 4 mg 4 mg 4 mg 4 mg 4 mg    (4 mg1) (4 mg1) (4 mg1) (4 mg1) (4 mg1) (4 mg1) (4 mg1)         ANTICOAG SUMMARY: Anticoagulation Episode Summary              Current INR goal 2.0-3.0 Next INR check 01/28/2012   INR from last check 3.10! (12/24/2011)     Weekly max dose (mg)  Target end date Indefinite   Indications Long-term (current) use of anticoagulants, A-fib (Resolved)   INR check location [facility] Preferred [facility]    Send INR reminders to ANT[facility]   Comments        Provider Role Specialty Phone number   Farley Ly, MD  Internal Medicine [phone]        ANTICOAG TODAY: Anticoagulation Summary as of 12/24/2011              INR goal 2.0-3.0     Selected INR 3.10! (12/24/2011) Next INR check 01/28/2012   Weekly max dose (mg)  Target end date Indefinite   Indications Long-term (current) use of anticoagulants, A-fib (Resolved)    Anticoagulation Episode Summary              INR check location [facility] Preferred lab    Send INR reminders to ANT[facility]   Comments        Provider Role Specialty Phone number   Farley Ly, MD  Internal Medicine [phone]        PATIENT INSTRUCTIONS: Patient Instructions  Patient instructed to take medications as defined in the Anti-coagulation Track section of this encounter.  Patient instructed to take today's dose.  Patient verbalized understanding of these instructions.        FOLLOW-UP Return in 5 weeks (on 01/28/2012) for Follow up INR at 4PM.  Hulen Luster,  III Pharm.D., CACP

## 2011-12-25 NOTE — Addendum Note (Signed)
Addended by: Bufford Spikes on: 12/25/2011 04:38 PM   Modules accepted: Orders

## 2012-01-20 ENCOUNTER — Other Ambulatory Visit: Payer: Self-pay | Admitting: Internal Medicine

## 2012-01-28 ENCOUNTER — Ambulatory Visit (INDEPENDENT_AMBULATORY_CARE_PROVIDER_SITE_OTHER): Payer: BC Managed Care – PPO | Admitting: Pharmacist

## 2012-01-28 DIAGNOSIS — D649 Anemia, unspecified: Secondary | ICD-10-CM

## 2012-01-28 DIAGNOSIS — Z7901 Long term (current) use of anticoagulants: Secondary | ICD-10-CM

## 2012-01-28 NOTE — Patient Instructions (Signed)
Patient instructed to take medications as defined in the Anti-coagulation Track section of this encounter.  Patient instructed to take today's dose.  Patient verbalized understanding of these instructions.    

## 2012-01-28 NOTE — Progress Notes (Signed)
Anti-Coagulation Progress Note  [name] is a 46 y.o. female who is currently on an anti-coagulation regimen.    RECENT RESULTS: Recent results are below, the most recent result is correlated with a dose of 28 mg. per week: Lab Results  Component Value Date   INR 2.90 01/28/2012   INR 3.10 12/24/2011   INR 2.60 11/26/2011    ANTI-COAG DOSE:   Latest dosing instructions   Total Sun Mon Tue Wed Thu Fri Sat   28 4 mg 4 mg 4 mg 4 mg 4 mg 4 mg 4 mg    (4 mg1) (4 mg1) (4 mg1) (4 mg1) (4 mg1) (4 mg1) (4 mg1)         ANTICOAG SUMMARY: Anticoagulation Episode Summary              Current INR goal 2.0-3.0 Next INR check 02/25/2012   INR from last check 2.90 (01/28/2012)     Weekly max dose (mg)  Target end date Indefinite   Indications Long-term (current) use of anticoagulants, A-fib (Resolved)   INR check location [facility] Preferred [facility]    Send INR reminders to ANT[facility]   Comments        Provider Role Specialty Phone number   Farley Ly, MD  Internal Medicine [phone]        ANTICOAG TODAY: Anticoagulation Summary as of 01/28/2012              INR goal 2.0-3.0     Selected INR 2.90 (01/28/2012) Next INR check 02/25/2012   Weekly max dose (mg)  Target end date Indefinite   Indications Long-term (current) use of anticoagulants, A-fib (Resolved)    Anticoagulation Episode Summary              INR check location [facility] Preferred [facility]    Send INR reminders to ANTICOAG [facility]   Comments        Provider Role Specialty Phone number   Farley Ly, MD  Internal Medicine [phone]        PATIENT INSTRUCTIONS: Patient Instructions  Patient instructed to take medications as defined in the Anti-coagulation Track section of this encounter.  Patient instructed to take today's dose.  Patient verbalized understanding of these instructions.        FOLLOW-UP Return in 4 weeks (on 02/25/2012) for Follow up INR at 4:30PM.  Hulen Luster,  III Pharm.D., CACP

## 2012-02-25 ENCOUNTER — Ambulatory Visit (INDEPENDENT_AMBULATORY_CARE_PROVIDER_SITE_OTHER): Payer: BC Managed Care – PPO | Admitting: Pharmacist

## 2012-02-25 DIAGNOSIS — Z7901 Long term (current) use of anticoagulants: Secondary | ICD-10-CM

## 2012-02-25 DIAGNOSIS — D649 Anemia, unspecified: Secondary | ICD-10-CM

## 2012-02-25 LAB — POCT INR: INR: 2.9

## 2012-02-25 NOTE — Patient Instructions (Signed)
Patient instructed to take medications as defined in the Anti-coagulation Track section of this encounter.  Patient instructed to take today's dose.  Patient verbalized understanding of these instructions.    

## 2012-02-25 NOTE — Progress Notes (Signed)
Anti-Coagulation Progress Note  [name] is a 46 y.o. female who is currently on an anti-coagulation regimen.    RECENT RESULTS: Recent results are below, the most recent result is correlated with a dose of 28 mg. per week: Lab Results  Component Value Date   INR 2.90 02/25/2012   INR 2.90 01/28/2012   INR 3.10 12/24/2011    ANTI-COAG DOSE:   Latest dosing instructions   Total Sun Mon Tue Wed Thu Fri Sat   28 4 mg 4 mg 4 mg 4 mg 4 mg 4 mg 4 mg    (4 mg1) (4 mg1) (4 mg1) (4 mg1) (4 mg1) (4 mg1) (4 mg1)         ANTICOAG SUMMARY: Anticoagulation Episode Summary              Current INR goal 2.0-3.0 Next INR check 03/24/2012   INR from last check 2.90 (02/25/2012)     Weekly max dose (mg)  Target end date Indefinite   Indications Long-term (current) use of anticoagulants, A-fib (Resolved)   INR check location [facility]    Send INR reminders to ANTICOAG [facility]   Comments        Provider Role Specialty Phone number   Farley Ly, MD  Internal Medicine [phone]        ANTICOAG TODAY: Anticoagulation Summary as of 02/25/2012              INR goal 2.0-3.0     Selected INR 2.90 (02/25/2012) Next INR check 03/24/2012   Weekly max dose (mg)  Target end date Indefinite   Indications Long-term (current) use of anticoagulants, A-fib (Resolved)    Anticoagulation Episode Summary              INR check location [facility]    Send INR reminders to ANTICO[facility]   Comments        Provider Role Specialty Phone number   Farley Ly, MD  Internal Medicine [phone]        PATIENT INSTRUCTIONS: Patient Instructions  Patient instructed to take medications as defined in the Anti-coagulation Track section of this encounter.  Patient instructed to take today's dose.  Patient verbalized understanding of these instructions.        FOLLOW-UP Return in 4 weeks (on 03/24/2012) for Follow up INR at 16:30h.  Hulen Luster, III Pharm.D., CACP

## 2012-03-03 ENCOUNTER — Encounter: Payer: BC Managed Care – PPO | Admitting: *Deleted

## 2012-03-06 ENCOUNTER — Encounter: Payer: Self-pay | Admitting: *Deleted

## 2012-03-24 ENCOUNTER — Ambulatory Visit: Payer: BC Managed Care – PPO

## 2012-03-31 ENCOUNTER — Ambulatory Visit: Payer: BC Managed Care – PPO

## 2012-04-07 ENCOUNTER — Ambulatory Visit: Payer: BC Managed Care – PPO

## 2012-04-07 ENCOUNTER — Ambulatory Visit (INDEPENDENT_AMBULATORY_CARE_PROVIDER_SITE_OTHER): Payer: BC Managed Care – PPO | Admitting: Pharmacist

## 2012-04-07 DIAGNOSIS — D649 Anemia, unspecified: Secondary | ICD-10-CM

## 2012-04-07 DIAGNOSIS — Z7901 Long term (current) use of anticoagulants: Secondary | ICD-10-CM

## 2012-04-07 NOTE — Patient Instructions (Signed)
Patient instructed to take medications as defined in the Anti-coagulation Track section of this encounter.  Patient instructed to take today's dose.  Patient verbalized understanding of these instructions.    

## 2012-04-07 NOTE — Progress Notes (Signed)
Anti-Coagulation Progress Note  [name] is a 46 y.o. female who is currently on an anti-coagulation regimen.    RECENT RESULTS: Recent results are below, the most recent result is correlated with a dose of 28 mg. per week: Lab Results  Component Value Date   INR 2.70 04/07/2012   INR 2.7 04/07/2012   INR 2.90 02/25/2012    ANTI-COAG DOSE:   Latest dosing instructions   Total Sun Mon Tue Wed Thu Fri Sat   28 4 mg 4 mg 4 mg 4 mg 4 mg 4 mg 4 mg    (4 mg1) (4 mg1) (4 mg1) (4 mg1) (4 mg1) (4 mg1) (4 mg1)         ANTICOAG SUMMARY: Anticoagulation Episode Summary              Current INR goal 2.0-3.0 Next INR check 05/05/2012   INR from last check 2.70 (04/07/2012)     Weekly max dose (mg)  Target end date Indefinite   Indications Long-term (current) use of anticoagulants, A-fib (Resolved)   INR check location [facility]    Send INR reminders to [facility]   Comments        Provider Role Specialty Phone number   Farley Ly, MD  Internal Medicine [phone]        ANTICOAG TODAY: Anticoagulation Summary as of 04/07/2012              INR goal 2.0-3.0     Selected INR 2.70 (04/07/2012) Next INR check 05/05/2012   Weekly max dose (mg)  Target end date Indefinite   Indications Long-term (current) use of anticoagulants, A-fib (Resolved)    Anticoagulation Episode Summary              INR check location [facility]    Send INR reminders to ANT[facility]   Comments        Provider Role Specialty Phone number   Farley Ly, MD  Internal Medicine [phone]        PATIENT INSTRUCTIONS: Patient Instructions  Patient instructed to take medications as defined in the Anti-coagulation Track section of this encounter.  Patient instructed to take today's dose.  Patient verbalized understanding of these instructions.        FOLLOW-UP Return in 4 weeks (on 05/05/2012) for Follow up INR at 4PM.  Hulen Luster, III Pharm.D., CACP

## 2012-04-27 ENCOUNTER — Other Ambulatory Visit: Payer: Self-pay | Admitting: Internal Medicine

## 2012-05-05 ENCOUNTER — Ambulatory Visit (INDEPENDENT_AMBULATORY_CARE_PROVIDER_SITE_OTHER): Payer: BC Managed Care – PPO | Admitting: Pharmacist

## 2012-05-05 DIAGNOSIS — Z7901 Long term (current) use of anticoagulants: Secondary | ICD-10-CM

## 2012-05-05 DIAGNOSIS — D649 Anemia, unspecified: Secondary | ICD-10-CM

## 2012-05-05 NOTE — Progress Notes (Signed)
Anti-Coagulation Progress Note  [name] is a 46 y.o. female who is currently on an anti-coagulation regimen.    RECENT RESULTS: Recent results are below, the most recent result is correlated with a dose of 28 mg. per week: Lab Results  Component Value Date   INR 2.0 05/05/2012   INR 2.70 04/07/2012   INR 2.7 04/07/2012    ANTI-COAG DOSE:   Latest dosing instructions   Total Sun Mon Tue Wed Thu Fri Sat   32 4 mg 6 mg 4 mg 4 mg 6 mg 4 mg 4 mg    (4 mg1) (4 mg1.5) (4 mg1) (4 mg1) (4 mg1.5) (4 mg1) (4 mg1)         ANTICOAG SUMMARY: Anticoagulation Episode Summary              Current INR goal 2.0-3.0 Next INR check 06/02/2012   INR from last check 2.0 (05/05/2012)     Weekly max dose (mg)  Target end date Indefinite   Indications Long-term (current) use of anticoagulants [V58.61], A-fib (Resolved) [427.31]   INR check location [facility]    Send INR reminders to ANTICOAG [facility]   Comments        Provider Role Specialty Phone number   Farley Ly, MD  Internal Medicine [phone]        ANTICOAG TODAY: Anticoagulation Summary as of 05/05/2012              INR goal 2.0-3.0     Selected INR 2.0 (05/05/2012) Next INR check 06/02/2012   Weekly max dose (mg)  Target end date Indefinite   Indications Long-term (current) use of anticoagulants [V58.61], A-fib (Resolved) [427.31]    Anticoagulation Episode Summary              INR check location [facility]    Send INR reminders to [facility]   Comments        Provider Role Specialty Phone number   Farley Ly, MD  Internal Medicine [phone]        PATIENT INSTRUCTIONS: Patient Instructions  Patient instructed to take medications as defined in the Anti-coagulation Track section of this encounter.  Patient instructed to take today's dose.  Patient verbalized understanding of these instructions.        FOLLOW-UP Return in 4 weeks (on 06/02/2012)  for Follow up INR at 4:30PM.  Hulen Luster, III Pharm.D., CACP

## 2012-05-05 NOTE — Patient Instructions (Signed)
Patient instructed to take medications as defined in the Anti-coagulation Track section of this encounter.  Patient instructed to take today's dose.  Patient verbalized understanding of these instructions.    

## 2012-05-16 ENCOUNTER — Encounter: Payer: Self-pay | Admitting: *Deleted

## 2012-05-20 ENCOUNTER — Encounter: Payer: Self-pay | Admitting: Internal Medicine

## 2012-05-20 ENCOUNTER — Ambulatory Visit (INDEPENDENT_AMBULATORY_CARE_PROVIDER_SITE_OTHER): Payer: BC Managed Care – PPO | Admitting: Internal Medicine

## 2012-05-20 VITALS — BP 100/73 | HR 80 | Ht 66.0 in | Wt 184.0 lb

## 2012-05-20 DIAGNOSIS — I5032 Chronic diastolic (congestive) heart failure: Secondary | ICD-10-CM

## 2012-05-20 DIAGNOSIS — I4891 Unspecified atrial fibrillation: Secondary | ICD-10-CM

## 2012-05-20 DIAGNOSIS — Z95810 Presence of automatic (implantable) cardiac defibrillator: Secondary | ICD-10-CM

## 2012-05-20 DIAGNOSIS — I5042 Chronic combined systolic (congestive) and diastolic (congestive) heart failure: Secondary | ICD-10-CM | POA: Insufficient documentation

## 2012-05-20 DIAGNOSIS — I4892 Unspecified atrial flutter: Secondary | ICD-10-CM

## 2012-05-20 DIAGNOSIS — I421 Obstructive hypertrophic cardiomyopathy: Secondary | ICD-10-CM

## 2012-05-20 LAB — ICD DEVICE OBSERVATION
AL IMPEDENCE ICD: 412.5 Ohm
ATRIAL PACING ICD: 48 pct
BATTERY VOLTAGE: 2.5718 V
BRDY-0004RV: 100 {beats}/min
DEVICE MODEL ICD: 523975
HV IMPEDENCE: 74 Ohm
MODE SWITCH EPISODES: 0
PACEART VT: 0
RV LEAD THRESHOLD: 1 V
TOT-0006: 20090416000000
TOT-0007: 2

## 2012-05-20 MED ORDER — FUROSEMIDE 20 MG PO TABS: 20.0000 mg | ORAL_TABLET | Freq: Every day | ORAL | Status: DC

## 2012-05-20 NOTE — Assessment & Plan Note (Signed)
The patient is stable although I suspect her shortness of breath and edema is related to diastolic heart failure. Also impressed at her neck venous distention with prominent X. and Y. descents We'll undertake an echo to look at left ventricular function, pulmonary pressures, tricuspid regurgitation.

## 2012-05-20 NOTE — Assessment & Plan Note (Signed)
47% atrial tach/fib   Continue current course,  Some rapid rates and owuld lean towards increasing betablocker

## 2012-05-20 NOTE — Assessment & Plan Note (Signed)
.  dmf The patient's device was interrogated.  The information was reviewed. No changes were made in the programming.

## 2012-05-20 NOTE — Assessment & Plan Note (Signed)
She has evidence of volume overload. We will plan to add a diuretic on regular basis as she had significant symptomatic improvement a couple of months ago with 2 days of diuretics. We will start at 20 mg daily. We'll check a metabolic profile 16-10 days as she is on dofetilide.

## 2012-05-20 NOTE — Progress Notes (Signed)
Patient Care Team: Farley Ly, MD as PCP - General Duke Salvia, MD as Consulting Physician (Cardiology) Gar Ponto, PHARMD as Pharmacist (Pharmacist)   HPI  [name] is a 46 y.o. female Seen in followup for hypertrophic cardiomyopathy status post ICD implantation. She's also had atrial fibrillation for which he underwent catheter ablation at [facility] and then by Dr. Macon Large at [facility] and most recently a convergent procedure at [facility] 10/12. He also has a history of nonsustained ventricular tachycardia  Past Medical History  Diagnosis Date  . Hypertrophic obstructive cardiomyopathy (HOCM)     TEE 09/02/2007 showed moderate to severe left ventricular hypertrophy with an interventricular septal dimension of 1.5 cm and posterior wall dimension of 7 cm; there was normal LV systolic function and estimated EF of approximately 60%; there did not appear to be any obvious outflow tract obstruction.   . Atrial fibrillation     S/P radiofrequency ablation by Dr. Clydie Braun at [facility] on 02/09/2008 and again on 03/31/2009; S/P ablation at [facility] by Dr. Marchia Bond on 08/10/2010, followed by recurrent atrial flutter/fibrillation.  Cardiac cath done on 02/19/2011 at [facility] showed no significant coronary disease.  Patient underwent Convergent Procedure at [facility] on 03/07/2011.  . Atrial tachycardia     Status post convergent ablation [facility] October 2012  . Ventricular tachycardia     Hx of nonsustained ventricular tachycardia, S/P hospitalization in March 2009; S/P ICD placement by Dr. Duke Salvia on 09/25/2007.  Marland Kitchen Anxiety     Acute stress reaction to multiple ICD shocks  . Automatic implantable cardiac defibrillator- st judes 09/2007  . Seasonal allergic rhinitis   . GERD (gastroesophageal reflux disease)   . Hyperglycemia   . Dizziness   . Anemia   . Cholecystitis, acute 07/2001    S/P laparoscopic cholecystectomy, intraoperative cholangiogram, and transcystic  common bile duct exploration by Dr. Avel Peace on 07/14/2001  . Pelvic mass     Pelvic ultrasound done on 11/30/10 to evaluate abdominal pain showed,  positioned between the ovaries and contiguous with the ovaries, a complex mass demonstrating posterior acoustical enhancement and no definite intralesional flow with color Doppler exam measuring 5.2 x 3.1 x 5.5 cm. The appearance raised the possibility of a ruptured ovarian cyst with subsequent development of a hematoma.   . Fibroids   . 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 Herbert Moors to evaluate anemia; pathology showed a tubulovillous adenoma with no high grade dysplasia or malignancy identified.    . Abnormal thyroid blood test     Mildly elevated TSH, resolved.    Past Surgical History  Procedure Date  . Cardiac defibrillator placement 09/25/2007    Placed by Dr. Duke Salvia on 09/25/2007.  . Laparoscopic cholecystectomy 07/14/2001    S/P laparoscopic cholecystectomy, intraoperative cholangiogram, and transcystic common bile duct exploration by Dr. Avel Peace on 07/14/2001  . Convergent ablation 03/07/2011    Patient underwent Convergent Procedure at [facility] on 03/07/2011.  . Atrial ablation surgery 08/10/2010    ablation  done at [facility]  . Atrial ablation surgery 03/2009    ablation at [facility]  . Atrial ablation surgery 01/2008    ablation at [facility]  . Flexible sigmoidoscopy 05/26/2011    Procedure: FLEXIBLE SIGMOIDOSCOPY;  Surgeon: Shirley Friar, MD;  Location: [facility] ENDOSCOPY;  Service: Endoscopy;  Laterality: N/A;    Current Outpatient Prescriptions  Medication Sig Dispense Refill  . acetaminophen (TYLENOL) 500 MG tablet Take 500 mg by mouth every 6 (six) hours as needed. For pain      . ALPRAZolam (XANAX) 0.25 MG tablet Take 0.125-0.25 mg by mouth daily as needed. For anxiety.      Marland Kitchen diltiazem (CARDIZEM CD) 180 MG 24 hr capsule Take 180 mg by mouth  daily.       Marland Kitchen dofetilide (TIKOSYN) 500 MCG capsule Take 500 mcg by mouth 2 (two) times daily.        . ferrous gluconate (FERGON) 240 (27 FE) MG tablet Take 1 tablet (240 mg total) by mouth 2 (two) times daily with a meal.  60 tablet  3  . metoprolol (TOPROL-XL) 50 MG 24 hr tablet Take 50 mg by mouth 2 (two) times daily.       . Omega-3 Fatty Acids (FISH OIL) 1200 MG CAPS Take 1 capsule (1,200 mg total) by mouth daily.      . propranolol (INDERAL) 20 MG tablet Take 20 mg by mouth every 6 (six) hours as needed. As needed for increased heart rate      . sertraline (ZOLOFT) 50 MG tablet Take 50 mg by mouth daily.       Marland Kitchen warfarin (COUMADIN) 4 MG tablet USE AS DIRECTED BY ANTICOAGULATION CLINIC  40 tablet  2  . [DISCONTINUED] esomeprazole (NEXIUM) 20 MG capsule Take 1 capsule (20 mg total) by mouth daily.  30 capsule  0    No Known Allergies  Review of Systems negative except from HPI and PMH  Physical Exam BP 100/73  Pulse 80  Ht 5\' 6"  (1.676 m)  Wt 184 lb (83.462 kg)  BMI 29.70 kg/m2 Well developed and well nourished in no acute distress HENT normal E scleral and icterus clear Neck Supple JVP flat; carotids brisk and full Clear to ausculation irregular rate and rhythm, no murmurs gallops or rub Soft with active bowel sounds No clubbing cyanosis Trace Edema Alert and oriented, grossly normal motor and sensory function Skin Warm and Dry  Electrocardiogram demonstrates atrial tachycardia/flutter with an atrial cycle length of  260 ms with intermittent conduction and ventricular pacing  Assessment and  Plan

## 2012-05-20 NOTE — Patient Instructions (Addendum)
Remote monitoring is used to monitor your Pacemaker of ICD from home. This monitoring reduces the number of office visits required to check your device to one time per year. It allows [name] to keep an eye on the functioning of your device to ensure it is working properly. You are scheduled for a device check from home on 08/25/2012. You may send your transmission at any time that day. If you have a wireless device, the transmission will be sent automatically. After your physician reviews your transmission, you will receive a postcard with your next transmission date.  Your physician wants you to follow-up in: 6 months with Dr. Graciela Husbands.  You will receive a reminder letter in the mail two months in advance. If you don't receive a letter, please call our office to schedule the follow-up appointment.  Take Lasix 20mg  daily.  Your physician has requested that you have an echocardiogram. Echocardiography is a painless test that uses sound waves to create images of your heart. It provides your doctor with information about the size and shape of your heart and how well your heart's chambers and valves are working. This procedure takes approximately one hour. There are no restrictions for this procedure.  Your physician recommends that you return for lab work in: 3 weeks, BMET

## 2012-06-02 ENCOUNTER — Ambulatory Visit (INDEPENDENT_AMBULATORY_CARE_PROVIDER_SITE_OTHER): Payer: BC Managed Care – PPO | Admitting: Pharmacist

## 2012-06-02 DIAGNOSIS — Z7901 Long term (current) use of anticoagulants: Secondary | ICD-10-CM

## 2012-06-02 DIAGNOSIS — D649 Anemia, unspecified: Secondary | ICD-10-CM

## 2012-06-02 NOTE — Patient Instructions (Signed)
Patient instructed to take medications as defined in the Anti-coagulation Track section of this encounter.  Patient instructed to take today's dose.  Patient verbalized understanding of these instructions.    

## 2012-06-02 NOTE — Progress Notes (Signed)
Anti-Coagulation Progress Note  [name] is a 46 y.o. female who is currently on an anti-coagulation regimen.    RECENT RESULTS: Recent results are below, the most recent result is correlated with a dose of 32 mg. per week: Lab Results  Component Value Date   INR 4.40 06/02/2012   INR 2.0 05/05/2012   INR 2.70 04/07/2012    ANTI-COAG DOSE:   Latest dosing instructions   Total Sun Mon Tue Wed Thu Fri Sat   28 4 mg 4 mg 4 mg 4 mg 4 mg 4 mg 4 mg    (4 mg1) (4 mg1) (4 mg1) (4 mg1) (4 mg1) (4 mg1) (4 mg1)         ANTICOAG SUMMARY: Anticoagulation Episode Summary              Current INR goal 2.0-3.0 Next INR check 06/16/2012   INR from last check 4.40! (06/02/2012)     Weekly max dose (mg)  Target end date Indefinite   Indications Long-term (current) use of anticoagulants [V58.61], A-fib (Resolved) [427.31]   INR check location [facility]    Send INR reminders to ANTICOAG IMP   Comments        Provider Role Specialty Phone number   Farley Ly, MD  Internal Medicine [phone]        ANTICOAG TODAY: Anticoagulation Summary as of 06/02/2012              INR goal 2.0-3.0     Selected INR 4.40! (06/02/2012) Next INR check 06/16/2012   Weekly max dose (mg)  Target end date Indefinite   Indications Long-term (current) use of anticoagulants [V58.61], A-fib (Resolved) [427.31]    Anticoagulation Episode Summary              INR check location [facility] Preferred lab    Send INR reminders to ANTICOAG IMP   Comments        Provider Role Specialty Phone number   Farley Ly, MD  Internal Medicine [phone]        PATIENT INSTRUCTIONS: Patient Instructions  Patient instructed to take medications as defined in the Anti-coagulation Track section of this encounter.  Patient instructed to take today's dose.  Patient verbalized understanding of these instructions.        FOLLOW-UP Return in 2 weeks (on 06/16/2012) for  Follow up INR at 4:30PM.  Hulen Luster, III Pharm.D., CACP

## 2012-06-13 ENCOUNTER — Other Ambulatory Visit: Payer: Self-pay

## 2012-06-13 ENCOUNTER — Other Ambulatory Visit (INDEPENDENT_AMBULATORY_CARE_PROVIDER_SITE_OTHER): Payer: BC Managed Care – PPO

## 2012-06-13 ENCOUNTER — Ambulatory Visit (HOSPITAL_COMMUNITY): Payer: BC Managed Care – PPO | Attending: Internal Medicine | Admitting: Radiology

## 2012-06-13 DIAGNOSIS — I059 Rheumatic mitral valve disease, unspecified: Secondary | ICD-10-CM | POA: Insufficient documentation

## 2012-06-13 DIAGNOSIS — I369 Nonrheumatic tricuspid valve disorder, unspecified: Secondary | ICD-10-CM | POA: Insufficient documentation

## 2012-06-13 DIAGNOSIS — I509 Heart failure, unspecified: Secondary | ICD-10-CM | POA: Insufficient documentation

## 2012-06-13 DIAGNOSIS — I4891 Unspecified atrial fibrillation: Secondary | ICD-10-CM | POA: Insufficient documentation

## 2012-06-13 DIAGNOSIS — I4892 Unspecified atrial flutter: Secondary | ICD-10-CM

## 2012-06-13 DIAGNOSIS — I379 Nonrheumatic pulmonary valve disorder, unspecified: Secondary | ICD-10-CM | POA: Insufficient documentation

## 2012-06-13 LAB — BASIC METABOLIC PANEL
BUN: 19 mg/dL (ref 6–23)
CO2: 29 mEq/L (ref 19–32)
Calcium: 9.4 mg/dL (ref 8.4–10.5)
Creatinine, Ser: 0.9 mg/dL (ref 0.4–1.2)
GFR: 74.6 mL/min (ref >60.00)
Glucose, Bld: 87 mg/dL (ref 70–99)

## 2012-06-13 NOTE — Progress Notes (Signed)
Echocardiogram performed.  

## 2012-06-16 ENCOUNTER — Ambulatory Visit (INDEPENDENT_AMBULATORY_CARE_PROVIDER_SITE_OTHER): Payer: BC Managed Care – PPO | Admitting: Pharmacist

## 2012-06-16 DIAGNOSIS — D649 Anemia, unspecified: Secondary | ICD-10-CM

## 2012-06-16 DIAGNOSIS — Z7901 Long term (current) use of anticoagulants: Secondary | ICD-10-CM

## 2012-06-16 NOTE — Patient Instructions (Signed)
Patient instructed to take medications as defined in the Anti-coagulation Track section of this encounter.  Patient instructed to take today's dose.  Patient verbalized understanding of these instructions.    

## 2012-06-16 NOTE — Progress Notes (Signed)
Anti-Coagulation Progress Note  [name] is a 47 y.o. female who is currently on an anti-coagulation regimen.    RECENT RESULTS: Recent results are below, the most recent result is correlated with a dose of 28 mg. per week: Lab Results  Component Value Date   INR 2.60 06/16/2012   INR 4.40 06/02/2012   INR 2.0 05/05/2012    ANTI-COAG DOSE:   Latest dosing instructions   Total Sun Mon Tue Wed Thu Fri Sat   28 4 mg 4 mg 4 mg 4 mg 4 mg 4 mg 4 mg    (4 mg1) (4 mg1) (4 mg1) (4 mg1) (4 mg1) (4 mg1) (4 mg1)         ANTICOAG SUMMARY: Anticoagulation Episode Summary              Current INR goal 2.0-3.0 Next INR check 07/14/2012   INR from last check 2.60 (06/16/2012)     Weekly max dose (mg)  Target end date Indefinite   Indications Long-term (current) use of anticoagulants [V58.61], A-fib (Resolved) [427.31]   INR check location [facility] Preferred lab    Send INR reminders to ANTICOAG IMP   Comments        Provider Role Specialty Phone number   Farley Ly, MD  Internal Medicine [phone]        ANTICOAG TODAY: Anticoagulation Summary as of 06/16/2012              INR goal 2.0-3.0     Selected INR 2.60 (06/16/2012) Next INR check 07/14/2012   Weekly max dose (mg)  Target end date Indefinite   Indications Long-term (current) use of anticoagulants [V58.61], A-fib (Resolved) [427.31]    Anticoagulation Episode Summary              INR check location [facility] Preferred lab    Send INR reminders to ANTICOAG IMP   Comments        Provider Role Specialty Phone number   Farley Ly, MD  Internal Medicine [phone]        PATIENT INSTRUCTIONS: Patient Instructions  Patient instructed to take medications as defined in the Anti-coagulation Track section of this encounter.  Patient instructed to take today's dose.  Patient verbalized understanding of these instructions.        FOLLOW-UP Return in 4 weeks (on 07/14/2012) for Follow up  INR at 4:30PM.  Hulen Luster, III Pharm.D., CACP

## 2012-06-20 ENCOUNTER — Telehealth: Payer: Self-pay | Admitting: Internal Medicine

## 2012-06-20 NOTE — Telephone Encounter (Signed)
Dr. Graciela Husbands, please review so I can call the patient.

## 2012-06-20 NOTE — Telephone Encounter (Signed)
Pt requesting echo and blood work or after 5p [phone]

## 2012-06-23 NOTE — Telephone Encounter (Signed)
i presume these are being requested by [facility]  That would be fine, then Thanks seve

## 2012-06-25 ENCOUNTER — Telehealth: Payer: Self-pay | Admitting: Internal Medicine

## 2012-06-25 ENCOUNTER — Other Ambulatory Visit: Payer: Self-pay | Admitting: Internal Medicine

## 2012-06-25 NOTE — Telephone Encounter (Signed)
Follow-up:    Patient is returning your call.  Please call back

## 2012-06-25 NOTE — Telephone Encounter (Signed)
Spoke with pt, see 06-20-12 phone note

## 2012-06-25 NOTE — Telephone Encounter (Signed)
Spoke with pt, she was calling to get the results of the lab work and echo. Pt made aware of the labs. She would like to know if there is any change from the recent echo and the one she had in 2011. Will forward for dr Graciela Husbands to review.

## 2012-06-25 NOTE — Telephone Encounter (Signed)
Left message for pt to call.

## 2012-07-06 NOTE — Telephone Encounter (Signed)
Pretty similar

## 2012-07-10 NOTE — Telephone Encounter (Signed)
I spoke with the patient and made her aware that Dr. Graciela Husbands reviewed her echo and stated her most recent was similar to her previous. I have asked if she would like a copy of this sent to [facility]. She would like the echo sent to Dr. Herbert Deaner and she would like a copy of her labs mailed to her. I have verified her mailing address.

## 2012-07-14 ENCOUNTER — Ambulatory Visit (INDEPENDENT_AMBULATORY_CARE_PROVIDER_SITE_OTHER): Payer: BC Managed Care – PPO | Admitting: Pharmacist

## 2012-07-14 DIAGNOSIS — Z7901 Long term (current) use of anticoagulants: Secondary | ICD-10-CM

## 2012-07-14 NOTE — Patient Instructions (Signed)
Patient instructed to take medications as defined in the Anti-coagulation Track section of this encounter.  Patient instructed to take today's dose.  Patient verbalized understanding of these instructions.    

## 2012-07-14 NOTE — Progress Notes (Signed)
Anti-Coagulation Progress Note  [name] is a 47 y.o. female who is currently on an anti-coagulation regimen.    RECENT RESULTS: Recent results are below, the most recent result is correlated with a dose of 28 mg. per week: Lab Results  Component Value Date   INR 2.00 07/14/2012   INR 2.60 06/16/2012   INR 4.40 06/02/2012    ANTI-COAG DOSE:   Latest dosing instructions   Total Sun Mon Tue Wed Thu Fri Sat   30 4 mg 6 mg 4 mg 4 mg 4 mg 4 mg 4 mg    (4 mg1) (4 mg1.5) (4 mg1) (4 mg1) (4 mg1) (4 mg1) (4 mg1)         ANTICOAG SUMMARY: Anticoagulation Episode Summary              Current INR goal 2.0-3.0 Next INR check 08/04/2012   INR from last check 2.00 (07/14/2012)     Weekly max dose (mg)  Target end date Indefinite   Indications Long-term (current) use of anticoagulants [V58.61], A-fib (Resolved) [427.31]   INR check location [facility] Preferred lab    Send INR reminders to ANTICOAG IMP   Comments        Provider Role Specialty Phone number   Farley Ly, MD  Internal Medicine [phone]        ANTICOAG TODAY: Anticoagulation Summary as of 07/14/2012              INR goal 2.0-3.0     Selected INR 2.00 (07/14/2012) Next INR check 08/04/2012   Weekly max dose (mg)  Target end date Indefinite   Indications Long-term (current) use of anticoagulants [V58.61], A-fib (Resolved) [427.31]    Anticoagulation Episode Summary              INR check location [facility] Preferred lab    Send INR reminders to ANTICOAG IMP   Comments        Provider Role Specialty Phone number   Farley Ly, MD  Internal Medicine [phone]        PATIENT INSTRUCTIONS: Patient Instructions  Patient instructed to take medications as defined in the Anti-coagulation Track section of this encounter.  Patient instructed to take today's dose.  Patient verbalized understanding of these instructions.        FOLLOW-UP Return in 3 weeks (on 08/04/2012) for Follow  up INR at 3:30PM.  Hulen Luster, III Pharm.D., CACP

## 2012-08-04 ENCOUNTER — Ambulatory Visit (INDEPENDENT_AMBULATORY_CARE_PROVIDER_SITE_OTHER): Payer: BC Managed Care – PPO | Admitting: Pharmacist

## 2012-08-04 DIAGNOSIS — Z7901 Long term (current) use of anticoagulants: Secondary | ICD-10-CM

## 2012-08-04 NOTE — Progress Notes (Signed)
Anti-Coagulation Progress Note  [name] is a 47 y.o. female who is currently on an anti-coagulation regimen.    RECENT RESULTS: Recent results are below, the most recent result is correlated with a dose of 30 mg. per week: Lab Results  Component Value Date   INR 2.50 08/04/2012   INR 2.00 07/14/2012   INR 2.60 06/16/2012    ANTI-COAG DOSE: Anticoagulation Dose Instructions as of 08/04/2012     [name] Tue Wed Thu Fri Sat   New Dose 4 mg 6 mg 4 mg 4 mg 4 mg 4 mg 4 mg       ANTICOAG SUMMARY: Anticoagulation Episode Summary   Current INR goal 2.0-3.0  Next INR check 09/01/2012  INR from last check 2.50 (08/04/2012)  Weekly max dose   Target end date Indefinite  [facility] check location [facility]  Preferred lab   Send INR reminders to ANTICOAG IMP   Indications  Long-term (current) use of anticoagulants [V58.61] A-fib (Resolved) [427.31]        Comments       Anticoagulation Care Providers   Provider Role Specialty Phone number   Farley Ly, MD  Internal Medicine [phone]      ANTICOAG TODAY: Anticoagulation Summary as of 08/04/2012   INR goal 2.0-3.0  Selected INR 2.50 (08/04/2012)  Next INR check 09/01/2012  Target end date Indefinite   Indications  Long-term (current) use of anticoagulants [V58.61] A-fib (Resolved) [427.31]      Anticoagulation Episode Summary   INR check location [facility]   Preferred lab    Send INR reminders to ANTICOAG IMP   Comments     Anticoagulation Care Providers   Provider Role Specialty Phone number   Farley Ly, MD  Internal Medicine [phone]      PATIENT INSTRUCTIONS: Patient Instructions  Patient instructed to take medications as defined in the Anti-coagulation Track section of this encounter.  Patient instructed to take today's dose.  Patient verbalized understanding of these instructions.       FOLLOW-UP Return in 4 weeks (on 09/01/2012) for Follow up INR at 4:30PM.  Hulen Luster,  III Pharm.D., CACP

## 2012-08-04 NOTE — Patient Instructions (Signed)
Patient instructed to take medications as defined in the Anti-coagulation Track section of this encounter.  Patient instructed to take today's dose.  Patient verbalized understanding of these instructions.    

## 2012-08-22 ENCOUNTER — Other Ambulatory Visit: Payer: Self-pay | Admitting: Internal Medicine

## 2012-08-25 ENCOUNTER — Encounter: Payer: BC Managed Care – PPO | Admitting: *Deleted

## 2012-08-27 ENCOUNTER — Encounter: Payer: Self-pay | Admitting: *Deleted

## 2012-09-01 ENCOUNTER — Ambulatory Visit (INDEPENDENT_AMBULATORY_CARE_PROVIDER_SITE_OTHER): Payer: BC Managed Care – PPO | Admitting: Pharmacist

## 2012-09-01 DIAGNOSIS — Z7901 Long term (current) use of anticoagulants: Secondary | ICD-10-CM

## 2012-09-01 NOTE — Patient Instructions (Signed)
Patient instructed to take medications as defined in the Anti-coagulation Track section of this encounter.  Patient instructed to take today's dose.  Patient verbalized understanding of these instructions.    

## 2012-09-01 NOTE — Progress Notes (Signed)
Anti-Coagulation Progress Note  Starlene L Obando is a 47 y.o. female who is currently on an anti-coagulation regimen.    RECENT RESULTS: Recent results are below, the most recent result is correlated with a dose of 30 mg. per week: Lab Results  Component Value Date   INR 2.20 09/01/2012   INR 2.50 08/04/2012   INR 2.00 07/14/2012    ANTI-COAG DOSE: Anticoagulation Dose Instructions as of 09/01/2012     [name] Tue Wed Thu Fri Sat   New Dose 4 mg 6 mg 4 mg 4 mg 6 mg 4 mg 4 mg       ANTICOAG SUMMARY: Anticoagulation Episode Summary   Current INR goal 2.0-3.0  Next INR check 09/29/2012  INR from last check 2.20 (09/01/2012)  Weekly max dose   Target end date Indefinite  [facility]  Preferred lab   Send INR reminders to ANTICOAG IMP   Indications  Long-term (current) use of anticoagulants [V58.61] A-fib (Resolved) [427.31]        Comments       Anticoagulation Care Providers   Provider Role Specialty Phone number   Farley Ly, MD  Internal Medicine [phone]      ANTICOAG TODAY: Anticoagulation Summary as of 09/01/2012   INR goal 2.0-3.0  Selected INR 2.20 (09/01/2012)  Next INR check 09/29/2012  Target end date Indefinite   Indications  Long-term (current) use of anticoagulants [V58.61] A-fib (Resolved) [427.31]      Anticoagulation Episode Summary   INR check location [facility]   Preferred lab    Send INR reminders to ANTICOAG IMP   Comments     Anticoagulation Care Providers   Provider Role Specialty Phone number   Farley Ly, MD  Internal Medicine [phone]      PATIENT INSTRUCTIONS: Patient Instructions  Patient instructed to take medications as defined in the Anti-coagulation Track section of this encounter.  Patient instructed to take today's dose.  Patient verbalized understanding of these instructions.       FOLLOW-UP Return in 4 weeks (on 09/29/2012) for Follow up INR at 4:15PM.  Hulen Luster,  III Pharm.D., CACP

## 2012-09-10 NOTE — Progress Notes (Signed)
I have reviewed the note by Dr. Alexandria Lodge.  Indication for anticoagulation is atrial fibrillation.  Agree with documented plan.

## 2012-09-17 ENCOUNTER — Encounter: Payer: Self-pay | Admitting: Internal Medicine

## 2012-09-17 ENCOUNTER — Ambulatory Visit (INDEPENDENT_AMBULATORY_CARE_PROVIDER_SITE_OTHER): Payer: BC Managed Care – PPO | Admitting: Internal Medicine

## 2012-09-17 VITALS — BP 106/73 | HR 71 | Temp 97.5°F | Ht 66.0 in | Wt 185.5 lb

## 2012-09-17 DIAGNOSIS — K219 Gastro-esophageal reflux disease without esophagitis: Secondary | ICD-10-CM

## 2012-09-17 DIAGNOSIS — I4891 Unspecified atrial fibrillation: Secondary | ICD-10-CM

## 2012-09-17 DIAGNOSIS — I5032 Chronic diastolic (congestive) heart failure: Secondary | ICD-10-CM

## 2012-09-17 DIAGNOSIS — D509 Iron deficiency anemia, unspecified: Secondary | ICD-10-CM

## 2012-09-17 LAB — FERRITIN: Ferritin: 80 ng/mL (ref 10–291)

## 2012-09-17 LAB — CBC WITH DIFFERENTIAL/PLATELET
Basophils Relative: 0 % (ref 0–1)
Hemoglobin: 13.3 g/dL (ref 12.0–15.0)
Lymphs Abs: 1.1 10*3/uL (ref 0.7–4.0)
MCHC: 33.2 g/dL (ref 30.0–36.0)
Monocytes Relative: 7 % (ref 3–12)
Neutro Abs: 3.6 10*3/uL (ref 1.7–7.7)
Neutrophils Relative %: 70 % (ref 43–77)
RBC: 4.48 MIL/uL (ref 3.87–5.11)

## 2012-09-17 LAB — COMPLETE METABOLIC PANEL WITH GFR
CO2: 29 mEq/L (ref 19–32)
Creat: 0.89 mg/dL (ref 0.50–1.10)
GFR, Est African American: 88 mL/min
GFR, Est Non African American: 76 mL/min
Glucose, Bld: 88 mg/dL (ref 70–99)
Total Bilirubin: 0.5 mg/dL (ref 0.3–1.2)

## 2012-09-17 NOTE — Assessment & Plan Note (Signed)
Assessment: Patient is asymptomatic, currently on no medications.  Plan: Continue current management.

## 2012-09-17 NOTE — Assessment & Plan Note (Signed)
Assessment: Patient was started on furosemide a few months ago by Dr. Graciela Husbands, and she reports resolution of her edema.  She currently is doing well.  Plan: Continue current regimen including diltiazem CD 180 mg daily, metoprolol XL 50 mg twice a day, and furosemide 20 mg daily, in addition to her other cardiac medications.  Will check a comprehensive metabolic panel today.

## 2012-09-17 NOTE — Patient Instructions (Signed)
Continue current medications. 

## 2012-09-17 NOTE — Progress Notes (Signed)
  Subjective:    Patient ID: [name], female    DOB: 06/05/1966, 47 y.o.   MRN: [id number]  HPI Patient returns for followup of her atrial fibrillation, diastolic heart failure, iron deficiency anemia, GERD, and other medical problems. She has no acute complaints today, and reports that she has been doing well.  She is still following up with her cardiologist Dr. Graciela Husbands at [facility] and with her cardiologist Dr. Herbert Deaner at [facility].  She reports occasional brief episodes of palpitations which occur about once a day and last for only a few minutes; her heart rate is less than 100 with those episodes.  She has no GERD symptoms, and is currently on no medication.  She is doing well on Zoloft without any symptoms of anxiety or depression; she reports that she has not taken alprazolam in over a year and a half.  She was started on furosemide a few months ago by Dr. Graciela Husbands because of mild edema, and reports improvement on the diuretic.   Review of Systems  Constitutional: Negative for fever, chills, diaphoresis and unexpected weight change.  HENT: Negative for hearing loss.   Eyes: Negative for visual disturbance.  Respiratory: Negative for cough, shortness of breath and wheezing.   Cardiovascular: Positive for leg swelling (Mild , started on furosemide by Dr. Graciela Husbands, improved.). Negative for chest pain.  Gastrointestinal: Negative for nausea, vomiting, abdominal pain and blood in stool.  Genitourinary: Negative for dysuria, vaginal bleeding and difficulty urinating.  Musculoskeletal: Negative for myalgias and arthralgias.  Skin: Negative for rash.  Neurological: Negative for dizziness, syncope, weakness and numbness.       Objective:   Physical Exam  Constitutional: No distress.  Cardiovascular: S1 normal and S2 normal.  An irregular rhythm present. Exam reveals no S3.   Murmur heard.  Systolic murmur is present with a grade of 1/6  Pulmonary/Chest: Effort normal and breath sounds normal. She has no  wheezes. She has no rales.  Abdominal: Soft. Bowel sounds are normal. There is no tenderness. There is no guarding.  Musculoskeletal: She exhibits no edema.        Assessment & Plan:

## 2012-09-17 NOTE — Assessment & Plan Note (Signed)
Assessment: Patient is doing well with only brief episodes of palpitations which are self-limited.  Plan: Continue current medications; follow up with Dr. Graciela Husbands and Dr. Herbert Deaner he as scheduled

## 2012-09-17 NOTE — Assessment & Plan Note (Signed)
Hemoglobin  Date Value Range Status  07/30/2011 10.7* 12.0-15.0 (g/dL) Final  09/17/8117 9.7* 12.0-15.0 (g/dL) Final  14/78/2956 9.0* 12.0-15.0 (g/dL) Final     Ferritin  Date Value Range Status  07/30/2011 14  10-291 (ng/mL) Final  05/01/2011 37  10-291 (ng/mL) Final     Assessment: Patient has no symptoms of anemia  She reports that she has been taking her iron supplement without side effects.  Plan: Continue ferrous gluconate at current dose pending a CBC and ferritin today.

## 2012-09-18 NOTE — Progress Notes (Signed)
Quick Note:  I called patient and discussed her lab results. Given the ferritin of 80, I advised that she stop ferrous gluconate and we can recheck a ferritin at next visit. ______

## 2012-09-29 ENCOUNTER — Telehealth: Payer: Self-pay | Admitting: *Deleted

## 2012-09-29 ENCOUNTER — Ambulatory Visit (INDEPENDENT_AMBULATORY_CARE_PROVIDER_SITE_OTHER): Payer: BC Managed Care – PPO | Admitting: Pharmacist

## 2012-09-29 DIAGNOSIS — Z5181 Encounter for therapeutic drug level monitoring: Secondary | ICD-10-CM

## 2012-09-29 DIAGNOSIS — Z7901 Long term (current) use of anticoagulants: Secondary | ICD-10-CM

## 2012-09-29 LAB — POCT INR: INR: 2.1

## 2012-09-29 NOTE — Telephone Encounter (Signed)
Error

## 2012-09-29 NOTE — Progress Notes (Signed)
Anti-Coagulation Progress Note  [name] is a 47 y.o. female who is currently on an anti-coagulation regimen.    RECENT RESULTS: Recent results are below, the most recent result is correlated with a dose of 32 mg. per week: Lab Results  Component Value Date   INR 2.1 09/29/2012   INR 2.20 09/01/2012   INR 2.50 08/04/2012    ANTI-COAG DOSE: Anticoagulation Dose Instructions as of 09/29/2012     [name] Tue Wed Thu Fri Sat   New Dose 4 mg 6 mg 4 mg 6 mg 4 mg 6 mg 4 mg       ANTICOAG SUMMARY: Anticoagulation Episode Summary   Current INR goal 2.0-3.0  Next INR check 10/27/2012  INR from last check 2.1 (09/29/2012)  Weekly max dose   Target end date Indefinite  [facility] check [facility]  Preferred lab   Send INR reminders to ANTICOAG IMP   Indications  Long-term (current) use of anticoagulants [V58.61] A-fib (Resolved) [427.31]        Comments       Anticoagulation Care Providers   Provider Role Specialty Phone number   Farley Ly, MD  Internal Medicine [phone]      ANTICOAG TODAY: Anticoagulation Summary as of 09/29/2012   INR goal 2.0-3.0  Selected INR 2.1 (09/29/2012)  Next INR check 10/27/2012  Target end date Indefinite   Indications  Long-term (current) use of anticoagulants [V58.61] A-fib (Resolved) [427.31]      Anticoagulation Episode Summary   [facility] check [facility]   Preferred lab    Send INR reminders to ANTICOAG IMP   Comments     Anticoagulation Care Providers   Provider Role Specialty Phone number   Farley Ly, MD  Internal Medicine [phone]      PATIENT INSTRUCTIONS: Patient Instructions  Patient instructed to take medications as defined in the Anti-coagulation Track section of this encounter.  Patient instructed to take today's dose.  Patient verbalized understanding of these instructions.       FOLLOW-UP Return in 4 weeks (on 10/27/2012) for Follow up INR at 4:30PM.  Hulen Luster,  III Pharm.D., CACP

## 2012-09-29 NOTE — Patient Instructions (Signed)
Patient instructed to take medications as defined in the Anti-coagulation Track section of this encounter.  Patient instructed to take today's dose.  Patient verbalized understanding of these instructions.    

## 2012-10-27 ENCOUNTER — Ambulatory Visit (INDEPENDENT_AMBULATORY_CARE_PROVIDER_SITE_OTHER): Payer: BC Managed Care – PPO | Admitting: Pharmacist

## 2012-10-27 DIAGNOSIS — Z7901 Long term (current) use of anticoagulants: Secondary | ICD-10-CM

## 2012-10-27 DIAGNOSIS — Z5181 Encounter for therapeutic drug level monitoring: Secondary | ICD-10-CM

## 2012-10-27 NOTE — Patient Instructions (Signed)
Patient instructed to take medications as defined in the Anti-coagulation Track section of this encounter.  Patient instructed to take today's dose.  Patient verbalized understanding of these instructions.    

## 2012-10-27 NOTE — Progress Notes (Signed)
Anti-Coagulation Progress Note  [name] is a 47 y.o. female who is currently on an anti-coagulation regimen.    RECENT RESULTS: Recent results are below, the most recent result is correlated with a dose of 34 mg. per week: Lab Results  Component Value Date   INR 2.80 10/27/2012   INR 2.1 09/29/2012   INR 2.20 09/01/2012    ANTI-COAG DOSE: Anticoagulation Dose Instructions as of 10/27/2012     [name] Tue Wed Thu Fri Sat   New Dose 4 mg 6 mg 4 mg 6 mg 4 mg 6 mg 4 mg       ANTICOAG SUMMARY: Anticoagulation Episode Summary   Current INR goal 2.0-3.0  Next INR check 11/24/2012  INR from last check 2.80 (10/27/2012)  Weekly max dose   Target end date Indefinite  [facility]  Preferred lab   Send INR reminders to ANTICOAG IMP   Indications  Long-term (current) use of anticoagulants [V58.61] A-fib (Resolved) [427.31]        Comments       Anticoagulation Care Providers   Provider Role Specialty Phone number   Farley Ly, MD  Internal Medicine [phone]      ANTICOAG TODAY: Anticoagulation Summary as of 10/27/2012   INR goal 2.0-3.0  Selected INR 2.80 (10/27/2012)  Next INR check 11/24/2012  Target end date Indefinite   Indications  Long-term (current) use of anticoagulants [V58.61] A-fib (Resolved) [427.31]      Anticoagulation Episode Summary   [facility] check [facility]   Preferred lab    Send INR reminders to ANTICOAG IMP   Comments     Anticoagulation Care Providers   Provider Role Specialty Phone number   Farley Ly, MD  Internal Medicine [phone]      PATIENT INSTRUCTIONS: Patient Instructions  Patient instructed to take medications as defined in the Anti-coagulation Track section of this encounter.  Patient instructed to take today's dose.  Patient verbalized understanding of these instructions.       FOLLOW-UP Return in 4 weeks (on 11/24/2012) for Follow up INR at 4:30PM.  Hulen Luster,  III Pharm.D., CACP

## 2012-11-24 ENCOUNTER — Ambulatory Visit (INDEPENDENT_AMBULATORY_CARE_PROVIDER_SITE_OTHER): Payer: BC Managed Care – PPO | Admitting: Pharmacist

## 2012-11-24 DIAGNOSIS — Z7901 Long term (current) use of anticoagulants: Secondary | ICD-10-CM

## 2012-11-24 NOTE — Progress Notes (Signed)
Anti-Coagulation Progress Note  [name] is a 47 y.o. female who is currently on an anti-coagulation regimen.    RECENT RESULTS: Recent results are below, the most recent result is correlated with a dose of 34 mg. per week: Lab Results  Component Value Date   INR 4.80 11/24/2012   INR 2.80 10/27/2012   INR 2.1 09/29/2012    ANTI-COAG DOSE: Anticoagulation Dose Instructions as of 11/24/2012     [name] Tue Wed Thu Fri Sat   New Dose 4 mg 4 mg 4 mg 6 mg 4 mg 4 mg 4 mg       ANTICOAG SUMMARY: Anticoagulation Episode Summary   Current INR goal 2.0-3.0  Next INR check 12/08/2012  INR from last check 4.80! (11/24/2012)  Weekly max dose   Target end date Indefinite  [facility]  Preferred lab   Send INR reminders to ANTICOAG IMP   Indications  Long-term (current) use of anticoagulants [V58.61] A-fib (Resolved) [427.31]        Comments       Anticoagulation Care Providers   Provider Role Specialty Phone number   Farley Ly, MD  Internal Medicine [phone]      ANTICOAG TODAY: Anticoagulation Summary as of 11/24/2012   INR goal 2.0-3.0  Selected INR 4.80! (11/24/2012)  Next INR check 12/08/2012  Target end date Indefinite   Indications  Long-term (current) use of anticoagulants [V58.61] A-fib (Resolved) [427.31]      Anticoagulation Episode Summary   [facility] check [facility]   Preferred lab    Send INR reminders to ANTICOAG IMP   Comments     Anticoagulation Care Providers   Provider Role Specialty Phone number   Farley Ly, MD  Internal Medicine [phone]      PATIENT INSTRUCTIONS: Patient Instructions  Patient instructed to take medications as defined in the Anti-coagulation Track section of this encounter.  Patient instructed to take today's dose.  Patient verbalized understanding of these instructions.       FOLLOW-UP Return in 2 weeks (on 12/08/2012) for Follow up INR at 4:30PM.  Hulen Luster,  III Pharm.D., CACP

## 2012-11-24 NOTE — Patient Instructions (Signed)
Patient instructed to take medications as defined in the Anti-coagulation Track section of this encounter.  Patient instructed to take today's dose.  Patient verbalized understanding of these instructions.    

## 2012-12-08 ENCOUNTER — Ambulatory Visit (INDEPENDENT_AMBULATORY_CARE_PROVIDER_SITE_OTHER): Payer: BC Managed Care – PPO | Admitting: Pharmacist

## 2012-12-08 DIAGNOSIS — Z7901 Long term (current) use of anticoagulants: Secondary | ICD-10-CM

## 2012-12-08 LAB — POCT INR: INR: 2.6

## 2012-12-08 MED ORDER — WARFARIN SODIUM 4 MG PO TABS: ORAL_TABLET | ORAL | Status: DC

## 2012-12-08 NOTE — Progress Notes (Signed)
Anti-Coagulation Progress Note  [name] is a 47 y.o. female who is currently on an anti-coagulation regimen.    RECENT RESULTS: Recent results are below, the most recent result is correlated with a dose of 30 mg. per week: Lab Results  Component Value Date   INR 2.60 12/08/2012   INR 4.80 11/24/2012   INR 2.80 10/27/2012    ANTI-COAG DOSE: Anticoagulation Dose Instructions as of 12/08/2012     [name] Tue Wed Thu Fri Sat   New Dose 4 mg 4 mg 4 mg 6 mg 4 mg 4 mg 4 mg       ANTICOAG SUMMARY: Anticoagulation Episode Summary   Current INR goal 2.0-3.0  Next INR check 01/05/2013  INR from last check 2.60 (12/08/2012)  Weekly max dose   Target end date Indefinite  [facility]  Preferred lab   Send INR reminders to ANTICOAG IMP   Indications  Long-term (current) use of anticoagulants [V58.61] A-fib (Resolved) [427.31]        Comments       Anticoagulation Care Providers   Provider Role Specialty Phone number   Farley Ly, MD  Internal Medicine [phone]      ANTICOAG TODAY: Anticoagulation Summary as of 12/08/2012   INR goal 2.0-3.0  Selected INR 2.60 (12/08/2012)  Next INR check 01/05/2013  Target end date Indefinite   Indications  Long-term (current) use of anticoagulants [V58.61] A-fib (Resolved) [427.31]      Anticoagulation Episode Summary   [facility] check [facility]   Preferred lab    Send INR reminders to ANTICOAG IMP   Comments     Anticoagulation Care Providers   Provider Role Specialty Phone number   Farley Ly, MD  Internal Medicine [phone]      PATIENT INSTRUCTIONS: Patient Instructions  Patient instructed to take medications as defined in the Anti-coagulation Track section of this encounter.  Patient instructed to take today's dose.  Patient verbalized understanding of these instructions.       FOLLOW-UP Return in 4 weeks (on 01/05/2013) for Follow up INR at 4:30PM.  Hulen Luster,  III Pharm.D., CACP

## 2012-12-08 NOTE — Patient Instructions (Signed)
Patient instructed to take medications as defined in the Anti-coagulation Track section of this encounter.  Patient instructed to take today's dose.  Patient verbalized understanding of these instructions.    

## 2012-12-31 ENCOUNTER — Encounter: Payer: Self-pay | Admitting: *Deleted

## 2013-01-01 ENCOUNTER — Encounter: Payer: Self-pay | Admitting: Internal Medicine

## 2013-01-01 ENCOUNTER — Ambulatory Visit (INDEPENDENT_AMBULATORY_CARE_PROVIDER_SITE_OTHER): Payer: BC Managed Care – PPO | Admitting: Internal Medicine

## 2013-01-01 VITALS — BP 99/69 | HR 87 | Ht 66.0 in | Wt 182.6 lb

## 2013-01-01 DIAGNOSIS — I472 Ventricular tachycardia, unspecified: Secondary | ICD-10-CM

## 2013-01-01 DIAGNOSIS — Z95810 Presence of automatic (implantable) cardiac defibrillator: Secondary | ICD-10-CM

## 2013-01-01 LAB — ICD DEVICE OBSERVATION
AL AMPLITUDE: 5 mv
AL THRESHOLD: 0.5 V
ATRIAL PACING ICD: 35 pct
BAMS-0001: 150 {beats}/min
DEV-0020ICD: NEGATIVE
DEVICE MODEL ICD: 523975
FVT: 0
PACEART VT: 0
RV LEAD AMPLITUDE: 11.7 mv
TOT-0008: 0
VENTRICULAR PACING ICD: 13 pct
VF: 0

## 2013-01-01 NOTE — Patient Instructions (Signed)
Your physician wants you to follow-up in: 4 months with Dr. Klein. You will receive a reminder letter in the mail two months in advance. If you don't receive a letter, please call our office to schedule the follow-up appointment.  Your physician recommends that you continue on your current medications as directed. Please refer to the Current Medication list given to you today.  

## 2013-01-01 NOTE — Progress Notes (Signed)
Patient Care Team: Farley Ly, MD as PCP - General Duke Salvia, MD as Consulting Physician (Cardiology) Jeannine Boga III, [facility] as Pharmacist (Pharmacist)   HPI  [name] is a 47 y.o. female Seen in followup for hypertrophic cardiomyopathy status post ICD implantation. She's also had atrial fibrillation for which she underwent catheter ablation at [facility] and then by Dr. Macon Large at [facility] and most recently a convergent procedure at [facility] 10/12. sHe also has a history of nonsustained ventricular tachycardia.  She has persistent atrial arrhythmia and is followed at [facility] was seen last week  They decided to keep her on tikosyn and she is feeling pretty good  Past Medical History  Diagnosis Date  . Hypertrophic obstructive cardiomyopathy (HOCM)     TEE 09/02/2007 showed moderate to severe left ventricular hypertrophy with an interventricular septal dimension of 1.5 cm and posterior wall dimension of 7 cm; there was normal LV systolic function and estimated EF of approximately 60%; there did not appear to be any obvious outflow tract obstruction.   . Atrial fibrillation     S/P radiofrequency ablation by Dr. Clydie Braun at [facility] on 02/09/2008 and again on 03/31/2009; S/P ablation at [facility] by Dr. Marchia Bond on 08/10/2010, followed by recurrent atrial flutter/fibrillation.  Cardiac cath done on 02/19/2011 at [facility] showed no significant coronary disease.  Patient underwent Convergent Procedure at [facility] on 03/07/2011.  . Atrial tachycardia     Status post convergent ablation [facility] October 2012  . Ventricular tachycardia     Hx of nonsustained ventricular tachycardia, S/P hospitalization in March 2009; S/P ICD placement by Dr. Duke Salvia on 09/25/2007.  Marland Kitchen Anxiety     Acute stress reaction to multiple ICD shocks  . Automatic implantable cardiac defibrillator- st judes 09/2007  . Seasonal allergic rhinitis   . GERD (gastroesophageal reflux disease)   .  Hyperglycemia   . Dizziness   . Anemia   . Cholecystitis, acute 07/2001    S/P laparoscopic cholecystectomy, intraoperative cholangiogram, and transcystic common bile duct exploration by Dr. Avel Peace on 07/14/2001  . Pelvic mass     Pelvic ultrasound done on 11/30/10 to evaluate abdominal pain showed,  positioned between the ovaries and contiguous with the ovaries, a complex mass demonstrating posterior acoustical enhancement and no definite intralesional flow with color Doppler exam measuring 5.2 x 3.1 x 5.5 cm. The appearance raised the possibility of a ruptured ovarian cyst with subsequent development of a hematoma.   . Fibroids   . 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 Herbert Moors to evaluate anemia; pathology showed a tubulovillous adenoma with no high grade dysplasia or malignancy identified.    . Abnormal thyroid blood test     Mildly elevated TSH, resolved.  Marland Kitchen HYPERGLYCEMIA 12/24/2008    Qualifier: Diagnosis of  By: Meredith Pel MD, Dorene Sorrow      Past Surgical History  Procedure Laterality Date  . Cardiac defibrillator placement  09/25/2007    Placed by Dr. Duke Salvia on 09/25/2007.  . Laparoscopic cholecystectomy  07/14/2001    S/P laparoscopic cholecystectomy, intraoperative cholangiogram, and transcystic common bile duct exploration by Dr. Avel Peace on 07/14/2001  . Convergent ablation  03/07/2011    Patient underwent Convergent Procedure at [facility] on 03/07/2011.  . Atrial ablation surgery  08/10/2010    ablation  done at [facility]  . Atrial ablation surgery  03/2009    ablation at [facility]  . Atrial ablation surgery  01/2008    ablation at [facility]  . Flexible sigmoidoscopy  05/26/2011    Procedure: FLEXIBLE SIGMOIDOSCOPY;  Surgeon: Shirley Friar, MD;  Location: [facility] ENDOSCOPY;  Service: Endoscopy;  Laterality: N/A;    Current Outpatient Prescriptions  Medication Sig Dispense Refill  . diltiazem  (CARDIZEM CD) 180 MG 24 hr capsule Take 180 mg by mouth daily.       Marland Kitchen dofetilide (TIKOSYN) 500 MCG capsule Take 500 mcg by mouth 2 (two) times daily.        . furosemide (LASIX) 20 MG tablet Take 1 tablet (20 mg total) by mouth daily.  90 tablet  3  . metoprolol (TOPROL-XL) 50 MG 24 hr tablet Take 50 mg by mouth 2 (two) times daily.       . Omega-3 Fatty Acids (FISH OIL) 1200 MG CAPS Take 1 capsule (1,200 mg total) by mouth daily.      . propranolol (INDERAL) 20 MG tablet Take 20 mg by mouth every 6 (six) hours as needed. As needed for increased heart rate      . sertraline (ZOLOFT) 50 MG tablet Take 50 mg by mouth daily.       Marland Kitchen warfarin (COUMADIN) 4 MG tablet Take 1 tablet by mouth daily except on [facility] 1&1/2 tablets.  30 tablet  2  . ferrous gluconate (FERGON) 240 (27 FE) MG tablet Take 240 mg by mouth daily.      . [DISCONTINUED] esomeprazole (NEXIUM) 20 MG capsule Take 1 capsule (20 mg total) by mouth daily.  30 capsule  0   No current facility-administered medications for this visit.    No Known Allergies  Review of Systems negative except from HPI and PMH  Physical Exam BP 99/69  Pulse 87  Ht 5\' 6"  (1.676 m)  Wt 182 lb 9.6 oz (82.827 kg)  BMI 29.49 kg/m2  LMP 12/24/2010 Well developed and well nourished in no acute distress HENT normal E scleral and icterus clear Neck Supple JVP flat; carotids brisk and full Clear to ausculation Regular rate and rhythm, no murmurs gallops or rub Soft with active bowel sounds No clubbing cyanosis Trace Edema Alert and oriented, grossly normal motor and sensory function Skin Warm and Dry  Electrocardiogram demonstrates atrial tachycardia/flutter with an atrial cycle length of  380 ms with intrisnsic conduction  Assessment and  Plan  HCM  ICD Atrial fib s/p ablation now with stable atrial tach;ycardia with plan to leave her on tikosyn Will reassess in 4 months

## 2013-01-05 ENCOUNTER — Ambulatory Visit: Payer: BC Managed Care – PPO

## 2013-01-12 ENCOUNTER — Ambulatory Visit (INDEPENDENT_AMBULATORY_CARE_PROVIDER_SITE_OTHER): Payer: BC Managed Care – PPO | Admitting: Pharmacist

## 2013-01-12 DIAGNOSIS — Z7901 Long term (current) use of anticoagulants: Secondary | ICD-10-CM

## 2013-01-12 LAB — POCT INR: INR: 3

## 2013-01-12 NOTE — Patient Instructions (Signed)
Patient instructed to take medications as defined in the Anti-coagulation Track section of this encounter.  Patient instructed to take today's dose.  Patient verbalized understanding of these instructions.    

## 2013-01-12 NOTE — Progress Notes (Signed)
Anti-Coagulation Progress Note  [name] is a 47 y.o. female who is currently on an anti-coagulation regimen.    RECENT RESULTS: Recent results are below, the most recent result is correlated with a dose of 30 mg. per week: Lab Results  Component Value Date   INR 3.0 01/12/2013   INR 2.60 12/08/2012   INR 4.80 11/24/2012    ANTI-COAG DOSE: Anticoagulation Dose Instructions as of 01/12/2013     [name] Tue Wed Thu Fri Sat   New Dose 4 mg 4 mg 4 mg 4 mg 4 mg 4 mg 4 mg       ANTICOAG SUMMARY: Anticoagulation Episode Summary   Current INR goal 2.0-3.0  Next INR check 02/16/2013  INR from last check 3.0 (01/12/2013)  Weekly max dose   Target end date Indefinite  [facility]  Preferred lab   Send INR reminders to ANTICOAG IMP   Indications  Long-term (current) use of anticoagulants [V58.61] A-fib (Resolved) [427.31]        Comments       Anticoagulation Care Providers   Provider Role Specialty Phone number   Farley Ly, MD  Internal Medicine [phone]      ANTICOAG TODAY: Anticoagulation Summary as of 01/12/2013   INR goal 2.0-3.0  Selected INR 3.0 (01/12/2013)  Next INR check 02/16/2013  Target end date Indefinite   Indications  Long-term (current) use of anticoagulants [V58.61] A-fib (Resolved) [427.31]      Anticoagulation Episode Summary   [facility] check location [facility]   Preferred lab    Send INR reminders to ANTICOAG IMP   Comments     Anticoagulation Care Providers   Provider Role Specialty Phone number   Farley Ly, MD  Internal Medicine [phone]      PATIENT INSTRUCTIONS: Patient Instructions  Patient instructed to take medications as defined in the Anti-coagulation Track section of this encounter.  Patient instructed to take today's dose.  Patient verbalized understanding of these instructions.       FOLLOW-UP Return in 5 weeks (on 02/16/2013) for Follow up INR at 4:30PM.  Hulen Luster, III Pharm.D.,  CACP

## 2013-01-16 ENCOUNTER — Ambulatory Visit (HOSPITAL_COMMUNITY)
Admission: RE | Admit: 2013-01-16 | Discharge: 2013-01-16 | Disposition: A | Discharge status: Home / Self Care | Payer: BC Managed Care – PPO | Source: Ambulatory Visit | Attending: Internal Medicine | Admitting: Internal Medicine

## 2013-01-16 ENCOUNTER — Encounter (HOSPITAL_COMMUNITY): Payer: Self-pay

## 2013-01-16 ENCOUNTER — Inpatient Hospital Stay (HOSPITAL_COMMUNITY)
Admission: AD | Admit: 2013-01-16 | Discharge: 2013-01-18 | DRG: 883 | Disposition: A | Discharge status: Home / Self Care | Payer: BC Managed Care – PPO | Source: Ambulatory Visit | Attending: Internal Medicine | Admitting: Internal Medicine

## 2013-01-16 ENCOUNTER — Other Ambulatory Visit: Payer: Self-pay | Admitting: Internal Medicine

## 2013-01-16 ENCOUNTER — Ambulatory Visit (INDEPENDENT_AMBULATORY_CARE_PROVIDER_SITE_OTHER): Payer: BC Managed Care – PPO | Admitting: Internal Medicine

## 2013-01-16 ENCOUNTER — Encounter: Payer: Self-pay | Admitting: Internal Medicine

## 2013-01-16 VITALS — BP 112/83 | HR 100 | Temp 97.1°F | Ht 66.0 in | Wt 181.0 lb

## 2013-01-16 DIAGNOSIS — J309 Allergic rhinitis, unspecified: Secondary | ICD-10-CM | POA: Diagnosis present

## 2013-01-16 DIAGNOSIS — R1032 Left lower quadrant pain: Secondary | ICD-10-CM

## 2013-01-16 DIAGNOSIS — Z79899 Other long term (current) drug therapy: Secondary | ICD-10-CM

## 2013-01-16 DIAGNOSIS — R1033 Periumbilical pain: Secondary | ICD-10-CM

## 2013-01-16 DIAGNOSIS — Z860100 Personal history of colon polyps, unspecified: Secondary | ICD-10-CM

## 2013-01-16 DIAGNOSIS — Z95810 Presence of automatic (implantable) cardiac defibrillator: Secondary | ICD-10-CM

## 2013-01-16 DIAGNOSIS — I421 Obstructive hypertrophic cardiomyopathy: Secondary | ICD-10-CM | POA: Diagnosis present

## 2013-01-16 DIAGNOSIS — D649 Anemia, unspecified: Secondary | ICD-10-CM

## 2013-01-16 DIAGNOSIS — I4892 Unspecified atrial flutter: Secondary | ICD-10-CM

## 2013-01-16 DIAGNOSIS — K3580 Unspecified acute appendicitis: Principal | ICD-10-CM | POA: Diagnosis present

## 2013-01-16 DIAGNOSIS — R52 Pain, unspecified: Secondary | ICD-10-CM

## 2013-01-16 DIAGNOSIS — D259 Leiomyoma of uterus, unspecified: Secondary | ICD-10-CM | POA: Diagnosis present

## 2013-01-16 DIAGNOSIS — K219 Gastro-esophageal reflux disease without esophagitis: Secondary | ICD-10-CM | POA: Diagnosis present

## 2013-01-16 DIAGNOSIS — Z7901 Long term (current) use of anticoagulants: Secondary | ICD-10-CM

## 2013-01-16 DIAGNOSIS — I5032 Chronic diastolic (congestive) heart failure: Secondary | ICD-10-CM

## 2013-01-16 DIAGNOSIS — I4891 Unspecified atrial fibrillation: Secondary | ICD-10-CM | POA: Diagnosis present

## 2013-01-16 DIAGNOSIS — K3533 Acute appendicitis with perforation and localized peritonitis, with abscess: Secondary | ICD-10-CM

## 2013-01-16 LAB — URINALYSIS, ROUTINE W REFLEX MICROSCOPIC
Glucose, UA: NEGATIVE mg/dL
Hgb urine dipstick: NEGATIVE
Nitrite: NEGATIVE
Specific Gravity, Urine: 1.022 (ref 1.005–1.030)
pH: 7.5 (ref 5.0–8.0)

## 2013-01-16 LAB — COMPLETE METABOLIC PANEL WITH GFR
ALT: 16 U/L (ref 0–35)
AST: 21 U/L (ref 0–37)
BUN: 13 mg/dL (ref 6–23)
CO2: 28 mEq/L (ref 19–32)
Calcium: 10 mg/dL (ref 8.4–10.5)
Chloride: 105 mEq/L (ref 96–112)
Creat: 0.78 mg/dL (ref 0.50–1.10)
GFR, Est African American: >89 mL/min
Total Bilirubin: 0.5 mg/dL (ref 0.3–1.2)

## 2013-01-16 LAB — CBC WITH DIFFERENTIAL/PLATELET
Hemoglobin: 14.7 g/dL (ref 12.0–15.0)
Lymphocytes Relative: 11 % — ABNORMAL LOW (ref 12–46)
Lymphs Abs: 1.1 10*3/uL (ref 0.7–4.0)
Monocytes Relative: 7 % (ref 3–12)
Neutro Abs: 8.3 10*3/uL — ABNORMAL HIGH (ref 1.7–7.7)
Neutrophils Relative %: 82 % — ABNORMAL HIGH (ref 43–77)
Platelets: 268 10*3/uL (ref 150–400)
RBC: 4.7 MIL/uL (ref 3.87–5.11)
WBC: 10.2 10*3/uL (ref 4.0–10.5)

## 2013-01-16 LAB — URINALYSIS, MICROSCOPIC ONLY
Casts: NONE SEEN
Crystals: NONE SEEN

## 2013-01-16 LAB — LIPASE: Lipase: 33 U/L (ref 11–59)

## 2013-01-16 MED ORDER — IOHEXOL 300 MG/ML  SOLN
100.0000 mL | Freq: Once | INTRAMUSCULAR | Status: AC | PRN
  Administered 2013-01-16: 100 mL via INTRAVENOUS

## 2013-01-16 MED ORDER — DOFETILIDE 500 MCG PO CAPS
500.0000 ug | ORAL_CAPSULE | Freq: Two times a day (BID) | ORAL | Status: DC
  Administered 2013-01-16 – 2013-01-18 (×4): 500 ug via ORAL
  Filled 2013-01-16 (×7): qty 1

## 2013-01-16 MED ORDER — SODIUM CHLORIDE 0.9 % IV SOLN
INTRAVENOUS | Status: DC
  Administered 2013-01-16: 20:00:00 via INTRAVENOUS

## 2013-01-16 MED ORDER — PIPERACILLIN-TAZOBACTAM 3.375 G IVPB
3.3750 g | Freq: Three times a day (TID) | INTRAVENOUS | Status: DC
  Administered 2013-01-17 (×2): 3.375 g via INTRAVENOUS
  Filled 2013-01-16 (×3): qty 50

## 2013-01-16 MED ORDER — METOPROLOL SUCCINATE ER 50 MG PO TB24
50.0000 mg | ORAL_TABLET | Freq: Two times a day (BID) | ORAL | Status: DC
  Administered 2013-01-16 – 2013-01-18 (×4): 50 mg via ORAL
  Filled 2013-01-16 (×7): qty 1

## 2013-01-16 MED ORDER — PIPERACILLIN-TAZOBACTAM 3.375 G IVPB 30 MIN
3.3750 g | Freq: Once | INTRAVENOUS | Status: AC
  Administered 2013-01-16: 3.375 g via INTRAVENOUS
  Filled 2013-01-16: qty 50

## 2013-01-16 MED ORDER — PROMETHAZINE HCL 12.5 MG PO TABS
12.5000 mg | ORAL_TABLET | Freq: Once | ORAL | Status: AC
  Administered 2013-01-16: 25 mg via ORAL

## 2013-01-16 MED ORDER — FUROSEMIDE 10 MG/ML IJ SOLN
20.0000 mg | Freq: Once | INTRAMUSCULAR | Status: AC
  Administered 2013-01-16: 20 mg via INTRAVENOUS
  Filled 2013-01-16: qty 2

## 2013-01-16 MED ORDER — VITAMIN K1 10 MG/ML IJ SOLN
10.0000 mg | Freq: Once | INTRAVENOUS | Status: AC
  Administered 2013-01-16: 10 mg via INTRAVENOUS
  Filled 2013-01-16: qty 1

## 2013-01-16 MED ORDER — DILTIAZEM HCL ER COATED BEADS 180 MG PO CP24
180.0000 mg | ORAL_CAPSULE | Freq: Every day | ORAL | Status: DC
  Administered 2013-01-16 – 2013-01-18 (×3): 180 mg via ORAL
  Filled 2013-01-16 (×4): qty 1

## 2013-01-16 MED ORDER — SODIUM CHLORIDE 0.9 % IJ SOLN
3.0000 mL | Freq: Two times a day (BID) | INTRAMUSCULAR | Status: DC
  Administered 2013-01-16: 3 mL via INTRAVENOUS

## 2013-01-16 NOTE — Progress Notes (Signed)
Attempted to page concerning call from radiology with regards to CT abdomen results.  Appears could be acute appendicitis.  Will continue to follow up [name]

## 2013-01-16 NOTE — Progress Notes (Signed)
Patient ID: [name], female   DOB: 03/31/1966, 47 y.o.   MRN: [id number] Patient examined and I agree with Dr. Dwain Sarna. First unit of FFP going in.  Plan laparoscopic appendectomy once coagulopathy corrected. Procedure, risks, and benefits discussed. She is agreeable. [name], MD, MPH, FACS Pager: [phone]

## 2013-01-16 NOTE — Progress Notes (Signed)
ANTIBIOTIC CONSULT NOTE - INITIAL  Pharmacy Consult for Zosyn Indication: acute appendicitis  No Known Allergies  Patient Measurements:  Weight 82.1 kg on 01/16/13 Height 168 cm on 01/16/13 Vital Signs: Temp: 98.3 F (36.8 C) (08/08 1720) Temp src: Oral (08/08 1720) BP: 96/74 mmHg (08/08 1720) Pulse Rate: 96 (08/08 1720)  Labs:  Recent Labs  01/16/13 1153  WBC 10.2  HGB 14.7  PLT 268  CREATININE 0.78   Microbiology: No results found for this or any previous visit (from the past 720 hour(s)).  Medications:  Anti-infectives   None     Assessment: 43 YOF admitted with acute abdominal pain found to have acute appendicitis by exam and CT scan to start IV Zosyn therapy and possible laparoscopic appendectomy. WBC 10.2, Afebrile. No current cultures. SCr 0.78/estCrCl >128ml/min.   Goal of Therapy:  Clinical resolution of infection  Plan:  1. Zosyn 3.375g IV q8h- 1st dose over , rest over 4 hours. 2. Follow-up surgical plans and duration of therapy.   Link Snuffer, PharmD, BCPS Clinical Pharmacist [phone] 01/16/2013,5:52 PM

## 2013-01-16 NOTE — H&P (Signed)
INTERNAL MEDICINE TEACHING SERVICE Attending Admission Note  Date: 01/16/2013  Patient name: CHARRON COULTAS  Medical record number: [id number]  Date of birth: 1966-02-23    I have seen and evaluated Levada Dy and discussed their care with the Residency Team.  47 yr. Old WF w/ hx HOCM s/p AICD, Afib s/p ablation on coumadin, presented with abdominal pain. Exam shows evidence of guarding and rebound tenderness.  She has signs concerning for peritonitis. Her abdomen is not distended and I do not suspect perforation at this time.  CT abd/pelvis with evidence of acute appendicitis.  [facility] has been consulted. [place] cardiology was notified about admission. She is hemodynamically stable. Will need appendectomy in next 24 hrs.  Her INR will be reversed with vit K and FFP. Will carefully watch volume status. She can be given her oral diltizem, tikosyn, and metoprolol tonight and NPO p MN. Check INR in the AM.   Jonah Blue, DO 8/8/20148:29 PM

## 2013-01-16 NOTE — Progress Notes (Signed)
Clinic Attending Note I saw and examined patient in the clinic along with resident Dr. Garald Braver.  Patient presented with complaint of mid abdominal pain that began around 1 AM this morning; she also reported pain in her right lower abdomen.  Exam was notable for right lower quadrant tenderness with rebound.  I agree with the plan to admit to inpatient service for stat CT scan and surgery consult.  I discussed the patient with admitting resident Dr. Janalyn Harder and inpatient attending Dr. Thomos Lemons.

## 2013-01-16 NOTE — Consult Note (Signed)
Reason for Consult:abdominal pain Referring Physician: Dr Oda Cogan is an 47 y.o. female.  HPI: 89 yof who at 1 cm today began to have periumbilical abdominal pain.  This has continued throughout day and worsened.  She says it hurts most when pressed in RLQ.  She has some nausea, no emesis.  She had nl bms today.  Denies fevers.  Denies prior episodes. No difficulty with urination.  She has undergone prior colonoscopy a couple years ago by Dr Evette Cristal that showed a polyp but was otherwise normal.  She did have postpolypectomy bleed requiring a clip to be placed.  She has undergone ct that shows appendicitis She has long cardiac history mostly as detailed below.  She is able to walk a couple flights of stairs without much sob on her lasix.  She does have multiple short episodes daily of atrial tachycardia which resolve spontaneously.  She also has history in 2012 of multiple ICD shocks.  Past Medical History  Diagnosis Date  . Hypertrophic obstructive cardiomyopathy (HOCM)     TEE 09/02/2007 showed moderate to severe left ventricular hypertrophy with an interventricular septal dimension of 1.5 cm and posterior wall dimension of 7 cm; there was normal LV systolic function and estimated EF of approximately 60%; there did not appear to be any obvious outflow tract obstruction.   . Atrial fibrillation     S/P radiofrequency ablation by Dr. Clydie Braun at [facility] on 02/09/2008 and again on 03/31/2009; S/P ablation at [facility] by Dr. Marchia Bond on 08/10/2010, followed by recurrent atrial flutter/fibrillation.  Cardiac cath done on 02/19/2011 at [facility] showed no significant coronary disease.  Patient underwent Convergent Procedure at [facility] on 03/07/2011.  . Atrial tachycardia     Status post convergent ablation [facility] October 2012  . Ventricular tachycardia     Hx of nonsustained ventricular tachycardia, S/P hospitalization in March 2009; S/P ICD placement by Dr. Duke Salvia on  09/25/2007.  Marland Kitchen Anxiety     Acute stress reaction to multiple ICD shocks  . Automatic implantable cardiac defibrillator- st judes 09/2007  . Seasonal allergic rhinitis   . GERD (gastroesophageal reflux disease)   . Hyperglycemia   . Dizziness   . Anemia   . Cholecystitis, acute 07/2001    S/P laparoscopic cholecystectomy, intraoperative cholangiogram, and transcystic common bile duct exploration by Dr. Avel Peace on 07/14/2001  . Pelvic mass     Pelvic ultrasound done on 11/30/10 to evaluate abdominal pain showed,  positioned between the ovaries and contiguous with the ovaries, a complex mass demonstrating posterior acoustical enhancement and no definite intralesional flow with color Doppler exam measuring 5.2 x 3.1 x 5.5 cm. The appearance raised the possibility of a ruptured ovarian cyst with subsequent development of a hematoma.   . Fibroids   . 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 Herbert Moors to evaluate anemia; pathology showed a tubulovillous adenoma with no high grade dysplasia or malignancy identified.    . Abnormal thyroid blood test     Mildly elevated TSH, resolved.  Marland Kitchen HYPERGLYCEMIA 12/24/2008    Qualifier: Diagnosis of  By: Meredith Pel MD, Dorene Sorrow      Past Surgical History  Procedure Laterality Date  . Cardiac defibrillator placement  09/25/2007    Placed by Dr. Duke Salvia on 09/25/2007.  . Laparoscopic cholecystectomy  07/14/2001    S/P laparoscopic cholecystectomy, intraoperative cholangiogram, and  transcystic common bile duct exploration by Dr. Avel Peace on 07/14/2001  . Convergent ablation  03/07/2011    Patient underwent Convergent Procedure at [facility] on 03/07/2011.  . Atrial ablation surgery  08/10/2010    ablation  done at [facility]  . Atrial ablation surgery  03/2009    ablation at [facility]  . Atrial ablation surgery  01/2008    ablation at [facility]  . Flexible sigmoidoscopy  05/26/2011     Procedure: FLEXIBLE SIGMOIDOSCOPY;  Surgeon: Shirley Friar, MD;  Location: [facility] ENDOSCOPY;  Service: Endoscopy;  Laterality: N/A;    Family History  Problem Relation Age of Onset  . Hypertrophic cardiomyopathy Sister   . Hypertrophic cardiomyopathy Cousin     3 cousins on mother's side Deceased  . Throat cancer Father   . Breast cancer Paternal Aunt   . Coronary artery disease Father 34    S/P CABG  . Colon polyps Mother   . Ovarian cancer Neg Hx     great aunt had  . Colon cancer Neg Hx   . Lung cancer Neg Hx   . Hypertrophic cardiomyopathy Mother   . Hypertrophic cardiomyopathy Maternal Grandmother   . Hypertrophic cardiomyopathy      great uncle    Social History:  reports that she has never smoked. She has never used smokeless tobacco. She reports that she does not drink alcohol or use illicit drugs.  Allergies: No Known Allergies  Medications: I have reviewed the patient's current medications.  Results for orders placed in visit on 01/16/13 (from the past 48 hour(s))  URINALYSIS, MICROSCOPIC ONLY     Status: Abnormal   Collection Time    01/16/13 12:25 PM      Result Value Range   Squamous Epithelial / LPF FEW  RARE   Crystals NONE SEEN  NONE SEEN   Casts NONE SEEN  NONE SEEN   WBC, UA 3-6 (*) <3 WBC/hpf   RBC / HPF 0-2  <3 RBC/hpf   Bacteria, UA FEW (*) RARE    Ct Abdomen Pelvis W Contrast  01/16/2013   *RADIOLOGY REPORT*  Clinical Data: Abdominal pain  CT ABDOMEN AND PELVIS WITH CONTRAST  Technique:  Multidetector CT imaging of the abdomen and pelvis was performed following the standard protocol during bolus administration of intravenous contrast.  Contrast: OMNIPAQUE IOHEXOL 300 MG/ML  SOLN  Comparison: 11/17/2010  Findings: The appendix is thickened, ill-defined, and low density. There is no air or contrast within the appendix.  There is also marked wall thickening of the adjacent cecum.  Slight wall thickening of the very distal ileum.  Small inflammatory  nodes are seen in the cecal mesentery.  No extraluminal bowel gas to suggest perforation.  No abscess.  These findings are worrisome for acute appendicitis.  Several low density lesions are scattered throughout the liver without change compatible with cysts.  There is one ill-defined hypodensity in the right lobe measuring 1.5 cm that is not consistent with a simple cyst.  On delayed phase images, it fills in with contrast and becomes homogeneous with the remainder of the liver.  This likely represents a hemangioma or an abnormality related to phase of contrast.  Spleen, pancreas, adrenal glands are within normal limits.  Stable kidneys.  No evidence of disproportionate dilatation of bowel to suggest obstruction.  Bladder, uterus, and adnexa are within normal limits.  Advanced degenerative disc disease at L4-5.  No vertebral body compression deformity.  IMPRESSION: Findings are worrisome for acute appendicitis.  There is wall thickening of the appendix and cecum.  Other inflammatory disorders and neoplasm are in the differential diagnosis.  Critical Value/emergent results were called by telephone at the time of interpretation on 01/16/2013 at 1625 hours to [place], RN of the 2000 unit, who verbally acknowledged these results.   Original Report Authenticated By: Jolaine Click, M.D.    Review of Systems  Constitutional: Negative for fever, chills and weight loss.  Respiratory: Negative for cough and shortness of breath.   Cardiovascular: Positive for palpitations. Negative for chest pain.  Gastrointestinal: Positive for nausea and abdominal pain. Negative for vomiting and blood in stool.  Genitourinary: Negative for dysuria, urgency and frequency.   Blood pressure 96/74, pulse 96, temperature 98.3 F (36.8 C), temperature source Oral, resp. rate 18, last menstrual period 12/24/2010, SpO2 98.00%. Physical Exam  Vitals reviewed. Constitutional: She appears well-developed and well-nourished.  Eyes: No scleral  icterus.  Cardiovascular: Normal rate and regular rhythm.   Respiratory: Effort normal. She has no wheezes. She has no rales.  GI: Soft. Bowel sounds are normal. She exhibits no distension. There is tenderness in the right lower quadrant and periumbilical area. There is tenderness at McBurney's point. No hernia.    Assessment/Plan: Acute appendicitis  She does appear to have acute appendicitis by exam and ct scan.  i don't think this is another process.  We discussed laparoscopic appendectomy as treatment.  However her INR is elevated and she has some cardiac risk to this.  I recommended cardiology involvement now and I have talked to them.  I think involving them for perioperative management would be wise given risk of possible arrythmias that have been difficult in past.  I think before we give her volume load and reverse her with ffp would like to have their input.  We could conceivably reverse her slowly (? In monitored setting) and do this even in the morning as long as she is on abx.  Will wait for their input and then proceed.  I have discussed the case with my partner Dr Janee Morn.  Talah Cookston 01/16/2013, 5:36 PM
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
## 2013-01-17 ENCOUNTER — Encounter (HOSPITAL_COMMUNITY): Payer: Self-pay

## 2013-01-17 ENCOUNTER — Inpatient Hospital Stay (HOSPITAL_COMMUNITY): Payer: BC Managed Care – PPO

## 2013-01-17 ENCOUNTER — Encounter (HOSPITAL_COMMUNITY)
Admission: AD | Disposition: A | Discharge status: Home / Self Care | Payer: Self-pay | Source: Ambulatory Visit | Attending: Internal Medicine

## 2013-01-17 ENCOUNTER — Encounter (HOSPITAL_COMMUNITY): Payer: Self-pay | Admitting: Anesthesiology

## 2013-01-17 ENCOUNTER — Inpatient Hospital Stay (HOSPITAL_COMMUNITY): Payer: BC Managed Care – PPO | Admitting: Anesthesiology

## 2013-01-17 DIAGNOSIS — K3580 Unspecified acute appendicitis: Secondary | ICD-10-CM | POA: Diagnosis present

## 2013-01-17 DIAGNOSIS — R1033 Periumbilical pain: Secondary | ICD-10-CM | POA: Insufficient documentation

## 2013-01-17 DIAGNOSIS — I4891 Unspecified atrial fibrillation: Secondary | ICD-10-CM

## 2013-01-17 HISTORY — DX: Unspecified acute appendicitis: K35.80

## 2013-01-17 HISTORY — PX: LAPAROSCOPIC APPENDECTOMY: SHX408

## 2013-01-17 LAB — CBC
HCT: 36.9 % (ref 36.0–46.0)
Hemoglobin: 12.3 g/dL (ref 12.0–15.0)
RDW: 13.1 % (ref 11.5–15.5)
WBC: 5.6 10*3/uL (ref 4.0–10.5)

## 2013-01-17 LAB — TROPONIN I
Troponin I: <0.3 ng/mL (ref <0.30)
Troponin I: <0.3 ng/mL (ref <0.30)

## 2013-01-17 LAB — BASIC METABOLIC PANEL
Chloride: 106 mEq/L (ref 96–112)
Creatinine, Ser: 0.88 mg/dL (ref 0.50–1.10)
GFR calc Af Amer: 88 mL/min — ABNORMAL LOW (ref >90)
Potassium: 3.7 mEq/L (ref 3.5–5.1)

## 2013-01-17 LAB — PROTIME-INR
INR: 1.31 (ref 0.00–1.49)
Prothrombin Time: 16 seconds — ABNORMAL HIGH (ref 11.6–15.2)

## 2013-01-17 LAB — TYPE AND SCREEN: ABO/RH(D): A POS

## 2013-01-17 SURGERY — APPENDECTOMY, LAPAROSCOPIC
Anesthesia: General | Wound class: Contaminated

## 2013-01-17 MED ORDER — NEOSTIGMINE METHYLSULFATE 1 MG/ML IJ SOLN
INTRAMUSCULAR | Status: DC | PRN
  Administered 2013-01-17: 3 mg via INTRAVENOUS

## 2013-01-17 MED ORDER — LACTATED RINGERS IV SOLN
INTRAVENOUS | Status: DC | PRN
  Administered 2013-01-17: 10:00:00 via INTRAVENOUS

## 2013-01-17 MED ORDER — SODIUM CHLORIDE 0.9 % IR SOLN
Status: DC | PRN
  Administered 2013-01-17: 1000 mL

## 2013-01-17 MED ORDER — MIDAZOLAM HCL 5 MG/5ML IJ SOLN
INTRAMUSCULAR | Status: DC | PRN
  Administered 2013-01-17: 2 mg via INTRAVENOUS

## 2013-01-17 MED ORDER — PHENYLEPHRINE HCL 10 MG/ML IJ SOLN
INTRAMUSCULAR | Status: DC | PRN
  Administered 2013-01-17 (×5): 80 ug via INTRAVENOUS

## 2013-01-17 MED ORDER — HYDROMORPHONE HCL PF 1 MG/ML IJ SOLN
INTRAMUSCULAR | Status: AC
  Administered 2013-01-17: 0.5 mg via INTRAVENOUS
  Filled 2013-01-17: qty 1

## 2013-01-17 MED ORDER — HEMOSTATIC AGENTS (NO CHARGE) OPTIME
TOPICAL | Status: DC | PRN
  Administered 2013-01-17: 1 via TOPICAL

## 2013-01-17 MED ORDER — LIDOCAINE HCL (CARDIAC) 20 MG/ML IV SOLN
INTRAVENOUS | Status: DC | PRN
  Administered 2013-01-17: 50 mg via INTRAVENOUS

## 2013-01-17 MED ORDER — POTASSIUM CHLORIDE CRYS ER 20 MEQ PO TBCR: 40.0000 meq | EXTENDED_RELEASE_TABLET | Freq: Once | ORAL | Status: AC

## 2013-01-17 MED ORDER — HYDROMORPHONE HCL PF 1 MG/ML IJ SOLN
0.2500 mg | INTRAMUSCULAR | Status: DC | PRN
  Administered 2013-01-17 – 2013-01-18 (×2): 0.5 mg via INTRAVENOUS
  Filled 2013-01-17: qty 1

## 2013-01-17 MED ORDER — PROPOFOL 10 MG/ML IV BOLUS
INTRAVENOUS | Status: DC | PRN
  Administered 2013-01-17: 200 mg via INTRAVENOUS

## 2013-01-17 MED ORDER — ONDANSETRON HCL 4 MG/2ML IJ SOLN: 4.0000 mg | Freq: Once | INTRAMUSCULAR | Status: AC | PRN

## 2013-01-17 MED ORDER — WARFARIN SODIUM 4 MG PO TABS
4.0000 mg | ORAL_TABLET | Freq: Every day | ORAL | Status: DC
  Filled 2013-01-17: qty 1

## 2013-01-17 MED ORDER — SERTRALINE HCL 50 MG PO TABS
50.0000 mg | ORAL_TABLET | Freq: Every day | ORAL | Status: DC
  Administered 2013-01-17 – 2013-01-18 (×2): 50 mg via ORAL
  Filled 2013-01-17 (×2): qty 1

## 2013-01-17 MED ORDER — POTASSIUM CHLORIDE CRYS ER 20 MEQ PO TBCR
EXTENDED_RELEASE_TABLET | ORAL | Status: AC
  Administered 2013-01-17: 40 meq via ORAL
  Filled 2013-01-17: qty 2

## 2013-01-17 MED ORDER — ROCURONIUM BROMIDE 100 MG/10ML IV SOLN
INTRAVENOUS | Status: DC | PRN
  Administered 2013-01-17: 40 mg via INTRAVENOUS

## 2013-01-17 MED ORDER — WARFARIN - PHYSICIAN DOSING INPATIENT: Freq: Every day | Status: DC

## 2013-01-17 MED ORDER — FENTANYL CITRATE 0.05 MG/ML IJ SOLN
INTRAMUSCULAR | Status: DC | PRN
  Administered 2013-01-17 (×2): 50 ug via INTRAVENOUS

## 2013-01-17 MED ORDER — ONDANSETRON HCL 4 MG/2ML IJ SOLN
INTRAMUSCULAR | Status: DC | PRN
  Administered 2013-01-17: 4 mg via INTRAVENOUS

## 2013-01-17 MED ORDER — POTASSIUM CHLORIDE CRYS ER 20 MEQ PO TBCR: 40.0000 meq | EXTENDED_RELEASE_TABLET | Freq: Once | ORAL | Status: DC

## 2013-01-17 MED ORDER — LACTATED RINGERS IV SOLN
INTRAVENOUS | Status: DC
  Administered 2013-01-17: 09:00:00 via INTRAVENOUS

## 2013-01-17 MED ORDER — PIPERACILLIN-TAZOBACTAM 3.375 G IVPB
3.3750 g | Freq: Three times a day (TID) | INTRAVENOUS | Status: AC
  Administered 2013-01-17 – 2013-01-18 (×3): 3.375 g via INTRAVENOUS
  Filled 2013-01-17 (×3): qty 50

## 2013-01-17 MED ORDER — GLYCOPYRROLATE 0.2 MG/ML IJ SOLN
INTRAMUSCULAR | Status: DC | PRN
  Administered 2013-01-17: 0.4 mg via INTRAVENOUS

## 2013-01-17 MED ORDER — BUPIVACAINE-EPINEPHRINE 0.25% -1:200000 IJ SOLN
INTRAMUSCULAR | Status: DC | PRN
  Administered 2013-01-17: 20 mL

## 2013-01-17 MED ORDER — BUPIVACAINE-EPINEPHRINE PF 0.25-1:200000 % IJ SOLN
INTRAMUSCULAR | Status: AC
  Filled 2013-01-17: qty 30

## 2013-01-17 MED ORDER — DEXAMETHASONE SODIUM PHOSPHATE 10 MG/ML IJ SOLN
INTRAMUSCULAR | Status: DC | PRN
  Administered 2013-01-17: 8 mg via INTRAVENOUS

## 2013-01-17 SURGICAL SUPPLY — 44 items
ADH SKN CLS APL DERMABOND .7 (GAUZE/BANDAGES/DRESSINGS) ×1
APPLIER CLIP 5 13 M/L LIGAMAX5 (MISCELLANEOUS) ×2
APPLIER CLIP ROT 10 11.4 M/L (STAPLE)
APR CLP MED LRG 11.4X10 (STAPLE)
APR CLP MED LRG 5 ANG JAW (MISCELLANEOUS) ×1
BAG SPEC RTRVL LRG 6X4 10 (ENDOMECHANICALS) ×1
CANISTER SUCTION 2500CC (MISCELLANEOUS) ×2 IMPLANT
CHLORAPREP W/TINT 26ML (MISCELLANEOUS) ×2 IMPLANT
CLIP APPLIE 5 13 M/L LIGAMAX5 (MISCELLANEOUS) IMPLANT
CLIP APPLIE ROT 10 11.4 M/L (STAPLE) IMPLANT
CLOTH BEACON ORANGE TIMEOUT ST (SAFETY) ×2 IMPLANT
COVER SURGICAL LIGHT HANDLE (MISCELLANEOUS) ×2 IMPLANT
CUTTER FLEX LINEAR 45M (STAPLE) ×2 IMPLANT
DERMABOND ADVANCED (GAUZE/BANDAGES/DRESSINGS) ×1
DERMABOND ADVANCED .7 DNX12 (GAUZE/BANDAGES/DRESSINGS) ×1 IMPLANT
DEVICE TROCAR PUNCTURE CLOSURE (ENDOMECHANICALS) ×1 IMPLANT
ELECT REM PT RETURN 9FT ADLT (ELECTROSURGICAL) ×2
ELECTRODE REM PT RTRN 9FT ADLT (ELECTROSURGICAL) ×1 IMPLANT
GLOVE BIO SURGEON STRL SZ7 (GLOVE) ×2 IMPLANT
GLOVE BIOGEL PI IND STRL 7.5 (GLOVE) ×1 IMPLANT
GLOVE BIOGEL PI INDICATOR 7.5 (GLOVE) ×1
GOWN STRL NON-REIN LRG LVL3 (GOWN DISPOSABLE) ×6 IMPLANT
KIT BASIN OR (CUSTOM PROCEDURE TRAY) ×2 IMPLANT
KIT ROOM TURNOVER OR (KITS) ×2 IMPLANT
NS IRRIG 1000ML POUR BTL (IV SOLUTION) ×2 IMPLANT
PAD ARMBOARD 7.5X6 YLW CONV (MISCELLANEOUS) ×4 IMPLANT
POUCH SPECIMEN RETRIEVAL 10MM (ENDOMECHANICALS) ×2 IMPLANT
RELOAD 45 VASCULAR/THIN (ENDOMECHANICALS) IMPLANT
RELOAD STAPLE 45 2.5 WHT GRN (ENDOMECHANICALS) ×1 IMPLANT
RELOAD STAPLE 45 3.5 BLU ETS (ENDOMECHANICALS) IMPLANT
RELOAD STAPLE TA45 3.5 REG BLU (ENDOMECHANICALS) ×2 IMPLANT
SCALPEL HARMONIC ACE (MISCELLANEOUS) ×2 IMPLANT
SCISSORS LAP 5X35 DISP (ENDOMECHANICALS) ×1 IMPLANT
SET IRRIG TUBING LAPAROSCOPIC (IRRIGATION / IRRIGATOR) ×2 IMPLANT
SLEEVE ENDOPATH XCEL 5M (ENDOMECHANICALS) ×2 IMPLANT
SPECIMEN JAR SMALL (MISCELLANEOUS) ×2 IMPLANT
SUT MNCRL AB 4-0 PS2 18 (SUTURE) ×2 IMPLANT
SUT VIC AB 0 CTB1 27 (SUTURE) ×1 IMPLANT
TOWEL OR 17X24 6PK STRL BLUE (TOWEL DISPOSABLE) ×2 IMPLANT
TOWEL OR 17X26 10 PK STRL BLUE (TOWEL DISPOSABLE) ×2 IMPLANT
TRAY FOLEY CATH 16FRSI W/METER (SET/KITS/TRAYS/PACK) ×2 IMPLANT
TRAY LAPAROSCOPIC (CUSTOM PROCEDURE TRAY) ×2 IMPLANT
TROCAR XCEL BLUNT TIP 100MML (ENDOMECHANICALS) ×2 IMPLANT
TROCAR XCEL NON-BLD 5MMX100MML (ENDOMECHANICALS) ×2 IMPLANT

## 2013-01-17 NOTE — Progress Notes (Signed)
Pt ambulated 300 ft in hall with nurse and tolerated well.

## 2013-01-17 NOTE — Progress Notes (Signed)
Day of Surgery  Subjective: inr normal, feels same  Objective: Vital signs in last 24 hours: Temp:  [97 F (36.1 C)-98.4 F (36.9 C)] 98 F (36.7 C) (08/09 0748) Pulse Rate:  [73-105] 105 (08/09 0748) Resp:  [14-20] 18 (08/09 0748) BP: (92-112)/(59-89) 106/73 mmHg (08/09 0748) SpO2:  [96 %-98 %] 98 % (08/09 0748) Weight:  [179 lb 6.4 oz (81.375 kg)-181 lb (82.101 kg)] 179 lb 6.4 oz (81.375 kg) (08/09 0515) Last BM Date: 01/15/13  Intake/Output from previous day: 08/08 0701 - 08/09 0700 In: 1057.5 [I.V.:25; Blood:1032.5] Out: 2050 [Urine:2050] Intake/Output this shift:    Abdominal tenderness unchanged  Lab Results:   Recent Labs  01/16/13 1153 01/17/13 0515  WBC 10.2 5.6  HGB 14.7 12.3  HCT 42.4 36.9  PLT 268 190   BMET  Recent Labs  01/16/13 1153 01/17/13 0515  NA 141 144  K 4.5 3.7  CL 105 106  CO2 28 31  GLUCOSE 97 92  BUN 13 10  CREATININE 0.78 0.88  CALCIUM 10.0 9.9   PT/INR  Recent Labs  01/16/13 1153 01/17/13 0515  LABPROT 25.8* 16.0*  INR 2.45* 1.31   ABG No results found for this basename: PHART, PCO2, PO2, HCO3,  in the last 72 hours  Studies/Results: Ct Abdomen Pelvis W Contrast  01/16/2013   *RADIOLOGY REPORT*  Clinical Data: Abdominal pain  CT ABDOMEN AND PELVIS WITH CONTRAST  Technique:  Multidetector CT imaging of the abdomen and pelvis was performed following the standard protocol during bolus administration of intravenous contrast.  Contrast: OMNIPAQUE IOHEXOL 300 MG/ML  SOLN  Comparison: 11/17/2010  Findings: The appendix is thickened, ill-defined, and low density. There is no air or contrast within the appendix.  There is also marked wall thickening of the adjacent cecum.  Slight wall thickening of the very distal ileum.  Small inflammatory nodes are seen in the cecal mesentery.  No extraluminal bowel gas to suggest perforation.  No abscess.  These findings are worrisome for acute appendicitis.  Several low density lesions are  scattered throughout the liver without change compatible with cysts.  There is one ill-defined hypodensity in the right lobe measuring 1.5 cm that is not consistent with a simple cyst.  On delayed phase images, it fills in with contrast and becomes homogeneous with the remainder of the liver.  This likely represents a hemangioma or an abnormality related to phase of contrast.  Spleen, pancreas, adrenal glands are within normal limits.  Stable kidneys.  No evidence of disproportionate dilatation of bowel to suggest obstruction.  Bladder, uterus, and adnexa are within normal limits.  Advanced degenerative disc disease at L4-5.  No vertebral body compression deformity.  IMPRESSION: Findings are worrisome for acute appendicitis.  There is wall thickening of the appendix and cecum.  Other inflammatory disorders and neoplasm are in the differential diagnosis.  Critical Value/emergent results were called by telephone at the time of interpretation on 01/16/2013 at 1625 hours to [place], RN of the 2000 unit, who verbally acknowledged these results.   Original Report Authenticated By: Jolaine Click, M.D.    Anti-infectives: Anti-infectives   Start     Dose/Rate Route Frequency Ordered Stop   01/17/13 0200  piperacillin-tazobactam (ZOSYN) IVPB 3.375 g     3.375 g 12.5 mL/hr over 240 Minutes Intravenous Every 8 hours 01/16/13 1809     01/16/13 1815  piperacillin-tazobactam (ZOSYN) IVPB 3.375 g     3.375 g 100 mL/hr over 30 Minutes Intravenous  Once 01/16/13  1809 01/16/13 1930      Assessment/Plan: Appendicitis  inr normal, plan lap appy today  [facility] 01/17/2013

## 2013-01-17 NOTE — Op Note (Signed)
Preoperative diagnosis: Acute appendicitis Postoperative diagnosis: Acute appendicitis Procedure: Laparoscopic appendectomy Surgeon: Dr Harden Mo EBL: minimal Anesthesia: General Drains: none Specimen: appendix to pathology Complications: None Sponge and needle count correct at completion Disposition to recovery stable  Indications: This is a 47 year old female with a history of atrial fibrillation who presents with acute appendicitis. She is on Coumadin and her INR was elevated. She was placed on antibiotics and reversed with fresh frozen plasma. The following morning her INR was normal. We discussed proceeding to the operating room for laparoscopic appendectomy.  Procedure: After informed consent was obtained the patient was taken to the operative room. She was on a regimen of Zosyn on the floor. Sequential compression devices were placed on her legs. She was then placed under general anesthesia without complication. A Foley catheter was placed. Her abdomen was prepped and draped in the standard sterile surgical fashion. A surgical timeout was then performed.  She had a prior cholecystectomy so I went a little bit below her umbilicus. I infiltrated Marcaine. I grasped her fascia and incised it sharply. Her peritoneum was entered bluntly. I placed a 0 Vicryl pursestring suture to the fascia. I then inserted a Hassan trocar and insufflated the abdomen to 15 mm mercury pressure. I then inserted 2 further 5 mm trocars in the suprapubic region as well as the left lower quadrant under vision. Her omentum was adherent to the right lower quadrant and I rotated this out of the way. She was then noted to have acute suppurative appendicitis. This was somewhat difficult due to the amount of inflammation. This area and used a decent amount during the dissection as well. I divided the appendiceal mesentery with the harmonic scalpel taking care to note the location of the cecum and the small bowel. I  eventually was able to encircle the base of the appendix with a GIA stapler and divide it. I did include a small piece of the cecum as it was inflamed all the way to the base. The staple line was clean. I then placed the appendix in the bag and removed from the umbilicus. I then irrigated. There were several small areas that were oozing and I controlled with a combination of some clips. I did actually elect to place a piece of Surgicel as this was not perforated and was concerned about placing her back on her Coumadin in the right lower quadrant. I evacuated all the fluid. I then removed the [facility] trocar and tied down the pursestring. The defect was still somewhat therefore placed another suture. I also used the Endo Close device in place an additional 0 Vicryl to completely obliterate this defect. I then removed my remaining trocars and desufflated the abdomen. I closed these with 4 Monocryl Dermabond. She was extubated in the operating room and transferred to recovery stable. She may begin coumadin tomorrow.

## 2013-01-17 NOTE — Progress Notes (Signed)
Pt ambulated in hallway 200 ft. Pt tolerated ambulation. V/S stable.   Sahily Biddle,MSN,RN

## 2013-01-17 NOTE — Progress Notes (Signed)
  Subjective:    Patient ID: [name], female    DOB: 1966-02-02, 47 y.o.   MRN: [id number]  HPI [name] is a 47 year old woman with PMH of hypertrophic obstructive cardiomyopathy, s/p ICD implant, A. Fib on long term anticoagulation with warfarin, and diastolic CHF who presents for evaluation of acute abdominal pain. The pain woke her up at 1:30 in the morning, eased up so she could sleep some but returned with increased intensity and is now persistent. The pain is localized to the periumbilical area, described as a burning sensation that sometimes is sharp, and 7/10 in intensity. The pain is worse with walking and improves somewhat with rest. She has nausea and has unable to eat since 9PM. She had one bowel movement prior to this visit that was nonbloody and not tary dark. She had abdominal pain in the past when she had lower GI bleed but this pain is different.  She has history of ovarian cyst that burst with last pelvic ultrasound in December 2012. She is postmenopausal.    Review of Systems  Constitutional: Positive for appetite change. Negative for fever, chills, diaphoresis, activity change and fatigue.  Respiratory: Negative for shortness of breath.   Cardiovascular: Positive for palpitations. Negative for chest pain.  Gastrointestinal: Positive for nausea and abdominal pain. Negative for vomiting, diarrhea, constipation, blood in stool, abdominal distention and rectal pain.  Genitourinary: Negative for dysuria, urgency, frequency, hematuria, flank pain, vaginal bleeding, difficulty urinating and pelvic pain.  Musculoskeletal: Negative for back pain.  Skin: Negative for color change, pallor, rash and wound.  Neurological: Negative for dizziness and headaches.  Hematological: Negative for adenopathy.  Psychiatric/Behavioral: Negative.  Negative for behavioral problems and agitation.       Objective:   Physical Exam  Nursing note and vitals reviewed. Constitutional: She is  oriented to person, place, and time. She appears well-developed and well-nourished. She appears distressed.  In mild distress secondary to pain  HENT:  Head: Normocephalic and atraumatic.  Eyes: Conjunctivae are normal. No scleral icterus.  Cardiovascular: Regular rhythm.   Mild tachycardia  Pulmonary/Chest: Effort normal and breath sounds normal. No respiratory distress. She has no wheezes. She has no rales.  Abdominal: Soft. She exhibits no distension and no mass. There is tenderness. There is rebound. There is no guarding.  Hypoactive bowel sounds, TTP in RLQ specifically at McBurney's point.   Musculoskeletal: She exhibits no edema and no tenderness.  Neurological: She is alert and oriented to person, place, and time.  Skin: Skin is warm and dry. No rash noted. She is not diaphoretic. No erythema. No pallor.  Psychiatric: She has a normal mood and affect. Her behavior is normal.          Assessment & Plan:

## 2013-01-17 NOTE — Assessment & Plan Note (Addendum)
History and physical exam findings with acute apendicitis as the most likely etiology for her periumbilical pain, TTP at McBurney's point, and rebound tenderness. Ordered STA CBC, CMP, lipase.  Last INR of 3 last week.  Phenergan 12.5-25mg  once for nausea.  Pt to be admitted to telemetry with STAT CT abdomen and surgical consult as indicated.  Pt discussed with Dr. Meredith Pel who also examined the patient.

## 2013-01-17 NOTE — Anesthesia Postprocedure Evaluation (Signed)
  Anesthesia Post-op Note  Patient: [name]  Procedure(s) Performed: Procedure(s): APPENDECTOMY LAPAROSCOPIC (N/A)  Patient Location: PACU  Anesthesia Type:General  Level of Consciousness: awake, oriented, sedated and patient cooperative  Airway and Oxygen Therapy: Patient Spontanous Breathing  Post-op Pain: mild  Post-op Assessment: Post-op Vital signs reviewed, Patient's Cardiovascular Status Stable, Respiratory Function Stable, Patent Airway, No signs of Nausea or vomiting and Pain level controlled  Post-op Vital Signs: stable  Complications: No apparent anesthesia complications

## 2013-01-17 NOTE — Preoperative (Signed)
Beta Blockers   Reason not to administer Beta Blockers:Not Applicable 

## 2013-01-17 NOTE — Progress Notes (Signed)
  Date: 01/17/2013  Patient name: [name]  Medical record number: [id number]  Date of birth: 12/23/1965   This patient has been seen and the plan of care was discussed with the house staff. Please see their note for complete details. I concur with their findings with the following additions/corrections:  Feels well. Not currently in pain. S/P lap appendectomy.  Start coumadin tomorrow night. She will likely be slow to become therapeutic due to recent vit K. Abdomen is soft, nontender, +BS. No distention. Incision sites c/d/i.  Jonah Blue, DO 01/17/2013, 8:53 PM

## 2013-01-17 NOTE — Progress Notes (Addendum)
S: Went to assess patient's volume status after receiving 2nd bag of FFP. Patient feeling fine, no change in abdominal pain. Denies shortness of breath. Resting comfortably.  O:  Filed Vitals:   01/17/13 0015  BP: 93/68  Pulse: 90  Temp: 97.6 F (36.4 C)  Resp: 20   Physical Exam  Constitutional: No distress.  Resting comfortably in bed  Cardiovascular: Normal rate, regular rhythm and normal heart sounds.  Exam reveals no gallop and no friction rub.   No murmur heard. No lower extremity edema  Pulmonary/Chest: Effort normal and breath sounds normal. No respiratory distress. She has no wheezes. She has no rales. She exhibits no tenderness.  Abdominal: Soft. She exhibits no distension. There is tenderness (RLQ, stable).  Musculoskeletal: She exhibits no edema.  Skin: She is not diaphoretic.   A/P: No signs or symptoms of volume overload. - Safe to hang 3rd bag FFP - Will check INR 1 hour after it is complete  Vivi Barrack, MD [phone]

## 2013-01-17 NOTE — Transfer of Care (Signed)
Immediate Anesthesia Transfer of Care Note  Patient: Magdalynn L Gaines  Procedure(s) Performed: Procedure(s): APPENDECTOMY LAPAROSCOPIC (N/A)  Patient Location: PACU  Anesthesia Type:General  Level of Consciousness: awake, alert  and oriented  Airway & Oxygen Therapy: Patient Spontanous Breathing and Patient connected to nasal cannula oxygen  Post-op Assessment: Report given to PACU RN and Post -op Vital signs reviewed and stable  Post vital signs: Reviewed and stable  Complications: No apparent anesthesia complications

## 2013-01-17 NOTE — Progress Notes (Addendum)
Subjective:  Pt in OR in AM. Pt seen in PM after surgery. She is doing well. Mild abdominal pain with no nausea or vomiting. No fever, chills, dyspnea, or CP.   Objective: Vital signs in last 24 hours: Filed Vitals:   01/17/13 1330 01/17/13 1412 01/17/13 1916 01/17/13 2120  BP: 97/55 102/79 100/68 116/85  Pulse: 146 101 73 87  Temp:  98.7 F (37.1 C) 97.8 F (36.6 C) 97.9 F (36.6 C)  TempSrc:  Oral Axillary Oral  Resp: 14 18 20 20   Height:      Weight:      SpO2: 98% 100% 94% 93%   Weight change:   Intake/Output Summary (Last 24 hours) at 01/17/13 2310 Last data filed at 01/17/13 1300  Gross per 24 hour  Intake 1832.5 ml  Output   1290 ml  Net  542.5 ml   General: alert, no acute distress HEENT: EOMI  Neck: supple Lungs: clear to ascultation bilaterally, normal work of respiration, no wheezes, rales, ronchi Heart: irregular rhythm, normal rate Abdomen: Decreased bowel sounds, mild abdominal tenderness, no guarding or rebound    Extremities: no  edema Neurologic: alert & oriented X3   Lab Results: Basic Metabolic Panel:  Recent Labs Lab 01/16/13 1153 01/17/13 0515  NA 141 144  K 4.5 3.7  CL 105 106  CO2 28 31  GLUCOSE 97 92  BUN 13 10  CREATININE 0.78 0.88  CALCIUM 10.0 9.9  MG  --  2.2   Liver Function Tests:  Recent Labs Lab 01/16/13 1153  AST 21  ALT 16  ALKPHOS 99  BILITOT 0.5  PROT 7.1  ALBUMIN 4.1    Recent Labs Lab 01/16/13 1153  LIPASE 33   No results found for this basename: AMMONIA,  in the last 168 hours CBC:  Recent Labs Lab 01/16/13 1153 01/17/13 0515  WBC 10.2 5.6  NEUTROABS 8.3*  --   HGB 14.7 12.3  HCT 42.4 36.9  MCV 90.2 90.9  PLT 268 190   Cardiac Enzymes:  Recent Labs Lab 01/17/13 0515 01/17/13 1712  TROPONINI <0.30 <0.30   BNP: No results found for this basename: PROBNP,  in the last 168 hours D-Dimer: No results found for this basename: DDIMER,  in the last 168 hours CBG: No results found for  this basename: GLUCAP,  in the last 168 hours Hemoglobin A1C: No results found for this basename: HGBA1C,  in the last 168 hours Fasting Lipid Panel: No results found for this basename: CHOL, HDL, LDLCALC, TRIG, CHOLHDL, LDLDIRECT,  in the last 168 hours Thyroid Function Tests: No results found for this basename: TSH, T4TOTAL, FREET4, T3FREE, THYROIDAB,  in the last 168 hours Coagulation:  Recent Labs Lab 01/12/13 1628 01/16/13 1153 01/17/13 0515  LABPROT  --  25.8* 16.0*  INR 3.0 2.45* 1.31   Anemia Panel: No results found for this basename: VITAMINB12, FOLATE, FERRITIN, TIBC, IRON, RETICCTPCT,  in the last 168 hours Urine Drug Screen: Drugs of Abuse  No results found for this basename: labopia, cocainscrnur, labbenz, amphetmu, thcu, labbarb    Alcohol Level: No results found for this basename: ETH,  in the last 168 hours Urinalysis:  Recent Labs Lab 01/16/13 1225  COLORURINE YELLOW  LABSPEC 1.022  PHURINE 7.5  GLUCOSEU NEG  HGBUR NEG  BILIRUBINUR NEG  KETONESUR NEG  PROTEINUR NEG  UROBILINOGEN 1  NITRITE NEG  LEUKOCYTESUR SMALL*   Misc. Labs:   Micro Results: Recent Results (from the past 240 hour(s))  SURGICAL PCR SCREEN     Status: None   Collection Time    01/17/13  2:19 AM      Result Value Range Status   MRSA, PCR NEGATIVE  NEGATIVE Final   Staphylococcus aureus NEGATIVE  NEGATIVE Final   Comment:            The Xpert SA Assay (FDA     approved for NASAL specimens     in patients over 63 years of age),     is one component of     a comprehensive surveillance     program.  Test performance has     been validated by [employer] for patients greater     than or equal to 5 year old.     It is not intended     to diagnose infection nor to     guide or monitor treatment.   Studies/Results: Ct Abdomen Pelvis W Contrast  01/16/2013   *RADIOLOGY REPORT*  Clinical Data: Abdominal pain  CT ABDOMEN AND PELVIS WITH CONTRAST  Technique:  Multidetector  CT imaging of the abdomen and pelvis was performed following the standard protocol during bolus administration of intravenous contrast.  Contrast: OMNIPAQUE IOHEXOL 300 MG/ML  SOLN  Comparison: 11/17/2010  Findings: The appendix is thickened, ill-defined, and low density. There is no air or contrast within the appendix.  There is also marked wall thickening of the adjacent cecum.  Slight wall thickening of the very distal ileum.  Small inflammatory nodes are seen in the cecal mesentery.  No extraluminal bowel gas to suggest perforation.  No abscess.  These findings are worrisome for acute appendicitis.  Several low density lesions are scattered throughout the liver without change compatible with cysts.  There is one ill-defined hypodensity in the right lobe measuring 1.5 cm that is not consistent with a simple cyst.  On delayed phase images, it fills in with contrast and becomes homogeneous with the remainder of the liver.  This likely represents a hemangioma or an abnormality related to phase of contrast.  Spleen, pancreas, adrenal glands are within normal limits.  Stable kidneys.  No evidence of disproportionate dilatation of bowel to suggest obstruction.  Bladder, uterus, and adnexa are within normal limits.  Advanced degenerative disc disease at L4-5.  No vertebral body compression deformity.  IMPRESSION: Findings are worrisome for acute appendicitis.  There is wall thickening of the appendix and cecum.  Other inflammatory disorders and neoplasm are in the differential diagnosis.  Critical Value/emergent results were called by telephone at the time of interpretation on 01/16/2013 at 1625 hours to [place], RN of the 2000 unit, who verbally acknowledged these results.   Original Report Authenticated By: Jolaine Click, M.D.   Medications: I have reviewed the patient's current medications. Scheduled Meds: . diltiazem  180 mg Oral Daily  . dofetilide  500 mcg Oral BID  . metoprolol succinate  50 mg Oral BID    . piperacillin-tazobactam (ZOSYN)  IV  3.375 g Intravenous Q8H  . sertraline  50 mg Oral Daily  . sodium chloride  3 mL Intravenous Q12H  . [START ON 01/18/2013] warfarin  4 mg Oral q1800  . Warfarin - Physician Dosing Inpatient   Does not apply q1800   Continuous Infusions: . sodium chloride 75 mL/hr at 01/16/13 1930   PRN Meds:.HYDROmorphone (DILAUDID) injection, ondansetron (ZOFRAN) IV Assessment/Plan: Active Problems:   Appendicitis, acute  The patient is a 47 yo woman, history of HOCM,  afib, presenting with abdominal pain and found to have acute cholecystitis.   # Acute appendicitis - s/p appendectomy 8/8, tolerated procedure well    -Clear liquid diet -IV fluids   -IV zosyn per pharmacy -Dilaudid PRN pain -Zofran PRN nausea -Obtain CBC, BMP -Ambulate with nurse   # Afib - history of afib, s/p ablation x3 attempts, on coumadin,  S/p FFP and vita K  -Obtain daily INR --->1.31 -coumadin per pharmacy, to start night of August 10 -continue metoprolol, diltiazem, defetilide (DO NOT STOP) -Obtain 12-lead EKG daily if QTc >500 call MD - Keep  K>4  On defetilide, replace as needed  # HOCM - history of HOCM. AICD in place. Pt follows with Dr. Graciela Husbands in the outpatient setting.  -Continue  metoprolol, diltiazem, defetilide -hold lasix PPx: SCD  Dispo: Disposition is deferred at this time, awaiting improvement of current medical problems.  Anticipated discharge in approximately  day(s).   The patient does have a current PCP Farley Ly, MD) and does need an [facility] hospital follow-up appointment after discharge.  The patient does have transportation limitations that hinder transportation to clinic appointments.  .Services Needed at time of discharge: Y = Yes, Blank = No PT:   OT:   RN:   Equipment:   Other:     LOS: 1 day   Otis Brace, MD 01/17/2013, 11:10 PM

## 2013-01-17 NOTE — Anesthesia Preprocedure Evaluation (Signed)
Anesthesia Evaluation  Patient identified by MRN, date of birth, ID band Patient awake    Reviewed: Allergy & Precautions, H&P , NPO status , Patient's Chart, lab work & pertinent test results  Airway Mallampati: I TM Distance: >3 FB Neck ROM: full    Dental   Pulmonary          Cardiovascular + dysrhythmias Atrial Fibrillation + Cardiac Defibrillator     Neuro/Psych    GI/Hepatic GERD-  ,  Endo/Other    Renal/GU      Musculoskeletal   Abdominal   Peds  Hematology   Anesthesia Other Findings   Reproductive/Obstetrics                           Anesthesia Physical Anesthesia Plan  ASA: III  Anesthesia Plan: General   Post-op Pain Management:    Induction: Intravenous  Airway Management Planned: Oral ETT  Additional Equipment:   Intra-op Plan:   Post-operative Plan: Extubation in OR  Informed Consent: I have reviewed the patients History and Physical, chart, labs and discussed the procedure including the risks, benefits and alternatives for the proposed anesthesia with the patient or authorized representative who has indicated his/her understanding and acceptance.     Plan Discussed with: CRNA, Anesthesiologist and Surgeon  Anesthesia Plan Comments:         Anesthesia Quick Evaluation

## 2013-01-18 ENCOUNTER — Other Ambulatory Visit: Payer: Self-pay

## 2013-01-18 DIAGNOSIS — R1033 Periumbilical pain: Secondary | ICD-10-CM

## 2013-01-18 DIAGNOSIS — K352 Acute appendicitis with generalized peritonitis, without abscess: Secondary | ICD-10-CM

## 2013-01-18 DIAGNOSIS — Z7901 Long term (current) use of anticoagulants: Secondary | ICD-10-CM

## 2013-01-18 DIAGNOSIS — D649 Anemia, unspecified: Secondary | ICD-10-CM

## 2013-01-18 DIAGNOSIS — R52 Pain, unspecified: Secondary | ICD-10-CM

## 2013-01-18 LAB — BASIC METABOLIC PANEL
BUN: 9 mg/dL (ref 6–23)
Calcium: 9.8 mg/dL (ref 8.4–10.5)
Chloride: 103 mEq/L (ref 96–112)
Creatinine, Ser: 0.77 mg/dL (ref 0.50–1.10)
GFR calc Af Amer: >90 mL/min (ref >90)

## 2013-01-18 LAB — PREPARE FRESH FROZEN PLASMA: Unit division: 0

## 2013-01-18 LAB — CBC
HCT: 39.9 % (ref 36.0–46.0)
MCHC: 32.6 g/dL (ref 30.0–36.0)
Platelets: 214 10*3/uL (ref 150–400)
RDW: 13.1 % (ref 11.5–15.5)

## 2013-01-18 LAB — PROTIME-INR: INR: 1.12 (ref 0.00–1.49)

## 2013-01-18 MED ORDER — POTASSIUM CHLORIDE CRYS ER 20 MEQ PO TBCR
40.0000 meq | EXTENDED_RELEASE_TABLET | Freq: Every day | ORAL | Status: DC
  Administered 2013-01-18: 40 meq via ORAL
  Filled 2013-01-18: qty 2

## 2013-01-18 MED ORDER — HYDROCODONE-ACETAMINOPHEN 5-325 MG PO TABS: 1.0000 | ORAL_TABLET | ORAL | Status: DC | PRN

## 2013-01-18 MED ORDER — WARFARIN SODIUM 4 MG PO TABS: 4.0000 mg | ORAL_TABLET | Freq: Every day | ORAL | Status: DC

## 2013-01-18 MED ORDER — WARFARIN - PHARMACIST DOSING INPATIENT: Freq: Every day | Status: DC

## 2013-01-18 MED ORDER — HYDROCODONE-ACETAMINOPHEN 5-325 MG PO TABS
1.0000 | ORAL_TABLET | ORAL | Status: DC | PRN
  Administered 2013-01-18: 1 via ORAL
  Filled 2013-01-18: qty 1

## 2013-01-18 NOTE — Progress Notes (Signed)
1 Day Post-Op   Assessment: s/p Procedure(s): APPENDECTOMY LAPAROSCOPIC Patient Active Problem List   Diagnosis Date Noted  . Abdominal pain, acute, periumbilical 01/17/2013  . Appendicitis, acute 01/17/2013  . Chronic diastolic heart failure 05/20/2012  . GI bleed 05/25/2011  . Adenomatous rectal polyp 05/22/2011  . Anemia 04/27/2011  . Pelvic mass 12/27/2010  . Long-term (current) use of anticoagulants 06/27/2010  . ANXIETY 03/01/2010  . Hypertrophic obstructive cardiomyopathy 09/03/2008  . VENTRICULAR TACHYCARDIA 09/03/2008  . Atrial Tachycardia 09/03/2008  . Iron deficiency anemia 09/01/2008  . Atrial fibrillation 09/30/2007  . ALLERGIC RHINITIS, SEASONAL 09/30/2007  . GERD 09/30/2007  . ICD-St.Jude 09/25/2007    Doing well s/p lap appy for acute appendicitis  Plan: Advance diet Discharge later today if diet tolerated  Subjective: Feels much better than preop and feels able to go home. Pain controlled, no nausea, tolerated clear liquids  Objective: Vital signs in last 24 hours: Temp:  [97.4 F (36.3 C)-98.7 F (37.1 C)] 97.4 F (36.3 C) (08/10 0452) Pulse Rate:  [73-146] 84 (08/10 0452) Resp:  [12-20] 12 (08/10 0452) BP: (97-124)/(54-96) 101/69 mmHg (08/10 0452) SpO2:  [93 %-100 %] 97 % (08/10 0452) Weight:  [185 lb 6.5 oz (84.1 kg)] 185 lb 6.5 oz (84.1 kg) (08/10 0452)   Intake/Output from previous day: 08/09 0701 - 08/10 0700 In: 1325 [P.O.:75; I.V.:1250] Out: 340 [Urine:300; Blood:40]  General appearance: alert, cooperative and no distress Resp: clear to auscultation bilaterally GI: Soft, mild incisional tender, othewise benign  Incision: healing well, mild ecchymosis at umbilical incision  Lab Results:   Recent Labs  01/17/13 0515 01/18/13 0517  WBC 5.6 14.6*  HGB 12.3 13.0  HCT 36.9 39.9  PLT 190 214   BMET  Recent Labs  01/17/13 0515 01/18/13 0517  NA 144 142  K 3.7 4.1  CL 106 103  CO2 31 27  GLUCOSE 92 133*  BUN 10 9   CREATININE 0.88 0.77  CALCIUM 9.9 9.8    MEDS, Scheduled . diltiazem  180 mg Oral Daily  . dofetilide  500 mcg Oral BID  . metoprolol succinate  50 mg Oral BID  . piperacillin-tazobactam (ZOSYN)  IV  3.375 g Intravenous Q8H  . potassium chloride  40 mEq Oral Daily  . sertraline  50 mg Oral Daily  . sodium chloride  3 mL Intravenous Q12H  . warfarin  4 mg Oral q1800  . Warfarin - Physician Dosing Inpatient   Does not apply q1800    Studies/Results: Ct Abdomen Pelvis W Contrast  01/16/2013   *RADIOLOGY REPORT*  Clinical Data: Abdominal pain  CT ABDOMEN AND PELVIS WITH CONTRAST  Technique:  Multidetector CT imaging of the abdomen and pelvis was performed following the standard protocol during bolus administration of intravenous contrast.  Contrast: OMNIPAQUE IOHEXOL 300 MG/ML  SOLN  Comparison: 11/17/2010  Findings: The appendix is thickened, ill-defined, and low density. There is no air or contrast within the appendix.  There is also marked wall thickening of the adjacent cecum.  Slight wall thickening of the very distal ileum.  Small inflammatory nodes are seen in the cecal mesentery.  No extraluminal bowel gas to suggest perforation.  No abscess.  These findings are worrisome for acute appendicitis.  Several low density lesions are scattered throughout the liver without change compatible with cysts.  There is one ill-defined hypodensity in the right lobe measuring 1.5 cm that is not consistent with a simple cyst.  On delayed phase images, it fills in  with contrast and becomes homogeneous with the remainder of the liver.  This likely represents a hemangioma or an abnormality related to phase of contrast.  Spleen, pancreas, adrenal glands are within normal limits.  Stable kidneys.  No evidence of disproportionate dilatation of bowel to suggest obstruction.  Bladder, uterus, and adnexa are within normal limits.  Advanced degenerative disc disease at L4-5.  No vertebral body compression  deformity.  IMPRESSION: Findings are worrisome for acute appendicitis.  There is wall thickening of the appendix and cecum.  Other inflammatory disorders and neoplasm are in the differential diagnosis.  Critical Value/emergent results were called by telephone at the time of interpretation on 01/16/2013 at 1625 hours to [place], RN of the 2000 unit, who verbally acknowledged these results.   Original Report Authenticated By: Jolaine Click, M.D.      LOS: 2 days     Currie Paris, MD, [facility], [place] [phone]   01/18/2013 8:06 AM

## 2013-01-18 NOTE — Progress Notes (Addendum)
Subjective: Patient doing ok. No c/o, tolerates her diet well. No acute events overnight.  Plan to resume coumadin tonight.   Objective: Vital signs in last 24 hours: Filed Vitals:   01/17/13 1916 01/17/13 2120 01/18/13 0100 01/18/13 0452  BP: 100/68 116/85  101/69  Pulse: 73 87 100 84  Temp: 97.8 F (36.6 C) 97.9 F (36.6 C)  97.4 F (36.3 C)  TempSrc: Axillary Oral  Oral  Resp: 20 20  12   Height:      Weight:    185 lb 6.5 oz (84.1 kg)  SpO2: 94% 93%  97%   Weight change: 6 lb 0.1 oz (2.725 kg)  Intake/Output Summary (Last 24 hours) at 01/18/13 0739 Last data filed at 01/18/13 0700  Gross per 24 hour  Intake   1325 ml  Output    340 ml  Net    985 ml  General: NAD Neck: supple, full ROM, no thyromegaly, no JVD, and no carotid bruits.  Lungs: normal respiratory effort, no accessory muscle use, normal breath sounds, no crackles, and no wheezes. Heart: normal rate, regular rhythm, no murmur, no gallop, and no rub.  Abdomen: soft, mild tender on RLQ, normal bowel sounds, no distention, no guarding, no rebound tenderness, no hepatomegaly, and no splenomegaly.  Msk: no joint swelling, no joint warmth, and no redness over joints.  Pulses: 2+ DP/PT pulses bilaterally Extremities: No cyanosis, clubbing, edema Neurologic: alert & oriented X3, cranial nerves II-XII intact, strength normal in all extremities, sensation intact to light touch, and gait normal.  Skin: turgor normal and no rashes.  Psych: Oriented X3, memory intact for recent and remote, normally interactive, good eye contact, not anxious appearing, and not depressed appearing.   Lab Results: Basic Metabolic Panel:  Recent Labs Lab 01/17/13 0515 01/18/13 0517  Abb Gobert 144 142  K 3.7 4.1  CL 106 103  CO2 31 27  GLUCOSE 92 133*  BUN 10 9  CREATININE 0.88 0.77  CALCIUM 9.9 9.8  MG 2.2 2.1   Liver Function Tests:  Recent Labs Lab 01/16/13 1153  AST 21  ALT 16  ALKPHOS 99  BILITOT 0.5  PROT 7.1    ALBUMIN 4.1    Recent Labs Lab 01/16/13 1153  LIPASE 33   CBC:  Recent Labs Lab 01/16/13 1153 01/17/13 0515 01/18/13 0517  WBC 10.2 5.6 14.6*  NEUTROABS 8.3*  --   --   HGB 14.7 12.3 13.0  HCT 42.4 36.9 39.9  MCV 90.2 90.9 93.0  PLT 268 190 214   Cardiac Enzymes:  Recent Labs Lab 01/17/13 0515 01/17/13 1712  TROPONINI <0.30 <0.30   Coagulation:  Recent Labs Lab 01/12/13 1628 01/16/13 1153 01/17/13 0515 01/18/13 0517  LABPROT  --  25.8* 16.0* 14.2  INR 3.0 2.45* 1.31 1.12   Urinalysis:  Recent Labs Lab 01/16/13 1225  COLORURINE YELLOW  LABSPEC 1.022  PHURINE 7.5  GLUCOSEU NEG  HGBUR NEG  BILIRUBINUR NEG  KETONESUR NEG  PROTEINUR NEG  UROBILINOGEN 1  NITRITE NEG  LEUKOCYTESUR SMALL*   Micro Results: Recent Results (from the past 240 hour(s))  SURGICAL PCR SCREEN     Status: None   Collection Time    01/17/13  2:19 AM      Result Value Range Status   MRSA, PCR NEGATIVE  NEGATIVE Final   Staphylococcus aureus NEGATIVE  NEGATIVE Final   Comment:            The Xpert SA Assay (FDA  approved for NASAL specimens     in patients over 31 years of age),     is one component of     a comprehensive surveillance     program.  Test performance has     been validated by [employer] for patients greater     than or equal to 41 year old.     It is not intended     to diagnose infection nor to     guide or monitor treatment.   Studies/Results: Ct Abdomen Pelvis W Contrast  01/16/2013   *RADIOLOGY REPORT*  Clinical Data: Abdominal pain  CT ABDOMEN AND PELVIS WITH CONTRAST  Technique:  Multidetector CT imaging of the abdomen and pelvis was performed following the standard protocol during bolus administration of intravenous contrast.  Contrast: OMNIPAQUE IOHEXOL 300 MG/ML  SOLN  Comparison: 11/17/2010  Findings: The appendix is thickened, ill-defined, and low density. There is no air or contrast within the appendix.  There is also marked wall  thickening of the adjacent cecum.  Slight wall thickening of the very distal ileum.  Small inflammatory nodes are seen in the cecal mesentery.  No extraluminal bowel gas to suggest perforation.  No abscess.  These findings are worrisome for acute appendicitis.  Several low density lesions are scattered throughout the liver without change compatible with cysts.  There is one ill-defined hypodensity in the right lobe measuring 1.5 cm that is not consistent with a simple cyst.  On delayed phase images, it fills in with contrast and becomes homogeneous with the remainder of the liver.  This likely represents a hemangioma or an abnormality related to phase of contrast.  Spleen, pancreas, adrenal glands are within normal limits.  Stable kidneys.  No evidence of disproportionate dilatation of bowel to suggest obstruction.  Bladder, uterus, and adnexa are within normal limits.  Advanced degenerative disc disease at L4-5.  No vertebral body compression deformity.  IMPRESSION: Findings are worrisome for acute appendicitis.  There is wall thickening of the appendix and cecum.  Other inflammatory disorders and neoplasm are in the differential diagnosis.  Critical Value/emergent results were called by telephone at the time of interpretation on 01/16/2013 at 1625 hours to [place], RN of the 2000 unit, who verbally acknowledged these results.   Original Report Authenticated By: Jolaine Click, M.D.   Medications: I have reviewed the patient's current medications. Scheduled Meds: . diltiazem  180 mg Oral Daily  . dofetilide  500 mcg Oral BID  . metoprolol succinate  50 mg Oral BID  . piperacillin-tazobactam (ZOSYN)  IV  3.375 g Intravenous Q8H  . potassium chloride  40 mEq Oral Daily  . sertraline  50 mg Oral Daily  . sodium chloride  3 mL Intravenous Q12H  . warfarin  4 mg Oral q1800  . Warfarin - Physician Dosing Inpatient   Does not apply q1800   Continuous Infusions: . sodium chloride 75 mL/hr at 01/18/13 0100    PRN Meds:.HYDROmorphone (DILAUDID) injection Assessment/Plan:  # Acute appendicitis - POD#1 appendectomy, tolerated clear liquid diet well.  - Appreciate Surgery input!! - Defer diet advancement to surgery team - D/C IVF - symptomatic management--4 doses dilaudid needed yesterday. No nausea. - ABX end time established by surgical team. - increase activity level as tol.   # Afib - history of afib, s/p ablation x3 attempts, on coumadin, S/p FFP and vita K   - INR daily - Continue metoprolol, diltiazem - pharmacy consult for coumadin--start  tonight.    CHA2ds2-vasC Score is 1--> adj. Stroke rate is 2.2% /year.  Likely does not need bridge  Will discuss with her cardiology team--LB card.   - Pharmacy consult for Tikosyn  EKG daily. QTc 488 today  Call MD for QTc >500  Keep K>=4 On defetilide, replace as needed    # HOCM - history of HOCM. AICD in place. Pt follows with Dr. Graciela Husbands in the outpatient setting.  -Continue metoprolol, diltiazem, defetilide  -hold lasix for now. EF 55% 06/13/12.   PPx: SCD/coumadin    Dispo: Disposition is deferred at this time, awaiting improvement of current medical problems.  Anticipated discharge in approximately 1 day(s).   The patient does have a current PCP Farley Ly, MD) and does need an [facility] hospital follow-up appointment after discharge.  The patient does not have transportation limitations that hinder transportation to clinic appointments.  .Services Needed at time of discharge: Y = Yes, Blank = No PT:   OT:   RN:   Equipment:   Other:     LOS: 2 days   Dede Query, MD 01/18/2013, 7:39 AM

## 2013-01-18 NOTE — Progress Notes (Signed)
ANTICOAGULATION CONSULT NOTE - Initial Consult  Pharmacy Consult for Warfarin Indication: atrial fibrillation  No Known Allergies  Patient Measurements: Height: 5\' 6"  (167.6 cm) Weight: 185 lb 6.5 oz (84.1 kg) IBW/kg (Calculated) : 59.3  Vital Signs: Temp: 97.4 F (36.3 C) (08/10 0452) Temp src: Oral (08/10 0452) BP: 101/69 mmHg (08/10 0452) Pulse Rate: 84 (08/10 0452)  Labs:  Recent Labs  01/16/13 1153 01/17/13 0515 01/17/13 1712 01/18/13 0517  HGB 14.7 12.3  --  13.0  HCT 42.4 36.9  --  39.9  PLT 268 190  --  214  LABPROT 25.8* 16.0*  --  14.2  INR 2.45* 1.31  --  1.12  CREATININE 0.78 0.88  --  0.77  TROPONINI  --  <0.30 <0.30  --     Estimated Creatinine Clearance: 92.9 ml/min (by C-G formula based on Cr of 0.77).   Medical History: Past Medical History  Diagnosis Date  . Hypertrophic obstructive cardiomyopathy (HOCM)     TEE 09/02/2007 showed moderate to severe left ventricular hypertrophy with an interventricular septal dimension of 1.5 cm and posterior wall dimension of 7 cm; there was normal LV systolic function and estimated EF of approximately 60%; there did not appear to be any obvious outflow tract obstruction.   . Atrial fibrillation     S/P radiofrequency ablation by Dr. Clydie Braun at [facility] on 02/09/2008 and again on 03/31/2009; S/P ablation at [facility] by Dr. Marchia Bond on 08/10/2010, followed by recurrent atrial flutter/fibrillation.  Cardiac cath done on 02/19/2011 at [facility] showed no significant coronary disease.  Patient underwent Convergent Procedure at [facility] on 03/07/2011.  . Atrial tachycardia     Status post convergent ablation [facility] October 2012  . Ventricular tachycardia     Hx of nonsustained ventricular tachycardia, S/P hospitalization in March 2009; S/P ICD placement by Dr. Duke Salvia on 09/25/2007.  Marland Kitchen Anxiety     Acute stress reaction to multiple ICD shocks  . Automatic implantable cardiac defibrillator- st judes  09/2007  . Seasonal allergic rhinitis   . GERD (gastroesophageal reflux disease)   . Hyperglycemia   . Dizziness   . Anemia   . Cholecystitis, acute 07/2001    S/P laparoscopic cholecystectomy, intraoperative cholangiogram, and transcystic common bile duct exploration by Dr. Avel Peace on 07/14/2001  . Pelvic mass     Pelvic ultrasound done on 11/30/10 to evaluate abdominal pain showed,  positioned between the ovaries and contiguous with the ovaries, a complex mass demonstrating posterior acoustical enhancement and no definite intralesional flow with color Doppler exam measuring 5.2 x 3.1 x 5.5 cm. The appearance raised the possibility of a ruptured ovarian cyst with subsequent development of a hematoma.   . Fibroids   . 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 Herbert Moors to evaluate anemia; pathology showed a tubulovillous adenoma with no high grade dysplasia or malignancy identified.    . Abnormal thyroid blood test     Mildly elevated TSH, resolved.  Marland Kitchen HYPERGLYCEMIA 12/24/2008    Qualifier: Diagnosis of  By: Meredith Pel MD, Dorene Sorrow      Medications:  Warfarin 4mg /day PTA, has been on hold for surgery   Assessment: 47 y/o F who had INR reversed with Vit K/FFP for surgery. To re-start warfarin per pharmacy tonight. INR is 1.12<1.31<2.45. CBC good. Renal function stable. No post-op complications noted.   Goal of Therapy:  INR 2-3 Monitor  platelets by anticoagulation protocol: Yes   Plan:  -Will re-start home regimen of warfarin 4mg /day for now -Daily PT/INR -Monitor for bleeding -Adjust home regimen as needed based on INR  Thank you for allowing me to take part in this patient's care,  Abran Duke, PharmD Clinical Pharmacist Phone: [phone] Pager: [phone] 01/18/2013 8:06 AM

## 2013-01-18 NOTE — Discharge Summary (Signed)
Name: [name] MRN: [id number] DOB: 1965/09/06 47 y.o. PCP: Farley Ly, MD  Date of Admission: 01/16/2013  4:37 PM Date of Discharge: 01/18/2013 Attending Physician: Jonah Blue, DO  Discharge Diagnosis: 1.  Principal Problem:   Appendicitis, acute  Discharge Medications:   Medication List         diltiazem 180 MG 24 hr capsule  Commonly known as:  CARDIZEM CD  Take 180 mg by mouth daily.     Fish Oil 1200 MG Caps  Take 1 capsule (1,200 mg total) by mouth daily.     furosemide 20 MG tablet  Commonly known as:  LASIX  Take 1 tablet (20 mg total) by mouth daily.     HYDROcodone-acetaminophen 5-325 MG per tablet  Commonly known as:  NORCO/VICODIN  Take 1 tablet by mouth every 4 (four) hours as needed for pain.     metoprolol succinate 50 MG 24 hr tablet  Commonly known as:  TOPROL-XL  Take 50 mg by mouth 2 (two) times daily.     sertraline 50 MG tablet  Commonly known as:  ZOLOFT  Take 50 mg by mouth daily.     SOY ISOFLAVONE PO  Take 80 mg by mouth 2 (two) times daily.     TIKOSYN 500 MCG capsule  Generic drug:  dofetilide  Take 500 mcg by mouth 2 (two) times daily.     warfarin 4 MG tablet  Commonly known as:  COUMADIN  Take 1 tablet (4 mg total) by mouth daily.        Disposition and follow-up:   [name] was discharged from [facility] in Stable condition.  At the hospital follow up visit please address:  1.  Post-appendectomy follow-up      Coumadin adjustment   2.  Labs / imaging needed at time of follow-up: PT/INR, CBC (WBC 14.6 on discharge), 12-lead EKG  3.  Pending labs/ test needing follow-up: surgical pathology report  # Please follow up with your surgeon as instructed # please follow up with Dr. Alexandria Lodge on Monday 01/19/13. Please call the clinic to set up appt in am. # please follow up with the clinic physician in one week. -call to set up appt.  # please take your coumadin 6 mg po tonight and  tomorrow night ( 08/10 and 01/19/13). And resume your home dose of 4 mg po daily starting on Tuesday. Once you see Dr. Alexandria Lodge, please follow his instructions.     Follow-up Appointments:     Follow-up Information   Follow up with [facility],MD, MD. Schedule an appointment as soon as possible for a visit in 2 weeks.   Contact information:   [address] [place] [address] [phone]       Follow up with Farley Ly, MD In 1 week.   Contact information:   [address] [place] [address] [phone]       Follow up with Janie Morning, Ricke Hey, [facility]. (tomorrow on Monday 01/19/13)    Contact information:   - - - - [place] [address] [phone]       Discharge Instructions:  Future Appointments Provider Department Dept Phone   02/23/2013 10:30 AM Lauro Franklin, FNP [facility] [phone]      Consultations: Treatment Team:  Md Montez Morita, MD [facility], MD  Procedures Performed:  Ct Abdomen Pelvis W Contrast  01/16/2013   *RADIOLOGY REPORT*  Clinical Data: Abdominal pain  CT ABDOMEN  AND PELVIS WITH CONTRAST  Technique:  Multidetector CT imaging of the abdomen and pelvis was performed following the standard protocol during bolus administration of intravenous contrast.  Contrast: OMNIPAQUE IOHEXOL 300 MG/ML  SOLN  Comparison: 11/17/2010  Findings: The appendix is thickened, ill-defined, and low density. There is no air or contrast within the appendix.  There is also marked wall thickening of the adjacent cecum.  Slight wall thickening of the very distal ileum.  Small inflammatory nodes are seen in the cecal mesentery.  No extraluminal bowel gas to suggest perforation.  No abscess.  These findings are worrisome for acute appendicitis.  Several low density lesions are scattered throughout the liver without change compatible with cysts.  There is one ill-defined hypodensity in the right lobe measuring 1.5 cm that is not  consistent with a simple cyst.  On delayed phase images, it fills in with contrast and becomes homogeneous with the remainder of the liver.  This likely represents a hemangioma or an abnormality related to phase of contrast.  Spleen, pancreas, adrenal glands are within normal limits.  Stable kidneys.  No evidence of disproportionate dilatation of bowel to suggest obstruction.  Bladder, uterus, and adnexa are within normal limits.  Advanced degenerative disc disease at L4-5.  No vertebral body compression deformity.  IMPRESSION: Findings are worrisome for acute appendicitis.  There is wall thickening of the appendix and cecum.  Other inflammatory disorders and neoplasm are in the differential diagnosis.  Critical Value/emergent results were called by telephone at the time of interpretation on 01/16/2013 at 1625 hours to [place], RN of the 2000 unit, who verbally acknowledged these results.   Original Report Authenticated By: Jolaine Click, M.D.    2D Echo: none  Cardiac Cath: none  Admission HPI: Original Note by Mary Harder MD  The patient is a 47 yo woman, history of HOCM w/AICD, afib (s/p ablation x3, on warfarin), history of GI bleed after polypectomy while on warfarin, presenting with abdominal pain. The patient awoke at 1am on the day of admission with abdominal pain, described as a dull, periumbilical pain, 7/10 in severity, with an occasional sharp component if pressure is placed on the area, without radiation. She notes associated symptoms of nausea and anorexia, without vomiting. Pain was somewhat improved by lying on her side, worsened by walking. She had a non-bloody BM this morning. No fevers, chills, diarrhea, constipation, chest pain, or shortness of breath.  The patient has a history of afib, on chronic warfarin, with an INR of 3.0 on 01/12/13. The patient has had 1 prior GI bleed, after a colonoscopy with polyp removal in 2012, which occurred while on warfarin. The patient also has a history of  an ovarian cyst which ruptured (per pt report) in 2012.    Hospital Course by problem list: Principal Problem:   Appendicitis, acute Atrial Fibrillation  HOCM Anxiety  Acute Appendicitis - Patient presented with acute onset abdominal pain with guarding and rebound tenderness. She was afebrile with no leukocytosis and hemodynamically stable with CT abd/pelvis findings of thickened, ill-defined, and low density appendix suggesting acute appendicitis. Urine analysis and lipase were unremarkable. Surgery was consulted and laproscopic appendectomy was to be performed. Patient received IV zoysn during hospitalization for infection prophylaxis. Due to supratherapeutic INR (2.45), reversal of coagulopathy before surgery was indicated to prevent bleeding. Three units of fresh frozen plasma and IV vitamin K were administered for reversal of coumadin with INR of 1.31 the day of surgery. Patient underwent laproscopic appendectomy on  01/17/13 with no major bleeding and post-operative complications. Biopsy specimen was taken and results pending at time of discharge. She required minimal pain medications and her diet was slowly advanced with no nausea or vomiting. She remained afebrile. Her abdomen was soft with mild incisional tenderness and well healing incision site with mild ecchymosis at umbilical region. She was started on coumadin per pharmacy on POD #1. No bleeding was noted. Leukocytosis of 14.6 at time of discharge most likely due to post-operative state. She was able to ambulate without difficulty and was hemodynamically stable (13.0/39.9) at time of discharge on POD #1. Patient to follow-up with surgery and primary care physician.   Atrial Fibrillation - Patient is status post ablation x 3 on chronic AC therapy with coumadin. Her INR on admission was 2.45. EKG revealed atrial flutter. For rate control she was on metoprolol and diltiazem as well as defetilide for rhythm control. While on defetilide EKG was  performed daily with QTc <500 and potassium was replaced to maintain levels above 4.  Her INR was reversed with IV vitamin K and 3 units of  FFP to INR of 1.31 before surgery. On POD #1 coumadin was restarted. EKG on day of discharge revealed atrial flutter with variable AV block and INR of 1.12. Patient to follow-up for INR check and adjustment of coumadin levels.       Hypertrophic Obstructive Cardiomyopathy (HOCM) - Patient followed by Dr. Graciela Husbands. Has AICD in place and on home lasix, metoprolol, diltiazem, and defetilide. Lasix was initially given and later discontinued to prevent hypovolemia. Other medications were continued during hospitalization. EF of 55% was noted on 06/13/12. Initial EKG revealed left ventricular hypertrophy with QRS widening and repolarization abnormality. Troponins x2 were negative. No chest pain was noted during hospitalization.   Anxiety - Patient received home zoloft for anxiety during hospitalization.   Discharge Vitals:   BP 104/71  Pulse 97  Temp(Src) 98.1 F (36.7 C) (Oral)  Resp 18  Ht 5\' 6"  (1.676 m)  Wt 185 lb 6.5 oz (84.1 kg)  BMI 29.94 kg/m2  SpO2 100%  LMP 12/24/2010  Discharge Labs:  Results for orders placed during the hospital encounter of 01/16/13 (from the past 24 hour(s))  TROPONIN I     Status: None   Collection Time    01/17/13  5:12 PM      Result Value Range   Troponin I <0.30  <0.30 ng/mL  BASIC METABOLIC PANEL     Status: Abnormal   Collection Time    01/18/13  5:17 AM      Result Value Range   Sodium 142  135 - 145 mEq/L   Potassium 4.1  3.5 - 5.1 mEq/L   Chloride 103  96 - 112 mEq/L   CO2 27  19 - 32 mEq/L   Glucose, Bld 133 (*) 70 - 99 mg/dL   BUN 9  6 - 23 mg/dL   Creatinine, Ser 9.60  0.50 - 1.10 mg/dL   Calcium 9.8  8.4 - 45.4 mg/dL   GFR calc non Af Amer >90  >90 mL/min   GFR calc Af Amer >90  >90 mL/min  CBC     Status: Abnormal   Collection Time    01/18/13  5:17 AM      Result Value Range   WBC 14.6 (*) 4.0 - 10.5  K/uL   RBC 4.29  3.87 - 5.11 MIL/uL   Hemoglobin 13.0  12.0 - 15.0 g/dL   HCT 39.9  36.0 - 46.0 %   MCV 93.0  78.0 - 100.0 fL   MCH 30.3  26.0 - 34.0 pg   MCHC 32.6  30.0 - 36.0 g/dL   RDW 47.8  29.5 - 62.1 %   Platelets 214  150 - 400 K/uL  PROTIME-INR     Status: None   Collection Time    01/18/13  5:17 AM      Result Value Range   Prothrombin Time 14.2  11.6 - 15.2 seconds   INR 1.12  0.00 - 1.49  MAGNESIUM     Status: None   Collection Time    01/18/13  5:17 AM      Result Value Range   Magnesium 2.1  1.5 - 2.5 mg/dL    Signed:  Otis Brace, MD   Time Spent on Discharge: 40 minutes Services Ordered on Discharge: none Equipment Ordered on Discharge: none

## 2013-01-19 ENCOUNTER — Telehealth (INDEPENDENT_AMBULATORY_CARE_PROVIDER_SITE_OTHER): Payer: Self-pay

## 2013-01-19 ENCOUNTER — Encounter (INDEPENDENT_AMBULATORY_CARE_PROVIDER_SITE_OTHER): Payer: Self-pay

## 2013-01-19 ENCOUNTER — Encounter (HOSPITAL_COMMUNITY): Payer: Self-pay | Admitting: General Surgery

## 2013-01-19 NOTE — Telephone Encounter (Signed)
Message copied by Ethlyn Gallery on Mon Jan 19, 2013 11:47 AM ------      Message from: Rise Paganini      Created: Mon Jan 19, 2013  8:42 AM      Regarding: Jeananne Rama: [phone]       Patient had surgery and has several questions about what to do or not do. Please call to discuss. Thank you. ------

## 2013-01-19 NOTE — Telephone Encounter (Signed)
Called pt back to check on her after surgery. The pt is doing ok. The pt wanted to get a f/u appt scheduled with Dr Dwain Sarna so we made this for 02/03/13. The pt wanted a note for work with her surgery date,restrictions,and the f/u appt. I completed a note and faxed to pt at [phone] per the pt's request along with a lap surgery post op instructions sheet.

## 2013-01-22 ENCOUNTER — Ambulatory Visit (INDEPENDENT_AMBULATORY_CARE_PROVIDER_SITE_OTHER): Payer: BC Managed Care – PPO | Admitting: Internal Medicine

## 2013-01-22 ENCOUNTER — Encounter: Payer: Self-pay | Admitting: Internal Medicine

## 2013-01-22 VITALS — BP 92/68 | HR 82 | Temp 97.1°F | Ht 66.0 in | Wt 182.0 lb

## 2013-01-22 DIAGNOSIS — Z09 Encounter for follow-up examination after completed treatment for conditions other than malignant neoplasm: Secondary | ICD-10-CM

## 2013-01-22 DIAGNOSIS — K3580 Unspecified acute appendicitis: Secondary | ICD-10-CM

## 2013-01-22 DIAGNOSIS — Z7901 Long term (current) use of anticoagulants: Secondary | ICD-10-CM

## 2013-01-22 DIAGNOSIS — Z1239 Encounter for other screening for malignant neoplasm of breast: Secondary | ICD-10-CM

## 2013-01-22 DIAGNOSIS — I4891 Unspecified atrial fibrillation: Secondary | ICD-10-CM

## 2013-01-22 LAB — POCT INR: INR: 1.8

## 2013-01-22 MED ORDER — WARFARIN SODIUM 4 MG PO TABS: 4.0000 mg | ORAL_TABLET | Freq: Every day | ORAL | Status: DC

## 2013-01-22 NOTE — Progress Notes (Signed)
  Subjective:    Patient ID: [name], female    DOB: 1966/05/18, 47 y.o.   MRN: [id number]  HPI [name] is a pleasant 47 year old woman with extensive PMH listed below including A. Fib with anticoagulation with warfarin who presents for HFU for acute appendicitis s/p laparoscopic appendectomy. She states that her abdomen is still sore with worse pain when she coughs. Advil 200mg  3 tablets were effective to relieve her pain the day after her discharge, she has not filled her prescription for Vicodin. She has daily regular BMs with no constipation, no melena, and no hematochezia. Her appetite is gradually improving, she denies nausea or vomiting. She has a sorethroat and dry cough since her surgery and intubation/extubation but is able to swallow her pills if she does it slowly.  She took 6mg  of warfarin on the evenings of 8/10 and 8/11 and resumed her daily dose of 4mg  as instructed but could not follow up with Dr. Alexandria Lodge at the Coumadin clinic on 8/11 as he is out of town this week.    Review of Systems  Constitutional: Positive for appetite change. Negative for fever, chills, diaphoresis and activity change.  HENT: Positive for sore throat and trouble swallowing.   Respiratory: Positive for cough. Negative for shortness of breath and wheezing.   Cardiovascular: Negative for chest pain, palpitations and leg swelling.  Gastrointestinal: Positive for abdominal pain. Negative for nausea, vomiting, diarrhea, constipation and blood in stool.  Genitourinary: Negative for dysuria and frequency.  Skin: Positive for wound. Negative for color change, pallor and rash.       Lap ports/surgical wounds  Neurological: Negative for dizziness and light-headedness.  Psychiatric/Behavioral: Negative for behavioral problems and agitation.       Objective:   Physical Exam  Nursing note and vitals reviewed. Constitutional: She is oriented to person, place, and time. She appears well-developed and  well-nourished. No distress.  Tired appearing  Eyes: Conjunctivae are normal. No scleral icterus.  Cardiovascular: Normal rate.   Pulmonary/Chest: Effort normal and breath sounds normal. No respiratory distress. She has no wheezes. She has no rales.  Abdominal: Soft. Bowel sounds are normal. She exhibits no distension and no mass. There is tenderness. There is no rebound and no guarding.  Diffuse abdominal tenderness. Supraumbilical and two ~1cm lateral port/surgical incisions covered with clear Dermabond with no surrounding edema, erythema, no drainage, active bleeding, or wound dehiscence. No bruises noted but violet-greenish discoloration immediately surrounding surgical wounds.     Musculoskeletal: She exhibits no edema and no tenderness.  Neurological: She is alert and oriented to person, place, and time.  Skin: Skin is warm and dry. No rash noted. She is not diaphoretic. No erythema. No pallor.  Psychiatric: She has a normal mood and affect. Her behavior is normal.          Assessment & Plan:

## 2013-01-22 NOTE — Assessment & Plan Note (Signed)
She has adequate intake per mouth and pain control with regular BMs. Her surgical wounds are not dehiscent with no signs of infection. She has follow up with [facility] on 8/19. Pt advised to call us or go to the ED if she develops fever/chills, nausea/vomitting, or blood in her stools. She verbalizes understanding.  Follow up for routine visit in one month or sooner PRN.

## 2013-01-22 NOTE — Assessment & Plan Note (Signed)
INR today at 1.8, goal INR is 2-3. Her anticoagulation was reversed prior to her surgery and resumed upon her discharge. Contacted inpatient Pharmacy as Dr. Alexandria Lodge in the Coumadin is out of town this week. Per Phillips Climes in Inpatient Pharmacy, pt has had steady INR with coumadin 4mg  daily and likely needs only small additional adjustment of one extra half tablet this evening.  Pt instructed to take coumadin 6mg  tonight and continue taking 4mg  daily with INR check on Monday 8/18 and follow up appointment with Dr. Alexandria Lodge.  Case discussed with Dr. Meredith Pel, her PCP.

## 2013-01-22 NOTE — Assessment & Plan Note (Addendum)
Irregularly irregular rhythm but rate controlled. INR of 1.8 but coumadin adjusted per above.

## 2013-01-22 NOTE — Patient Instructions (Addendum)
-  Take 6mg  of warfarin (1 and 1/2 tablet) tonight but go back to 4mg  daily starting tomorrow night.  -Follow up on Monday for INR check and visit with Dr. Alexandria Lodge.  -Avoid taking Advil to prevent bleeding. You may take Tylenol 500mg  every 8 hours as needed for pain.  -Call your surgeon for a follow up visit next week.  -If you have fever/chill, nausea, vomiting, blood in your stools, call us or go to the ED.  -Follow up with [name] in one month for routine visit or sooner if you have acute medical problems.

## 2013-01-23 NOTE — Progress Notes (Signed)
I saw and evaluated the patient.  I personally confirmed the key portions of the history and exam documented by Dr. Kennerly and I reviewed pertinent patient test results.  The assessment, diagnosis, and plan were formulated together and I agree with the documentation in the resident's note. 

## 2013-01-26 ENCOUNTER — Ambulatory Visit (INDEPENDENT_AMBULATORY_CARE_PROVIDER_SITE_OTHER): Payer: BC Managed Care – PPO | Admitting: Pharmacist

## 2013-01-26 DIAGNOSIS — Z7901 Long term (current) use of anticoagulants: Secondary | ICD-10-CM

## 2013-01-26 NOTE — Patient Instructions (Signed)
Patient instructed to take medications as defined in the Anti-coagulation Track section of this encounter.  Patient instructed to take today's dose.  Patient verbalized understanding of these instructions.    

## 2013-01-26 NOTE — Progress Notes (Signed)
Anti-Coagulation Progress Note  Fidela L Piasecki is a 47 y.o. female who is currently on an anti-coagulation regimen.    RECENT RESULTS: Recent results are below, the most recent result is correlated with a dose of 28 mg. per week: Lab Results  Component Value Date   INR 2.20 01/26/2013   INR 1.8 01/22/2013   INR 1.12 01/18/2013    ANTI-COAG DOSE: Anticoagulation Dose Instructions as of 01/26/2013     [name] Tue Wed Thu Fri Sat   New Dose 4 mg 4 mg 4 mg 4 mg 4 mg 4 mg 4 mg       ANTICOAG SUMMARY: Anticoagulation Episode Summary   Current INR goal 2.0-3.0  Next INR check 02/16/2013  INR from last check 2.20 (01/26/2013)  Weekly max dose   Target end date Indefinite  [facility]  Preferred lab   Send INR reminders to ANTICOAG IMP   Indications  Long-term (current) use of anticoagulants [V58.61] A-fib (Resolved) [427.31]        Comments       Anticoagulation Care Providers   Provider Role Specialty Phone number   Farley Ly, MD  Internal Medicine [phone]      ANTICOAG TODAY: Anticoagulation Summary as of 01/26/2013   INR goal 2.0-3.0  Selected INR 2.20 (01/26/2013)  Next INR check 02/16/2013  Target end date Indefinite   Indications  Long-term (current) use of anticoagulants [V58.61] A-fib (Resolved) [427.31]      Anticoagulation Episode Summary   INR check [facility]   Preferred lab    Send INR reminders to ANTICOAG IMP   Comments     Anticoagulation Care Providers   Provider Role Specialty Phone number   Farley Ly, MD  Internal Medicine [phone]      PATIENT INSTRUCTIONS: Patient Instructions  Patient instructed to take medications as defined in the Anti-coagulation Track section of this encounter.  Patient instructed to take today's dose.  Patient verbalized understanding of these instructions.       FOLLOW-UP Return in 3 weeks (on 02/16/2013) for Follow up INR at 1130h.  Hulen Luster,  III Pharm.D., CACP

## 2013-01-27 ENCOUNTER — Encounter (INDEPENDENT_AMBULATORY_CARE_PROVIDER_SITE_OTHER): Payer: Self-pay | Admitting: Internal Medicine

## 2013-01-27 ENCOUNTER — Ambulatory Visit (INDEPENDENT_AMBULATORY_CARE_PROVIDER_SITE_OTHER): Payer: BC Managed Care – PPO | Admitting: Internal Medicine

## 2013-01-27 VITALS — BP 128/74 | HR 64 | Temp 97.5°F | Resp 12 | Ht 66.0 in | Wt 182.0 lb

## 2013-01-27 DIAGNOSIS — K3580 Unspecified acute appendicitis: Secondary | ICD-10-CM

## 2013-01-27 NOTE — Patient Instructions (Signed)
May resume regular activity without restrictions. Follow up as needed. Call with questions or concerns.  

## 2013-01-27 NOTE — Progress Notes (Signed)
  Subjective: Pt returns to the clinic today after undergoing laparoscopic appendectomy on 01/17/13 by Dr. Dwain Sarna.  The patient is tolerating their diet well and is having no severe pain.  Bowel function is good.  No problems with the wounds.  Objective: Vital signs in last 24 hours: Reviewed  PE: Abd: soft, non-tender, +bs, incisions well healed  Lab Results:  No results found for this basename: WBC, HGB, HCT, PLT,  in the last 72 hours BMET No results found for this basename: NA, K, CL, CO2, GLUCOSE, BUN, CREATININE, CALCIUM,  in the last 72 hours PT/INR  Recent Labs  01/26/13 1602  INR 2.20   CMP     Component Value Date/Time   NA 142 01/18/2013 0517   K 4.1 01/18/2013 0517   CL 103 01/18/2013 0517   CO2 27 01/18/2013 0517   GLUCOSE 133* 01/18/2013 0517   BUN 9 01/18/2013 0517   CREATININE 0.77 01/18/2013 0517   CREATININE 0.78 01/16/2013 1153   CALCIUM 9.8 01/18/2013 0517   PROT 7.1 01/16/2013 1153   ALBUMIN 4.1 01/16/2013 1153   AST 21 01/16/2013 1153   ALT 16 01/16/2013 1153   ALKPHOS 99 01/16/2013 1153   BILITOT 0.5 01/16/2013 1153   GFRNONAA >90 01/18/2013 0517   GFRAA >90 01/18/2013 0517   Lipase     Component Value Date/Time   LIPASE 33 01/16/2013 1153       Studies/Results: No results found.  Anti-infectives: Anti-infectives   None       Assessment/Plan  1.  S/P Laparoscopic Appendectomy: doing well, may resume regular activity without restrictions, Pt will follow up with [name] PRN and knows to call with questions or concerns.     Jaretssi Kraker 01/27/2013

## 2013-02-02 NOTE — Discharge Summary (Signed)
  Date: 02/02/2013  Patient name: [name]  Medical record number: [id number]  Date of birth: 1966/02/02   This patient has been seen and the plan of care was discussed with the house staff. Please see their note for complete details. I concur with their findings and plan.  Jonah Blue, DO, FACP [facility] 02/02/2013, 1:33 PM

## 2013-02-03 ENCOUNTER — Encounter (INDEPENDENT_AMBULATORY_CARE_PROVIDER_SITE_OTHER): Payer: BC Managed Care – PPO | Admitting: General Surgery

## 2013-02-16 ENCOUNTER — Ambulatory Visit (INDEPENDENT_AMBULATORY_CARE_PROVIDER_SITE_OTHER): Payer: BC Managed Care – PPO | Admitting: Pharmacist

## 2013-02-16 DIAGNOSIS — Z7901 Long term (current) use of anticoagulants: Secondary | ICD-10-CM

## 2013-02-17 NOTE — Progress Notes (Signed)
Anti-Coagulation Progress Note  [name] is a 48 y.o. female who is currently on an anti-coagulation regimen.    RECENT RESULTS: Recent results are below, the most recent result is correlated with a dose of 28 mg. per week: Lab Results  Component Value Date   INR 2.50 02/17/2013   INR 2.20 01/26/2013   INR 1.8 01/22/2013    ANTI-COAG DOSE: Anticoagulation Dose Instructions as of 02/16/2013     [name] Tue Wed Thu Fri Sat   New Dose 4 mg 4 mg 4 mg 4 mg 4 mg 4 mg 4 mg       ANTICOAG SUMMARY: Anticoagulation Episode Summary   Current INR goal 2.0-3.0  Next INR check 03/16/2013  INR from last check 2.20 (01/26/2013)  Most recent INR 2.50 (02/17/2013)  Weekly max dose   Target end date Indefinite  IN[facility] location [facility]  Preferred lab   Send INR reminders to ANTICOAG IMP   Indications  Long-term (current) use of anticoagulants [V58.61] A-fib (Resolved) [427.31]        Comments       Anticoagulation Care Providers   Provider Role Specialty Phone number   Farley Ly, MD  Internal Medicine [phone]      ANTICOAG TODAY: Anticoagulation Summary as of 02/16/2013   INR goal 2.0-3.0  Selected INR 2.20 (01/26/2013)  Next INR check 03/16/2013  Target end date Indefinite   Indications  Long-term (current) use of anticoagulants [V58.61] A-fib (Resolved) [427.31]      Anticoagulation Episode Summary   INR check location [facility]   Preferred lab    Send INR reminders to ANTICOAG IMP   Comments     Anticoagulation Care Providers   Provider Role Specialty Phone number   Farley Ly, MD  Internal Medicine [phone]      PATIENT INSTRUCTIONS: Patient Instructions  Patient instructed to take medications as defined in the Anti-coagulation Track section of this encounter.  Patient instructed to take today's dose.  Patient verbalized understanding of these instructions.       FOLLOW-UP Return in 4 weeks (on 03/16/2013) for Follow up INR  at 4:15PM.  Hulen Luster, III Pharm.D., CACP

## 2013-02-17 NOTE — Patient Instructions (Signed)
Patient instructed to take medications as defined in the Anti-coagulation Track section of this encounter.  Patient instructed to take today's dose.  Patient verbalized understanding of these instructions.    

## 2013-02-18 NOTE — Progress Notes (Signed)
I have reviewed the note and agree with Dr. Saralyn Pilar note.

## 2013-02-23 ENCOUNTER — Ambulatory Visit (INDEPENDENT_AMBULATORY_CARE_PROVIDER_SITE_OTHER): Payer: BC Managed Care – PPO | Admitting: Nurse Practitioner

## 2013-02-23 ENCOUNTER — Encounter: Payer: Self-pay | Admitting: Nurse Practitioner

## 2013-02-23 ENCOUNTER — Encounter: Payer: Self-pay | Admitting: Internal Medicine

## 2013-02-23 ENCOUNTER — Ambulatory Visit (INDEPENDENT_AMBULATORY_CARE_PROVIDER_SITE_OTHER): Payer: BC Managed Care – PPO | Admitting: Internal Medicine

## 2013-02-23 VITALS — BP 96/65 | HR 95 | Temp 98.9°F | Ht 65.0 in | Wt 181.8 lb

## 2013-02-23 VITALS — BP 112/70 | HR 64 | Resp 16 | Ht 65.5 in | Wt 179.0 lb

## 2013-02-23 DIAGNOSIS — K3580 Unspecified acute appendicitis: Secondary | ICD-10-CM

## 2013-02-23 DIAGNOSIS — Z0000 Encounter for general adult medical examination without abnormal findings: Secondary | ICD-10-CM

## 2013-02-23 DIAGNOSIS — N83209 Unspecified ovarian cyst, unspecified side: Secondary | ICD-10-CM

## 2013-02-23 DIAGNOSIS — Z9889 Other specified postprocedural states: Secondary | ICD-10-CM

## 2013-02-23 DIAGNOSIS — Z01419 Encounter for gynecological examination (general) (routine) without abnormal findings: Secondary | ICD-10-CM

## 2013-02-23 LAB — POCT URINALYSIS DIPSTICK
Bilirubin, UA: NEGATIVE
Blood, UA: NEGATIVE
Glucose, UA: NEGATIVE

## 2013-02-23 NOTE — Progress Notes (Signed)
I saw and evaluated the patient.  I personally confirmed the key portions of the history and exam documented by Dr. Wilson and I reviewed pertinent patient test results.  The assessment, diagnosis, and plan were formulated together and I agree with the documentation in the resident's note. 

## 2013-02-23 NOTE — Assessment & Plan Note (Signed)
Assessment:  She was evaluated post-op by [facility] on 01/27/13.  She is doing well with good appetite and regular bowel habits.  No N/V, fever or significant abdominal pain.  Surgical scars have healed well.  Plan:  Follow-up with PCP in 6 months for routine health maintenance.

## 2013-02-23 NOTE — Progress Notes (Signed)
Patient ID: [name], female   DOB: 06/18/1965, 47 y.o.   MRN: [id number] 47 y.o. G0P0 Single Caucasian Fe here for annual exam.  Recent laparoscopic appendectomy in August. Still some tenderness. Same female partner for about 3 years.  Patient's last menstrual period was 10/06/2011.          Sexually active: yes  The current method of family planning is same sex partner.    Exercising: yes  cardio at home  Smoker:  no  Health Maintenance: Pap:  02/18/12, WNL, neg HPV in 2011 MMG:  01/18/12, BI-Rads 1: negative, scheduled for today Colonoscopy:  05/22/11, tubular adenoma, repeat in 5 years BMD:   never TDaP:  12/17/06 Labs: HB: PCP Urine: negative, pH 7.0   reports that she has never smoked. She has never used smokeless tobacco. She reports that she does not drink alcohol or use illicit drugs.  Past Medical History  Diagnosis Date  . Hypertrophic obstructive cardiomyopathy (HOCM)     TEE 09/02/2007 showed moderate to severe left ventricular hypertrophy with an interventricular septal dimension of 1.5 cm and posterior wall dimension of 7 cm; there was normal LV systolic function and estimated EF of approximately 60%; there did not appear to be any obvious outflow tract obstruction.   . Atrial fibrillation     S/P radiofrequency ablation by Dr. Clydie Braun at [facility] on 02/09/2008 and again on 03/31/2009; S/P ablation at [facility] by Dr. Marchia Bond on 08/10/2010, followed by recurrent atrial flutter/fibrillation.  Cardiac cath done on 02/19/2011 at [facility] showed no significant coronary disease.  Patient underwent Convergent Procedure at [facility] on 03/07/2011.  . Atrial tachycardia     Status post convergent ablation [facility] October 2012  . Ventricular tachycardia     Hx of nonsustained ventricular tachycardia, S/P hospitalization in March 2009; S/P ICD placement by Dr. Duke Salvia on 09/25/2007.  Marland Kitchen Anxiety     Acute stress reaction to multiple ICD shocks  . Automatic implantable  cardiac defibrillator- st judes 09/2007  . Seasonal allergic rhinitis   . GERD (gastroesophageal reflux disease)   . Hyperglycemia   . Dizziness   . Anemia   . Cholecystitis, acute 07/2001    S/P laparoscopic cholecystectomy, intraoperative cholangiogram, and transcystic common bile duct exploration by Dr. Avel Peace on 07/14/2001  . Pelvic mass     Pelvic ultrasound done on 11/30/10 to evaluate abdominal pain showed,  positioned between the ovaries and contiguous with the ovaries, a complex mass demonstrating posterior acoustical enhancement and no definite intralesional flow with color Doppler exam measuring 5.2 x 3.1 x 5.5 cm. The appearance raised the possibility of a ruptured ovarian cyst with subsequent development of a hematoma.   . Fibroids   . 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 Herbert Moors to evaluate anemia; pathology showed a tubulovillous adenoma with no high grade dysplasia or malignancy identified.    . Abnormal thyroid blood test     Mildly elevated TSH, resolved.  Marland Kitchen HYPERGLYCEMIA 12/24/2008    Qualifier: Diagnosis of  By: Meredith Pel MD, Dorene Sorrow      Past Surgical History  Procedure Laterality Date  . Cardiac defibrillator placement  09/25/2007    Placed by Dr. Duke Salvia on 09/25/2007.  . Laparoscopic cholecystectomy  07/14/2001    S/P laparoscopic cholecystectomy, intraoperative cholangiogram, and transcystic common bile duct exploration by Dr. Avel Peace on 07/14/2001  .  Convergent ablation  03/07/2011    Patient underwent Convergent Procedure at [facility] on 03/07/2011.  . Atrial ablation surgery  08/10/2010    ablation  done at [facility]  . Atrial ablation surgery  03/2009    ablation at [facility]  . Atrial ablation surgery  01/2008    ablation at [facility]  . Flexible sigmoidoscopy  05/26/2011    Procedure: FLEXIBLE SIGMOIDOSCOPY;  Surgeon: Shirley Friar, MD;  Location: [facility] ENDOSCOPY;  Service:  Endoscopy;  Laterality: N/A;  . Laparoscopic appendectomy N/A 01/17/2013    Procedure: APPENDECTOMY LAPAROSCOPIC;  Surgeon: Emelia Loron, MD;  Location: [facility] OR;  Service: General;  Laterality: N/A;  . Appendectomy      Current Outpatient Prescriptions  Medication Sig Dispense Refill  . diltiazem (CARDIZEM CD) 180 MG 24 hr capsule Take 180 mg by mouth daily.       Marland Kitchen dofetilide (TIKOSYN) 500 MCG capsule Take 500 mcg by mouth 2 (two) times daily.        . furosemide (LASIX) 20 MG tablet Take 1 tablet (20 mg total) by mouth daily.  90 tablet  3  . metoprolol (TOPROL-XL) 50 MG 24 hr tablet Take 50 mg by mouth 2 (two) times daily.       . Omega-3 Fatty Acids (FISH OIL) 1200 MG CAPS Take 1 capsule (1,200 mg total) by mouth daily.      . sertraline (ZOLOFT) 50 MG tablet Take 50 mg by mouth daily.       . SOY ISOFLAVONE PO Take 80 mg by mouth 2 (two) times daily.      Marland Kitchen warfarin (COUMADIN) 4 MG tablet Take 1 tablet (4 mg total) by mouth daily.  90 tablet  0  . [DISCONTINUED] esomeprazole (NEXIUM) 20 MG capsule Take 1 capsule (20 mg total) by mouth daily.  30 capsule  0   No current facility-administered medications for this visit.    Family History  Problem Relation Age of Onset  . Hypertrophic cardiomyopathy Sister   . Heart disease Sister   . Hypertension Sister   . Hypertrophic cardiomyopathy Cousin     3 cousins on mother's side Deceased  . Throat cancer Father   . Coronary artery disease Father 75    S/P CABG  . Heart disease Father   . Cancer Father     throat  . Breast cancer Paternal Aunt   . Colon polyps Mother   . Hypertrophic cardiomyopathy Mother   . Heart disease Mother   . Hyperlipidemia Mother   . Ovarian cancer Neg Hx     great aunt had  . Colon cancer Neg Hx   . Lung cancer Neg Hx   . Hypertrophic cardiomyopathy Maternal Grandmother   . Hypertrophic cardiomyopathy      great uncle  . Cancer Maternal Aunt     breast  . Heart disease Sister     ROS:   Pertinent items are noted in HPI.  Otherwise, a comprehensive ROS was negative.  Exam:   BP 112/70  Pulse 64  Resp 16  Ht 5' 5.5" (1.664 m)  Wt 179 lb (81.194 kg)  BMI 29.32 kg/m2  LMP 10/06/2011 Height: 5' 5.5" (166.4 cm)  Ht Readings from Last 3 Encounters:  02/23/13 5' 5.5" (1.664 m)  01/27/13 5\' 6"  (1.676 m)  01/22/13 5\' 6"  (1.676 m)    General appearance: alert, cooperative and appears stated age Head: Normocephalic, without obvious abnormality, atraumatic Neck: no adenopathy, supple, symmetrical, trachea midline and thyroid normal  to inspection and palpation Lungs: clear to auscultation bilaterally Breasts: normal appearance, no masses or tenderness Heart: regular rate and rhythm Abdomen: soft, non-tender; no masses,  no organomegaly Extremities: extremities normal, atraumatic, no cyanosis or edema Skin: Skin color, texture, turgor normal. No rashes or lesions Lymph nodes: Cervical, supraclavicular, and axillary nodes normal. No abnormal inguinal nodes palpated Neurologic: Grossly normal   Pelvic: External genitalia:  no lesions              Urethra:  normal appearing urethra with no masses, tenderness or lesions              Bartholin's and Skene's: normal                 Vagina: normal appearing vagina with normal color and discharge, no lesions              Cervix: anteverted              Pap taken: yes Bimanual Exam:  Uterus:  normal size, contour, position, consistency, mobility, non-tender              Adnexa: no mass, fullness, tenderness               Rectovaginal: Confirms               Anus:  normal sphincter tone, no lesions  A:  Well Woman with normal exam  history of Ovarian complex cyst or hematoma  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 Edward Jolly about need to have PUS - agreed to repeat and order  is placed.  Counseled on breast self exam, adequate intake of calcium and vitamin D, diet and exercise return annually or prn  An After Visit Summary was printed and given to the patient.

## 2013-02-23 NOTE — Patient Instructions (Addendum)

## 2013-02-23 NOTE — Patient Instructions (Addendum)
1. Please return to clinic 6 months for routine health maintenance exam.   2. Please take all medications as prescribed.    3. If you have worsening of your symptoms or new symptoms arise, please call the clinic ([phone]), or go to the ER immediately if symptoms are severe.

## 2013-02-23 NOTE — Progress Notes (Signed)
  Subjective:    Patient ID: [name], female    DOB: 1965/09/03, 47 y.o.   MRN: [id number]  HPI Comments: Ms. Melfi is a 47 year old woman with PMH of HOCM, hx of Vtach (s/p ICD placement in 2009), chronic diastolic HF (EF 55-60%), Afib on coumadin (INR 2.5 on 02/17/13) and anemia.  She presents for follow-up 1 month after laparoscopic appendectomy.  She denies complaint today.  She is eating well, denies N/V, diarrhea/constipation, headache or fever.  Her surgical scars are well healed.     Review of Systems  Constitutional: Negative for fever.  Respiratory: Negative for shortness of breath.   Cardiovascular: Positive for palpitations. Negative for chest pain.  Gastrointestinal: Negative for nausea, vomiting, abdominal pain, diarrhea, constipation and blood in stool.  Genitourinary: Negative for dysuria.  Neurological: Negative for dizziness, weakness, light-headedness and headaches.  Hematological:       Negative for bleeding       Objective:   Physical Exam  Constitutional: She is oriented to person, place, and time. She appears well-developed and well-nourished. No distress.  HENT:  Head: Normocephalic and atraumatic.  Mouth/Throat: Oropharynx is clear and moist. No oropharyngeal exudate.  Eyes: EOM are normal. Pupils are equal, round, and reactive to light. No scleral icterus.  Neck: Neck supple.  Cardiovascular: Normal rate.   Irregularly irregular rhythm   Pulmonary/Chest: Effort normal and breath sounds normal. No respiratory distress. She has no wheezes. She has no rales.  Abdominal: Soft. Bowel sounds are normal. She exhibits no distension. There is no tenderness.  Well healed laparoscopy scars  Neurological: She is alert and oriented to person, place, and time.  Skin: Skin is warm. She is not diaphoretic.  Psychiatric: She has a normal mood and affect. Her behavior is normal.          Assessment & Plan:  Please see problem based assessment and plan.

## 2013-02-25 ENCOUNTER — Other Ambulatory Visit: Payer: Self-pay | Admitting: Obstetrics & Gynecology

## 2013-02-25 DIAGNOSIS — N83209 Unspecified ovarian cyst, unspecified side: Secondary | ICD-10-CM

## 2013-02-26 ENCOUNTER — Encounter: Payer: Self-pay | Admitting: Obstetrics & Gynecology

## 2013-02-26 ENCOUNTER — Ambulatory Visit (INDEPENDENT_AMBULATORY_CARE_PROVIDER_SITE_OTHER): Payer: BC Managed Care – PPO | Admitting: Obstetrics & Gynecology

## 2013-02-26 ENCOUNTER — Ambulatory Visit (INDEPENDENT_AMBULATORY_CARE_PROVIDER_SITE_OTHER): Payer: BC Managed Care – PPO

## 2013-02-26 VITALS — BP 102/64 | Ht 65.5 in | Wt 181.6 lb

## 2013-02-26 DIAGNOSIS — N83209 Unspecified ovarian cyst, unspecified side: Secondary | ICD-10-CM

## 2013-02-26 DIAGNOSIS — D251 Intramural leiomyoma of uterus: Secondary | ICD-10-CM

## 2013-02-26 NOTE — Progress Notes (Signed)
47 y.o.Singlefemale here for a pelvic ultrasound.  H/O hematoma on right ovary that was never completely followed to completion.  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  Pt with no pain.  Recent hx of appendectomy.  Doing well.  Has f/u exam 8/19.  Patient's last menstrual period was 10/06/2011.  Sexually active:  yes  Contraception: not needed due to sexual preference  FINDINGS: UTERUS: 5.2 x 2.4 x 2.9cm (compared to prior u/s, uterus is smaller) with 13 mm left posterior fibroid (on prior u/s there appeared to be 4 small fibroids EMS: 1.91mm ADNEXA:   Left ovary 2.3 x 2.0 x 1.3cm   Right ovary 2.5 x 1.4 x 1.8cm, no evidence of hematoma or cysts CUL DE SAC: 16 x 21 mm free fluid, appears simple  Reviewed images with patient.  Compared to prior images and these shown to patient as well.    Assessment:  H/o of right ovarian hematoma, completely resolved.  Uterine fibroid. Plan: As uterus is small, even though fibroid appears larger today, do not feel needs additional imaging.  This is done with new machine compared to prior one and I feel u/s today give better images than prior one.  D/W pt the above.  Voices understanding and is reassured.  All questions answered.  ~15 minutes spent with patient >50% of time was in face to face discussion of above.

## 2013-02-26 NOTE — Patient Instructions (Addendum)
Please call if you have any new problems/issues 

## 2013-03-01 NOTE — Progress Notes (Signed)
Encounter reviewed by Dr. Brook Silva.  

## 2013-03-16 ENCOUNTER — Ambulatory Visit (INDEPENDENT_AMBULATORY_CARE_PROVIDER_SITE_OTHER): Payer: BC Managed Care – PPO | Admitting: Pharmacist

## 2013-03-16 DIAGNOSIS — Z7901 Long term (current) use of anticoagulants: Secondary | ICD-10-CM

## 2013-03-16 LAB — POCT INR: INR: 4.1

## 2013-03-16 NOTE — Patient Instructions (Signed)
Patient instructed to take medications as defined in the Anti-coagulation Track section of this encounter.  Patient instructed to OMIT today's dose.  Patient verbalized understanding of these instructions.    

## 2013-03-16 NOTE — Progress Notes (Signed)
Anti-Coagulation Progress Note  [name] is a 47 y.o. female who is currently on an anti-coagulation regimen.    RECENT RESULTS: Recent results are below, the most recent result is correlated with a dose of 28 mg. per week: Lab Results  Component Value Date   INR 4.10 03/16/2013   INR 2.50 02/17/2013   INR 2.20 01/26/2013    ANTI-COAG DOSE: Anticoagulation Dose Instructions as of 03/16/2013     [name] Tue Wed Thu Fri Sat   New Dose 4 mg 2 mg 4 mg 4 mg 4 mg 4 mg 4 mg       ANTICOAG SUMMARY: Anticoagulation Episode Summary   Current INR goal 2.0-3.0  Next INR check 03/30/2013  INR from last check 4.10! (03/16/2013)  Weekly max dose   Target end date Indefinite  [facility]  Preferred lab   Send INR reminders to ANTICOAG IMP   Indications  Long-term (current) use of anticoagulants [V58.61] A-fib (Resolved) [427.31]        Comments       Anticoagulation Care Providers   Provider Role Specialty Phone number   Farley Ly, MD  Internal Medicine [phone]      ANTICOAG TODAY: Anticoagulation Summary as of 03/16/2013   INR goal 2.0-3.0  Selected INR 4.10! (03/16/2013)  Next INR check 03/30/2013  Target end date Indefinite   Indications  Long-term (current) use of anticoagulants [V58.61] A-fib (Resolved) [427.31]      Anticoagulation Episode Summary   [facility]   Preferred lab    Send INR reminders to ANTICOAG IMP   Comments     Anticoagulation Care Providers   Provider Role Specialty Phone number   Farley Ly, MD  Internal Medicine [phone]      PATIENT INSTRUCTIONS: Patient Instructions  Patient instructed to take medications as defined in the Anti-coagulation Track section of this encounter.  Patient instructed to OMIT today's dose.  Patient verbalized understanding of these instructions.       FOLLOW-UP Return in 2 weeks (on 03/30/2013) for Follow up INR at 4:45PM.  Hulen Luster,  III Pharm.D., CACP

## 2013-03-17 NOTE — Progress Notes (Signed)
I have reviewed Dr. Saralyn Pilar note and plan, agree.  Indication for anticoagulation is Afib, lifelong.

## 2013-03-30 ENCOUNTER — Ambulatory Visit (INDEPENDENT_AMBULATORY_CARE_PROVIDER_SITE_OTHER): Payer: BC Managed Care – PPO | Admitting: Pharmacist

## 2013-03-30 DIAGNOSIS — Z7901 Long term (current) use of anticoagulants: Secondary | ICD-10-CM

## 2013-03-30 LAB — POCT INR: INR: 3.8

## 2013-03-30 NOTE — Progress Notes (Signed)
Anti-Coagulation Progress Note  [name] is a 47 y.o. female who is currently on an anti-coagulation regimen.    RECENT RESULTS: Recent results are below, the most recent result is correlated with a dose of 30 mg. per week: Lab Results  Component Value Date   INR 3.80 03/30/2013   INR 4.10 03/16/2013   INR 2.50 02/17/2013    ANTI-COAG DOSE: Anticoagulation Dose Instructions as of 03/30/2013     [name] Tue Wed Thu Fri Sat   New Dose 4 mg 2 mg 4 mg 4 mg 4 mg 4 mg 4 mg       ANTICOAG SUMMARY: Anticoagulation Episode Summary   Current INR goal 2.0-3.0  Next INR check 04/20/2013  INR from last check 3.80! (03/30/2013)  Weekly max dose   Target end date Indefinite  [facility]  Preferred lab   Send INR reminders to ANTICOAG IMP   Indications  Long-term (current) use of anticoagulants [V58.61] A-fib (Resolved) [427.31]        Comments       Anticoagulation Care Providers   Provider Role Specialty Phone number   Farley Ly, MD  Internal Medicine [phone]      ANTICOAG TODAY: Anticoagulation Summary as of 03/30/2013   INR goal 2.0-3.0  Selected INR 3.80! (03/30/2013)  Next INR check 04/20/2013  Target end date Indefinite   Indications  Long-term (current) use of anticoagulants [V58.61] A-fib (Resolved) [427.31]      Anticoagulation Episode Summary   [facility]   Preferred lab    Send INR reminders to ANTICOAG IMP   Comments     Anticoagulation Care Providers   Provider Role Specialty Phone number   Farley Ly, MD  Internal Medicine [phone]      PATIENT INSTRUCTIONS: Patient Instructions  Patient instructed to take medications as defined in the Anti-coagulation Track section of this encounter.  Patient instructed to take today's dose.  Patient verbalized understanding of these instructions.       FOLLOW-UP Return in 3 weeks (on 04/20/2013) for Follow up INR at 4:30PM.  Hulen Luster, III Pharm.D., CACP

## 2013-03-30 NOTE — Patient Instructions (Signed)
Patient instructed to take medications as defined in the Anti-coagulation Track section of this encounter.  Patient instructed to take today's dose.  Patient verbalized understanding of these instructions.    

## 2013-04-03 ENCOUNTER — Other Ambulatory Visit: Payer: Self-pay | Admitting: Internal Medicine

## 2013-04-16 ENCOUNTER — Other Ambulatory Visit: Payer: Self-pay

## 2013-04-20 ENCOUNTER — Ambulatory Visit (INDEPENDENT_AMBULATORY_CARE_PROVIDER_SITE_OTHER): Payer: BC Managed Care – PPO | Admitting: Pharmacist

## 2013-04-20 DIAGNOSIS — Z7901 Long term (current) use of anticoagulants: Secondary | ICD-10-CM

## 2013-04-20 NOTE — Patient Instructions (Signed)
Patient instructed to take medications as defined in the Anti-coagulation Track section of this encounter.  Patient instructed to take today's dose.  Patient verbalized understanding of these instructions.    

## 2013-04-20 NOTE — Progress Notes (Signed)
Anti-Coagulation Progress Note  [name] is a 47 y.o. female who is currently on an anti-coagulation regimen.    RECENT RESULTS: Recent results are below, the most recent result is correlated with a dose of 26 mg. per week: Lab Results  Component Value Date   INR 2.50 04/20/2013   INR 3.80 03/30/2013   INR 4.10 03/16/2013    ANTI-COAG DOSE: Anticoagulation Dose Instructions as of 04/20/2013     [name] Tue Wed Thu Fri Sat   New Dose 4 mg 2 mg 4 mg 4 mg 4 mg 4 mg 4 mg       ANTICOAG SUMMARY: Anticoagulation Episode Summary   Current INR goal 2.0-3.0  Next INR check 05/25/2013  INR from last check 2.50 (04/20/2013)  Weekly max dose   Target end date Indefinite  [facility]  Preferred lab   Send INR reminders to ANTICOAG IMP   Indications  Long-term (current) use of anticoagulants [V58.61] A-fib (Resolved) [427.31]        Comments       Anticoagulation Care Providers   Provider Role Specialty Phone number   Farley Ly, MD  Internal Medicine [phone]      ANTICOAG TODAY: Anticoagulation Summary as of 04/20/2013   INR goal 2.0-3.0  Selected INR 2.50 (04/20/2013)  Next INR check 05/25/2013  Target end date Indefinite   Indications  Long-term (current) use of anticoagulants [V58.61] A-fib (Resolved) [427.31]      Anticoagulation Episode Summary   [facility]   Preferred lab    Send INR reminders to ANTICOAG IMP   Comments     Anticoagulation Care Providers   Provider Role Specialty Phone number   Farley Ly, MD  Internal Medicine [phone]      PATIENT INSTRUCTIONS: Patient Instructions  Patient instructed to take medications as defined in the Anti-coagulation Track section of this encounter.  Patient instructed to take today's dose.  Patient verbalized understanding of these instructions.       FOLLOW-UP Return in 5 weeks (on 05/25/2013) for Follow up INR at 4PM.  Hulen Luster, III Pharm.D., CACP

## 2013-04-21 NOTE — Progress Notes (Signed)
I have reviewed Dr. Saralyn Pilar note. [name] is on coumadin for Afib.  INR is at goal.

## 2013-04-30 ENCOUNTER — Encounter: Payer: Self-pay | Admitting: Internal Medicine

## 2013-04-30 ENCOUNTER — Ambulatory Visit (INDEPENDENT_AMBULATORY_CARE_PROVIDER_SITE_OTHER): Payer: BC Managed Care – PPO | Admitting: Internal Medicine

## 2013-04-30 VITALS — BP 103/68 | HR 67 | Ht 66.0 in | Wt 189.0 lb

## 2013-04-30 DIAGNOSIS — I472 Ventricular tachycardia: Secondary | ICD-10-CM

## 2013-04-30 DIAGNOSIS — I422 Other hypertrophic cardiomyopathy: Secondary | ICD-10-CM

## 2013-04-30 DIAGNOSIS — I4891 Unspecified atrial fibrillation: Secondary | ICD-10-CM

## 2013-04-30 DIAGNOSIS — Z95810 Presence of automatic (implantable) cardiac defibrillator: Secondary | ICD-10-CM

## 2013-04-30 LAB — MDC_IDC_ENUM_SESS_TYPE_INCLINIC
Battery Remaining Longevity: 4.3 mo
Battery Voltage: 2.5 V
Brady Statistic RA Percent Paced: 43 %
Brady Statistic RV Percent Paced: 13 %
Date Time Interrogation Session: 20141120094715
HighPow Impedance: 73.125
Implantable Pulse Generator Serial Number: 523975
Lead Channel Impedance Value: 375 Ohm
Lead Channel Impedance Value: 412.5 Ohm
Lead Channel Pacing Threshold Amplitude: 0.5 V
Lead Channel Pacing Threshold Amplitude: 0.75 V
Lead Channel Pacing Threshold Pulse Width: 0.5 ms
Lead Channel Pacing Threshold Pulse Width: 0.8 ms
Lead Channel Sensing Intrinsic Amplitude: 11.7 mV
Lead Channel Sensing Intrinsic Amplitude: 3.4 mV
Lead Channel Setting Pacing Amplitude: 2 V
Lead Channel Setting Pacing Amplitude: 2.5 V
Lead Channel Setting Pacing Pulse Width: 0.5 ms
Lead Channel Setting Sensing Sensitivity: 0.3 mV
Zone Setting Detection Interval: 250 ms

## 2013-04-30 NOTE — Patient Instructions (Addendum)
Your physician recommends that you continue on your current medications as directed. Please refer to the Current Medication list given to you today.  Remote monitoring is used to monitor your Pacemaker of ICD from home. This monitoring reduces the number of office visits required to check your device to one time per year. It allows [name] to keep an eye on the functioning of your device to ensure it is working properly. You are scheduled for a device check from home on 08/03/2013. You may send your transmission at any time that day. If you have a wireless device, the transmission will be sent automatically. After your physician reviews your transmission, you will receive a postcard with your next transmission date.  You will be reaching ERI before next follow up in 6 months - so no appointment made

## 2013-04-30 NOTE — Progress Notes (Signed)
Patient Care Team: Farley Ly, MD as PCP - General Duke Salvia, MD as Consulting Physician (Cardiology) Jeannine Boga III, [facility] as Pharmacist (Pharmacist)   HPI  [name] is a 47 y.o. female Seen in followup for hypertrophic cardiomyopathy status post ICD implantation. She's also had atrial fibrillation for which she underwent catheter ablation at [facility] and then by Dr. Macon Large at [facility] and most recently a convergent procedure at [facility] 10/12. sHe also has a history of nonsustained ventricular tachycardia.  She has persistent atrial arrhythmia and is followed at [facility]; she was seen last week  They decided to keep her on tikosyn and she is feeling pretty good  Past Medical History  Diagnosis Date  . Hypertrophic obstructive cardiomyopathy (HOCM)     TEE 09/02/2007 showed moderate to severe left ventricular hypertrophy with an interventricular septal dimension of 1.5 cm and posterior wall dimension of 7 cm; there was normal LV systolic function and estimated EF of approximately 60%; there did not appear to be any obvious outflow tract obstruction.   . Atrial fibrillation     S/P radiofrequency ablation by Dr. Clydie Braun at [facility] on 02/09/2008 and again on 03/31/2009; S/P ablation at [facility] by Dr. Marchia Bond on 08/10/2010, followed by recurrent atrial flutter/fibrillation.  Cardiac cath done on 02/19/2011 at [facility] showed no significant coronary disease.  Patient underwent Convergent Procedure at [facility] on 03/07/2011.  . Atrial tachycardia     Status post convergent ablation [facility] October 2012  . Ventricular tachycardia     Hx of nonsustained ventricular tachycardia, S/P hospitalization in March 2009; S/P ICD placement by Dr. Duke Salvia on 09/25/2007.  Marland Kitchen Anxiety     Acute stress reaction to multiple ICD shocks  . Automatic implantable cardiac defibrillator- [facility] 09/2007  . Seasonal allergic rhinitis   . GERD (gastroesophageal reflux disease)   .  Hyperglycemia   . Dizziness   . Anemia   . Cholecystitis, acute 07/2001    S/P laparoscopic cholecystectomy, intraoperative cholangiogram, and transcystic common bile duct exploration by Dr. Avel Peace on 07/14/2001  . Pelvic mass     Pelvic ultrasound done on 11/30/10 to evaluate abdominal pain showed,  positioned between the ovaries and contiguous with the ovaries, a complex mass demonstrating posterior acoustical enhancement and no definite intralesional flow with color Doppler exam measuring 5.2 x 3.1 x 5.5 cm. The appearance raised the possibility of a ruptured ovarian cyst with subsequent development of a hematoma.   . Fibroids   . 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 Herbert Moors to evaluate anemia; pathology showed a tubulovillous adenoma with no high grade dysplasia or malignancy identified.    . Abnormal thyroid blood test     Mildly elevated TSH, resolved.  Marland Kitchen HYPERGLYCEMIA 12/24/2008    Qualifier: Diagnosis of  By: Meredith Pel MD, Dorene Sorrow      Past Surgical History  Procedure Laterality Date  . Cardiac defibrillator placement  09/25/2007    Placed by Dr. Duke Salvia on 09/25/2007.  . Laparoscopic cholecystectomy  07/14/2001    S/P laparoscopic cholecystectomy, intraoperative cholangiogram, and transcystic common bile duct exploration by Dr. Avel Peace on 07/14/2001  . Convergent ablation  03/07/2011    Patient underwent Convergent Procedure at [facility] on 03/07/2011.  . Atrial ablation surgery  08/10/2010    ablation  done at [facility]  . Atrial ablation surgery  03/2009    ablation at [facility]  . Atrial ablation surgery  01/2008    ablation at [facility]  . Flexible sigmoidoscopy  05/26/2011    Procedure: FLEXIBLE SIGMOIDOSCOPY;  Surgeon: Shirley Friar, MD;  Location: [facility] ENDOSCOPY;  Service: Endoscopy;  Laterality: N/A;  . Laparoscopic appendectomy N/A 01/17/2013    Procedure: APPENDECTOMY LAPAROSCOPIC;   Surgeon: Emelia Loron, MD;  Location: [facility] OR;  Service: General;  Laterality: N/A;  . Appendectomy      Current Outpatient Prescriptions  Medication Sig Dispense Refill  . diltiazem (CARDIZEM CD) 180 MG 24 hr capsule Take 180 mg by mouth daily.       Marland Kitchen dofetilide (TIKOSYN) 500 MCG capsule Take 500 mcg by mouth 2 (two) times daily.        . furosemide (LASIX) 20 MG tablet Take 1 tablet (20 mg total) by mouth daily.  90 tablet  3  . metoprolol (TOPROL-XL) 50 MG 24 hr tablet Take 50 mg by mouth 2 (two) times daily.       . Omega-3 Fatty Acids (FISH OIL) 1200 MG CAPS Take 1 capsule (1,200 mg total) by mouth daily.      . sertraline (ZOLOFT) 50 MG tablet Take 50 mg by mouth daily.       . SOY ISOFLAVONE PO Take 80 mg by mouth 2 (two) times daily.      Marland Kitchen warfarin (COUMADIN) 4 MG tablet Take as directed by the anticoagulation clinic.  30 tablet  2  . [DISCONTINUED] esomeprazole (NEXIUM) 20 MG capsule Take 1 capsule (20 mg total) by mouth daily.  30 capsule  0   No current facility-administered medications for this visit.    No Known Allergies  Review of Systems negative except from HPI and PMH  Physical Exam LMP 10/06/2011 Well developed and well nourished in no acute distress HENT normal E scleral and icterus clear Neck Supple JVP flat; carotids brisk and full Clear to ausculation Regular rate and rhythm, no murmurs gallops or rub Soft with active bowel sounds No clubbing cyanosis Trace Edema Alert and oriented, grossly normal motor and sensory function Skin Warm and Dry  ECG demonstrates a QTC of 490 ms.   Assessment and  Plan  HCM  ICD Atrial Fibrillation  She is still holding about 55% atrial fibrillation. We will continue her on her dofetilide. Last measurements potassium 8/14--4.1. She will get repeat assessments which he sees a physician at [facility].   She is approaching ERI of her ICD. She'll get monthly checks.

## 2013-05-24 ENCOUNTER — Other Ambulatory Visit: Payer: Self-pay | Admitting: Internal Medicine

## 2013-05-25 ENCOUNTER — Ambulatory Visit (INDEPENDENT_AMBULATORY_CARE_PROVIDER_SITE_OTHER): Payer: BC Managed Care – PPO | Admitting: Pharmacist

## 2013-05-25 DIAGNOSIS — I4891 Unspecified atrial fibrillation: Secondary | ICD-10-CM

## 2013-05-25 DIAGNOSIS — Z7901 Long term (current) use of anticoagulants: Secondary | ICD-10-CM

## 2013-05-25 LAB — POCT INR: INR: 2

## 2013-05-25 NOTE — Progress Notes (Signed)
Anti-Coagulation Progress Note  [name] is a 47 y.o. female who is currently on an anti-coagulation regimen.    RECENT RESULTS: Recent results are below, the most recent result is correlated with a dose of 24 mg. per week: Lab Results  Component Value Date   INR 2.00 05/25/2013   INR 2.50 04/20/2013   INR 3.80 03/30/2013    ANTI-COAG DOSE: Anticoagulation Dose Instructions as of 05/25/2013     [name] Tue Wed Thu Fri Sat   New Dose 4 mg 2 mg 4 mg 4 mg 2 mg 4 mg 4 mg       ANTICOAG SUMMARY: Anticoagulation Episode Summary   Current INR goal 2.0-3.0  Next INR check 06/15/2013  INR from last check 2.00 (05/25/2013)  Weekly max dose   Target end date Indefinite  [facility]  Preferred lab   Send INR reminders to ANTICOAG IMP   Indications  Long-term (current) use of anticoagulants [V58.61] A-fib (Resolved) [427.31]        Comments       Anticoagulation Care Providers   Provider Role Specialty Phone number   Farley Ly, MD  Internal Medicine [phone]      ANTICOAG TODAY: Anticoagulation Summary as of 05/25/2013   INR goal 2.0-3.0  Selected INR 2.00 (05/25/2013)  Next INR check 06/15/2013  Target end date Indefinite   Indications  Long-term (current) use of anticoagulants [V58.61] A-fib (Resolved) [427.31]      Anticoagulation Episode Summary   [facility] check [facility]   Preferred lab    Send INR reminders to ANTICOAG IMP   Comments     Anticoagulation Care Providers   Provider Role Specialty Phone number   Farley Ly, MD  Internal Medicine [phone]      PATIENT INSTRUCTIONS: Patient Instructions  Patient instructed to take medications as defined in the Anti-coagulation Track section of this encounter.  Patient instructed to take today's dose.  Patient verbalized understanding of these instructions.       FOLLOW-UP Return in 3 weeks (on 06/15/2013) for Follow up INR at 4:30PM.  Hulen Luster,  III Pharm.D., CACP

## 2013-05-25 NOTE — Patient Instructions (Signed)
Patient instructed to take medications as defined in the Anti-coagulation Track section of this encounter.  Patient instructed to take today's dose.  Patient verbalized understanding of these instructions.    

## 2013-05-26 ENCOUNTER — Encounter: Payer: Self-pay | Admitting: *Deleted

## 2013-05-27 ENCOUNTER — Other Ambulatory Visit: Payer: Self-pay | Admitting: Internal Medicine

## 2013-05-27 DIAGNOSIS — Z79899 Other long term (current) drug therapy: Secondary | ICD-10-CM

## 2013-05-27 DIAGNOSIS — I4891 Unspecified atrial fibrillation: Secondary | ICD-10-CM

## 2013-05-27 NOTE — Progress Notes (Signed)
Labs requested by Dr. Herbert Deaner at [facility] for management of Tikosyn - results to be faxed to him at [phone].

## 2013-05-29 ENCOUNTER — Other Ambulatory Visit (INDEPENDENT_AMBULATORY_CARE_PROVIDER_SITE_OTHER): Payer: BC Managed Care – PPO

## 2013-05-29 DIAGNOSIS — Z79899 Other long term (current) drug therapy: Secondary | ICD-10-CM

## 2013-05-29 DIAGNOSIS — I4891 Unspecified atrial fibrillation: Secondary | ICD-10-CM

## 2013-05-29 LAB — BASIC METABOLIC PANEL WITH GFR
CO2: 29 mEq/L (ref 19–32)
Calcium: 9.5 mg/dL (ref 8.4–10.5)
Chloride: 106 mEq/L (ref 96–112)
Creat: 0.88 mg/dL (ref 0.50–1.10)
GFR, Est Non African American: 77 mL/min
Potassium: 4.6 mEq/L (ref 3.5–5.3)
Sodium: 141 mEq/L (ref 135–145)

## 2013-05-29 LAB — MAGNESIUM: Magnesium: 2.1 mg/dL (ref 1.5–2.5)

## 2013-05-29 NOTE — Progress Notes (Signed)
Lab results faxed to [facility] EP Dept. Attention Connie,FNP [phone] at 11:05am [facility] EP Dept. Spoke with Donata Clay and verified that results had been received [facility] 11:09am

## 2013-06-15 ENCOUNTER — Ambulatory Visit (INDEPENDENT_AMBULATORY_CARE_PROVIDER_SITE_OTHER): Payer: BC Managed Care – PPO | Admitting: Pharmacist

## 2013-06-15 DIAGNOSIS — I4891 Unspecified atrial fibrillation: Secondary | ICD-10-CM

## 2013-06-15 DIAGNOSIS — Z7901 Long term (current) use of anticoagulants: Secondary | ICD-10-CM

## 2013-06-15 LAB — POCT INR: INR: 1.9

## 2013-06-16 NOTE — Progress Notes (Signed)
Anti-Coagulation Progress Note  [name] is a 48 y.o. female who is currently on an anti-coagulation regimen.    RECENT RESULTS: Recent results are below, the most recent result is correlated with a dose of 24 mg. per week: Lab Results  Component Value Date   INR 1.90 06/15/2013   INR 2.00 05/25/2013   INR 2.50 04/20/2013    ANTI-COAG DOSE: Anticoagulation Dose Instructions as of 06/15/2013     [name] Tue Wed Thu Fri Sat   New Dose 4 mg 4 mg 4 mg 4 mg 2 mg 4 mg 4 mg       ANTICOAG SUMMARY: Anticoagulation Episode Summary   Current INR goal 2.0-3.0  Next INR check 07/13/2013  INR from last check 1.90! (06/15/2013)  Weekly max dose   Target end date Indefinite  [facility]  Preferred lab   Send INR reminders to ANTICOAG IMP   Indications  Long-term (current) use of anticoagulants [V58.61] A-fib (Resolved) [427.31]        Comments       Anticoagulation Care Providers   Provider Role Specialty Phone number   Axel Filler, MD  Internal Medicine [phone]      ANTICOAG TODAY: Anticoagulation Summary as of 06/15/2013   INR goal 2.0-3.0  Selected INR 1.90! (06/15/2013)  Next INR check 07/13/2013  Target end date Indefinite   Indications  Long-term (current) use of anticoagulants [V58.61] A-fib (Resolved) [427.31]      Anticoagulation Episode Summary   [facility] check location [facility]   Preferred lab    Send INR reminders to ANTICOAG IMP   Comments     Anticoagulation Care Providers   Provider Role Specialty Phone number   Axel Filler, MD  Internal Medicine [phone]      PATIENT INSTRUCTIONS: Patient Instructions  Patient instructed to take medications as defined in the Anti-coagulation Track section of this encounter.  Patient instructed to take today's dose.  Patient verbalized understanding of these instructions.       FOLLOW-UP Return in 4 weeks (on 07/13/2013) for Follow up INR at 4:30PM.  [name],  III Pharm.D., CACP

## 2013-06-16 NOTE — Patient Instructions (Signed)
Patient instructed to take medications as defined in the Anti-coagulation Track section of this encounter.  Patient instructed to take today's dose.  Patient verbalized understanding of these instructions.    

## 2013-06-26 ENCOUNTER — Other Ambulatory Visit: Payer: Self-pay | Admitting: Internal Medicine

## 2013-07-09 ENCOUNTER — Ambulatory Visit (INDEPENDENT_AMBULATORY_CARE_PROVIDER_SITE_OTHER): Payer: BC Managed Care – PPO | Admitting: *Deleted

## 2013-07-09 DIAGNOSIS — Z4502 Encounter for adjustment and management of automatic implantable cardiac defibrillator: Secondary | ICD-10-CM

## 2013-07-09 LAB — MDC_IDC_ENUM_SESS_TYPE_INCLINIC
Battery Remaining Longevity: 0 mo
Battery Voltage: 2.42 V
Brady Statistic RA Percent Paced: 35 %
Brady Statistic RV Percent Paced: 11 %
HighPow Impedance: 70.875
Lead Channel Impedance Value: 412.5 Ohm
Lead Channel Sensing Intrinsic Amplitude: 11.7 mV
Lead Channel Sensing Intrinsic Amplitude: 4.7 mV
Lead Channel Setting Pacing Amplitude: 2 V
Lead Channel Setting Pacing Amplitude: 2.5 V
Lead Channel Setting Sensing Sensitivity: 0.3 mV
MDC IDC MSMT LEADCHNL RV IMPEDANCE VALUE: 375 Ohm
MDC IDC PG SERIAL: 523975
MDC IDC SESS DTM: 20150129150138
MDC IDC SET LEADCHNL RV PACING PULSEWIDTH: 0.5 ms
Zone Setting Detection Interval: 250 ms

## 2013-07-09 NOTE — Progress Notes (Signed)
Battery reached ERI on 07/09/13. Pt notified through device vibratory alert.  ROV w/ BE 07/22/13 @ 3:00

## 2013-07-13 ENCOUNTER — Ambulatory Visit (INDEPENDENT_AMBULATORY_CARE_PROVIDER_SITE_OTHER): Payer: BC Managed Care – PPO | Admitting: Pharmacist

## 2013-07-13 DIAGNOSIS — Z7901 Long term (current) use of anticoagulants: Secondary | ICD-10-CM

## 2013-07-13 LAB — POCT INR: INR: 1.8

## 2013-07-13 NOTE — Progress Notes (Signed)
Anti-Coagulation Progress Note  [name] is a 48 y.o. female who is currently on an anti-coagulation regimen.    RECENT RESULTS: Recent results are below, the most recent result is correlated with a dose of 26 mg. per week: Lab Results  Component Value Date   INR 1.80 07/13/2013   INR 1.90 06/15/2013   INR 2.00 05/25/2013    ANTI-COAG DOSE: Anticoagulation Dose Instructions as of 07/13/2013     [name] Tue Wed Thu Fri Sat   New Dose 4 mg 4 mg 4 mg 4 mg 4 mg 4 mg 4 mg       ANTICOAG SUMMARY: Anticoagulation Episode Summary   Current INR goal 2.0-3.0  Next INR check 07/27/2013  INR from last check 1.80! (07/13/2013)  Weekly max dose   Target end date Indefinite  [facility]  Preferred lab   Send INR reminders to ANTICOAG IMP   Indications  Long-term (current) use of anticoagulants [V58.61] A-fib (Resolved) [427.31]        Comments       Anticoagulation Care Providers   Provider Role Specialty Phone number   Axel Filler, MD  Internal Medicine [phone]      ANTICOAG TODAY: Anticoagulation Summary as of 07/13/2013   INR goal 2.0-3.0  Selected INR 1.80! (07/13/2013)  Next INR check 07/27/2013  Target end date Indefinite   Indications  Long-term (current) use of anticoagulants [V58.61] A-fib (Resolved) [427.31]      Anticoagulation Episode Summary   [facility]   Preferred lab    Send INR reminders to ANTICOAG IMP   Comments     Anticoagulation Care Providers   Provider Role Specialty Phone number   Axel Filler, MD  Internal Medicine [phone]      PATIENT INSTRUCTIONS: Patient Instructions  Patient instructed to take medications as defined in the Anti-coagulation Track section of this encounter.  Patient instructed to take today's dose.  Patient verbalized understanding of these instructions.       FOLLOW-UP Return in 2 weeks (on 07/27/2013) for Follow up INR at [place],  III Pharm.D., CACP

## 2013-07-13 NOTE — Patient Instructions (Signed)
Patient instructed to take medications as defined in the Anti-coagulation Track section of this encounter.  Patient instructed to take today's dose.  Patient verbalized understanding of these instructions.    

## 2013-07-22 ENCOUNTER — Ambulatory Visit (INDEPENDENT_AMBULATORY_CARE_PROVIDER_SITE_OTHER): Payer: BC Managed Care – PPO | Admitting: Cardiology

## 2013-07-22 ENCOUNTER — Encounter: Payer: Self-pay | Admitting: *Deleted

## 2013-07-22 ENCOUNTER — Encounter: Payer: Self-pay | Admitting: Cardiology

## 2013-07-22 VITALS — BP 90/66 | HR 83 | Ht 66.0 in | Wt 190.4 lb

## 2013-07-22 DIAGNOSIS — Z7901 Long term (current) use of anticoagulants: Secondary | ICD-10-CM

## 2013-07-22 DIAGNOSIS — I472 Ventricular tachycardia, unspecified: Secondary | ICD-10-CM

## 2013-07-22 DIAGNOSIS — Z4502 Encounter for adjustment and management of automatic implantable cardiac defibrillator: Secondary | ICD-10-CM

## 2013-07-22 DIAGNOSIS — I421 Obstructive hypertrophic cardiomyopathy: Secondary | ICD-10-CM

## 2013-07-22 DIAGNOSIS — I4729 Other ventricular tachycardia: Secondary | ICD-10-CM

## 2013-07-22 DIAGNOSIS — I4891 Unspecified atrial fibrillation: Secondary | ICD-10-CM

## 2013-07-22 DIAGNOSIS — Z95810 Presence of automatic (implantable) cardiac defibrillator: Secondary | ICD-10-CM

## 2013-07-22 LAB — MDC_IDC_ENUM_SESS_TYPE_INCLINIC
Battery Remaining Longevity: 0 mo
Battery Voltage: 2.42 V
Brady Statistic RV Percent Paced: 13 %
Date Time Interrogation Session: 20150211215742
HIGH POWER IMPEDANCE MEASURED VALUE: 72 Ohm
Implantable Pulse Generator Serial Number: 523975
Lead Channel Pacing Threshold Amplitude: 1 V
Lead Channel Pacing Threshold Amplitude: 1 V
Lead Channel Pacing Threshold Pulse Width: 0.8 ms
Lead Channel Sensing Intrinsic Amplitude: 11.7 mV
Lead Channel Setting Pacing Amplitude: 2.5 V
Lead Channel Setting Pacing Pulse Width: 0.5 ms
Lead Channel Setting Sensing Sensitivity: 0.3 mV
MDC IDC MSMT LEADCHNL RA IMPEDANCE VALUE: 400 Ohm
MDC IDC MSMT LEADCHNL RA SENSING INTR AMPL: 5 mV
MDC IDC MSMT LEADCHNL RV IMPEDANCE VALUE: 390 Ohm
MDC IDC MSMT LEADCHNL RV PACING THRESHOLD PULSEWIDTH: 0.5 ms
MDC IDC SET LEADCHNL RA PACING AMPLITUDE: 2 V
MDC IDC SET ZONE DETECTION INTERVAL: 250 ms
MDC IDC STAT BRADY RA PERCENT PACED: 0 %

## 2013-07-22 NOTE — Patient Instructions (Addendum)
Your physician has recommended that you have a defibrillator CHANGE ON August 12, 2013 AT 1:00 PM. ARRIVE AT 11:00 AM. An implantable cardioverter defibrillator (ICD) is a small device that is placed in your chest or, in rare cases, your abdomen. This device uses electrical pulses or shocks to help control life-threatening, irregular heartbeats that could lead the heart to suddenly stop beating (sudden cardiac arrest). Leads are attached to the ICD that goes into your heart. This is done in the hospital and usually requires an overnight stay. Please see the instruction sheet given to you today for more information.  Your physician has requested that you have an echocardiogram ON 08/03/2013 AT 9:30 AM. Echocardiography is a painless test that uses sound waves to create images of your heart. It provides your doctor with information about the size and shape of your heart and how well your heart's chambers and valves are working. This procedure takes approximately one hour. There are no restrictions for this procedure.  Your physician has recommended you make the following change in your medication: [place]  Your physician recommends that you return for lab work in: [place] 08-10-13 (CBC, BMET, PT-INR)
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
## 2013-07-27 ENCOUNTER — Ambulatory Visit (INDEPENDENT_AMBULATORY_CARE_PROVIDER_SITE_OTHER): Payer: BC Managed Care – PPO | Admitting: Pharmacist

## 2013-07-27 DIAGNOSIS — I4891 Unspecified atrial fibrillation: Secondary | ICD-10-CM

## 2013-07-27 DIAGNOSIS — Z7901 Long term (current) use of anticoagulants: Secondary | ICD-10-CM

## 2013-07-27 LAB — POCT INR: INR: 2.2

## 2013-07-27 NOTE — Patient Instructions (Signed)
Patient instructed to take medications as defined in the Anti-coagulation Track section of this encounter.  Patient instructed to take today's dose.  Patient verbalized understanding of these instructions.    

## 2013-07-27 NOTE — Progress Notes (Signed)
Anti-Coagulation Progress Note  [name] is a 48 y.o. female who is currently on an anti-coagulation regimen.    RECENT RESULTS: Recent results are below, the most recent result is correlated with a dose of 28 mg. per week: Lab Results  Component Value Date   INR 2.20 07/27/2013   INR 1.80 07/13/2013   INR 1.90 06/15/2013    ANTI-COAG DOSE: Anticoagulation Dose Instructions as of 07/27/2013     [name] Tue Wed Thu Fri Sat   New Dose 4 mg 4 mg 4 mg 4 mg 4 mg 4 mg 4 mg       ANTICOAG SUMMARY: Anticoagulation Episode Summary   Current INR goal 2.0-3.0  Next INR check 08/10/2013  INR from last check 2.20 (07/27/2013)  Weekly max dose   Target end date Indefinite  [facility] check [facility]  Preferred lab   Send INR reminders to ANTICOAG IMP   Indications  Long-term (current) use of anticoagulants [V58.61] A-fib (Resolved) [427.31]        Comments       Anticoagulation Care Providers   Provider Role Specialty Phone number   Axel Filler, MD  Internal Medicine [phone]      ANTICOAG TODAY: Anticoagulation Summary as of 07/27/2013   INR goal 2.0-3.0  Selected INR 2.20 (07/27/2013)  Next INR check 08/10/2013  Target end date Indefinite   Indications  Long-term (current) use of anticoagulants [V58.61] A-fib (Resolved) [427.31]      Anticoagulation Episode Summary   INR check location [facility]   Preferred lab    Send INR reminders to ANTICOAG IMP   Comments     Anticoagulation Care Providers   Provider Role Specialty Phone number   Axel Filler, MD  Internal Medicine [phone]      PATIENT INSTRUCTIONS: Patient Instructions  Patient instructed to take medications as defined in the Anti-coagulation Track section of this encounter.  Patient instructed to take today's dose.  Patient verbalized understanding of these instructions.       FOLLOW-UP Return in 2 weeks (on 08/10/2013) for Follow up INR at [place],  III Pharm.D., CACP

## 2013-07-31 ENCOUNTER — Encounter: Payer: Self-pay | Admitting: Internal Medicine

## 2013-08-03 ENCOUNTER — Ambulatory Visit (HOSPITAL_COMMUNITY): Payer: BC Managed Care – PPO | Attending: Cardiology | Admitting: Radiology

## 2013-08-03 ENCOUNTER — Encounter: Payer: Self-pay | Admitting: Cardiology

## 2013-08-03 DIAGNOSIS — Z95810 Presence of automatic (implantable) cardiac defibrillator: Secondary | ICD-10-CM | POA: Insufficient documentation

## 2013-08-03 DIAGNOSIS — I079 Rheumatic tricuspid valve disease, unspecified: Secondary | ICD-10-CM | POA: Insufficient documentation

## 2013-08-03 DIAGNOSIS — I4892 Unspecified atrial flutter: Secondary | ICD-10-CM | POA: Insufficient documentation

## 2013-08-03 DIAGNOSIS — I4729 Other ventricular tachycardia: Secondary | ICD-10-CM

## 2013-08-03 DIAGNOSIS — I4891 Unspecified atrial fibrillation: Secondary | ICD-10-CM | POA: Insufficient documentation

## 2013-08-03 DIAGNOSIS — I059 Rheumatic mitral valve disease, unspecified: Secondary | ICD-10-CM | POA: Insufficient documentation

## 2013-08-03 DIAGNOSIS — I421 Obstructive hypertrophic cardiomyopathy: Secondary | ICD-10-CM

## 2013-08-03 DIAGNOSIS — I422 Other hypertrophic cardiomyopathy: Secondary | ICD-10-CM | POA: Insufficient documentation

## 2013-08-03 DIAGNOSIS — Z7901 Long term (current) use of anticoagulants: Secondary | ICD-10-CM

## 2013-08-03 DIAGNOSIS — I472 Ventricular tachycardia: Secondary | ICD-10-CM

## 2013-08-03 DIAGNOSIS — Z4502 Encounter for adjustment and management of automatic implantable cardiac defibrillator: Secondary | ICD-10-CM

## 2013-08-03 NOTE — Progress Notes (Signed)
Echocardiogram performed.  

## 2013-08-05 ENCOUNTER — Telehealth: Payer: Self-pay | Admitting: *Deleted

## 2013-08-05 NOTE — Telephone Encounter (Signed)
Lmtcb, number provided 

## 2013-08-07 ENCOUNTER — Encounter (HOSPITAL_COMMUNITY): Payer: Self-pay | Admitting: Pharmacy Technician

## 2013-08-07 ENCOUNTER — Telehealth: Payer: Self-pay | Admitting: *Deleted

## 2013-08-07 NOTE — Telephone Encounter (Signed)
Lmtcb, number and hours provided

## 2013-08-10 ENCOUNTER — Telehealth: Payer: Self-pay | Admitting: Internal Medicine

## 2013-08-10 ENCOUNTER — Other Ambulatory Visit (INDEPENDENT_AMBULATORY_CARE_PROVIDER_SITE_OTHER): Payer: BC Managed Care – PPO

## 2013-08-10 ENCOUNTER — Ambulatory Visit (INDEPENDENT_AMBULATORY_CARE_PROVIDER_SITE_OTHER): Payer: BC Managed Care – PPO | Admitting: Pharmacist

## 2013-08-10 DIAGNOSIS — Z7901 Long term (current) use of anticoagulants: Secondary | ICD-10-CM

## 2013-08-10 DIAGNOSIS — I4891 Unspecified atrial fibrillation: Secondary | ICD-10-CM

## 2013-08-10 LAB — BASIC METABOLIC PANEL WITH GFR
BUN: 13 mg/dL (ref 6–23)
CHLORIDE: 103 meq/L (ref 96–112)
CO2: 26 meq/L (ref 19–32)
Calcium: 9.3 mg/dL (ref 8.4–10.5)
Creat: 0.78 mg/dL (ref 0.50–1.10)
GFR, Est African American: >89 mL/min
GFR, Est Non African American: >89 mL/min
Glucose, Bld: 126 mg/dL — ABNORMAL HIGH (ref 70–99)
POTASSIUM: 3.8 meq/L (ref 3.5–5.3)
SODIUM: 141 meq/L (ref 135–145)

## 2013-08-10 LAB — CBC
HEMATOCRIT: 39.7 % (ref 36.0–46.0)
Hemoglobin: 12.9 g/dL (ref 12.0–15.0)
MCH: 29.9 pg (ref 26.0–34.0)
MCHC: 32.5 g/dL (ref 30.0–36.0)
MCV: 91.9 fL (ref 78.0–100.0)
Platelets: 238 10*3/uL (ref 150–400)
RBC: 4.32 MIL/uL (ref 3.87–5.11)
RDW: 13.2 % (ref 11.5–15.5)
WBC: 5.2 10*3/uL (ref 4.0–10.5)

## 2013-08-10 LAB — PROTIME-INR
INR: 1.77 — ABNORMAL HIGH (ref <1.50)
Prothrombin Time: 20.1 seconds — ABNORMAL HIGH (ref 11.6–15.2)

## 2013-08-10 LAB — POCT INR: INR: 2.2

## 2013-08-10 NOTE — Patient Instructions (Signed)
Patient instructed to take medications as defined in the Anti-coagulation Track section of this encounter.  Patient instructed to OMIT the next THREE days of warfarin. She is having a cardiac procedure performed on Wednesday.  Patient verbalized understanding of these instructions.

## 2013-08-10 NOTE — Telephone Encounter (Signed)
New message    Patient calling back to speak with Jerene Pitch E from Friday . Patient is aware that she is not in the office today . Forwarding message to nurse.

## 2013-08-10 NOTE — Progress Notes (Signed)
Anti-Coagulation Progress Note  [name] is a 48 y.o. female who is currently on an anti-coagulation regimen.    RECENT RESULTS: Recent results are below, the most recent result is correlated with a dose of 28 mg. per week:  Patient will HOLD/OMIT today, tomorrow and Wednesdays dose of warfarin in advance of a planned interventional cardiac procedure on Wednesday 4-MAR-15.  Lab Results  Component Value Date   INR 2.2 08/10/2013   INR 2.20 07/27/2013   INR 1.80 07/13/2013    ANTI-COAG DOSE: Anticoagulation Dose Instructions as of 08/10/2013     [name] Tue Wed Thu Fri Sat   New Dose 4 mg 4 mg 4 mg 4 mg 4 mg 4 mg 4 mg       ANTICOAG SUMMARY: Anticoagulation Episode Summary   Current INR goal 2.0-3.0  Next INR check 08/24/2013  INR from last check 2.2 (08/10/2013)  Weekly max dose   Target end date Indefinite  [facility]  Preferred lab   Send INR reminders to ANTICOAG IMP   Indications  Long-term (current) use of anticoagulants [V58.61] A-fib (Resolved) [427.31]        Comments       Anticoagulation Care Providers   Provider Role Specialty Phone number   Axel Filler, MD  Internal Medicine [phone]      ANTICOAG TODAY: Anticoagulation Summary as of 08/10/2013   INR goal 2.0-3.0  Selected INR 2.2 (08/10/2013)  Next INR check 08/24/2013  Target end date Indefinite   Indications  Long-term (current) use of anticoagulants [V58.61] A-fib (Resolved) [427.31]      Anticoagulation Episode Summary   INR check [facility]   Preferred lab    Send INR reminders to ANTICOAG IMP   Comments     Anticoagulation Care Providers   Provider Role Specialty Phone number   Axel Filler, MD  Internal Medicine [phone]      PATIENT INSTRUCTIONS: Patient Instructions  Patient instructed to take medications as defined in the Anti-coagulation Track section of this encounter.  Patient instructed to OMIT the next THREE days of  warfarin. She is having a cardiac procedure performed on Wednesday.  Patient verbalized understanding of these instructions.       FOLLOW-UP Return in 2 weeks (on 08/24/2013) for Follow up INR at 4:30PM.  [name], III Pharm.D., CACP

## 2013-08-10 NOTE — Telephone Encounter (Signed)
Patient tells me that [name]/someone from our office called her Friday, she thinks it about echo results.  I see that [name] called her last week, but does not specify why. Routing to [place] (her assist) to follow up.

## 2013-08-11 ENCOUNTER — Telehealth: Payer: Self-pay | Admitting: *Deleted

## 2013-08-11 MED ORDER — CEFAZOLIN SODIUM-DEXTROSE 2-3 GM-% IV SOLR
2.0000 g | INTRAVENOUS | Status: AC
  Filled 2013-08-11 (×2): qty 50

## 2013-08-11 MED ORDER — SODIUM CHLORIDE 0.9 % IR SOLN
80.0000 mg | Status: AC
  Filled 2013-08-11: qty 2

## 2013-08-11 NOTE — Progress Notes (Signed)
Indication: h/o atrial fibrillation.  Duration: Lifelong.  INR:  At target.  I agree with Dr. Gladstone Pih assessment and plan as documented.

## 2013-08-11 NOTE — Telephone Encounter (Signed)
Follow up     Returned nurses call regarding procedure scheduled for tomorrow

## 2013-08-11 NOTE — Telephone Encounter (Signed)
Left message on personal answering machine informing pt of procedure time change tomorrow -- procedure changed to 3pm, be at hospital at 1:30pm.

## 2013-08-11 NOTE — Telephone Encounter (Signed)
I didn't call her last week but please let me know if you or she needs my help with questions or concerns.  Thanks! -Jerene Pitch

## 2013-08-11 NOTE — Telephone Encounter (Signed)
Confirmed pt knows new procedure time.  She verbalized understanding.

## 2013-08-11 NOTE — Progress Notes (Signed)
Results of labs for [facility] faxed to Ileene Hutchinson, [place] by [facility] at 9:15am on 08/11/13. CBC, BMP, PT/INR

## 2013-08-12 ENCOUNTER — Encounter (HOSPITAL_COMMUNITY)
Admission: RE | Disposition: A | Discharge status: Home / Self Care | Payer: Self-pay | Source: Ambulatory Visit | Attending: Internal Medicine

## 2013-08-12 ENCOUNTER — Ambulatory Visit (HOSPITAL_COMMUNITY)
Admission: RE | Admit: 2013-08-12 | Discharge: 2013-08-12 | Disposition: A | Discharge status: Home / Self Care | Payer: BC Managed Care – PPO | Source: Ambulatory Visit | Attending: Internal Medicine | Admitting: Internal Medicine

## 2013-08-12 ENCOUNTER — Encounter (HOSPITAL_COMMUNITY): Payer: Self-pay | Admitting: *Deleted

## 2013-08-12 DIAGNOSIS — I472 Ventricular tachycardia: Secondary | ICD-10-CM

## 2013-08-12 DIAGNOSIS — Z4502 Encounter for adjustment and management of automatic implantable cardiac defibrillator: Secondary | ICD-10-CM

## 2013-08-12 DIAGNOSIS — K219 Gastro-esophageal reflux disease without esophagitis: Secondary | ICD-10-CM | POA: Insufficient documentation

## 2013-08-12 DIAGNOSIS — I421 Obstructive hypertrophic cardiomyopathy: Secondary | ICD-10-CM | POA: Insufficient documentation

## 2013-08-12 DIAGNOSIS — I4891 Unspecified atrial fibrillation: Secondary | ICD-10-CM | POA: Insufficient documentation

## 2013-08-12 DIAGNOSIS — F411 Generalized anxiety disorder: Secondary | ICD-10-CM | POA: Insufficient documentation

## 2013-08-12 DIAGNOSIS — Z95810 Presence of automatic (implantable) cardiac defibrillator: Secondary | ICD-10-CM

## 2013-08-12 DIAGNOSIS — I4729 Other ventricular tachycardia: Secondary | ICD-10-CM

## 2013-08-12 DIAGNOSIS — Z7901 Long term (current) use of anticoagulants: Secondary | ICD-10-CM | POA: Insufficient documentation

## 2013-08-12 HISTORY — PX: IMPLANTABLE CARDIOVERTER DEFIBRILLATOR (ICD) GENERATOR CHANGE: SHX5469

## 2013-08-12 LAB — PROTIME-INR
INR: 1.5 — ABNORMAL HIGH (ref 0.00–1.49)
Prothrombin Time: 17.7 seconds — ABNORMAL HIGH (ref 11.6–15.2)

## 2013-08-12 LAB — SURGICAL PCR SCREEN
MRSA, PCR: NEGATIVE
STAPHYLOCOCCUS AUREUS: NEGATIVE

## 2013-08-12 SURGERY — ICD GENERATOR CHANGE
Anesthesia: LOCAL

## 2013-08-12 MED ORDER — LIDOCAINE HCL (PF) 1 % IJ SOLN
INTRAMUSCULAR | Status: AC
  Filled 2013-08-12: qty 60

## 2013-08-12 MED ORDER — CHLORHEXIDINE GLUCONATE 4 % EX LIQD
60.0000 mL | Freq: Once | CUTANEOUS | Status: DC
  Filled 2013-08-12: qty 60

## 2013-08-12 MED ORDER — FENTANYL CITRATE 0.05 MG/ML IJ SOLN
INTRAMUSCULAR | Status: AC
  Filled 2013-08-12: qty 2

## 2013-08-12 MED ORDER — MUPIROCIN 2 % EX OINT
TOPICAL_OINTMENT | CUTANEOUS | Status: DC
Start: 2013-08-12 — End: 2013-08-14
  Filled 2013-08-12: qty 22

## 2013-08-12 MED ORDER — SODIUM CHLORIDE 0.9 % IV SOLN
INTRAVENOUS | Status: DC
  Administered 2013-08-12: 20 mL/h via INTRAVENOUS

## 2013-08-12 MED ORDER — MIDAZOLAM HCL 5 MG/5ML IJ SOLN
INTRAMUSCULAR | Status: AC
  Filled 2013-08-12: qty 5
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
## 2013-08-12 NOTE — CV Procedure (Deleted)
[name] [id number]  [id number]  Preop Dx: prev pm pre generatoro replacement Postop Dx same/ stenotic but patent inominate vein  Procedure:central venogram  Cx: None     Virl Axe, MD 08/12/2013 5:20 PM

## 2013-08-12 NOTE — CV Procedure (Signed)
Preoperative diagnosis HCM syncope VT and prior ICD at [facility] Postoperative diagnosis same/   Procedure: Generator replacement     Following informed consent the patient was brought to the electrophysiology laboratory in place of the fluoroscopic table in the supine position after routine prep and drape lidocaine was infiltrated in the region of the previous incision and carried down to later the device pocket using sharp dissection and electrocautery. The pocket was opened the device was freed up and was explanted.  Interrogation of the previously implanted ICD ventricular lead   demonstrated an R wave of 22.5  millivolts., and impedance of 302 ohms, and a pacing threshold of 0.7 volts at 0.5 msec.        The previously implanted atrial lead 4.3 demonstrated a P-wave amplitude of 4.3 milllivolts  and impedance of  408 ohms, and a pacing threshold of n/a volts    The leads were inspected. Repair was needed of both leadsThe leads were then attached to a [employer] Jude  pulse generator, serial number [id number] .       High voltage impedances were 39 ohms  The pocket was irrigated with antibiotic containing saline solution hemostasis was assured and the leads and the device were placed in the pocket. The wound was then closed in 2 layers in normal fashion.  The patient tolerated the procedure without apparent complication.  DFT testing was not performed  Virl Axe   \

## 2013-08-12 NOTE — Discharge Instructions (Signed)
Pacemaker Battery Change A pacemaker battery usually lasts 4 to 12 years. Once or twice per year, you will be asked to visit your health care provider to have a full evaluation of your pacemaker. When a battery needs to be replaced, the entire pacemaker is replaced so that you can benefit from new circuitry and any new features that have been added to pacemakers. Most often, this procedure is very simple because the leads are already in place.  There are many things that affect how long a pacemaker battery will last, including:   The age of the pacemaker.   The number of leads (1, 2, or 3).   The pacemaker work load. If the pacemaker is helping the heart more often, the battery will not last as long as it would if the pacemaker did not need to help the heart.   Power (voltage) settings. LET [facility] CARE PROVIDER KNOW ABOUT:   Any allergies you have.   All medicines you are taking, including vitamins, herbs, eye drops, creams, and over-the-counter medicines.   Previous problems you or members of your family have had with the use of anesthetics.   Any blood disorders you have.   Previous surgeries you have had, especially since your last pacemaker placement.   Medical conditions you have.   Possible pregnancy, if applicable.  Symptoms of chest pain, trouble breathing, palpitations, lightheadedness, or feelings of an abnormal or irregular heartbeat. RISKS AND COMPLICATIONS  Generally, this is a safe procedure. However, as with any procedure, complications can occur. Possible complications include:   Bleeding.   Bruising of the skin around where the incision was made.   Pain at the incision site.   Pulling apart of the skin at the incision site.   Infection.   Allergic reaction to anesthetics or other medicines used during the procedure.  Diabetics may have a temporary increase in their blood sugar after any surgical procedure.  BEFORE THE PROCEDURE   Wash  all of the skin around the area of the chest where the pacemaker is located.   Ask your health care provider for help with any medicine adjustments before the pacemaker is replaced.   Unless advised otherwise, do not eat or drink after midnight on the night before the procedure. You may drink water to take your medicine as you normally would or as directed. PROCEDURE   After giving medicine to numb the skin, your health care provider will make a cut to reopen the pocket holding the pacemaker.   The old pacemaker will be disconnected from its leads.   The leads will be tested.   If needed, the leads will be replaced. If the leads are functioning properly, the new pacemaker may be connected to the existing leads.  A heart monitor and the pacemaker programmer will be used to make sure that the new pacemaker is working properly.  The incision site is then closed. A dressing is placed over the pacemaker site. The dressing is removed 24 to 48 hours afterwards. AFTER THE PROCEDURE   You will be taken to a recovery area after the new pacemaker implant is completed. Your vital signs such as blood pressure, heart rate, breathing, and oxygen levels will be monitored.  Your health care provider will tell you when you will need to next test your pacemaker or when to return to the office for follow-up for removal of stitches. Document Released: 09/05/2006 Document Revised: 01/28/2013 Document Reviewed: 12/10/2012 [facility] Patient Information 2014 [place].

## 2013-08-12 NOTE — Interval H&P Note (Signed)
ICD Criteria  Current LVEF:45% ;Obtained < 1 month ago.  NYHA Functional Classification: Class II  Heart Failure History:  Yes, Duration of heart failure since onset is > 9 months  Non-Ischemic Dilated Cardiomyopathy History:  No.  Atrial Fibrillation/Atrial Flutter:  Yes, A-Fib/A-Flutter type: Persistent (>7 days).  Ventricular Tachycardia History:  Yes, Hemodynamic status unknown. VT Type is unknown.  Cardiac Arrest History:  No  History of Syndromes with Risk of Sudden Death:  Yes, Hypertrophic cardiomyopathy  Previous ICD:  Yes, ICD Type:  Dual, Reason for ICD:  Primary prevention.  LVEF is not available  Electrophysiology Study: No.  Prior MI: No.  PPM: No.  OSA:  No  Patient Life Expectancy of >=1 year: Yes.  Anticoagulation Therapy:  Patient is on anticoagulation therapy, anticoagulation was held prior to procedure.   Beta Blocker Therapy:  Yes.   Ace Inhibitor/ARB Therapy:  No,  Not indicated History and Physical Interval Note:  08/12/2013 3:39 PM  [name]  has presented today for surgery, with the diagnosis of eol  The various methods of treatment have been discussed with the patient and family. After consideration of risks, benefits and other options for treatment, the patient has consented to  Procedure(s): ICD GENERATOR CHANGE (N/A) as a surgical intervention .  The patient's history has been reviewed, patient examined, no change in status, stable for surgery.  I have reviewed the patient's chart and labs.  Questions were answered to the patient's satisfaction.     Virl Axe

## 2013-08-17 ENCOUNTER — Ambulatory Visit: Payer: BC Managed Care – PPO | Admitting: Internal Medicine

## 2013-08-18 ENCOUNTER — Encounter (HOSPITAL_COMMUNITY): Payer: Self-pay | Admitting: *Deleted

## 2013-08-24 ENCOUNTER — Ambulatory Visit (INDEPENDENT_AMBULATORY_CARE_PROVIDER_SITE_OTHER): Payer: BC Managed Care – PPO | Admitting: Pharmacist

## 2013-08-24 DIAGNOSIS — I4891 Unspecified atrial fibrillation: Secondary | ICD-10-CM

## 2013-08-24 DIAGNOSIS — Z7901 Long term (current) use of anticoagulants: Secondary | ICD-10-CM

## 2013-08-24 LAB — POCT INR: INR: 2.5

## 2013-08-24 NOTE — Progress Notes (Signed)
Indication: Atrial fibrillation Duration: Lifelong INR: At target. Dr. Groce's assessment and plan were reviewed and I agree with his documentation. 

## 2013-08-24 NOTE — Patient Instructions (Signed)
Patient instructed to take medications as defined in the Anti-coagulation Track section of this encounter.  Patient instructed to take today's dose.  Patient verbalized understanding of these instructions.    

## 2013-08-24 NOTE — Progress Notes (Signed)
Anti-Coagulation Progress Note  [name] is a 48 y.o. female who is currently on an anti-coagulation regimen.    RECENT RESULTS: Recent results are below, the most recent result is correlated with a dose of 28 mg. per week: Lab Results  Component Value Date   INR 2.50 08/24/2013   INR 1.50* 08/12/2013   INR 2.2 08/10/2013    ANTI-COAG DOSE: Anticoagulation Dose Instructions as of 08/24/2013     [name] Tue Wed Thu Fri Sat   New Dose 4 mg 4 mg 4 mg 4 mg 4 mg 4 mg 4 mg       ANTICOAG SUMMARY: Anticoagulation Episode Summary   Current INR goal 2.0-3.0  Next INR check 09/21/2013  INR from last check 2.50 (08/24/2013)  Weekly max dose   Target end date Indefinite  [facility]  Preferred lab   Send INR reminders to ANTICOAG IMP   Indications  Long-term (current) use of anticoagulants [V58.61] A-fib (Resolved) [427.31]        Comments       Anticoagulation Care Providers   Provider Role Specialty Phone number   Axel Filler, MD  Internal Medicine [phone]      ANTICOAG TODAY: Anticoagulation Summary as of 08/24/2013   INR goal 2.0-3.0  Selected INR 2.50 (08/24/2013)  Next INR check 09/21/2013  Target end date Indefinite   Indications  Long-term (current) use of anticoagulants [V58.61] A-fib (Resolved) [427.31]      Anticoagulation Episode Summary   [facility]   Preferred lab    Send INR reminders to ANTICOAG IMP   Comments     Anticoagulation Care Providers   Provider Role Specialty Phone number   Axel Filler, MD  Internal Medicine [phone]      PATIENT INSTRUCTIONS: Patient Instructions  Patient instructed to take medications as defined in the Anti-coagulation Track section of this encounter.  Patient instructed to take today's dose.  Patient verbalized understanding of these instructions.       FOLLOW-UP Return in 4 weeks (on 09/21/2013) for Follow up INR at 4:30PM.  [name],  III Pharm.D., CACP

## 2013-08-25 ENCOUNTER — Ambulatory Visit (INDEPENDENT_AMBULATORY_CARE_PROVIDER_SITE_OTHER): Payer: BC Managed Care – PPO | Admitting: *Deleted

## 2013-08-25 ENCOUNTER — Encounter: Payer: Self-pay | Admitting: Internal Medicine

## 2013-08-25 ENCOUNTER — Telehealth: Payer: Self-pay | Admitting: *Deleted

## 2013-08-25 DIAGNOSIS — I5032 Chronic diastolic (congestive) heart failure: Secondary | ICD-10-CM

## 2013-08-25 DIAGNOSIS — I421 Obstructive hypertrophic cardiomyopathy: Secondary | ICD-10-CM

## 2013-08-25 LAB — MDC_IDC_ENUM_SESS_TYPE_INCLINIC
Battery Remaining Longevity: 84 mo
Brady Statistic RA Percent Paced: 73 %
Brady Statistic RV Percent Paced: 3.4 %
HighPow Impedance: 68.625
HighPow Impedance: 69 Ohm
Implantable Pulse Generator Serial Number: 7170419
Lead Channel Impedance Value: 362.5 Ohm
Lead Channel Impedance Value: 387.5 Ohm
Lead Channel Pacing Threshold Pulse Width: 0.5 ms
Lead Channel Sensing Intrinsic Amplitude: 2.4 mV
Lead Channel Setting Pacing Amplitude: 2 V
Lead Channel Setting Pacing Pulse Width: 0.5 ms
MDC IDC MSMT LEADCHNL RA PACING THRESHOLD AMPLITUDE: 0.5 V
MDC IDC MSMT LEADCHNL RA PACING THRESHOLD PULSEWIDTH: 0.8 ms
MDC IDC MSMT LEADCHNL RV PACING THRESHOLD AMPLITUDE: 1 V
MDC IDC MSMT LEADCHNL RV SENSING INTR AMPL: 11.9 mV
MDC IDC SESS DTM: 20150317113143
MDC IDC SET LEADCHNL RV PACING AMPLITUDE: 2.5 V
MDC IDC SET LEADCHNL RV SENSING SENSITIVITY: 0.5 mV
Zone Setting Detection Interval: 250 ms

## 2013-08-25 NOTE — Telephone Encounter (Signed)
Spoke with pt's sister this morning about another matter - sister tells me that pt noticed pea size hole from wound this morning, not draining. Called pt to discuss opening at incision site.  Wound appt made for 11 am this morning to check incision/opening. Pt agreeable to plan.

## 2013-08-25 NOTE — Progress Notes (Signed)
Wound check appointment.  Wound without redness or edema.  0.5cm open area on distal end of the incision noted, steri strip applied.  Normal device function. Thresholds, sensing, and impedances consistent with implant measurements. Device programmed at 3.5V for extra safety margin until 3 month visit. Histogram distribution appropriate for patient and level of activity. 40mode switches, 20%, + coumadin.  No ventricular arrhythmias noted. Patient educated about wound care, arm mobility, lifting restrictions, shock plan. ROV in 3 months with implanting physician.  ROV 3/23 for a recheck of her wound.

## 2013-08-26 ENCOUNTER — Ambulatory Visit (INDEPENDENT_AMBULATORY_CARE_PROVIDER_SITE_OTHER): Payer: BC Managed Care – PPO | Admitting: Internal Medicine

## 2013-08-26 ENCOUNTER — Encounter: Payer: Self-pay | Admitting: Internal Medicine

## 2013-08-26 VITALS — BP 98/68 | HR 81 | Temp 98.9°F | Ht 65.0 in | Wt 187.2 lb

## 2013-08-26 DIAGNOSIS — R739 Hyperglycemia, unspecified: Secondary | ICD-10-CM

## 2013-08-26 DIAGNOSIS — R7309 Other abnormal glucose: Secondary | ICD-10-CM

## 2013-08-26 DIAGNOSIS — I4891 Unspecified atrial fibrillation: Secondary | ICD-10-CM

## 2013-08-26 DIAGNOSIS — I5032 Chronic diastolic (congestive) heart failure: Secondary | ICD-10-CM

## 2013-08-26 LAB — POCT GLYCOSYLATED HEMOGLOBIN (HGB A1C): HEMOGLOBIN A1C: 5.7

## 2013-08-26 NOTE — Assessment & Plan Note (Addendum)
Assessment: Patient is doing well; she has occasional brief episodes of palpitations.  She is currently on Tikosyn (dofetilide) 500 mcg 2 times daily, diltiazem CD 180 mg daily, furosemide 20 mg daily, and metoprolol XL 50 mg twice a day; her cardiac medications are managed at [facility] by Dr. Willis Modena.  Plan: Continue current medications; follow up with Dr. Caryl Comes and Dr. Willis Modena as scheduled

## 2013-08-26 NOTE — Progress Notes (Signed)
   Subjective:    Patient ID: [name], female    DOB: [birth year], 48 y.o.   MRN: [id number]  HPI Patient returns for followup of her atrial fibrillation, chronic diastolic heart failure, and other medical problems.  She is doing well, without any acute complaints.  She reports that she is compliant with her medications.  She reports occasional very brief episodes of palpitations, but these are much improved since she underwent the convergent procedure at [facility] in 2012.  She is followed there by cardiologist Dr. Willis Modena, and locally by Dr. Virl Axe.    Review of Systems  Constitutional: Negative for fever, chills and diaphoresis.  HENT: Negative for hearing loss.   Eyes: Negative for visual disturbance.  Respiratory: Negative for cough, shortness of breath and wheezing.   Cardiovascular: Positive for palpitations (Occasional, brief). Negative for chest pain and leg swelling.  Gastrointestinal: Negative for nausea, vomiting, abdominal pain and blood in stool.  Genitourinary: Negative for dysuria, frequency and difficulty urinating.  Musculoskeletal: Negative for arthralgias and myalgias.  Neurological: Negative for dizziness and syncope.       Objective:   Physical Exam  Constitutional: No distress.  Cardiovascular: Normal rate, regular rhythm, S1 normal and S2 normal.  Exam reveals no S3 and no S4.   Murmur heard.  Systolic murmur is present with a grade of 2/6  Pulmonary/Chest: Effort normal and breath sounds normal. No respiratory distress. She has no wheezes. She has no rales.  Abdominal: Soft. Bowel sounds are normal. She exhibits no distension. There is no tenderness. There is no rebound and no guarding.  Musculoskeletal: She exhibits no edema.        Assessment & Plan:

## 2013-08-26 NOTE — Assessment & Plan Note (Signed)
Hemoglobin A1C  Date Value Ref Range Status  08/26/2013 5.7   Final    Glucose, Bld  Date Value Ref Range Status  08/10/2013 126* 70 - 99 mg/dL Final  05/29/2013 88  70 - 99 mg/dL Final  01/18/2013 133* 70 - 99 mg/dL Final  01/17/2013 92  70 - 99 mg/dL Final     Assessment: Patient's glucose was 126 on 08/10/2013; review of her labs shows occasional elevated blood sugars over the past few years.  Patient has no history of diabetes, and no family history of diabetes in first-degree relatives.  Her hemoglobin A1c today is 5.7, which does not support a diagnosis of diabetes.  Plan: Patient will return for fasting glucose within one week.  I discussed with her the benefits of gradual weight loss, exercise, and following a diet that avoids concentrated sweets.

## 2013-08-26 NOTE — Patient Instructions (Signed)
Please return within one week for a fasting glucose measurement.

## 2013-08-26 NOTE — Assessment & Plan Note (Addendum)
Assessment: Patient is doing well on furosemide 20 mg daily in addition to her other cardiac medications.  Plan: Continue current regimen.  

## 2013-08-28 ENCOUNTER — Other Ambulatory Visit (INDEPENDENT_AMBULATORY_CARE_PROVIDER_SITE_OTHER): Payer: BC Managed Care – PPO

## 2013-08-28 DIAGNOSIS — R739 Hyperglycemia, unspecified: Secondary | ICD-10-CM

## 2013-08-28 DIAGNOSIS — R7309 Other abnormal glucose: Secondary | ICD-10-CM

## 2013-08-28 LAB — GLUCOSE, RANDOM: GLUCOSE: 98 mg/dL (ref 70–99)

## 2013-08-31 ENCOUNTER — Ambulatory Visit (INDEPENDENT_AMBULATORY_CARE_PROVIDER_SITE_OTHER): Payer: BC Managed Care – PPO | Admitting: *Deleted

## 2013-08-31 DIAGNOSIS — I472 Ventricular tachycardia, unspecified: Secondary | ICD-10-CM

## 2013-08-31 DIAGNOSIS — I4729 Other ventricular tachycardia: Secondary | ICD-10-CM

## 2013-08-31 NOTE — Progress Notes (Signed)
Wound recheck from device change out done 08/12/13.  Area healing well without redness or edema.  Follow up as scheduled.

## 2013-09-02 IMAGING — CR DG CHEST 2V
2 series · 2 of 2 positions shown · non-contrast
Comparison: November 10, 2010

CLINICAL DATA: Appendicitis

CHEST - 2 VIEW

[w chest pa]
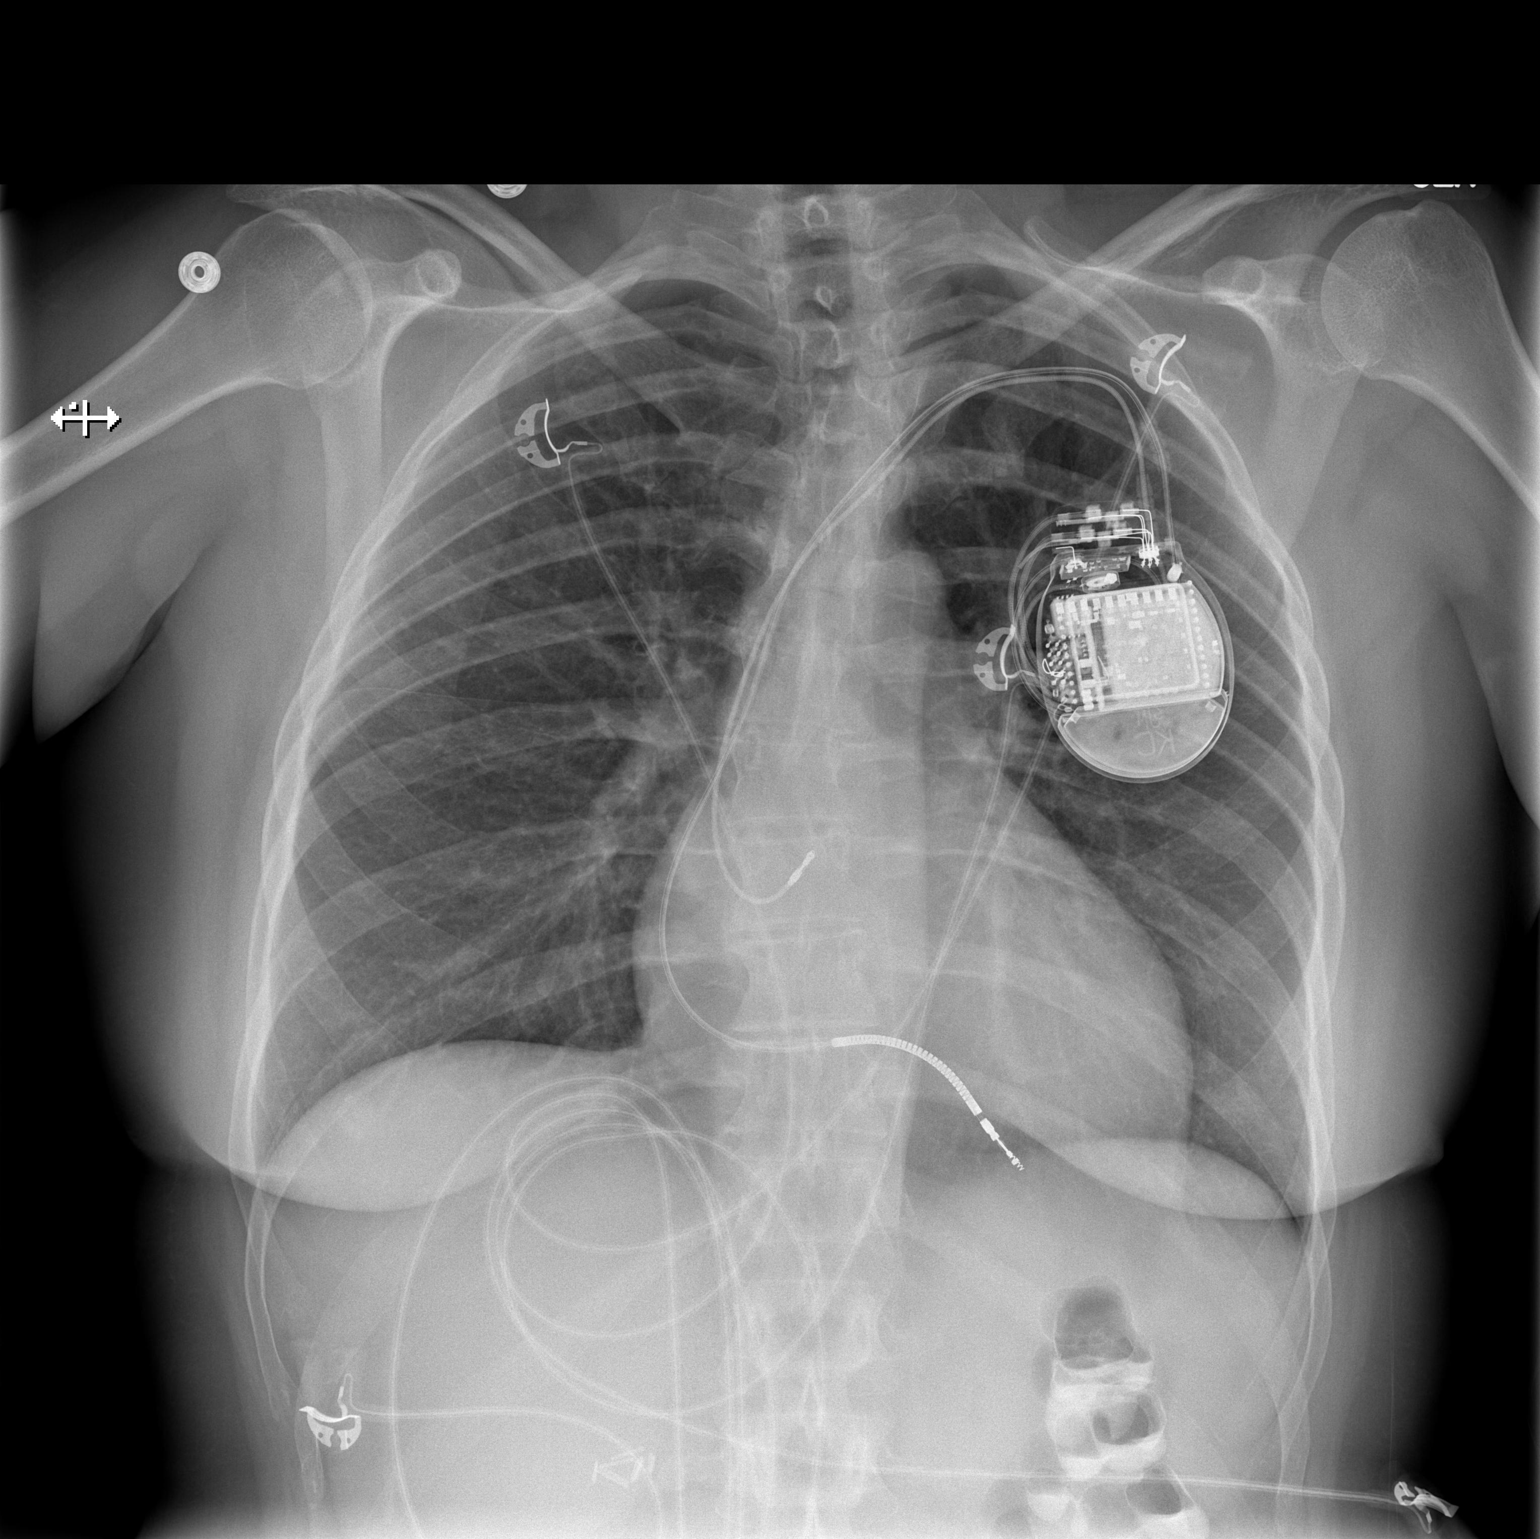

[w chest lat]
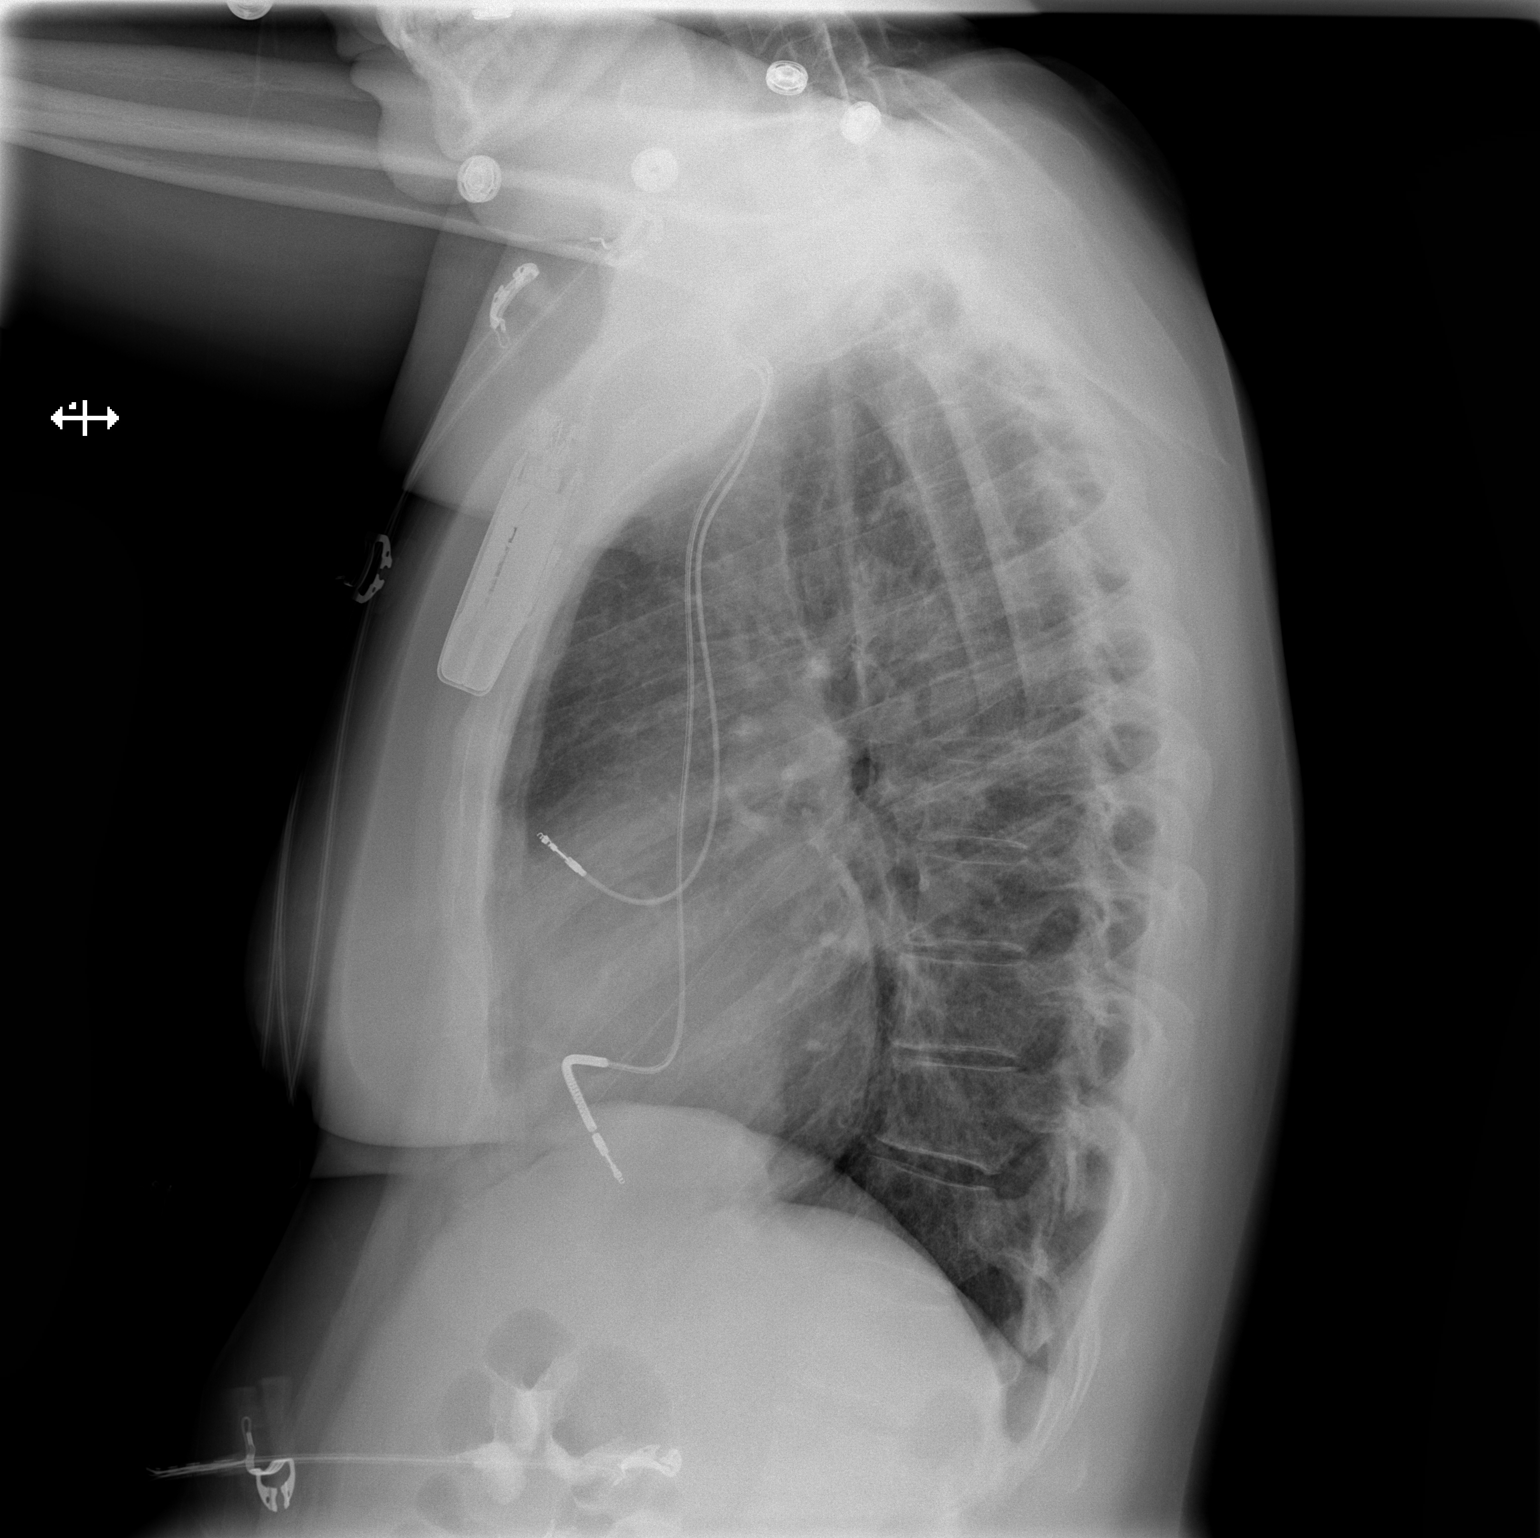

[2 of 2 positions shown; findings below may reference images not displayed]

FINDINGS: Lungs clear.  Heart size and pulmonary vascularity are
normal.  No adenopathy.  Pacemaker leads are attached to the right
atrium and middle cardiac vein.  No pneumothorax.

There is mid thoracic dextroscoliosis.
IMPRESSION: No edema or consolidation.

## 2013-09-09 ENCOUNTER — Encounter: Payer: Self-pay | Admitting: Internal Medicine

## 2013-09-21 ENCOUNTER — Ambulatory Visit (INDEPENDENT_AMBULATORY_CARE_PROVIDER_SITE_OTHER): Payer: BC Managed Care – PPO | Admitting: Pharmacist

## 2013-09-21 DIAGNOSIS — I4891 Unspecified atrial fibrillation: Secondary | ICD-10-CM

## 2013-09-21 DIAGNOSIS — Z7901 Long term (current) use of anticoagulants: Secondary | ICD-10-CM

## 2013-09-21 LAB — POCT INR: INR: 1.8

## 2013-09-21 NOTE — Patient Instructions (Signed)
Patient instructed to take medications as defined in the Anti-coagulation Track section of this encounter.  Patient instructed to take today's dose.  Patient verbalized understanding of these instructions.    

## 2013-09-21 NOTE — Progress Notes (Signed)
Indication: Atrial fibrillation Duration: Lifelong INR: Below target. Dr. Gladstone Pih assessment and plan were reviewed and I agree with his documentation.

## 2013-09-21 NOTE — Progress Notes (Signed)
Anti-Coagulation Progress Note  [name] is a 48 y.o. female who is currently on an anti-coagulation regimen.    RECENT RESULTS: Recent results are below, the most recent result is correlated with a dose of 28 mg. per week: Lab Results  Component Value Date   INR 1.80 09/21/2013   INR 2.50 08/24/2013   INR 1.50* 08/12/2013    ANTI-COAG DOSE: Anticoagulation Dose Instructions as of 09/21/2013     [name] Tue Wed Thu Fri Sat   New Dose 4 mg 6 mg 4 mg 4 mg 6 mg 4 mg 4 mg       ANTICOAG SUMMARY: Anticoagulation Episode Summary   Current INR goal 2.0-3.0  Next INR check 10/19/2013  INR from last check 1.80! (09/21/2013)  Weekly max dose   Target end date Indefinite  [facility]  Preferred lab   Send INR reminders to ANTICOAG IMP   Indications  Long-term (current) use of anticoagulants [V58.61] A-fib (Resolved) [427.31]        Comments       Anticoagulation Care Providers   Provider Role Specialty Phone number   Axel Filler, MD  Internal Medicine [phone]      ANTICOAG TODAY: Anticoagulation Summary as of 09/21/2013   INR goal 2.0-3.0  Selected INR 1.80! (09/21/2013)  Next INR check 10/19/2013  Target end date Indefinite   Indications  Long-term (current) use of anticoagulants [V58.61] A-fib (Resolved) [427.31]      Anticoagulation Episode Summary   [facility]   Preferred lab    Send INR reminders to ANTICOAG IMP   Comments     Anticoagulation Care Providers   Provider Role Specialty Phone number   Axel Filler, MD  Internal Medicine [phone]      PATIENT INSTRUCTIONS: Patient Instructions  Patient instructed to take medications as defined in the Anti-coagulation Track section of this encounter.  Patient instructed to take today's dose.  Patient verbalized understanding of these instructions.       FOLLOW-UP Return in 4 weeks (on 10/19/2013) for Follow up INR at 4:30PM.  [name],  III Pharm.D., CACP

## 2013-10-19 ENCOUNTER — Ambulatory Visit (INDEPENDENT_AMBULATORY_CARE_PROVIDER_SITE_OTHER): Payer: BC Managed Care – PPO | Admitting: Pharmacist

## 2013-10-19 DIAGNOSIS — Z7901 Long term (current) use of anticoagulants: Secondary | ICD-10-CM

## 2013-10-19 DIAGNOSIS — I4891 Unspecified atrial fibrillation: Secondary | ICD-10-CM

## 2013-10-19 LAB — POCT INR: INR: 2.1

## 2013-10-19 NOTE — Progress Notes (Signed)
Anti-Coagulation Progress Note  [name] is a 48 y.o. female who is currently on an anti-coagulation regimen.    RECENT RESULTS: Recent results are below, the most recent result is correlated with a dose of 32 mg. per week: Lab Results  Component Value Date   INR 2.10 10/19/2013   INR 1.80 09/21/2013   INR 2.50 08/24/2013    ANTI-COAG DOSE: Anticoagulation Dose Instructions as of 10/19/2013     [name] Tue Wed Thu Fri Sat   New Dose 4 mg 4 mg 6 mg 4 mg 6 mg 4 mg 4 mg       ANTICOAG SUMMARY: Anticoagulation Episode Summary   Current INR goal 2.0-3.0  Next INR check 11/16/2013  INR from last check 2.10 (10/19/2013)  Weekly max dose   Target end date Indefinite  [facility]  Preferred lab   Send INR reminders to ANTICOAG IMP   Indications  Long-term (current) use of anticoagulants [V58.61] A-fib (Resolved) [427.31]        Comments       Anticoagulation Care Providers   Provider Role Specialty Phone number   Axel Filler, MD  Internal Medicine [phone]      ANTICOAG TODAY: Anticoagulation Summary as of 10/19/2013   INR goal 2.0-3.0  Selected INR 2.10 (10/19/2013)  Next INR check 11/16/2013  Target end date Indefinite   Indications  Long-term (current) use of anticoagulants [V58.61] A-fib (Resolved) [427.31]      Anticoagulation Episode Summary   [facility]   Preferred lab    Send INR reminders to ANTICOAG IMP   Comments     Anticoagulation Care Providers   Provider Role Specialty Phone number   Axel Filler, MD  Internal Medicine [phone]      PATIENT INSTRUCTIONS: Patient Instructions  Patient instructed to take medications as defined in the Anti-coagulation Track section of this encounter.  Patient instructed to take today's dose.  Patient verbalized understanding of these instructions.       FOLLOW-UP Return in 4 weeks (on 11/16/2013) for Follow up INR at 4:30PM.  [name],  III Pharm.D., CACP

## 2013-10-19 NOTE — Patient Instructions (Signed)
Patient instructed to take medications as defined in the Anti-coagulation Track section of this encounter.  Patient instructed to take today's dose.  Patient verbalized understanding of these instructions.    

## 2013-10-20 NOTE — Progress Notes (Signed)
Indication: Atrial fibrillation Duration: Lifelong INR: At target. Dr. Gladstone Pih assessment and plan were reviewed and I agree with his documentation.

## 2013-10-21 ENCOUNTER — Other Ambulatory Visit: Payer: Self-pay | Admitting: Internal Medicine

## 2013-11-16 ENCOUNTER — Ambulatory Visit (INDEPENDENT_AMBULATORY_CARE_PROVIDER_SITE_OTHER): Payer: BC Managed Care – PPO | Admitting: Pharmacist

## 2013-11-16 DIAGNOSIS — I4891 Unspecified atrial fibrillation: Secondary | ICD-10-CM

## 2013-11-16 DIAGNOSIS — Z7901 Long term (current) use of anticoagulants: Secondary | ICD-10-CM

## 2013-11-16 LAB — POCT INR: INR: 3.9

## 2013-11-16 NOTE — Patient Instructions (Signed)
Patient instructed to take medications as defined in the Anti-coagulation Track section of this encounter.  Patient instructed to take today's dose.  Patient verbalized understanding of these instructions.    

## 2013-11-16 NOTE — Progress Notes (Signed)
Anti-Coagulation Progress Note  [name] is a 48 y.o. female who is currently on an anti-coagulation regimen.    RECENT RESULTS: Recent results are below, the most recent result is correlated with a dose of 32 mg. per week: Lab Results  Component Value Date   INR 3.90 11/16/2013   INR 2.10 10/19/2013   INR 1.80 09/21/2013    ANTI-COAG DOSE: Anticoagulation Dose Instructions as of 11/16/2013     [name] Tue Wed Thu Fri Sat   New Dose 6 mg 4 mg 4 mg 4 mg 4 mg 4 mg 4 mg       ANTICOAG SUMMARY: Anticoagulation Episode Summary   Current INR goal 2.0-3.0  Next INR check 12/14/2013  INR from last check 3.90! (11/16/2013)  Weekly max dose   Target end date Indefinite  [facility]  Preferred lab   Send INR reminders to ANTICOAG IMP   Indications  Long-term (current) use of anticoagulants [V58.61] A-fib (Resolved) [427.31]        Comments       Anticoagulation Care Providers   Provider Role Specialty Phone number   Axel Filler, MD  Internal Medicine [phone]      ANTICOAG TODAY: Anticoagulation Summary as of 11/16/2013   INR goal 2.0-3.0  Selected INR 3.90! (11/16/2013)  Next INR check 12/14/2013  Target end date Indefinite   Indications  Long-term (current) use of anticoagulants [V58.61] A-fib (Resolved) [427.31]      Anticoagulation Episode Summary   [facility]   Preferred lab    Send INR reminders to ANTICOAG IMP   Comments     Anticoagulation Care Providers   Provider Role Specialty Phone number   Axel Filler, MD  Internal Medicine [phone]      PATIENT INSTRUCTIONS: Patient Instructions  Patient instructed to take medications as defined in the Anti-coagulation Track section of this encounter.  Patient instructed to take today's dose.  Patient verbalized understanding of these instructions.       FOLLOW-UP Return in 4 weeks (on 12/14/2013) for Follow up INR at 2:15PM.  [name],  III Pharm.D., CACP

## 2013-11-17 ENCOUNTER — Encounter: Payer: BC Managed Care – PPO | Admitting: Cardiology

## 2013-11-25 ENCOUNTER — Encounter: Payer: Self-pay | Admitting: Cardiology

## 2013-12-14 ENCOUNTER — Ambulatory Visit (INDEPENDENT_AMBULATORY_CARE_PROVIDER_SITE_OTHER): Payer: BC Managed Care – PPO | Admitting: Pharmacist

## 2013-12-14 DIAGNOSIS — I4891 Unspecified atrial fibrillation: Secondary | ICD-10-CM

## 2013-12-14 DIAGNOSIS — Z7901 Long term (current) use of anticoagulants: Secondary | ICD-10-CM

## 2013-12-14 LAB — POCT INR: INR: 2.7

## 2013-12-14 MED ORDER — WARFARIN SODIUM 4 MG PO TABS: ORAL_TABLET | ORAL | Status: DC

## 2013-12-14 NOTE — Progress Notes (Signed)
Anti-Coagulation Progress Note  [name] is a 48 y.o. female who is currently on an anti-coagulation regimen.    RECENT RESULTS: Recent results are below, the most recent result is correlated with a dose of 30 mg. per week: Lab Results  Component Value Date   INR 2.70 12/14/2013   INR 3.90 11/16/2013   INR 2.10 10/19/2013    ANTI-COAG DOSE: Anticoagulation Dose Instructions as of 12/14/2013     [name] Tue Wed Thu Fri Sat   New Dose 6 mg 4 mg 4 mg 4 mg 4 mg 4 mg 4 mg       ANTICOAG SUMMARY: Anticoagulation Episode Summary   Current INR goal 2.0-3.0  Next INR check 01/18/2014  INR from last check 2.70 (12/14/2013)  Weekly max dose   Target end date Indefinite  [facility]  Preferred lab   Send INR reminders to ANTICOAG IMP   Indications  Long-term (current) use of anticoagulants [V58.61] A-fib (Resolved) [427.31]        Comments       Anticoagulation Care Providers   Provider Role Specialty Phone number   Axel Filler, MD  Internal Medicine [phone]      ANTICOAG TODAY: Anticoagulation Summary as of 12/14/2013   INR goal 2.0-3.0  Selected INR 2.70 (12/14/2013)  Next INR check 01/18/2014  Target end date Indefinite   Indications  Long-term (current) use of anticoagulants [V58.61] A-fib (Resolved) [427.31]      Anticoagulation Episode Summary   [facility]   Preferred lab    Send INR reminders to ANTICOAG IMP   Comments     Anticoagulation Care Providers   Provider Role Specialty Phone number   Axel Filler, MD  Internal Medicine [phone]      PATIENT INSTRUCTIONS: Patient Instructions  Patient instructed to take medications as defined in the Anti-coagulation Track section of this encounter.  Patient instructed to take today's dose.  Patient verbalized understanding of these instructions.       FOLLOW-UP Return in 5 weeks (on 01/18/2014) for Follow up INR at 2:30PM.  [name],  III Pharm.D., CACP

## 2013-12-14 NOTE — Patient Instructions (Signed)
Patient instructed to take medications as defined in the Anti-coagulation Track section of this encounter.  Patient instructed to take today's dose.  Patient verbalized understanding of these instructions.    

## 2013-12-17 ENCOUNTER — Encounter: Payer: BC Managed Care – PPO | Admitting: Cardiology

## 2014-01-13 ENCOUNTER — Encounter: Payer: Self-pay | Admitting: Internal Medicine

## 2014-01-13 ENCOUNTER — Ambulatory Visit (INDEPENDENT_AMBULATORY_CARE_PROVIDER_SITE_OTHER): Payer: BC Managed Care – PPO | Admitting: Internal Medicine

## 2014-01-13 VITALS — BP 100/80 | HR 81 | Ht 66.0 in | Wt 182.0 lb

## 2014-01-13 DIAGNOSIS — Z95810 Presence of automatic (implantable) cardiac defibrillator: Secondary | ICD-10-CM

## 2014-01-13 DIAGNOSIS — I421 Obstructive hypertrophic cardiomyopathy: Secondary | ICD-10-CM

## 2014-01-13 LAB — MDC_IDC_ENUM_SESS_TYPE_INCLINIC
Battery Remaining Longevity: 92.4 mo
Brady Statistic RA Percent Paced: 12 %
Brady Statistic RV Percent Paced: 10 %
Date Time Interrogation Session: 20150805122851
HIGH POWER IMPEDANCE MEASURED VALUE: 74.25 Ohm
Lead Channel Impedance Value: 375 Ohm
Lead Channel Sensing Intrinsic Amplitude: 4.6 mV
MDC IDC MSMT LEADCHNL RV IMPEDANCE VALUE: 387.5 Ohm
MDC IDC MSMT LEADCHNL RV PACING THRESHOLD AMPLITUDE: 1 V
MDC IDC MSMT LEADCHNL RV PACING THRESHOLD PULSEWIDTH: 0.5 ms
MDC IDC MSMT LEADCHNL RV SENSING INTR AMPL: 11.9 mV
MDC IDC PG SERIAL: 7170419
MDC IDC SET LEADCHNL RA PACING AMPLITUDE: 2 V
MDC IDC SET LEADCHNL RV PACING AMPLITUDE: 2.5 V
MDC IDC SET LEADCHNL RV PACING PULSEWIDTH: 0.5 ms
MDC IDC SET LEADCHNL RV SENSING SENSITIVITY: 0.5 mV
Zone Setting Detection Interval: 250 ms

## 2014-01-13 LAB — MAGNESIUM: Magnesium: 2 mg/dL (ref 1.5–2.5)

## 2014-01-13 NOTE — Patient Instructions (Signed)
Your physician recommends that you continue on your current medications as directed. Please refer to the Current Medication list given to you today.  Lab today: Magnesium  Remote monitoring is used to monitor your Pacemaker of ICD from home. This monitoring reduces the number of office visits required to check your device to one time per year. It allows [name] to keep an eye on the functioning of your device to ensure it is working properly. You are scheduled for a device check from home on 04/19/14. You may send your transmission at any time that day. If you have a wireless device, the transmission will be sent automatically. After your physician reviews your transmission, you will receive a postcard with your next transmission date.  Your physician wants you to follow-up in: 1 year with Dr. Caryl Comes.  You will receive a reminder letter in the mail two months in advance. If you don't receive a letter, please call our office to schedule the follow-up appointment.

## 2014-01-13 NOTE — Progress Notes (Signed)
Patient Care Team: Axel Filler, MD as PCP - General Deboraha Sprang, MD as Consulting Physician (Cardiology) Alphonsa Gin III, [facility] as Pharmacist (Pharmacist)   HPI  [name] is a 48 y.o. female Seen in followup for hypertrophic cardiomyopathy status post ICD implantation. She's also had atrial fibrillation for which she underwent catheter ablation at [facility] and then by Dr. Omelia Blackwater at [facility] and most recently a convergent procedure at [facility] 10/12. sHe also has a history of nonsustained ventricular tachycardia.  She has persistent atrial arrhythmia and is followed at [facility];   They decided to keep her on tikosyn and she is feeling pretty good  Past Medical History  Diagnosis Date  . Hypertrophic obstructive cardiomyopathy (HOCM)     TEE 09/02/2007 showed moderate to severe left ventricular hypertrophy with an interventricular septal dimension of 1.5 cm and posterior wall dimension of 7 cm; there was normal LV systolic function and estimated EF of approximately 60%; there did not appear to be any obvious outflow tract obstruction.   . Atrial fibrillation     S/P radiofrequency ablation by Dr. Adrian Prows at [facility] on 02/09/2008 and again on 03/31/2009; S/P ablation at [facility] by Dr. Westly Pam on 08/10/2010, followed by recurrent atrial flutter/fibrillation.  Cardiac cath done on 02/19/2011 at [facility] showed no significant coronary disease.  Patient underwent Convergent Procedure at [facility] on 03/07/2011.  . Atrial tachycardia     Status post convergent ablation [facility] October 2012  . Ventricular tachycardia     Hx of nonsustained ventricular tachycardia, S/P hospitalization in March 2009; S/P ICD placement by Dr. Deboraha Sprang on 09/25/2007.  Marland Kitchen Anxiety     Acute stress reaction to multiple ICD shocks  . Seasonal allergic rhinitis   . GERD (gastroesophageal reflux disease)   . Hyperglycemia   . Dizziness   . Anemia   . Cholecystitis, acute 07/2001    S/P  laparoscopic cholecystectomy, intraoperative cholangiogram, and transcystic common bile duct exploration by Dr. Jackolyn Confer on 07/14/2001  . Pelvic mass     Pelvic ultrasound done on 11/30/10 to evaluate abdominal pain showed,  positioned between the ovaries and contiguous with the ovaries, a complex mass demonstrating posterior acoustical enhancement and no definite intralesional flow with color Doppler exam measuring 5.2 x 3.1 x 5.5 cm. The appearance raised the possibility of a ruptured ovarian cyst with subsequent development of a hematoma.   . Fibroids   . 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 Anson Fret to evaluate anemia; pathology showed a tubulovillous adenoma with no high grade dysplasia or malignancy identified.    . Abnormal thyroid blood test     Mildly elevated TSH, resolved.  Marland Kitchen HYPERGLYCEMIA 12/24/2008    Qualifier: Diagnosis of  By: Marinda Elk MD, Sonia Side    . Appendicitis, acute, s/p apendectomy 01/17/2013  . GI bleed 05/25/2011    Past Surgical History  Procedure Laterality Date  . Cardiac defibrillator placement  09/25/2007; 08/2013    Placed by Dr. Deboraha Sprang on 09/25/2007; gen change 08/2013 by Dr Caryl Comes (STJ dual chamber ICD)  . Laparoscopic cholecystectomy  07/14/2001    S/P laparoscopic cholecystectomy, intraoperative cholangiogram, and transcystic common bile duct exploration by Dr. Jackolyn Confer on 07/14/2001  . Convergent ablation  03/07/2011    Patient underwent Convergent Procedure at [facility] on 03/07/2011.  . Atrial ablation surgery  08/10/2010  ablation  done at [facility]  . Atrial ablation surgery  03/2009    ablation at [facility]  . Atrial ablation surgery  01/2008    ablation at [facility]  . Flexible sigmoidoscopy  05/26/2011    Procedure: FLEXIBLE SIGMOIDOSCOPY;  Surgeon: Lear Ng, MD;  Location: [place];  Service: Endoscopy;  Laterality: N/A;  . Laparoscopic appendectomy N/A  01/17/2013    Procedure: APPENDECTOMY LAPAROSCOPIC;  Surgeon: Rolm Bookbinder, MD;  Location: [place];  Service: General;  Laterality: N/A;  . Appendectomy      Current Outpatient Prescriptions  Medication Sig Dispense Refill  . diltiazem (CARDIZEM CD) 180 MG 24 hr capsule Take 180 mg by mouth daily.       Marland Kitchen dofetilide (TIKOSYN) 500 MCG capsule Take 500 mcg by mouth 2 (two) times daily.        . ferrous gluconate (FERGON) 324 MG tablet Take 324 mg by mouth 4 (four) times a week.      . furosemide (LASIX) 20 MG tablet Take 1 tablet (20 mg total) by mouth daily.  90 tablet  3  . metoprolol (TOPROL-XL) 50 MG 24 hr tablet Take 50 mg by mouth 2 (two) times daily.       . Omega-3 Fatty Acids (FISH OIL) 1200 MG CAPS Take 1 capsule (1,200 mg total) by mouth daily.      . sertraline (ZOLOFT) 50 MG tablet Take 50 mg by mouth daily.       . SOY ISOFLAVONE PO Take 80 mg by mouth 2 (two) times daily.      . vitamin B-12 (CYANOCOBALAMIN) 1000 MCG tablet Take 1,000 mcg by mouth daily.      . vitamin E 400 UNIT capsule Take 400 Units by mouth daily.      Marland Kitchen warfarin (COUMADIN) 4 MG tablet Take 1 tablet by mouth daily except on Sundays--take 1&1/2 tablets on Sundays.  32 tablet  2  . [DISCONTINUED] esomeprazole (NEXIUM) 20 MG capsule Take 1 capsule (20 mg total) by mouth daily.  30 capsule  0   No current facility-administered medications for this visit.    No Known Allergies  Review of Systems negative except from HPI and PMH  Physical Exam BP 100/80  Pulse 81  Ht 5\' 6"  (1.676 m)  Wt 182 lb (82.555 kg)  BMI 29.39 kg/m2  LMP 10/06/2011 Well developed and well nourished in no acute distress HENT normal E scleral and icterus clear Neck Supple JVP flat; carotids brisk and full Clear to ausculation Regular rate and rhythm, no murmurs gallops or rub Soft with active bowel sounds No clubbing cyanosis Trace Edema Alert and oriented, grossly normal motor and sensory function Skin Warm and Dry  ECG  demonstrates a QTC of 490 ms.   Assessment and  Plan  HCM  ICD Atrial Fibrillation   She is in atrial fibrillation about 87% of the time. This requesting a value of dofetilide. 3 options come to mind  The first is to leave things as they are., the second is to consider the addition of Ranexa and the third is to think of alternative rhythm control strategies i.e. amiodarone and repeat ablation. She is not interested in the latter. I will discuss with her E. P  at [facility] the former.  She also has 12% of her beats faster than 100 beats per minute. This may be having an impact on her functional status. Unfortunately up titration of rate control is limited because of her low blood pressure.

## 2014-01-18 ENCOUNTER — Ambulatory Visit (INDEPENDENT_AMBULATORY_CARE_PROVIDER_SITE_OTHER): Payer: BC Managed Care – PPO | Admitting: Pharmacist

## 2014-01-18 DIAGNOSIS — Z7901 Long term (current) use of anticoagulants: Secondary | ICD-10-CM

## 2014-01-18 LAB — POCT INR: INR: 4.2

## 2014-01-18 NOTE — Patient Instructions (Signed)
Patient instructed to take medications as defined in the Anti-coagulation Track section of this encounter.  Patient instructed to take today's dose.  Patient verbalized understanding of these instructions.    

## 2014-01-18 NOTE — Progress Notes (Signed)
Anti-Coagulation Progress Note  [name] is a 48 y.o. female who is currently on an anti-coagulation regimen.    RECENT RESULTS: Recent results are below, the most recent result is correlated with a dose of 30 mg. per week: Lab Results  Component Value Date   INR 4.20 01/18/2014   INR 2.70 12/14/2013   INR 3.90 11/16/2013    ANTI-COAG DOSE: Anticoagulation Dose Instructions as of 01/18/2014     [name] Tue Wed Thu Fri Sat   New Dose 4 mg 4 mg 4 mg 4 mg 4 mg 4 mg 4 mg       ANTICOAG SUMMARY: Anticoagulation Episode Summary   Current INR goal 2.0-3.0  Next INR check 02/08/2014  INR from last check 4.20! (01/18/2014)  Weekly max dose   Target end date Indefinite  [facility]  Preferred lab   Send INR reminders to ANTICOAG IMP   Indications  Long-term (current) use of anticoagulants [V58.61] A-fib (Resolved) [427.31]        Comments       Anticoagulation Care Providers   Provider Role Specialty Phone number   Axel Filler, MD  Internal Medicine [phone]      ANTICOAG TODAY: Anticoagulation Summary as of 01/18/2014   INR goal 2.0-3.0  Selected INR 4.20! (01/18/2014)  Next INR check 02/08/2014  Target end date Indefinite   Indications  Long-term (current) use of anticoagulants [V58.61] A-fib (Resolved) [427.31]      Anticoagulation Episode Summary   [facility] check [facility]   Preferred lab    Send INR reminders to ANTICOAG IMP   Comments     Anticoagulation Care Providers   Provider Role Specialty Phone number   Axel Filler, MD  Internal Medicine [phone]      PATIENT INSTRUCTIONS: Patient Instructions  Patient instructed to take medications as defined in the Anti-coagulation Track section of this encounter.  Patient instructed to take today's dose.  Patient verbalized understanding of these instructions.       FOLLOW-UP Return in 3 weeks (on 02/08/2014) for Follow up INR at 2:45pm.  [name],  III Pharm.D., CACP

## 2014-02-08 ENCOUNTER — Ambulatory Visit (INDEPENDENT_AMBULATORY_CARE_PROVIDER_SITE_OTHER): Payer: BC Managed Care – PPO | Admitting: Pharmacist

## 2014-02-08 DIAGNOSIS — Z7901 Long term (current) use of anticoagulants: Secondary | ICD-10-CM | POA: Diagnosis not present

## 2014-02-08 LAB — POCT INR: INR: 3.3

## 2014-02-08 NOTE — Patient Instructions (Signed)
Patient instructed to take medications as defined in the Anti-coagulation Track section of this encounter.  Patient instructed to take today's dose.  Patient verbalized understanding of these instructions.    

## 2014-02-08 NOTE — Progress Notes (Signed)
Anti-Coagulation Progress Note  [name] is a 48 y.o. female who is currently on an anti-coagulation regimen.    RECENT RESULTS: Recent results are below, the most recent result is correlated with a dose of 28 mg. per week: Lab Results  Component Value Date   INR 3.30 02/08/2014   INR 4.20 01/18/2014   INR 2.70 12/14/2013    ANTI-COAG DOSE: Anticoagulation Dose Instructions as of 02/08/2014     [name] Tue Wed Thu Fri Sat   New Dose 4 mg 2 mg 4 mg 4 mg 2 mg 4 mg 4 mg       ANTICOAG SUMMARY: Anticoagulation Episode Summary   Current INR goal 2.0-3.0  Next INR check 02/22/2014  INR from last check 3.30! (02/08/2014)  Weekly max dose   Target end date Indefinite  [facility]  Preferred lab   Send INR reminders to ANTICOAG IMP   Indications  Long-term (current) use of anticoagulants [V58.61] A-fib (Resolved) [427.31]        Comments       Anticoagulation Care Providers   Provider Role Specialty Phone number   Axel Filler, MD  Internal Medicine [phone]      ANTICOAG TODAY: Anticoagulation Summary as of 02/08/2014   INR goal 2.0-3.0  Selected INR 3.30! (02/08/2014)  Next INR check 02/22/2014  Target end date Indefinite   Indications  Long-term (current) use of anticoagulants [V58.61] A-fib (Resolved) [427.31]      Anticoagulation Episode Summary   [facility] check [facility]   Preferred lab    Send INR reminders to ANTICOAG IMP   Comments     Anticoagulation Care Providers   Provider Role Specialty Phone number   Axel Filler, MD  Internal Medicine [phone]      PATIENT INSTRUCTIONS: Patient Instructions  Patient instructed to take medications as defined in the Anti-coagulation Track section of this encounter.  Patient instructed to take today's dose.  Patient verbalized understanding of these instructions.       FOLLOW-UP Return in 2 weeks (on 02/22/2014) for Follow up INR at 4:15PM.  [name],  III Pharm.D., CACP

## 2014-02-10 NOTE — Progress Notes (Signed)
Indication: Atrial fibrillation. Duration: Lifelong. INR: Above target. Agree with Dr. Gladstone Pih assessment and plan.

## 2014-02-18 ENCOUNTER — Telehealth: Payer: Self-pay | Admitting: *Deleted

## 2014-02-18 MED ORDER — MOMETASONE FUROATE 50 MCG/ACT NA SUSP: NASAL | Status: DC

## 2014-02-18 NOTE — Telephone Encounter (Signed)
I tried to call patient but was unable to reach her.  Patient has a history of seasonal allergic rhinitis, and is currently on no medications for that.  If her symptoms are typical of her seasonal allergies, then a steroid nasal spray may be helpful; will send a prescription for Nasonex nasal spray to her pharmacy.  If she develops any concerning symptoms (fever, sinus pain, purulent nasal drainage, nosebleed, or other problems) she should be seen right away.  If her symptoms do not respond to the Nasonex, I would also recommend that she come in for evaluation.

## 2014-02-18 NOTE — Telephone Encounter (Signed)
Patient returned call and I talked with her about her symptoms.  She is having postnasal drip which is provoking a nonproductive cough, and which started after she worked outdoors mowing the grass over the weekend.  She had a similar episode in July which lasted about 3 weeks.  She denies any fever, facial or sinus pain, purulent nasal drainage, bloody nasal drainage, chest pain, shortness of breath, or productive cough.  I advised her to try the Nasonex nasal spray (2 sprays in each nostril once daily), and if her symptoms do not improve by early next week I advised that she be seen in clinic.  I also advised her to come in right away if she develops any of the above concerning symptoms.

## 2014-02-18 NOTE — Telephone Encounter (Signed)
Pt called with c/o drainage to back of throat causing non-productive cough.  This has been going on a few days and pt has a planned trip Sept 19th and hopes to have this cleared up. Pt had the same symptoms in July and it lasted for 3 weeks.  She used Mucinex and Robitussin for treatment. She denies any other Sx, no fever Can you recommend a medication for her? Hx: hypertrophic cardiomyopathy, ICD implantation... ( please see problem list )   Pt # - [phone]

## 2014-02-22 ENCOUNTER — Ambulatory Visit (INDEPENDENT_AMBULATORY_CARE_PROVIDER_SITE_OTHER): Payer: BC Managed Care – PPO | Admitting: Pharmacist

## 2014-02-22 DIAGNOSIS — I4891 Unspecified atrial fibrillation: Secondary | ICD-10-CM | POA: Diagnosis not present

## 2014-02-22 DIAGNOSIS — Z7901 Long term (current) use of anticoagulants: Secondary | ICD-10-CM

## 2014-02-22 LAB — POCT INR: INR: 2.3

## 2014-02-22 NOTE — Patient Instructions (Signed)
Patient instructed to take medications as defined in the Anti-coagulation Track section of this encounter.  Patient instructed to take today's dose.  Patient verbalized understanding of these instructions.    

## 2014-02-22 NOTE — Progress Notes (Signed)
Anti-Coagulation Progress Note  [name] is a 48 y.o. female who is currently on an anti-coagulation regimen.    RECENT RESULTS: Recent results are below, the most recent result is correlated with a dose of 24 mg. per week: Lab Results  Component Value Date   INR 2.30 02/22/2014   INR 3.30 02/08/2014   INR 4.20 01/18/2014    ANTI-COAG DOSE: Anticoagulation Dose Instructions as of 02/22/2014     [name] Tue Wed Thu Fri Sat   New Dose 4 mg 2 mg 4 mg 4 mg 2 mg 4 mg 4 mg       ANTICOAG SUMMARY: Anticoagulation Episode Summary   Current INR goal 2.0-3.0  Next INR check 03/22/2014  INR from last check 2.30 (02/22/2014)  Weekly max dose   Target end date Indefinite  [facility]  Preferred lab   Send INR reminders to ANTICOAG IMP   Indications  Long-term (current) use of anticoagulants [V58.61] A-fib (Resolved) [427.31]        Comments       Anticoagulation Care Providers   Provider Role Specialty Phone number   Axel Filler, MD  Internal Medicine [phone]      ANTICOAG TODAY: Anticoagulation Summary as of 02/22/2014   INR goal 2.0-3.0  Selected INR 2.30 (02/22/2014)  Next INR check 03/22/2014  Target end date Indefinite   Indications  Long-term (current) use of anticoagulants [V58.61] A-fib (Resolved) [427.31]      Anticoagulation Episode Summary   [facility] check [facility]   Preferred lab    Send INR reminders to ANTICOAG IMP   Comments     Anticoagulation Care Providers   Provider Role Specialty Phone number   Axel Filler, MD  Internal Medicine [phone]      PATIENT INSTRUCTIONS: Patient Instructions  Patient instructed to take medications as defined in the Anti-coagulation Track section of this encounter.  Patient instructed to take today's dose.  Patient verbalized understanding of these instructions.       FOLLOW-UP Return in 4 weeks (on 03/22/2014) for Follow up INR at [place],  III Pharm.D., CACP

## 2014-03-01 ENCOUNTER — Ambulatory Visit: Payer: BC Managed Care – PPO | Admitting: Nurse Practitioner

## 2014-03-17 ENCOUNTER — Other Ambulatory Visit: Payer: Self-pay | Admitting: Internal Medicine

## 2014-03-22 ENCOUNTER — Ambulatory Visit (INDEPENDENT_AMBULATORY_CARE_PROVIDER_SITE_OTHER): Payer: BC Managed Care – PPO | Admitting: Pharmacist

## 2014-03-22 DIAGNOSIS — Z7901 Long term (current) use of anticoagulants: Secondary | ICD-10-CM | POA: Diagnosis not present

## 2014-03-22 LAB — POCT INR: INR: 2.1

## 2014-03-23 NOTE — Progress Notes (Signed)
Anti-Coagulation Progress Note  [name] is a 48 y.o. female who reports to the clinic for monitoring of anticoagulation treatment.    RECENT RESULTS: Recent results are below, the most recent result is correlated with a dose of 24 mg. per week: Lab Results  Component Value Date   INR 2.1 03/22/2014   INR 2.30 02/22/2014   INR 3.30 02/08/2014   Weekly dose was unchanged  ANTI-COAG DOSE: INR as of 03/22/2014 and Previous Dosing Information   INR Dt INR Goal [employer] Sun Mon Tue Wed Thu Fri Sat   03/22/2014 2.1 2.0-3.0 24 mg 4 mg 2 mg 4 mg 4 mg 2 mg 4 mg 4 mg    Anticoagulation Dose Instructions as of 03/22/2014     Total Sun Mon Tue Wed Thu Fri Sat   New Dose 24 mg 4 mg 2 mg 4 mg 4 mg 2 mg 4 mg 4 mg     (4 mg x 1)  (4 mg x 0.5)  (4 mg x 1)  (4 mg x 1)  (4 mg x 0.5)  (4 mg x 1)  (4 mg x 1)                           ANTICOAG SUMMARY: Anticoagulation Episode Summary   Current INR goal 2.0-3.0  Next INR check 04/19/2014  INR from last check 2.1 (03/22/2014)  Weekly max dose   Target end date Indefinite  [facility]  Preferred lab   Send INR reminders to ANTICOAG IMP   Indications  Long-term (current) use of anticoagulants [Z79.01] A-fib (Resolved) [I48.91]        Comments       Anticoagulation Care Providers   Provider Role Specialty Phone number   Axel Filler, MD  Internal Medicine [phone]     PATIENT INSTRUCTIONS: Patient Instructions  Patient instructed to take medications as defined in the Anti-coagulation Track section of this encounter.  Patient instructed to take today's dose.  Patient verbalized understanding of these instructions.    FOLLOW-UP 4 weeks  Patient was seen in clinic by Elicia Lamp, PharmD, PGY1 pharmacy resident. I agree with the assessment, diagnosis, and plan of care documented by the pharmacy resident.   Flossie Dibble, PharmD BCPS, BCACP

## 2014-03-23 NOTE — Patient Instructions (Signed)
Patient instructed to take medications as defined in the Anti-coagulation Track section of this encounter.  Patient instructed to take today's dose.  Patient verbalized understanding of these instructions.    

## 2014-03-25 NOTE — Progress Notes (Signed)
Patient on coumadin for Afib.  INR is 2.1.  I have reviewed Dr. Julianne Rice note.

## 2014-04-08 ENCOUNTER — Ambulatory Visit: Payer: BC Managed Care – PPO | Admitting: Nurse Practitioner

## 2014-04-19 ENCOUNTER — Ambulatory Visit: Payer: BC Managed Care – PPO

## 2014-04-19 ENCOUNTER — Encounter: Payer: BC Managed Care – PPO | Admitting: *Deleted

## 2014-04-19 ENCOUNTER — Telehealth: Payer: Self-pay | Admitting: Cardiology

## 2014-04-19 NOTE — Telephone Encounter (Signed)
Spoke with pt and reminded pt of remote transmission that is due today. Pt verbalized understanding.   

## 2014-04-20 ENCOUNTER — Encounter: Payer: Self-pay | Admitting: Cardiology

## 2014-05-03 ENCOUNTER — Encounter: Payer: Self-pay | Admitting: Nurse Practitioner

## 2014-05-03 ENCOUNTER — Ambulatory Visit (INDEPENDENT_AMBULATORY_CARE_PROVIDER_SITE_OTHER): Payer: BC Managed Care – PPO | Admitting: Nurse Practitioner

## 2014-05-03 ENCOUNTER — Ambulatory Visit (INDEPENDENT_AMBULATORY_CARE_PROVIDER_SITE_OTHER): Payer: BC Managed Care – PPO | Admitting: Pharmacist

## 2014-05-03 VITALS — BP 104/82 | HR 68 | Ht 65.5 in | Wt 184.0 lb

## 2014-05-03 DIAGNOSIS — Z7901 Long term (current) use of anticoagulants: Secondary | ICD-10-CM | POA: Diagnosis not present

## 2014-05-03 DIAGNOSIS — I4891 Unspecified atrial fibrillation: Secondary | ICD-10-CM | POA: Diagnosis not present

## 2014-05-03 DIAGNOSIS — Z01419 Encounter for gynecological examination (general) (routine) without abnormal findings: Secondary | ICD-10-CM

## 2014-05-03 DIAGNOSIS — Z0000 Encounter for general adult medical examination without abnormal findings: Secondary | ICD-10-CM

## 2014-05-03 LAB — POCT INR: INR: 1.9

## 2014-05-03 LAB — POCT URINALYSIS DIPSTICK
Bilirubin, UA: NEGATIVE
Glucose, UA: NEGATIVE
Ketones, UA: NEGATIVE
Leukocytes, UA: NEGATIVE
NITRITE UA: NEGATIVE
PH UA: 5
Protein, UA: NEGATIVE
Urobilinogen, UA: NEGATIVE

## 2014-05-03 NOTE — Progress Notes (Signed)
Anti-Coagulation Progress Note  [name] is a 48 y.o. female who is currently on an anti-coagulation regimen.    RECENT RESULTS: Recent results are below, the most recent result is correlated with a dose of 24 mg. per week: Lab Results  Component Value Date   INR 1.90 05/03/2014   INR 2.1 03/22/2014   INR 2.30 02/22/2014    ANTI-COAG DOSE: Anticoagulation Dose Instructions as of 05/03/2014      [name] Tue Wed Thu Fri Sat   New Dose 4 mg 2 mg 4 mg 4 mg 2 mg 4 mg 4 mg    Description        Will take 1/2x4mg  (2mg ) additional tablets of warfarin TODAY only x 1 dose.        ANTICOAG SUMMARY: Anticoagulation Episode Summary    Current INR goal 2.0-3.0  Next INR check 06/07/2014  INR from last check 1.90! (05/03/2014)  Weekly max dose   Target end date Indefinite  [facility]  Preferred lab   Send INR reminders to ANTICOAG IMP   Indications  Long-term (current) use of anticoagulants [Z79.01] A-fib (Resolved) [I48.91]        Comments       Anticoagulation Care Providers    Provider Role Specialty Phone number   Axel Filler, MD  Internal Medicine [phone]      ANTICOAG TODAY: Anticoagulation Summary as of 05/03/2014    INR goal 2.0-3.0  Selected INR 1.90! (05/03/2014)  Next INR check 06/07/2014  Target end date Indefinite   Indications  Long-term (current) use of anticoagulants [Z79.01] A-fib (Resolved) [I48.91]      Anticoagulation Episode Summary    [facility]   Preferred lab    Send INR reminders to ANTICOAG IMP   Comments     Anticoagulation Care Providers    Provider Role Specialty Phone number   Axel Filler, MD  Internal Medicine [phone]      PATIENT INSTRUCTIONS: Patient Instructions  Patient instructed to take medications as defined in the Anti-coagulation Track section of this encounter.  Patient instructed to take today's dose. Take 1/2 x 4mg  (2mg ) ADDITIONAL ONE TIME  dose of warfarin today only.  Patient instructed to DISCONTINUE WARFARIN based upon instructions provided by her [facility] cardiologist performing ablation. Resume based upon their instructions--otherwise, resume based upon instructions I have provided.  Patient verbalized understanding of these instructions.       FOLLOW-UP Return in 5 weeks (on 06/07/2014) for Follow up INR at [place], III Pharm.D., CACP

## 2014-05-03 NOTE — Progress Notes (Signed)
48 y.o. G0P0 Single Caucasian Fe here for annual exam.  Surgery 05/24/14 with ablation of heart at [facility].  Recent battery changed.  Some hot flashes in the afternoon that are tolerable.  Same female partner for 6 years.  Patient's last menstrual period was 10/06/2011.          Sexually active: Yes.    The current method of family planning is post menopausal status.    Exercising: No.  The patient does not participate in regular exercise at present. Smoker:  no  Health Maintenance: Pap:  02/23/13 normal with neg HR HPV MMG:  04/30/14 thru [facility] Colonoscopy:  04/22/11, tubular adenoma repeat in 5 years TDaP:  12/17/2006 Labs: PCP/Cardio                UA: RBC-Trace, ph: 5.0   reports that she has never smoked. She has never used smokeless tobacco. She reports that she does not drink alcohol or use illicit drugs.  Past Medical History  Diagnosis Date  . Hypertrophic obstructive cardiomyopathy (HOCM)     TEE 09/02/2007 showed moderate to severe left ventricular hypertrophy with an interventricular septal dimension of 1.5 cm and posterior wall dimension of 7 cm; there was normal LV systolic function and estimated EF of approximately 60%; there did not appear to be any obvious outflow tract obstruction.   . Atrial fibrillation     S/P radiofrequency ablation by Dr. Adrian Prows at [facility] on 02/09/2008 and again on 03/31/2009; S/P ablation at [facility] by Dr. Westly Pam on 08/10/2010, followed by recurrent atrial flutter/fibrillation.  Cardiac cath done on 02/19/2011 at [facility] showed no significant coronary disease.  Patient underwent Convergent Procedure at [facility] on 03/07/2011.  . Atrial tachycardia     Status post convergent ablation [facility] October 2012  . Ventricular tachycardia     Hx of nonsustained ventricular tachycardia, S/P hospitalization in March 2009; S/P ICD placement by Dr. Deboraha Sprang on 09/25/2007.  Marland Kitchen Anxiety     Acute stress reaction to multiple ICD shocks  .  Seasonal allergic rhinitis   . GERD (gastroesophageal reflux disease)   . Hyperglycemia   . Dizziness   . Anemia   . Cholecystitis, acute 07/2001    S/P laparoscopic cholecystectomy, intraoperative cholangiogram, and transcystic common bile duct exploration by Dr. Jackolyn Confer on 07/14/2001  . Pelvic mass     Pelvic ultrasound done on 11/30/10 to evaluate abdominal pain showed,  positioned between the ovaries and contiguous with the ovaries, a complex mass demonstrating posterior acoustical enhancement and no definite intralesional flow with color Doppler exam measuring 5.2 x 3.1 x 5.5 cm. The appearance raised the possibility of a ruptured ovarian cyst with subsequent development of a hematoma.   . Fibroids   . 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 Anson Fret to evaluate anemia; pathology showed a tubulovillous adenoma with no high grade dysplasia or malignancy identified.    . Abnormal thyroid blood test     Mildly elevated TSH, resolved.  Marland Kitchen HYPERGLYCEMIA 12/24/2008    Qualifier: Diagnosis of  By: Marinda Elk MD, Sonia Side    . Appendicitis, acute, s/p apendectomy 01/17/2013  . GI bleed 05/25/2011    Past Surgical History  Procedure Laterality Date  . Cardiac defibrillator placement  09/25/2007; 08/2013    Placed by Dr. Deboraha Sprang on 09/25/2007; gen change 08/2013 by Dr Caryl Comes (STJ dual chamber ICD)  .  Laparoscopic cholecystectomy  07/14/2001    S/P laparoscopic cholecystectomy, intraoperative cholangiogram, and transcystic common bile duct exploration by Dr. Jackolyn Confer on 07/14/2001  . Convergent ablation  03/07/2011    Patient underwent Convergent Procedure at [facility] on 03/07/2011.  . Atrial ablation surgery  08/10/2010    ablation  done at [facility]  . Atrial ablation surgery  03/2009    ablation at [facility]  . Atrial ablation surgery  01/2008    ablation at [facility]  . Flexible sigmoidoscopy  05/26/2011    Procedure:  FLEXIBLE SIGMOIDOSCOPY;  Surgeon: Lear Ng, MD;  Location: [place];  Service: Endoscopy;  Laterality: N/A;  . Laparoscopic appendectomy N/A 01/17/2013    Procedure: APPENDECTOMY LAPAROSCOPIC;  Surgeon: Rolm Bookbinder, MD;  Location: [place];  Service: General;  Laterality: N/A;  . Appendectomy      Current Outpatient Prescriptions  Medication Sig Dispense Refill  . diltiazem (CARDIZEM CD) 180 MG 24 hr capsule Take 180 mg by mouth daily.     Marland Kitchen dofetilide (TIKOSYN) 500 MCG capsule Take 500 mcg by mouth 2 (two) times daily.      . furosemide (LASIX) 20 MG tablet Take 1 tablet (20 mg total) by mouth daily. 90 tablet 3  . metoprolol (TOPROL-XL) 50 MG 24 hr tablet Take 50 mg by mouth 2 (two) times daily.     . mometasone (NASONEX) 50 MCG/ACT nasal spray Place 2 sprays (100 mcg) into each nostril once daily 17 g 3  . Omega-3 Fatty Acids (FISH OIL) 1200 MG CAPS Take 1 capsule (1,200 mg total) by mouth daily.    . sertraline (ZOLOFT) 50 MG tablet Take 50 mg by mouth daily.     . SOY ISOFLAVONE PO Take 80 mg by mouth 2 (two) times daily.    . vitamin B-12 (CYANOCOBALAMIN) 1000 MCG tablet Take 1,000 mcg by mouth daily.    . vitamin E 400 UNIT capsule Take 400 Units by mouth daily.    Marland Kitchen warfarin (COUMADIN) 4 MG tablet Take as directed by the anticoagulation clinic provider. 30 tablet 2  . [DISCONTINUED] esomeprazole (NEXIUM) 20 MG capsule Take 1 capsule (20 mg total) by mouth daily. 30 capsule 0   No current facility-administered medications for this visit.    Family History  Problem Relation Age of Onset  . Hypertrophic cardiomyopathy Sister   . Heart disease Sister   . Hypertension Sister   . Hypertrophic cardiomyopathy Cousin     3 cousins on mother's side Deceased  . Throat cancer Father   . Coronary artery disease Father 2    S/P CABG  . Heart disease Father   . Cancer Father     throat  . Breast cancer Paternal Aunt   . Colon polyps Mother   . Hypertrophic  cardiomyopathy Mother   . Heart disease Mother   . Hyperlipidemia Mother   . Ovarian cancer Neg Hx     great aunt had  . Colon cancer Neg Hx   . Lung cancer Neg Hx   . Diabetes Neg Hx   . Hypertrophic cardiomyopathy Maternal Grandmother   . Hypertrophic cardiomyopathy      great uncle  . Cancer Maternal Aunt     breast  . Heart disease Sister     ROS:  Pertinent items are noted in HPI.  Otherwise, a comprehensive ROS was negative.  Exam:   BP 104/82 mmHg  Pulse 68  Ht 5' 5.5" (1.664 m)  Wt 184 lb (83.462 kg)  BMI 30.14 kg/m2  LMP 10/06/2011 Height: 5' 5.5" (166.4 cm)  Ht Readings from Last 3 Encounters:  05/03/14 5' 5.5" (1.664 m)  01/13/14 5\' 6"  (1.676 m)  08/26/13 5\' 5"  (1.651 m)    General appearance: alert, cooperative and appears stated age Head: Normocephalic, without obvious abnormality, atraumatic Neck: no adenopathy, supple, symmetrical, trachea midline and thyroid normal to inspection and palpation Lungs: clear to auscultation bilaterally Breasts: normal appearance, no masses or tenderness Heart: regular rate and rhythm, rare PAC Abdomen: soft, non-tender; no masses,  no organomegaly Extremities: extremities normal, atraumatic, no cyanosis or edema Skin: Skin color, texture, turgor normal. No rashes or lesions Lymph nodes: Cervical, supraclavicular, and axillary nodes normal. No abnormal inguinal nodes palpated Neurologic: Grossly normal   Pelvic: External genitalia:  no lesions              Urethra:  normal appearing urethra with no masses, tenderness or lesions              Bartholin's and Skene's: normal                 Vagina: normal appearing vagina with normal color and discharge, no lesions              Cervix: anteverted              Pap taken: No. Bimanual Exam:  Uterus:  normal size, contour, position, consistency, mobility, non-tender              Adnexa: no mass, fullness, tenderness               Rectovaginal: Confirms               Anus:   normal sphincter tone, no lesions  A:  Well Woman with normal exam  Postmenopausal no HRT ever History of IHHS with coronary ablation and ICD implantation 09/25/07  S/P coronary ablation with Thoracotomy 02/2011  For repeat coronary ablation 12/15 S/P Lap Appendectomy 01/17/13  history of Ovarian complex cyst or hematoma with resolution 9/14  P:   Reviewed health and wellness pertinent to exam  Pap smear not taken today  Mammogram is due 11/16  Counseled on breast self exam, mammography screening, adequate intake of calcium and vitamin D, diet and exercise return annually or prn  An After Visit Summary was printed and given to the patient.

## 2014-05-03 NOTE — Patient Instructions (Signed)
Patient instructed to take medications as defined in the Anti-coagulation Track section of this encounter.  Patient instructed to take today's dose. Take 1/2 x 4mg  (2mg ) ADDITIONAL ONE TIME dose of warfarin today only.  Patient instructed to DISCONTINUE WARFARIN based upon instructions provided by her [facility] cardiologist performing ablation. Resume based upon their instructions--otherwise, resume based upon instructions I have provided.  Patient verbalized understanding of these instructions.

## 2014-05-05 NOTE — Progress Notes (Signed)
Reviewed personally.  M. Suzanne Chyna Kneece, MD.  

## 2014-05-20 ENCOUNTER — Encounter (HOSPITAL_COMMUNITY): Payer: Self-pay | Admitting: Internal Medicine

## 2014-05-24 HISTORY — PX: RADIOFREQUENCY ABLATION: SHX2290

## 2014-06-07 ENCOUNTER — Ambulatory Visit: Payer: BC Managed Care – PPO

## 2014-08-18 ENCOUNTER — Telehealth: Payer: Self-pay | Admitting: Internal Medicine

## 2014-08-18 NOTE — Telephone Encounter (Signed)
Call to patient to confirm appointment for 08/19/14 at 8:15. lmtcb

## 2014-08-19 ENCOUNTER — Ambulatory Visit (INDEPENDENT_AMBULATORY_CARE_PROVIDER_SITE_OTHER): Payer: BC Managed Care – PPO | Admitting: Internal Medicine

## 2014-08-19 ENCOUNTER — Encounter: Payer: Self-pay | Admitting: Internal Medicine

## 2014-08-19 VITALS — BP 111/67 | HR 69 | Temp 98.3°F | Wt 186.3 lb

## 2014-08-19 DIAGNOSIS — R739 Hyperglycemia, unspecified: Secondary | ICD-10-CM

## 2014-08-19 DIAGNOSIS — D509 Iron deficiency anemia, unspecified: Secondary | ICD-10-CM

## 2014-08-19 DIAGNOSIS — I421 Obstructive hypertrophic cardiomyopathy: Secondary | ICD-10-CM

## 2014-08-19 DIAGNOSIS — K219 Gastro-esophageal reflux disease without esophagitis: Secondary | ICD-10-CM

## 2014-08-19 DIAGNOSIS — Z7901 Long term (current) use of anticoagulants: Secondary | ICD-10-CM

## 2014-08-19 DIAGNOSIS — F419 Anxiety disorder, unspecified: Secondary | ICD-10-CM

## 2014-08-19 DIAGNOSIS — I5032 Chronic diastolic (congestive) heart failure: Secondary | ICD-10-CM

## 2014-08-19 DIAGNOSIS — I4719 Other supraventricular tachycardia: Secondary | ICD-10-CM

## 2014-08-19 DIAGNOSIS — Z114 Encounter for screening for human immunodeficiency virus [HIV]: Secondary | ICD-10-CM

## 2014-08-19 DIAGNOSIS — R1900 Intra-abdominal and pelvic swelling, mass and lump, unspecified site: Secondary | ICD-10-CM

## 2014-08-19 DIAGNOSIS — Z1159 Encounter for screening for other viral diseases: Secondary | ICD-10-CM

## 2014-08-19 DIAGNOSIS — I471 Supraventricular tachycardia: Secondary | ICD-10-CM

## 2014-08-19 LAB — CBC WITH DIFFERENTIAL/PLATELET
BASOS PCT: 1 % (ref 0–1)
Basophils Absolute: 0.1 10*3/uL (ref 0.0–0.1)
EOS ABS: 0.1 10*3/uL (ref 0.0–0.7)
Eosinophils Relative: 2 % (ref 0–5)
HCT: 39.2 % (ref 36.0–46.0)
HEMOGLOBIN: 12.5 g/dL (ref 12.0–15.0)
LYMPHS ABS: 1.1 10*3/uL (ref 0.7–4.0)
LYMPHS PCT: 19 % (ref 12–46)
MCH: 28.5 pg (ref 26.0–34.0)
MCHC: 31.9 g/dL (ref 30.0–36.0)
MCV: 89.3 fL (ref 78.0–100.0)
MPV: 10.1 fL (ref 8.6–12.4)
Monocytes Absolute: 0.5 10*3/uL (ref 0.1–1.0)
Monocytes Relative: 8 % (ref 3–12)
Neutro Abs: 4.1 10*3/uL (ref 1.7–7.7)
Neutrophils Relative %: 70 % (ref 43–77)
PLATELETS: 289 10*3/uL (ref 150–400)
RBC: 4.39 MIL/uL (ref 3.87–5.11)
RDW: 14.3 % (ref 11.5–15.5)
WBC: 5.9 10*3/uL (ref 4.0–10.5)

## 2014-08-19 LAB — COMPLETE METABOLIC PANEL WITH GFR
ALK PHOS: 86 U/L (ref 39–117)
ALT: 13 U/L (ref 0–35)
AST: 17 U/L (ref 0–37)
Albumin: 4.3 g/dL (ref 3.5–5.2)
BUN: 20 mg/dL (ref 6–23)
CO2: 27 mEq/L (ref 19–32)
Calcium: 9.2 mg/dL (ref 8.4–10.5)
Chloride: 107 mEq/L (ref 96–112)
Creat: 0.86 mg/dL (ref 0.50–1.10)
GFR, Est African American: >89 mL/min
GFR, Est Non African American: 79 mL/min
Glucose, Bld: 89 mg/dL (ref 70–99)
POTASSIUM: 4.4 meq/L (ref 3.5–5.3)
Sodium: 144 mEq/L (ref 135–145)
TOTAL PROTEIN: 6.2 g/dL (ref 6.0–8.3)
Total Bilirubin: 0.6 mg/dL (ref 0.2–1.2)

## 2014-08-19 LAB — GLUCOSE, CAPILLARY: Glucose-Capillary: 93 mg/dL (ref 70–99)

## 2014-08-19 LAB — FERRITIN: Ferritin: 66 ng/mL (ref 10–291)

## 2014-08-19 LAB — POCT GLYCOSYLATED HEMOGLOBIN (HGB A1C): Hemoglobin A1C: 6

## 2014-08-19 NOTE — Patient Instructions (Signed)
Continue current medications. 

## 2014-08-19 NOTE — Assessment & Plan Note (Signed)
Lab Results  Component Value Date   HGBA1C 6.0 08/19/2014   HGBA1C 5.7 08/26/2013   HGBA1C 5.6 12/22/2008    Lab Results  Component Value Date   GLUCAP 93 08/19/2014    Lab Results  Component Value Date   GLUCOSE 98 08/28/2013   GLUCOSE 126* 08/10/2013   GLUCOSE 88 05/29/2013     Assessment: Patient has had occasional elevated blood sugars.  Her glucose and hemoglobin A1c are normal.  Plan: I discussed the benefits of lifestyle management with patient, and I advised that her blood sugar and A1c be periodically checked.

## 2014-08-19 NOTE — Assessment & Plan Note (Addendum)
Lab Results  Component Value Date   HGB 12.9 08/10/2013   HGB 13.0 01/18/2013   HGB 12.3 01/17/2013    Lab Results  Component Value Date   FERRITIN 80 09/17/2012     Assessment: Patient has a history of iron deficiency anemia.  Recent hemoglobins have been normal.    Plan: Check a CBC with differential and ferritin today.

## 2014-08-19 NOTE — Assessment & Plan Note (Signed)
Assessment: Patient is followed by Dr. Virl Axe here at [facility] and by Dr. Stark Klein who is her electrophysiologist at [facility].  Because of persistent atrial tachycardia, she underwent radiofrequency catheter ablation by Dr. Willis Modena at [facility] on 05/24/2014, and reports that she has done well since that procedure.  Her chronic anticoagulation was changed from warfarin to Eliquis by her cardiologist, and she has done well on the Eliquis.  Regarding her antiarrhythmic regimen, she is now off of diltiazem; current cardiac medications include dofetilide (Tikosyn) 500 g twice a day, metoprolol XL 50 mg each a.m. and 100 mg each p.m., furosemide 20 mg daily, and apixaban (Eliquis) 5 mg twice a day.  Plan: Patient will continue current medications which are managed by her cardiologist.

## 2014-08-19 NOTE — Progress Notes (Signed)
   Subjective:    Patient ID: [name], female    DOB: February 20, 1966, 49 y.o.   MRN: [id number]  HPI Patient returns for follow-up of her hypertrophic obstructive cardiomyopathy, atrial fibrillation/atrial tachycardia, anxiety, GERD, hyperglycemia, and other chronic medical problems.   Because of persistent atrial tachycardia, she underwent successful catheter ablation at [facility] by Dr. Willis Modena on 05/24/2014.  She has done well following that procedure and is followed by Dr. Willis Modena on a regular basis.  She has had no recent palpitations, and feels less tired and out of breath them prior to the procedure.  She has no acute complaints today.  She reports that her anxiety is well controlled on sertraline, which was started by her former psychiatrist Dr. Caprice Beaver; she tried tapering the sertraline in 2014 but had recurrence of symptoms so resumed the sertraline and has done well since.  She is followed now at [facility].  She has occasional dyspepsia for which she takes ranitidine with good relief.   Review of Systems  Constitutional: Negative for fever, chills and diaphoresis.  Respiratory: Negative for cough, shortness of breath and wheezing.   Cardiovascular: Negative for chest pain, palpitations and leg swelling.  Gastrointestinal: Negative for nausea, vomiting, abdominal pain, blood in stool and anal bleeding.  Genitourinary: Negative for dysuria, frequency and difficulty urinating.  Musculoskeletal: Negative for arthralgias.  Neurological: Negative for dizziness, syncope, weakness and numbness.  Psychiatric/Behavioral: Negative for suicidal ideas and dysphoric mood.    I reviewed and updated the medication list, allergies, past medical history, past surgical history, family history, and social history.      Objective:   Physical Exam  Constitutional: No distress.  Cardiovascular: Normal rate, regular rhythm and normal heart sounds.  Exam reveals no gallop and no friction rub.    No murmur heard. No lower extremity edema.  Pulmonary/Chest: Effort normal and breath sounds normal. No respiratory distress. She has no wheezes. She has no rales.  Abdominal: Soft. Bowel sounds are normal. She exhibits no distension. There is no hepatosplenomegaly. There is no tenderness. There is no rebound and no guarding.       Assessment & Plan:

## 2014-08-19 NOTE — Assessment & Plan Note (Signed)
Assessment: Patient is doing well on furosemide 20 mg daily in addition to her other cardiac medications.  Plan: Continue current regimen.

## 2014-08-19 NOTE — Assessment & Plan Note (Signed)
Assessment: Patient has occasional dyspepsia relieved by over-the-counter ranitidine.  Plan: Continue current management.

## 2014-08-19 NOTE — Assessment & Plan Note (Addendum)
Assessment: Patient is doing well now on apixaban (Eliquis) 5 mg twice a day.  Plan: Continue apixaban (Eliquis) 5 mg twice a day.

## 2014-08-19 NOTE — Assessment & Plan Note (Signed)
Assessment: Patient is doing well on sertraline 50 mg daily.  She is followed at [facility].  She reports that she tried tapering off of the sertraline in 2014 but had recurrence of symptoms, so her psychiatrist advised continuing sertraline.  Plan: Continue sertraline; follow-up at [facility].

## 2014-08-19 NOTE — Assessment & Plan Note (Signed)
Assessment: Patient is followed by Dr. Virl Axe here at [facility] and by Dr. Stark Klein who is her electrophysiologist at [facility].  Because of persistent atrial tachycardia, she underwent radiofrequency catheter ablation by Dr. Willis Modena at [facility] on 05/24/2014, and reports that she has done well since that procedure.  Her chronic anticoagulation was changed from warfarin to Eliquis by her cardiologist, and she has done well on the Eliquis.  Regarding her antiarrhythmic regimen, she is now off of diltiazem; current cardiac medications include dofetilide (Tikosyn) 500 g twice a day, metoprolol XL 50 mg each a.m. and 100 mg each p.m., furosemide 20 mg daily, and apixaban (Eliquis) 5 mg twice a day.  Plan: Patient will continue current medications which are managed by her cardiologist.

## 2014-08-20 LAB — HEPATITIS C ANTIBODY: HCV AB: NEGATIVE

## 2014-08-20 LAB — HIV ANTIBODY (ROUTINE TESTING W REFLEX): HIV: NONREACTIVE

## 2014-08-25 ENCOUNTER — Encounter: Payer: Self-pay | Admitting: *Deleted

## 2014-11-18 ENCOUNTER — Encounter: Payer: BC Managed Care – PPO | Admitting: Internal Medicine

## 2014-12-08 ENCOUNTER — Telehealth: Payer: Self-pay | Admitting: Internal Medicine

## 2014-12-08 NOTE — Telephone Encounter (Signed)
Call to patient to confirm appointment for 12/09/14 at 8:15 lmtcb

## 2014-12-09 ENCOUNTER — Ambulatory Visit (INDEPENDENT_AMBULATORY_CARE_PROVIDER_SITE_OTHER): Payer: BC Managed Care – PPO | Admitting: Internal Medicine

## 2014-12-09 ENCOUNTER — Encounter: Payer: Self-pay | Admitting: Internal Medicine

## 2014-12-09 VITALS — BP 98/67 | HR 70 | Temp 98.2°F | Ht 66.0 in | Wt 184.9 lb

## 2014-12-09 DIAGNOSIS — R739 Hyperglycemia, unspecified: Secondary | ICD-10-CM

## 2014-12-09 DIAGNOSIS — Z7901 Long term (current) use of anticoagulants: Secondary | ICD-10-CM

## 2014-12-09 DIAGNOSIS — F419 Anxiety disorder, unspecified: Secondary | ICD-10-CM

## 2014-12-09 DIAGNOSIS — D509 Iron deficiency anemia, unspecified: Secondary | ICD-10-CM

## 2014-12-09 DIAGNOSIS — Z95810 Presence of automatic (implantable) cardiac defibrillator: Secondary | ICD-10-CM

## 2014-12-09 DIAGNOSIS — E669 Obesity, unspecified: Secondary | ICD-10-CM | POA: Insufficient documentation

## 2014-12-09 DIAGNOSIS — K219 Gastro-esophageal reflux disease without esophagitis: Secondary | ICD-10-CM

## 2014-12-09 DIAGNOSIS — I5042 Chronic combined systolic (congestive) and diastolic (congestive) heart failure: Secondary | ICD-10-CM

## 2014-12-09 DIAGNOSIS — I421 Obstructive hypertrophic cardiomyopathy: Secondary | ICD-10-CM

## 2014-12-09 DIAGNOSIS — J301 Allergic rhinitis due to pollen: Secondary | ICD-10-CM

## 2014-12-09 DIAGNOSIS — E66811 Obesity, class 1: Secondary | ICD-10-CM | POA: Insufficient documentation

## 2014-12-09 DIAGNOSIS — J302 Other seasonal allergic rhinitis: Secondary | ICD-10-CM

## 2014-12-09 DIAGNOSIS — I4729 Other ventricular tachycardia: Secondary | ICD-10-CM

## 2014-12-09 DIAGNOSIS — I472 Ventricular tachycardia: Secondary | ICD-10-CM

## 2014-12-09 LAB — BASIC METABOLIC PANEL WITH GFR
BUN: 17 mg/dL (ref 6–23)
CO2: 26 mEq/L (ref 19–32)
Calcium: 9.2 mg/dL (ref 8.4–10.5)
Chloride: 108 mEq/L (ref 96–112)
Creat: 0.85 mg/dL (ref 0.50–1.10)
GFR, Est Non African American: 80 mL/min
Glucose, Bld: 85 mg/dL (ref 70–99)
Potassium: 4.5 mEq/L (ref 3.5–5.3)
SODIUM: 145 meq/L (ref 135–145)

## 2014-12-09 LAB — MAGNESIUM: Magnesium: 2.2 mg/dL (ref 1.5–2.5)

## 2014-12-09 NOTE — Assessment & Plan Note (Signed)
OTC nasal steroid PRN when flares in spring. Added to med list.

## 2014-12-09 NOTE — Assessment & Plan Note (Signed)
Uses Ranitidine PRN.

## 2014-12-09 NOTE — Assessment & Plan Note (Signed)
Her ICD is managed by Dr Caryl Comes. Otherwise, mgmt Dr Letta Median at [facility]. He Rx's Tikosyn, metoprolol XL, Eliquis, and lasix. Sees him yearly. Also has tachycardias. Most recent check - no A Fib. Had a 6 min run of atrial tachy in Feb since then, only short bursts. Occ symptomatic but less so now bc rate stays about 140 instead of > 200 as in past.

## 2014-12-09 NOTE — Progress Notes (Signed)
   Subjective:    Patient ID: [name], female    DOB: December 13, 1965, 49 y.o.   MRN: [id number]  HPI  Shadaya L Nolet is here for HOCM F/U. Please see the A&P for the status of the pt's chronic medical problems.   Review of Systems  Constitutional: Negative for unexpected weight change.  HENT: Negative for rhinorrhea and sneezing.   Eyes: Negative for itching.  Respiratory: Positive for shortness of breath.   Cardiovascular: Negative for chest pain and leg swelling.  Gastrointestinal: Negative for nausea, vomiting, diarrhea and abdominal distention.  Allergic/Immunologic: Positive for environmental allergies.  Neurological: Negative for dizziness and light-headedness.  Psychiatric/Behavioral: Negative for sleep disturbance and dysphoric mood.       Objective:   Physical Exam  Constitutional: She appears well-developed and well-nourished. No distress.  HENT:  Head: Normocephalic and atraumatic.  Right Ear: External ear normal.  Left Ear: External ear normal.  Nose: Nose normal.  Eyes: Conjunctivae and EOM are normal.  Cardiovascular: Normal rate, regular rhythm and normal heart sounds.   Pulmonary/Chest: Effort normal and breath sounds normal.  Musculoskeletal: She exhibits no edema.  Neurological: She is alert.  Skin: Skin is warm and dry. She is not diaphoretic.  Psychiatric: She has a normal mood and affect. Her behavior is normal. Judgment and thought content normal.          Assessment & Plan:

## 2014-12-09 NOTE — Assessment & Plan Note (Signed)
This was most likely related to post colon with polyp removal bleeding (on warfarin and lovenox). Now off iron and in March, HgB was 12.5 and ferritin 66 (down from 80). Check labs occasionally. No more bleeding.

## 2014-12-09 NOTE — Assessment & Plan Note (Signed)
Dx based on A1C 5.6 - 5.7 - 6.0 March 2016. No meds. No dx of DM. Fam h/o DM on her father's side. Will check about yearly.

## 2014-12-09 NOTE — Patient Instructions (Addendum)
Please see me in 6 months.

## 2014-12-09 NOTE — Assessment & Plan Note (Signed)
This was from 30 ICD shocks in 26 min yrs ago. Tried to stop med yrs ago and didn't do well so on Sertraline 50 QD. Rx by [facility].

## 2014-12-09 NOTE — Assessment & Plan Note (Signed)
No hospitalizations. Gets ABD fullness. On lasix Rx by [facility]. Occasionally gets ABD fullness and takes an extra half dose of lasix. Never has had Pul edema and rarely LE edema.

## 2014-12-10 NOTE — Progress Notes (Signed)
Results of BMP and Magnesium faxed to Dr. Loni Muse. Willis Modena at [facility], fax number [phone].  V.Rushing 12/10/14 10:30am

## 2015-01-28 ENCOUNTER — Encounter: Payer: Self-pay | Admitting: Internal Medicine

## 2015-01-28 ENCOUNTER — Ambulatory Visit (INDEPENDENT_AMBULATORY_CARE_PROVIDER_SITE_OTHER): Payer: BC Managed Care – PPO | Admitting: *Deleted

## 2015-01-28 DIAGNOSIS — I472 Ventricular tachycardia: Secondary | ICD-10-CM | POA: Diagnosis not present

## 2015-01-28 DIAGNOSIS — I4729 Other ventricular tachycardia: Secondary | ICD-10-CM

## 2015-02-01 NOTE — Progress Notes (Signed)
Remote ICD transmission.   

## 2015-02-04 ENCOUNTER — Encounter: Payer: Self-pay | Admitting: *Deleted

## 2015-02-04 LAB — CUP PACEART REMOTE DEVICE CHECK
Battery Remaining Longevity: 65 mo
Battery Remaining Percentage: 83 %
Brady Statistic AP VP Percent: 23 %
Brady Statistic AS VP Percent: 1 %
Brady Statistic AS VS Percent: 3.8 %
Brady Statistic RA Percent Paced: 94 %
Date Time Interrogation Session: 20160819123226
HIGH POWER IMPEDANCE MEASURED VALUE: 74 Ohm
HighPow Impedance: 74 Ohm
Lead Channel Impedance Value: 390 Ohm
Lead Channel Pacing Threshold Amplitude: 0.75 V
Lead Channel Pacing Threshold Pulse Width: 0.5 ms
Lead Channel Pacing Threshold Pulse Width: 0.8 ms
Lead Channel Sensing Intrinsic Amplitude: 11.9 mV
Lead Channel Setting Pacing Amplitude: 2.5 V
Lead Channel Setting Pacing Pulse Width: 0.5 ms
MDC IDC MSMT BATTERY VOLTAGE: 2.98 V
MDC IDC MSMT LEADCHNL RA IMPEDANCE VALUE: 380 Ohm
MDC IDC MSMT LEADCHNL RA SENSING INTR AMPL: 2.1 mV
MDC IDC MSMT LEADCHNL RV PACING THRESHOLD AMPLITUDE: 1.25 V
MDC IDC SET LEADCHNL RA PACING AMPLITUDE: 2 V
MDC IDC SET LEADCHNL RV SENSING SENSITIVITY: 0.5 mV
MDC IDC STAT BRADY AP VS PERCENT: 73 %
MDC IDC STAT BRADY RV PERCENT PACED: 23 %
Pulse Gen Serial Number: 7170419
Zone Setting Detection Interval: 250 ms

## 2015-02-18 ENCOUNTER — Encounter: Payer: Self-pay | Admitting: Cardiology

## 2015-03-07 ENCOUNTER — Encounter: Payer: Self-pay | Admitting: Cardiology

## 2015-04-25 ENCOUNTER — Encounter: Payer: BC Managed Care – PPO | Admitting: *Deleted

## 2015-04-26 ENCOUNTER — Telehealth: Payer: Self-pay | Admitting: Cardiology

## 2015-04-26 NOTE — Telephone Encounter (Signed)
LMOVM reminding pt to send remote transmission.   

## 2015-04-27 ENCOUNTER — Encounter: Payer: Self-pay | Admitting: Cardiology

## 2015-05-02 ENCOUNTER — Ambulatory Visit (INDEPENDENT_AMBULATORY_CARE_PROVIDER_SITE_OTHER): Payer: BC Managed Care – PPO | Admitting: *Deleted

## 2015-05-02 DIAGNOSIS — I5032 Chronic diastolic (congestive) heart failure: Secondary | ICD-10-CM

## 2015-05-02 DIAGNOSIS — I472 Ventricular tachycardia: Secondary | ICD-10-CM | POA: Diagnosis not present

## 2015-05-02 DIAGNOSIS — I4729 Other ventricular tachycardia: Secondary | ICD-10-CM

## 2015-05-04 ENCOUNTER — Encounter: Payer: Self-pay | Admitting: Cardiology

## 2015-05-04 LAB — CUP PACEART REMOTE DEVICE CHECK
Battery Remaining Longevity: 63 mo
Battery Voltage: 2.96 V
Brady Statistic AP VP Percent: 24 %
Brady Statistic AS VP Percent: 1 %
Brady Statistic AS VS Percent: 3.6 %
Brady Statistic RA Percent Paced: 94 %
Date Time Interrogation Session: 20161121080018
HighPow Impedance: 73 Ohm
HighPow Impedance: 73 Ohm
Implantable Lead Implant Date: 20090416
Implantable Lead Location: 753860
Implantable Lead Model: 7122
Lead Channel Sensing Intrinsic Amplitude: 11.9 mV
Lead Channel Setting Pacing Amplitude: 2 V
Lead Channel Setting Pacing Amplitude: 2.5 V
Lead Channel Setting Pacing Pulse Width: 0.5 ms
MDC IDC LEAD IMPLANT DT: 20090416
MDC IDC LEAD LOCATION: 753859
MDC IDC MSMT BATTERY REMAINING PERCENTAGE: 79 %
MDC IDC MSMT LEADCHNL RA IMPEDANCE VALUE: 380 Ohm
MDC IDC MSMT LEADCHNL RA SENSING INTR AMPL: 3 mV
MDC IDC MSMT LEADCHNL RV IMPEDANCE VALUE: 390 Ohm
MDC IDC SET LEADCHNL RV SENSING SENSITIVITY: 0.5 mV
MDC IDC STAT BRADY AP VS PERCENT: 72 %
MDC IDC STAT BRADY RV PERCENT PACED: 24 %
Pulse Gen Serial Number: 7170419

## 2015-05-04 NOTE — Progress Notes (Signed)
Remote ICD transmission.   

## 2015-05-16 ENCOUNTER — Ambulatory Visit: Payer: BC Managed Care – PPO | Admitting: Nurse Practitioner

## 2015-05-30 ENCOUNTER — Encounter: Payer: Self-pay | Admitting: Nurse Practitioner

## 2015-05-30 ENCOUNTER — Ambulatory Visit (INDEPENDENT_AMBULATORY_CARE_PROVIDER_SITE_OTHER): Payer: BC Managed Care – PPO | Admitting: Nurse Practitioner

## 2015-05-30 VITALS — BP 116/70 | HR 72 | Resp 16 | Ht 64.75 in | Wt 183.0 lb

## 2015-05-30 DIAGNOSIS — Z01419 Encounter for gynecological examination (general) (routine) without abnormal findings: Secondary | ICD-10-CM

## 2015-05-30 DIAGNOSIS — Z8679 Personal history of other diseases of the circulatory system: Secondary | ICD-10-CM

## 2015-05-30 DIAGNOSIS — I259 Chronic ischemic heart disease, unspecified: Secondary | ICD-10-CM

## 2015-05-30 DIAGNOSIS — Z0000 Encounter for general adult medical examination without abnormal findings: Secondary | ICD-10-CM

## 2015-05-30 DIAGNOSIS — Z95810 Presence of automatic (implantable) cardiac defibrillator: Secondary | ICD-10-CM

## 2015-05-30 LAB — POCT URINALYSIS DIPSTICK
BILIRUBIN UA: NEGATIVE
Blood, UA: NEGATIVE
GLUCOSE UA: NEGATIVE
Ketones, UA: NEGATIVE
Leukocytes, UA: NEGATIVE
Nitrite, UA: NEGATIVE
Protein, UA: NEGATIVE
Urobilinogen, UA: NEGATIVE
pH, UA: 5

## 2015-05-30 NOTE — Progress Notes (Signed)
Encounter reviewed Zonya Gudger, MD   

## 2015-05-30 NOTE — Patient Instructions (Signed)

## 2015-05-30 NOTE — Progress Notes (Signed)
49 y.o. G0P0 Single  Caucasian Fe here for annual exam.  Last year in December has 5 th ablation  Patient's last menstrual period was 10/06/2011.          Sexually active: Yes.    The current method of family planning is post menopausal status.    Exercising: No.  exercise Smoker:  no  Health Maintenance: Pap:  02-23-13 neg HPV HR neg MMG:  05-13-15 category c density,birads 1:neg Colonoscopy:  04-22-11 tubular adenoma f/u 5 yrs TDaP:  2008 Shingles: none Pneumonia: none Hep C and HIV: done 2016 neg  Labs: POCT urine-neg will get Lipids done at cardio in January Self breast exam: done monthly   reports that she has never smoked. She has never used smokeless tobacco. She reports that she drinks about 0.6 oz of alcohol per week. She reports that she does not use illicit drugs.  Past Medical History  Diagnosis Date  . Hypertrophic obstructive cardiomyopathy (HOCM) ([place])     TEE 09/02/2007 showed moderate to severe left ventricular hypertrophy with an interventricular septal dimension of 1.5 cm and posterior wall dimension of 7 cm; there was normal LV systolic function and estimated EF of approximately 60%; there did not appear to be any obvious outflow tract obstruction.   . Atrial fibrillation ([place])     S/P radiofrequency ablation by Dr. Adrian Prows at [facility] on 02/09/2008 and again on 03/31/2009; S/P ablation at [facility] by Dr. Westly Pam on 08/10/2010, followed by recurrent atrial flutter/fibrillation.  Cardiac cath done on 02/19/2011 at [facility] showed no significant coronary disease.  Patient underwent Convergent Procedure at [facility] on 03/07/2011.   . Atrial tachycardia [facility])     Status post convergent ablation [facility] October 2012.  She underwent successful catheter ablation of persistent atrial tachycardia at [facility] by Dr. Willis Modena on 05/24/2014.    Marland Kitchen Ventricular tachycardia ([place])     Hx of nonsustained ventricular tachycardia, S/P hospitalization in March 2009; S/P ICD placement by  Dr. Deboraha Sprang on 09/25/2007.  Marland Kitchen Anxiety     Acute stress reaction to multiple ICD shocks  . Seasonal allergic rhinitis   . GERD (gastroesophageal reflux disease)   . Hyperglycemia   . Dizziness   . Anemia   . Cholecystitis, acute 07/2001    S/P laparoscopic cholecystectomy, intraoperative cholangiogram, and transcystic common bile duct exploration by Dr. Jackolyn Confer on 07/14/2001  . Pelvic mass     Pelvic ultrasound done on 11/30/10 to evaluate abdominal pain showed,  positioned between the ovaries and contiguous with the ovaries, a complex mass demonstrating posterior acoustical enhancement and no definite intralesional flow with color Doppler exam measuring 5.2 x 3.1 x 5.5 cm. The appearance raised the possibility of a ruptured ovarian cyst with subsequent development of a hematoma.   . Fibroids   . 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 Anson Fret to evaluate anemia; pathology showed a tubulovillous adenoma with no high grade dysplasia or malignancy identified.    . Abnormal thyroid blood test     Mildly elevated TSH, resolved.  Marland Kitchen HYPERGLYCEMIA 12/24/2008    Qualifier: Diagnosis of  By: Marinda Elk MD, Sonia Side    . Appendicitis, acute, s/p apendectomy 01/17/2013  . GI bleed 05/25/2011    Past Surgical History  Procedure Laterality Date  . Cardiac defibrillator placement  09/25/2007; 08/2013    Placed by Dr. Deboraha Sprang on 09/25/2007;  gen change 08/2013 by Dr Caryl Comes (STJ dual chamber ICD)  . Laparoscopic cholecystectomy  07/14/2001    S/P laparoscopic cholecystectomy, intraoperative cholangiogram, and transcystic common bile duct exploration by Dr. Jackolyn Confer on 07/14/2001  . Convergent ablation  03/07/2011    Patient underwent Convergent Procedure at [facility] on 03/07/2011.  . Radiofrequency ablation  08/10/2010    ablation  done at [facility]  . Radiofrequency ablation  03/2009    ablation at [facility]  . Radiofrequency  ablation  01/2008    ablation at [facility]  . Flexible sigmoidoscopy  05/26/2011    Procedure: FLEXIBLE SIGMOIDOSCOPY;  Surgeon: Lear Ng, MD;  Location: [place];  Service: Endoscopy;  Laterality: N/A;  . Laparoscopic appendectomy N/A 01/17/2013    Procedure: APPENDECTOMY LAPAROSCOPIC;  Surgeon: Rolm Bookbinder, MD;  Location: [place];  Service: General;  Laterality: N/A;  . Appendectomy    . Implantable cardioverter defibrillator (icd) generator change N/A 08/12/2013    Procedure: ICD GENERATOR CHANGE;  Surgeon: Deboraha Sprang, MD;  Location: [facility] CATH LAB;  Service: Cardiovascular;  Laterality: N/A;  . Radiofrequency ablation  05/24/2014    Successful radiofrequency catheter ablation by Dr. Willis Modena at [facility].    Current Outpatient Prescriptions  Medication Sig Dispense Refill  . apixaban (ELIQUIS) 5 MG TABS tablet Take 5 mg by mouth 2 (two) times daily.    Marland Kitchen dofetilide (TIKOSYN) 500 MCG capsule Take 500 mcg by mouth 2 (two) times daily.      . fluticasone (FLONASE) 50 MCG/ACT nasal spray Place 2 sprays into both nostrils daily.    . furosemide (LASIX) 20 MG tablet TAKE 1 TABLET (20 MG TOTAL) BY MOUTH DAILY. Can take additional 20 mg as needed for weight gain    . metoprolol succinate (TOPROL-XL) 100 MG 24 hr tablet Take 1/2 tablet in am and 1 tablet in pm    . Omega-3 Fatty Acids (FISH OIL) 1200 MG CAPS Take 1 capsule (1,200 mg total) by mouth daily.    . sertraline (ZOLOFT) 100 MG tablet Take 50 mg by mouth daily.  1  . SOY ISOFLAVONE PO Take 80 mg by mouth daily.     . vitamin E 400 UNIT capsule Take 400 Units by mouth daily.    . [DISCONTINUED] esomeprazole (NEXIUM) 20 MG capsule Take 1 capsule (20 mg total) by mouth daily. 30 capsule 0   No current facility-administered medications for this visit.    Family History  Problem Relation Age of Onset  . Hypertrophic cardiomyopathy Sister   . Heart disease Sister   . Hypertension Sister   . Hypertrophic cardiomyopathy Cousin     3  cousins on mother's side Deceased  . Throat cancer Father   . Coronary artery disease Father 40    S/P CABG  . Heart disease Father   . Cancer Father     throat  . Breast cancer Paternal Aunt   . Colon polyps Mother   . Hypertrophic cardiomyopathy Mother   . Heart disease Mother   . Hyperlipidemia Mother   . Ovarian cancer Neg Hx     great aunt had  . Colon cancer Neg Hx   . Lung cancer Neg Hx   . Diabetes Neg Hx   . Hypertrophic cardiomyopathy Maternal Grandmother   . Hypertrophic cardiomyopathy      great uncle  . Cancer Maternal Aunt     breast  . Heart disease Sister     ROS:  Pertinent items are noted in HPI.  Otherwise, a comprehensive ROS was negative.  Exam:   BP 116/70 mmHg  Pulse 72  Resp 16  Ht 5' 4.75" (1.645 m)  Wt 183 lb (83.008 kg)  BMI 30.68 kg/m2  LMP 10/06/2011 Height: 5' 4.75" (164.5 cm) Ht Readings from Last 3 Encounters:  05/30/15 5' 4.75" (1.645 m)  12/09/14 5\' 6"  (1.676 m)  05/03/14 5' 5.5" (1.664 m)    General appearance: alert, cooperative and appears stated age Head: Normocephalic, without obvious abnormality, atraumatic Neck: no adenopathy, supple, symmetrical, trachea midline and thyroid normal to inspection and palpation Lungs: clear to auscultation bilaterally Breasts: normal appearance, no masses or tenderness Heart: regular rate and rhythm Abdomen: soft, non-tender; no masses,  no organomegaly Extremities: extremities normal, atraumatic, no cyanosis or edema Skin: Skin color, texture, turgor normal. No rashes or lesions Lymph nodes: Cervical, supraclavicular, and axillary nodes normal. No abnormal inguinal nodes palpated Neurologic: Grossly normal   Pelvic: External genitalia:  no lesions              Urethra:  normal appearing urethra with no masses, tenderness or lesions              Bartholin's and Skene's: normal                 Vagina: normal appearing vagina with normal color and discharge, no lesions               Cervix: anteverted              Pap taken: No. Bimanual Exam:  Uterus:  normal size, contour, position, consistency, mobility, non-tender              Adnexa: no mass, fullness, tenderness               Rectovaginal: Confirms               Anus:  normal sphincter tone, no lesions  Chaperone present: no  A:  Well Woman with normal exam  Postmenopausal no HRT ever History of IHSS with coronary ablation and ICD implantation 09/25/07 S/P coronary ablation with Thoracotomy 02/2011 Repeat coronary ablation 12/15 ( fifth one ) S/P Lap Appendectomy 01/17/13 history of Ovarian complex cyst or hematoma with resolution 9/14   P:   Reviewed health and wellness pertinent to exam  Pap smear as above  Mammogram is due 05/2016  Counseled on breast self exam, mammography screening, adequate intake of calcium and vitamin D, diet and exercise, Kegel's exercises return annually or prn  An After Visit Summary was printed and given to the patient.

## 2015-06-30 ENCOUNTER — Ambulatory Visit (INDEPENDENT_AMBULATORY_CARE_PROVIDER_SITE_OTHER): Payer: BC Managed Care – PPO | Admitting: Pulmonary Disease

## 2015-06-30 ENCOUNTER — Encounter: Payer: Self-pay | Admitting: Pulmonary Disease

## 2015-06-30 VITALS — BP 100/67 | HR 69 | Temp 98.0°F | Resp 18 | Wt 184.5 lb

## 2015-06-30 DIAGNOSIS — J3489 Other specified disorders of nose and nasal sinuses: Secondary | ICD-10-CM

## 2015-06-30 DIAGNOSIS — J329 Chronic sinusitis, unspecified: Principal | ICD-10-CM

## 2015-06-30 DIAGNOSIS — B9689 Other specified bacterial agents as the cause of diseases classified elsewhere: Secondary | ICD-10-CM

## 2015-06-30 MED ORDER — HYDROCODONE-HOMATROPINE 5-1.5 MG/5ML PO SYRP: 5.0000 mL | ORAL_SOLUTION | Freq: Four times a day (QID) | ORAL | Status: AC | PRN

## 2015-06-30 MED ORDER — AMOXICILLIN-POT CLAVULANATE 875-125 MG PO TABS: 1.0000 | ORAL_TABLET | Freq: Two times a day (BID) | ORAL | Status: DC

## 2015-06-30 NOTE — Patient Instructions (Addendum)
Sinusitis, Adult  Sinusitis is redness, soreness, and puffiness (inflammation) of the air pockets in the bones of your face (sinuses). The redness, soreness, and puffiness can cause air and mucus to get trapped in your sinuses. This can allow germs to grow and cause an infection.   HOME CARE    Drink enough fluids to keep your pee (urine) clear or pale yellow.   Use a humidifier in your home.   Run a hot shower to create steam in the bathroom. Sit in the bathroom with the door closed. Breathe in the steam 3-4 times a day.   Put a warm, moist washcloth on your face 3-4 times a day, or as told by your doctor.   Use salt water sprays (saline sprays) to wet the thick fluid in your nose. This can help the sinuses drain.   Only take medicine as told by your doctor.  GET HELP RIGHT AWAY IF:    Your pain gets worse.   You have very bad headaches.   You are sick to your stomach (nauseous).   You throw up (vomit).   You are very sleepy (drowsy) all the time.   Your face is puffy (swollen).   Your vision changes.   You have a stiff neck.   You have trouble breathing.  MAKE SURE YOU:    Understand these instructions.   Will watch your condition.   Will get help right away if you are not doing well or get worse.     This information is not intended to replace advice given to you by your health care provider. Make sure you discuss any questions you have with your health care provider.     Document Released: 11/14/2007 Document Revised: 06/18/2014 Document Reviewed: 01/01/2012  Elsevier Interactive Patient Education 2016 [employer] Inc.

## 2015-06-30 NOTE — Progress Notes (Signed)
Subjective:    Patient ID: [name], female    DOB: 06/19/1965, 50 y.o.   MRN: [id number]  HPI [name] is a 50 year old woman with history of HOCM, atrial fibrillation s/p ablations and convergent procedure on Eliquis, NSVT s/p ICD in 2009, allergic rhinitis, GERD presenting for evaluation of cough.  She has been feeling poorly since the end of December. She first noticed popping sensation in her right ear with otalgia. She used over the counter ear drops. She felt that her ear problems improved. Then she developed a sore throat. She has been coughing. She has had post tussive emesis. Her cough is productive of dark yellow sputum. No tobacco use. She works at [employer] and may have sick contacts.  She has rhinorrhea, post nasal drip, sinus pressure and pain.She has tried taking Robitussin and Mucinex prn. She takes Flonase daily.    Review of Systems Constitutional: no fevers, +chills Respiratory: no shortness of breath Cardiovascular: no chest pain Gastrointestinal: no nausea, no abdominal pain, no constipation, no diarrhea Musculoskeletal: no myalgias  Past Medical History  Diagnosis Date  . Hypertrophic obstructive cardiomyopathy (HOCM) ([place])     TEE 09/02/2007 showed moderate to severe left ventricular hypertrophy with an interventricular septal dimension of 1.5 cm and posterior wall dimension of 7 cm; there was normal LV systolic function and estimated EF of approximately 60%; there did not appear to be any obvious outflow tract obstruction.   . Atrial fibrillation ([place])     S/P radiofrequency ablation by Dr. Adrian [facility] at [facility] on 02/09/2008 and again on 03/31/2009; S/P ablation at [facility] by Dr. Westly Pam on 08/10/2010, followed by recurrent atrial flutter/fibrillation.  Cardiac cath done on 02/19/2011 at [facility] showed no significant coronary disease.  Patient underwent Convergent Procedure at [facility] on 03/07/2011.   . Atrial tachycardia [facility])      Status post convergent ablation [facility] October 2012.  She underwent successful catheter ablation of persistent atrial tachycardia at [facility] by Dr. Willis Modena on 05/24/2014.    Marland Kitchen Ventricular tachycardia ([place])     Hx of nonsustained ventricular tachycardia, S/P hospitalization in March 2009; S/P ICD placement by Dr. Deboraha Sprang on 09/25/2007.  Marland Kitchen Anxiety     Acute stress reaction to multiple ICD shocks  . Seasonal allergic rhinitis   . GERD (gastroesophageal reflux disease)   . Hyperglycemia   . Dizziness   . Anemia   . Cholecystitis, acute 07/2001    S/P laparoscopic cholecystectomy, intraoperative cholangiogram, and transcystic common bile duct exploration by Dr. Jackolyn Confer on 07/14/2001  . Pelvic mass     Pelvic ultrasound done on 11/30/10 to evaluate abdominal pain showed,  positioned between the ovaries and contiguous with the ovaries, a complex mass demonstrating posterior acoustical enhancement and no definite intralesional flow with color Doppler exam measuring 5.2 x 3.1 x 5.5 cm. The appearance raised the possibility of a ruptured ovarian cyst with subsequent development of a hematoma.   . Fibroids   . 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 Anson Fret to evaluate anemia; pathology showed a tubulovillous adenoma with no high grade dysplasia or malignancy identified.    . Abnormal thyroid blood test     Mildly elevated TSH, resolved.  Marland Kitchen HYPERGLYCEMIA 12/24/2008    Qualifier: Diagnosis of  By: Marinda Elk MD, Sonia Side    . Appendicitis, acute, s/p apendectomy 01/17/2013  .  GI bleed 05/25/2011    Current Outpatient Prescriptions on File Prior to Visit  Medication Sig Dispense Refill  . apixaban (ELIQUIS) 5 MG TABS tablet Take 5 mg by mouth 2 (two) times daily.    Marland Kitchen dofetilide (TIKOSYN) 500 MCG capsule Take 500 mcg by mouth 2 (two) times daily.      . fluticasone (FLONASE) 50 MCG/ACT nasal spray Place 2 sprays into both  nostrils daily.    . furosemide (LASIX) 20 MG tablet TAKE 1 TABLET (20 MG TOTAL) BY MOUTH DAILY. Can take additional 20 mg as needed for weight gain    . metoprolol succinate (TOPROL-XL) 100 MG 24 hr tablet Take 1/2 tablet in am and 1 tablet in pm    . Omega-3 Fatty Acids (FISH OIL) 1200 MG CAPS Take 1 capsule (1,200 mg total) by mouth daily.    . sertraline (ZOLOFT) 100 MG tablet Take 50 mg by mouth daily.  1  . SOY ISOFLAVONE PO Take 80 mg by mouth daily.     . vitamin E 400 UNIT capsule Take 400 Units by mouth daily.    . [DISCONTINUED] esomeprazole (NEXIUM) 20 MG capsule Take 1 capsule (20 mg total) by mouth daily. 30 capsule 0   No current facility-administered medications on file prior to visit.    Today's Vitals   06/30/15 1024  BP: 100/67  Pulse: 69  Temp: 98 F (36.7 C)  TempSrc: Oral  Resp: 18  Weight: 184 lb 8.2 oz (83.693 kg)  SpO2: 99%    Objective:   Physical Exam  Constitutional: She appears well-developed and well-nourished. No distress.  HENT:  Head: Normocephalic and atraumatic.  Right Ear: External ear and ear canal normal. Tympanic membrane is injected and bulging. A middle ear effusion is present.  Left Ear: External ear and ear canal normal. Tympanic membrane is injected.  Mouth/Throat: Oropharynx is clear and moist. No oropharyngeal exudate.  Neck: Neck supple.  Cardiovascular: Normal rate, regular rhythm and normal heart sounds.   Pulmonary/Chest: Effort normal and breath sounds normal. She has no wheezes. She has no rales.  Skin: Skin is warm and dry. No erythema.   Assessment & Plan:  Please refer to problem based charting.

## 2015-07-01 MED ORDER — AMOXICILLIN-POT CLAVULANATE 875-125 MG PO TABS: 1.0000 | ORAL_TABLET | Freq: Two times a day (BID) | ORAL | Status: AC

## 2015-07-01 NOTE — Assessment & Plan Note (Signed)
Assessment: She has been having upper respiratory symptoms for the past two weeks. Her cough is productive of yellow mucous. She has sinus pressure and pain. Probable at this point bacterial.  Plan: -Augmentin 875-125 mg every 12 hours for 7 days -HYDROcodone-homatropine 5-1.5 MG/5ML syrup 63ml Q6 hrs prn cough

## 2015-07-04 NOTE — Progress Notes (Signed)
Case discussed with Dr. Krall at time of visit.  We reviewed the resident's history and exam and pertinent patient test results.  I agree with the assessment, diagnosis, and plan of care documented in the resident's note. 

## 2015-07-07 ENCOUNTER — Telehealth: Payer: Self-pay | Admitting: Pharmacist

## 2015-07-07 ENCOUNTER — Encounter: Payer: Self-pay | Admitting: Pharmacist

## 2015-07-07 NOTE — Telephone Encounter (Signed)
[name] is a 50 y.o. female who was contacted via telephone for monitoring of apixaban (Eliquis) therapy.    ASSESSMENT Indication(s): atrial fibrillation Duration: indefinite  Labs:    Component Value Date/Time   AST 17 08/19/2014 0957   ALT 13 08/19/2014 0957   NA 145 12/09/2014 0904   K 4.5 12/09/2014 0904   CL 108 12/09/2014 0904   CO2 26 12/09/2014 0904   BUN 17 12/09/2014 0904   CREATININE 0.85 12/09/2014 0904   CREATININE 0.77 01/18/2013 0517   CALCIUM 9.2 12/09/2014 0904   GFRNONAA 80 12/09/2014 0904   GFRNONAA >90 01/18/2013 0517   WBC 5.9 08/19/2014 0957   HGB 12.5 08/19/2014 0957   HCT 39.2 08/19/2014 0957   PLT 289 08/19/2014 0957   apixaban (Eliquis) Dose: 5 mg BID  Safety: Patient reports no recent signs or symptoms of bleeding, no signs of symptoms of thromboembolism. Medication changes: no.  Adherence: Patient has no known adherence challenges. States the $10 copay card we provided is still active. She does correctly recite her dose.  Patient Instructions: Patient advised to contact clinic or seek medical attention if signs/symptoms of bleeding or thromboembolism occur. Patient verbalized understanding by repeating back information.  [place] Pharmacist 07/07/2015, 5:29 PM

## 2015-08-01 ENCOUNTER — Encounter: Payer: Self-pay | Admitting: Internal Medicine

## 2015-08-01 ENCOUNTER — Ambulatory Visit (INDEPENDENT_AMBULATORY_CARE_PROVIDER_SITE_OTHER): Payer: BC Managed Care – PPO | Admitting: Internal Medicine

## 2015-08-01 VITALS — BP 112/62 | HR 72 | Ht 64.75 in | Wt 186.0 lb

## 2015-08-01 DIAGNOSIS — I480 Paroxysmal atrial fibrillation: Secondary | ICD-10-CM | POA: Diagnosis not present

## 2015-08-01 DIAGNOSIS — I422 Other hypertrophic cardiomyopathy: Secondary | ICD-10-CM

## 2015-08-01 DIAGNOSIS — I499 Cardiac arrhythmia, unspecified: Secondary | ICD-10-CM

## 2015-08-01 DIAGNOSIS — Z95810 Presence of automatic (implantable) cardiac defibrillator: Secondary | ICD-10-CM

## 2015-08-01 LAB — CUP PACEART INCLINIC DEVICE CHECK
Battery Remaining Longevity: 63.6
Brady Statistic RA Percent Paced: 95 %
HighPow Impedance: 74.25 Ohm
Implantable Lead Implant Date: 20090416
Implantable Lead Location: 753860
Implantable Lead Model: 7122
Lead Channel Impedance Value: 400 Ohm
Lead Channel Pacing Threshold Amplitude: 0.75 V
Lead Channel Pacing Threshold Amplitude: 0.75 V
Lead Channel Pacing Threshold Amplitude: 1 V
Lead Channel Pacing Threshold Pulse Width: 0.5 ms
Lead Channel Pacing Threshold Pulse Width: 0.5 ms
Lead Channel Pacing Threshold Pulse Width: 0.8 ms
Lead Channel Sensing Intrinsic Amplitude: 11.9 mV
Lead Channel Setting Pacing Amplitude: 2 V
Lead Channel Setting Pacing Amplitude: 2.5 V
MDC IDC LEAD IMPLANT DT: 20090416
MDC IDC LEAD LOCATION: 753859
MDC IDC MSMT LEADCHNL RA PACING THRESHOLD PULSEWIDTH: 0.8 ms
MDC IDC MSMT LEADCHNL RA SENSING INTR AMPL: 3 mV
MDC IDC MSMT LEADCHNL RV IMPEDANCE VALUE: 387.5 Ohm
MDC IDC MSMT LEADCHNL RV PACING THRESHOLD AMPLITUDE: 1 V
MDC IDC SESS DTM: 20170220151836
MDC IDC SET LEADCHNL RV PACING PULSEWIDTH: 0.5 ms
MDC IDC SET LEADCHNL RV SENSING SENSITIVITY: 0.5 mV
MDC IDC STAT BRADY RV PERCENT PACED: 27 %
Pulse Gen Serial Number: 7170419

## 2015-08-01 NOTE — Patient Instructions (Signed)
Medication Instructions: - no changes  Labwork: - none  Procedures/Testing: - none  Follow-Up: - Remote monitoring is used to monitor your Pacemaker of ICD from home. This monitoring reduces the number of office visits required to check your device to one time per year. It allows [name] to keep an eye on the functioning of your device to ensure it is working properly. You are scheduled for a device check from home on 10/31/15. You may send your transmission at any time that day. If you have a wireless device, the transmission will be sent automatically. After your physician reviews your transmission, you will receive a postcard with your next transmission date.  - Your physician wants you to follow-up in: 1 year with Dr. Caryl Comes. You will receive a reminder letter in the mail two months in advance. If you don't receive a letter, please call our office to schedule the follow-up appointment.  Any Additional Special Instructions Will Be Listed Below (If Applicable).     If you need a refill on your cardiac medications before your next appointment, please call your pharmacy.

## 2015-08-01 NOTE — Progress Notes (Signed)
Patient Care Team: Bartholomew Crews, MD as PCP - General (Internal Medicine) Deboraha Sprang, MD as Consulting Physician (Cardiology) Pennie Banter, [facility] as Pharmacist (Pharmacist)   HPI  [name] is a 50 y.o. female Seen in followup for hypertrophic cardiomyopathy status post ICD implantation. She's also had atrial fibrillation for which she underwent catheter ablation at [facility] and then by Dr. Omelia Blackwater at [facility] and most recently a convergent procedure at [facility] 10/12. sHe also has a history of nonsustained ventricular tachycardia.  They decided to keep her on tikosyn and she is feeling pretty good  She is to be seen at [facility].  Past Medical History  Diagnosis Date  . Hypertrophic obstructive cardiomyopathy (HOCM) ([place])     TEE 09/02/2007 showed moderate to severe left ventricular hypertrophy with an interventricular septal dimension of 1.5 cm and posterior wall dimension of 7 cm; there was normal LV systolic function and estimated EF of approximately 60%; there did not appear to be any obvious outflow tract obstruction.   . Atrial fibrillation ([place])     S/P radiofrequency ablation by Dr. Adrian Prows at [facility] on 02/09/2008 and again on 03/31/2009; S/P ablation at [facility] by Dr. Westly Pam on 08/10/2010, followed by recurrent atrial flutter/fibrillation.  Cardiac cath done on 02/19/2011 at [facility] showed no significant coronary disease.  Patient underwent Convergent Procedure at [facility] on 03/07/2011.   . Atrial tachycardia [facility])     Status post convergent ablation [facility] October 2012.  She underwent successful catheter ablation of persistent atrial tachycardia at [facility] by Dr. Willis Modena on 05/24/2014.    Marland Kitchen Ventricular tachycardia ([place])     Hx of nonsustained ventricular tachycardia, S/P hospitalization in March 2009; S/P ICD placement by Dr. Deboraha Sprang on 09/25/2007.  Marland Kitchen Anxiety     Acute stress reaction to multiple ICD shocks  . Seasonal allergic rhinitis   .  GERD (gastroesophageal reflux disease)   . Hyperglycemia   . Dizziness   . Anemia   . Cholecystitis, acute 07/2001    S/P laparoscopic cholecystectomy, intraoperative cholangiogram, and transcystic common bile duct exploration by Dr. Jackolyn Confer on 07/14/2001  . Pelvic mass     Pelvic ultrasound done on 11/30/10 to evaluate abdominal pain showed,  positioned between the ovaries and contiguous with the ovaries, a complex mass demonstrating posterior acoustical enhancement and no definite intralesional flow with color Doppler exam measuring 5.2 x 3.1 x 5.5 cm. The appearance raised the possibility of a ruptured ovarian cyst with subsequent development of a hematoma.   . Fibroids   . 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 Anson Fret to evaluate anemia; pathology showed a tubulovillous adenoma with no high grade dysplasia or malignancy identified.    . Abnormal thyroid blood test     Mildly elevated TSH, resolved.  Marland Kitchen HYPERGLYCEMIA 12/24/2008    Qualifier: Diagnosis of  By: Marinda Elk MD, Sonia Side    . Appendicitis, acute, s/p apendectomy 01/17/2013  . GI bleed 05/25/2011    Past Surgical History  Procedure Laterality Date  . Cardiac defibrillator placement  09/25/2007; 08/2013    Placed by Dr. Deboraha Sprang on 09/25/2007; gen change 08/2013 by Dr Caryl Comes (STJ dual chamber ICD)  . Laparoscopic cholecystectomy  07/14/2001    S/P laparoscopic cholecystectomy, intraoperative cholangiogram, and transcystic common bile duct exploration by Dr. Jackolyn Confer on 07/14/2001  . Convergent ablation  03/07/2011    Patient underwent Convergent Procedure at [facility] on 03/07/2011.  . Radiofrequency ablation  08/10/2010    ablation  done at [facility]  . Radiofrequency ablation  03/2009    ablation at [facility]  . Radiofrequency ablation  01/2008    ablation at [facility]  . Flexible sigmoidoscopy  05/26/2011    Procedure: FLEXIBLE SIGMOIDOSCOPY;  Surgeon:  Lear Ng, MD;  Location: [place];  Service: Endoscopy;  Laterality: N/A;  . Laparoscopic appendectomy N/A 01/17/2013    Procedure: APPENDECTOMY LAPAROSCOPIC;  Surgeon: Rolm Bookbinder, MD;  Location: [place];  Service: General;  Laterality: N/A;  . Appendectomy    . Implantable cardioverter defibrillator (icd) generator change N/A 08/12/2013    Procedure: ICD GENERATOR CHANGE;  Surgeon: Deboraha Sprang, MD;  Location: [facility] CATH LAB;  Service: Cardiovascular;  Laterality: N/A;  . Radiofrequency ablation  05/24/2014    Successful radiofrequency catheter ablation by Dr. Willis Modena at [facility].    Current Outpatient Prescriptions  Medication Sig Dispense Refill  . apixaban (ELIQUIS) 5 MG TABS tablet Take 5 mg by mouth 2 (two) times daily.    Marland Kitchen dofetilide (TIKOSYN) 500 MCG capsule Take 500 mcg by mouth 2 (two) times daily.      . fluticasone (FLONASE) 50 MCG/ACT nasal spray Place 2 sprays into both nostrils daily.    . furosemide (LASIX) 20 MG tablet TAKE 1 TABLET (20 MG TOTAL) BY MOUTH DAILY. Can take additional 20 mg as needed for weight gain    . metoprolol succinate (TOPROL-XL) 100 MG 24 hr tablet Take 1/2 tablet in am and 1 tablet in pm    . Omega-3 Fatty Acids (FISH OIL) 1200 MG CAPS Take 1 capsule (1,200 mg total) by mouth daily.    . sertraline (ZOLOFT) 100 MG tablet Take 50 mg by mouth daily.  1  . SOY ISOFLAVONE PO Take 80 mg by mouth daily.     . vitamin E 400 UNIT capsule Take 400 Units by mouth daily.    . [DISCONTINUED] esomeprazole (NEXIUM) 20 MG capsule Take 1 capsule (20 mg total) by mouth daily. 30 capsule 0   No current facility-administered medications for this visit.    No Known Allergies  Review of Systems negative except from HPI and PMH  Physical Exam BP 112/62 mmHg  Pulse 72  Ht 5' 4.75" (1.645 m)  Wt 186 lb (84.369 kg)  BMI 31.18 kg/m2  LMP 10/06/2011 Well developed and well nourished in no acute distress HENT normal E scleral and icterus clear Neck  Supple JVP flat; carotids brisk and full Clear to ausculation Regular rate and rhythm, no murmurs gallops or rub Soft with active bowel sounds No clubbing cyanosis Trace Edema Alert and oriented, grossly normal motor and sensory function Skin Warm and Dry  ECG demonstrates  Atrial pacing with intrinsic conduction as well as nonrepetitive reentrant AV synchrony.  Assessment and  Plan  HCM  Atrial Fibrillation RNRAVS ICD -S Judes  With her repetitive non-reentrant AV synchrony, we will reprogram her device DDI. Her atrial fibrillation burden appears to be very small. Her ventricular pacing percentage of 27% seems, basilar seen here in the office likely large part related to the RNRAVS  Her QTc interval is well within range at 440 ms for her normally conducted beats. She is to have blood work drawn tomorrow at [facility]; we'll not draw today.

## 2015-09-01 ENCOUNTER — Encounter: Payer: Self-pay | Admitting: Internal Medicine

## 2015-09-01 ENCOUNTER — Ambulatory Visit (INDEPENDENT_AMBULATORY_CARE_PROVIDER_SITE_OTHER): Payer: BC Managed Care – PPO | Admitting: Internal Medicine

## 2015-09-01 VITALS — BP 113/63 | HR 59 | Temp 98.4°F | Ht 64.9 in | Wt 187.8 lb

## 2015-09-01 DIAGNOSIS — R1013 Epigastric pain: Secondary | ICD-10-CM

## 2015-09-01 NOTE — Patient Instructions (Signed)
1. I will notify you of your test results

## 2015-09-02 LAB — HEPATIC FUNCTION PANEL
ALK PHOS: 89 IU/L (ref 39–117)
ALT: 14 IU/L (ref 0–32)
AST: 19 IU/L (ref 0–40)
Albumin: 4.4 g/dL (ref 3.5–5.5)
BILIRUBIN TOTAL: 0.6 mg/dL (ref 0.0–1.2)
BILIRUBIN, DIRECT: 0.17 mg/dL (ref 0.00–0.40)
Total Protein: 6.4 g/dL (ref 6.0–8.5)

## 2015-09-02 LAB — H. PYLORI ANTIBODY, IGG: H Pylori IgG: <0.9 U/mL (ref 0.0–0.8)

## 2015-09-02 NOTE — Assessment & Plan Note (Addendum)
3 weeks of intermittent nausea and epigastric pain that usually comes on 15-20 min after a meal, most commonly dinner. The nausea is sig, causing her to just go to bed. Vomiting has never occurred. The [pain is a dull ache in the epigastric region, like a hunger pain. If she takes her H2B, the dull pain goes away but the nausea persists. She also feels bloated and full. She has a lot of gas and took Gas X once. She has had abd fluid from the combined HF but this feels different and even feels different from her heart burn. Her BM are nl. She takes no NSAID and has 2-4 alcoholic drinks per month. She has had her gallbladder and appendix out. She has had an ovarian cyst but recent imaging shows that it is decreasing. No travel outside of the [name].  Exam is nl but she is having no sxs today.  This seems to represent dyspepsia. However, she is clear that this is different from her nl heartburn sxs and has no RF for H pylori. No RF for PUD either (ETOH or NSAID) although sxs are consistent. Gastroparesis is also in diff dx although again no RF. Malignancy is low on list but is possible.   She had been sent home once on PPI after a procedure and had sig SE and was told to stop.   Assessment : dyspepsia  PLAN : LFT's H pylori Ab H2B although will rec PPI since blood work so far nl U/S (eval pancreas, hopefully ovaries, liver) If no dx, EGD and /or GES

## 2015-09-02 NOTE — Addendum Note (Signed)
Addended by: Larey Dresser A on: 09/02/2015 12:49 PM   Modules accepted: Orders

## 2015-09-02 NOTE — Progress Notes (Signed)
   Subjective:    Patient ID: [name], female    DOB: May 25, 1966, 50 y.o.   MRN: [id number]  HPI  [name] is here for ABD pain. Please see the A&P for the status of the pt's chronic medical problems.  ROS : per ROS section and in problem oriented charting. All other systems are negative.  PMHx, Soc hx, and / or Fam hx : Her roommate's dog died last PM. 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 Works at [employer].  Review of Systems  Constitutional: Positive for activity change. Negative for unexpected weight change.  Cardiovascular: Negative for chest pain.  Gastrointestinal: Positive for nausea, abdominal pain and abdominal distention. Negative for vomiting, diarrhea, constipation and blood in stool.  Genitourinary: Negative for dysuria and difficulty urinating.  Musculoskeletal: Negative for back pain.       Objective:   Physical Exam  Constitutional: She is oriented to person, place, and time. She appears well-developed and well-nourished. No distress.  HENT:  Head: Normocephalic and atraumatic.  Right Ear: External ear normal.  Left Ear: External ear normal.  Nose: Nose normal.  Eyes: Conjunctivae and EOM are normal.  Cardiovascular: Normal rate, regular rhythm and normal heart sounds.   Pulmonary/Chest: Effort normal and breath sounds normal. No respiratory distress.  Abdominal: Soft. Bowel sounds are normal. She exhibits no distension and no mass. There is no tenderness. There is no rebound and no guarding.  No CVA tenderness  Musculoskeletal: Normal range of motion. She exhibits no edema.  Neurological: She is alert and oriented to person, place, and time.  Skin: Skin is warm and dry. She is not diaphoretic.  Psychiatric: She has a normal mood and affect. Her behavior is normal. Judgment and thought content normal.          Assessment & Plan:

## 2015-09-05 LAB — LIPASE: Lipase: 53 U/L (ref 0–59)

## 2015-09-05 LAB — SPECIMEN STATUS REPORT

## 2015-09-12 ENCOUNTER — Ambulatory Visit (HOSPITAL_COMMUNITY)
Admission: RE | Admit: 2015-09-12 | Discharge: 2015-09-12 | Disposition: A | Discharge status: Home / Self Care | Payer: BC Managed Care – PPO | Source: Ambulatory Visit | Attending: Internal Medicine | Admitting: Internal Medicine

## 2015-09-12 DIAGNOSIS — K769 Liver disease, unspecified: Secondary | ICD-10-CM | POA: Insufficient documentation

## 2015-09-12 DIAGNOSIS — R1013 Epigastric pain: Secondary | ICD-10-CM | POA: Diagnosis present

## 2015-09-12 DIAGNOSIS — Z9049 Acquired absence of other specified parts of digestive tract: Secondary | ICD-10-CM | POA: Insufficient documentation

## 2015-09-16 ENCOUNTER — Encounter: Payer: Self-pay | Admitting: Internal Medicine

## 2015-10-31 ENCOUNTER — Telehealth: Payer: Self-pay | Admitting: Cardiology

## 2015-10-31 ENCOUNTER — Encounter: Payer: BC Managed Care – PPO | Admitting: *Deleted

## 2015-10-31 NOTE — Telephone Encounter (Signed)
Spoke with pt and reminded pt of remote transmission that is due today. Pt verbalized understanding.   

## 2015-11-04 ENCOUNTER — Encounter: Payer: Self-pay | Admitting: Cardiology

## 2015-11-28 ENCOUNTER — Encounter: Payer: Self-pay | Admitting: Internal Medicine

## 2015-11-28 ENCOUNTER — Ambulatory Visit (INDEPENDENT_AMBULATORY_CARE_PROVIDER_SITE_OTHER): Payer: BC Managed Care – PPO | Admitting: Internal Medicine

## 2015-11-28 VITALS — BP 105/66 | HR 60 | Temp 98.1°F | Ht 66.0 in | Wt 189.6 lb

## 2015-11-28 DIAGNOSIS — H905 Unspecified sensorineural hearing loss: Secondary | ICD-10-CM | POA: Diagnosis not present

## 2015-11-28 MED ORDER — PREDNISONE 20 MG PO TABS: 60.0000 mg | ORAL_TABLET | Freq: Every day | ORAL | Status: DC

## 2015-11-28 NOTE — Assessment & Plan Note (Signed)
Acute sensorineural hearing loss of unknown etiology of left ear.  Will start prednisone 60mg  daily for 10 days. RTC in 1 week to assess for improvement. Referral to ENT CT head to evaluate brain lesions (can't get MRI 2/2 to ICD).

## 2015-11-28 NOTE — Progress Notes (Signed)
   Subjective:    Patient ID: [name], female    DOB: [birth year], 50 y.o.   MRN: [id number]  HPI  50 yo F with Afib on eliquis, chronic CHF, HOCM, GERD, anxiety presents with acute hearing loss.  So having left sided muffled hearing for 7-10 days, also having ringing in the ear since 2 days ago. No ear pain. No injuries, no new meds, no new ear infections. No headaches, no numbness/tingling/weakness.     Review of Systems  Constitutional: Negative for fever, chills and fatigue.  HENT: Positive for hearing loss and tinnitus. Negative for congestion, ear discharge, ear pain, rhinorrhea and sore throat.   Respiratory: Negative for chest tightness, shortness of breath and wheezing.   Cardiovascular: Negative for chest pain, palpitations and leg swelling.  Gastrointestinal: Negative for abdominal pain and abdominal distention.  Neurological: Negative for dizziness, seizures, weakness and numbness.       Objective:   Physical Exam  Constitutional: She is oriented to person, place, and time. She appears well-developed and well-nourished. No distress.  HENT:  Head: Normocephalic and atraumatic.  Right Ear: External ear normal.  Left Ear: External ear normal.  Normal TM's bilaterally  Eyes: Conjunctivae are normal. Pupils are equal, round, and reactive to light. Right eye exhibits no discharge. Left eye exhibits no discharge. No scleral icterus.  Neck: Normal range of motion.  Cardiovascular: Normal rate and regular rhythm.  Exam reveals no gallop and no friction rub.   No murmur heard. Pulmonary/Chest: Effort normal and breath sounds normal. No respiratory distress. She has no wheezes.  Musculoskeletal: Normal range of motion. She exhibits no edema or tenderness.  Neurological: She is alert and oriented to person, place, and time. No cranial nerve deficit. Coordination normal.  Weber test heard better on right ear (good ear).   Rinne is normal on left ear (bad ear).   Skin: She  is not diaphoretic.     Filed Vitals:   11/28/15 1559 11/28/15 1600  BP:  105/66  Pulse:  60  Temp: 98.1 F (36.7 C)         Assessment & Plan:  See problem based a&p.

## 2015-11-28 NOTE — Patient Instructions (Signed)
Take prednisone 60mg  daily for next 10 days.  Come back in 1 week for follow up.  In the mean time we will send you to ENT  We will get CT scan of your head.

## 2015-11-29 ENCOUNTER — Ambulatory Visit (HOSPITAL_COMMUNITY)
Admission: RE | Admit: 2015-11-29 | Discharge: 2015-11-29 | Disposition: A | Discharge status: Home / Self Care | Payer: BC Managed Care – PPO | Source: Ambulatory Visit | Attending: Internal Medicine | Admitting: Internal Medicine

## 2015-11-29 DIAGNOSIS — H905 Unspecified sensorineural hearing loss: Secondary | ICD-10-CM | POA: Insufficient documentation

## 2015-11-29 NOTE — Progress Notes (Signed)
Internal Medicine Clinic Attending  Case discussed with Dr. Ahmed at the time of the visit.  We reviewed the resident's history and exam and pertinent patient test results.  I agree with the assessment, diagnosis, and plan of care documented in the resident's note. 

## 2015-11-30 ENCOUNTER — Telehealth: Payer: Self-pay

## 2015-11-30 NOTE — Telephone Encounter (Signed)
Called. Left message.

## 2015-11-30 NOTE — Telephone Encounter (Signed)
Spoke to Blanch. No SE to prednisone. No change in sxs. Ct negative. Waiting ENT

## 2015-11-30 NOTE — Telephone Encounter (Signed)
Pt requesting x-ray result. Please call pt back after 3:30 pm.

## 2015-12-05 NOTE — Progress Notes (Addendum)
[name] is a 50 y.o. female who was contacted via telephone previously for monitoring of apixaban (Eliquis) therapy.    ASSESSMENT Indication(s): atrial fibrillation Duration: indefinite  Labs:    Component Value Date/Time   AST 19 09/01/2015 1454   ALT 14 09/01/2015 1454   NA 145 12/09/2014 0904   K 4.5 12/09/2014 0904   CL 108 12/09/2014 0904   CO2 26 12/09/2014 0904   GLUCOSE 85 12/09/2014 0904   HGBA1C 6.0 08/19/2014 1006   HGBA1C 5.6 12/22/2008 0900   BUN 17 12/09/2014 0904   CREATININE 0.85 12/09/2014 0904   CREATININE 0.77 01/18/2013 0517   CALCIUM 9.2 12/09/2014 0904   GFRNONAA 80 12/09/2014 0904   GFRNONAA >90 01/18/2013 0517   GFRAA >89 12/09/2014 0904   GFRAA >90 01/18/2013 0517   WBC 5.9 08/19/2014 0957   HGB 12.5 08/19/2014 0957   HCT 39.2 08/19/2014 0957   PLT 289 08/19/2014 0957    apixaban (Eliquis) Dose: 5 mg BID  Safety: Patient stated in January that she has not had any signs/symptoms of bleeding or thrombosis. She has been on this medication since 2012.    Adherence: Patient reports no known adherence challenges. Contacted pharmacy and records indicate refills are consistent. Recent refills (one month supply):  6/6, 5/11, 4/10. Very adherent and stated previously co-pay $10/month and affordable for her.    Follow-up Can consider BMP and CBC at future visit  Angelena Form PharmD Candidate  12/05/2015, 12:57 PM

## 2016-06-18 ENCOUNTER — Ambulatory Visit: Payer: BC Managed Care – PPO | Admitting: Nurse Practitioner

## 2016-07-20 ENCOUNTER — Encounter: Payer: Self-pay | Admitting: Nurse Practitioner

## 2016-07-20 NOTE — Progress Notes (Signed)
Patient ID: [name], female   DOB: April 30, 1966, 51 y.o.   MRN: [id number]  50 y.o. [id number] Significant Other Caucasian Fe here for annual exam.  Some vaso symptoms that are tolerable.  She has not had any major cardiac problems this past year.  Patient's last menstrual period was 10/06/2011.          Sexually active: Yes.    The current method of family planning is post menopausal status.    Exercising: No.  The patient does not participate in regular exercise at present. Smoker:  no  Health Maintenance: Pap: 02/23/13, Negative with neg HR HPV  02/18/12, Negative MMG:05/13/15, Bi-Rads 1: Negative rescheduled for 08/13/16 Colonoscopy: 05/22/11, tubular adenoma, repeat in 5 years [id number] TDaP: 12/17/06 Hep C and HIV: 08/19/14 Labs: discuss today   Urine: Negative   reports that she has never smoked. She has never used smokeless tobacco. She reports that she drinks about 0.6 oz of alcohol per week . She reports that she does not use drugs.  Past Medical History:  Diagnosis Date  . Abnormal thyroid blood test    Mildly elevated TSH, resolved.  . 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 Anson Fret to evaluate anemia; pathology showed a tubulovillous adenoma with no high grade dysplasia or malignancy identified.    . Anemia   . Anxiety    Acute stress reaction to multiple ICD shocks  . Appendicitis, acute, s/p apendectomy 01/17/2013  . Atrial fibrillation ([employer])    S/P radiofrequency ablation by Dr. Adrian Prows at [facility] on 02/09/2008 and again on 03/31/2009; S/P ablation at [facility] by Dr. Westly Pam on 08/10/2010, followed by recurrent atrial flutter/fibrillation.  Cardiac cath done on 02/19/2011 at [facility] showed no significant coronary disease.  Patient underwent Convergent Procedure at [facility] on 03/07/2011.   . Atrial tachycardia [facility])    Status post convergent ablation [facility] October 2012.  She  underwent successful catheter ablation of persistent atrial tachycardia at [facility] by Dr. Willis Modena on 05/24/2014.    Marland Kitchen Cholecystitis, acute 07/2001   S/P laparoscopic cholecystectomy, intraoperative cholangiogram, and transcystic common bile duct exploration by Dr. Jackolyn Confer on 07/14/2001  . Dizziness   . Fibroids   . GERD (gastroesophageal reflux disease)   . GI bleed 05/25/2011  . Hyperglycemia   . HYPERGLYCEMIA 12/24/2008   Qualifier: Diagnosis of  By: Marinda Elk MD, Sonia Side    . Hypertrophic obstructive cardiomyopathy (HOCM) ([place])    TEE 09/02/2007 showed moderate to severe left ventricular hypertrophy with an interventricular septal dimension of 1.5 cm and posterior wall dimension of 7 cm; there was normal LV systolic function and estimated EF of approximately 60%; there did not appear to be any obvious outflow tract obstruction.   . Pelvic mass    Pelvic ultrasound done on 11/30/10 to evaluate abdominal pain showed,  positioned between the ovaries and contiguous with the ovaries, a complex mass demonstrating posterior acoustical enhancement and no definite intralesional flow with color Doppler exam measuring 5.2 x 3.1 x 5.5 cm. The appearance raised the possibility of a ruptured ovarian cyst with subsequent development of a hematoma.   . Seasonal allergic rhinitis   . Ventricular tachycardia ([place])    Hx of nonsustained ventricular tachycardia, S/P hospitalization in March 2009; S/P ICD placement by Dr. Deboraha Sprang on 09/25/2007.    Past Surgical History:  Procedure Laterality Date  . APPENDECTOMY    .  CARDIAC DEFIBRILLATOR PLACEMENT  09/25/2007; 08/2013   Placed by Dr. Deboraha Sprang on 09/25/2007; gen change 08/2013 by Dr Caryl Comes (STJ dual chamber ICD)  . Convergent Ablation  03/07/2011   Patient underwent Convergent Procedure at [facility] on 03/07/2011.  Marland Kitchen FLEXIBLE SIGMOIDOSCOPY  05/26/2011   Procedure: FLEXIBLE SIGMOIDOSCOPY;  Surgeon: Lear Ng, MD;  Location: [facility] ENDOSCOPY;  Service:  Endoscopy;  Laterality: N/A;  . IMPLANTABLE CARDIOVERTER DEFIBRILLATOR (ICD) GENERATOR CHANGE N/A 08/12/2013   Procedure: ICD GENERATOR CHANGE;  Surgeon: Deboraha Sprang, MD;  Location: [facility] CATH LAB;  Service: Cardiovascular;  Laterality: N/A;  . LAPAROSCOPIC APPENDECTOMY N/A 01/17/2013   Procedure: APPENDECTOMY LAPAROSCOPIC;  Surgeon: Rolm Bookbinder, MD;  Location: [place];  Service: General;  Laterality: N/A;  . LAPAROSCOPIC CHOLECYSTECTOMY  07/14/2001   S/P laparoscopic cholecystectomy, intraoperative cholangiogram, and transcystic common bile duct exploration by Dr. Jackolyn Confer on 07/14/2001  . RADIOFREQUENCY ABLATION  08/10/2010   ablation  done at [facility]  . RADIOFREQUENCY ABLATION  03/2009   ablation at [place]  01/2008   ablation at [place]  05/24/2014   Successful radiofrequency catheter ablation by Dr. Willis Modena at [facility].    Current Outpatient Prescriptions  Medication Sig Dispense Refill  . apixaban (ELIQUIS) 5 MG TABS tablet Take 5 mg by mouth 2 (two) times daily.    Marland Kitchen dofetilide (TIKOSYN) 500 MCG capsule Take 500 mcg by mouth 2 (two) times daily.      . fluticasone (FLONASE) 50 MCG/ACT nasal spray Place 2 sprays into both nostrils daily.    . furosemide (LASIX) 20 MG tablet TAKE 1 TABLET (20 MG TOTAL) BY MOUTH DAILY. Can take additional 20 mg as needed for weight gain    . metoprolol succinate (TOPROL-XL) 100 MG 24 hr tablet Take 1/2 tablet in am and 1 tablet in pm    . sertraline (ZOLOFT) 100 MG tablet Take 50 mg by mouth daily.  1   No current facility-administered medications for this visit.     Family History  Problem Relation Age of Onset  . Hypertrophic cardiomyopathy Sister   . Heart disease Sister   . Hypertension Sister   . Throat cancer Father   . Coronary artery disease Father 80    S/P CABG  . Heart disease Father   . Cancer Father     throat  . Colon polyps Mother   . Hypertrophic cardiomyopathy Mother   . Heart  disease Mother   . Hyperlipidemia Mother   . Cancer Maternal Aunt     breast  . Heart disease Sister   . Hypertrophic cardiomyopathy Cousin     3 cousins on mother's side Deceased  . Breast cancer Paternal Aunt   . Hypertrophic cardiomyopathy Maternal Grandmother   . Hypertrophic cardiomyopathy      great uncle  . Ovarian cancer Neg Hx     great aunt had  . Colon cancer Neg Hx   . Lung cancer Neg Hx   . Diabetes Neg Hx     ROS:  Pertinent items are noted in HPI.  Otherwise, a comprehensive ROS was negative.  Exam:   BP 116/70 (BP Location: Right Arm, Patient Position: Sitting, Cuff Size: Normal)   Pulse 76   Resp 16   Ht 5\' 6"  (1.676 m)   Wt 186 lb (84.4 kg)   LMP 10/06/2011   BMI 30.02 kg/m  Height: 5\' 6"  (167.6 cm) Ht Readings from Last 3 Encounters:  07/23/16 5\' 6"  (1.676 m)  11/28/15 5\' 6"  (1.676 m)  09/01/15 5' 4.9" (1.648 m)    General appearance: alert, cooperative and appears stated age Head: Normocephalic, without obvious abnormality, atraumatic Neck: no adenopathy, supple, symmetrical, trachea midline and thyroid normal to inspection and palpation Lungs: clear to auscultation bilaterally Breasts: normal appearance, no masses or tenderness Heart: regular rate and rhythm Abdomen: soft, non-tender; no masses,  no organomegaly Extremities: extremities normal, atraumatic, no cyanosis or edema Skin: Skin color, texture, turgor normal. No rashes or lesions Lymph nodes: Cervical, supraclavicular, and axillary nodes normal. No abnormal inguinal nodes palpated Neurologic: Grossly normal   Pelvic: External genitalia:  no lesions              Urethra:  normal appearing urethra with no masses, tenderness or lesions              Bartholin's and Skene's: normal                 Vagina: normal appearing vagina with normal color and discharge, no lesions              Cervix: anteverted              Pap taken: Yes.   Bimanual Exam:  Uterus:  normal size, contour,  position, consistency, mobility, non-tender              Adnexa: no mass, fullness, tenderness               Rectovaginal: Confirms               Anus:  normal sphincter tone, no lesions  Chaperone present: yes  A:  Well Woman with normal exam  Postmenopausal no HRT ever History of IHSS with coronary ablation and ICD implantation 09/25/07, replacement 08/12/13 S/P coronary ablation with Thoracotomy 02/2011 Repeat coronary ablation 12/15 ( fifth one ) S/P Lap Appendectomy 01/17/13 history of Ovarian complex cyst or hematoma with resolution 9/14  History of colon polyps - tubular adenoma 2012 recheck in 5 yrs  P:   Reviewed health and wellness pertinent to exam  Pap smear as above  Mammogram is due 08/13/16  Will follow with labs  Will refer back to [facility] for a repeat colonoscopy  TDaP is given today  Counseled on breast self exam, mammography screening, adequate intake of calcium and vitamin D, diet and exercise return annually or prn  An After Visit Summary was printed and given to the patient.

## 2016-07-23 ENCOUNTER — Ambulatory Visit (INDEPENDENT_AMBULATORY_CARE_PROVIDER_SITE_OTHER): Payer: BC Managed Care – PPO | Admitting: Nurse Practitioner

## 2016-07-23 ENCOUNTER — Encounter: Payer: Self-pay | Admitting: Nurse Practitioner

## 2016-07-23 VITALS — BP 116/70 | HR 76 | Resp 16 | Ht 66.0 in | Wt 186.0 lb

## 2016-07-23 DIAGNOSIS — Z0000 Encounter for general adult medical examination without abnormal findings: Secondary | ICD-10-CM | POA: Diagnosis not present

## 2016-07-23 DIAGNOSIS — D126 Benign neoplasm of colon, unspecified: Secondary | ICD-10-CM | POA: Diagnosis not present

## 2016-07-23 DIAGNOSIS — Z23 Encounter for immunization: Secondary | ICD-10-CM

## 2016-07-23 DIAGNOSIS — Z01419 Encounter for gynecological examination (general) (routine) without abnormal findings: Secondary | ICD-10-CM

## 2016-07-23 DIAGNOSIS — Z1211 Encounter for screening for malignant neoplasm of colon: Secondary | ICD-10-CM

## 2016-07-23 LAB — COMPREHENSIVE METABOLIC PANEL
ALBUMIN: 4.5 g/dL (ref 3.6–5.1)
ALK PHOS: 91 U/L (ref 33–130)
ALT: 12 U/L (ref 6–29)
AST: 18 U/L (ref 10–35)
BILIRUBIN TOTAL: 0.7 mg/dL (ref 0.2–1.2)
BUN: 14 mg/dL (ref 7–25)
CALCIUM: 9.7 mg/dL (ref 8.6–10.4)
CO2: 27 mmol/L (ref 20–31)
Chloride: 106 mmol/L (ref 98–110)
Creat: 0.91 mg/dL (ref 0.50–1.05)
Glucose, Bld: 88 mg/dL (ref 65–99)
Potassium: 4.1 mmol/L (ref 3.5–5.3)
Sodium: 143 mmol/L (ref 135–146)
TOTAL PROTEIN: 6.8 g/dL (ref 6.1–8.1)

## 2016-07-23 LAB — POCT URINALYSIS DIPSTICK
BILIRUBIN UA: NEGATIVE
Blood, UA: NEGATIVE
Glucose, UA: NEGATIVE
KETONES UA: NEGATIVE
LEUKOCYTES UA: NEGATIVE
NITRITE UA: NEGATIVE
Protein, UA: NEGATIVE
Urobilinogen, UA: NEGATIVE
pH, UA: 5

## 2016-07-23 LAB — LIPID PANEL
CHOL/HDL RATIO: 5 ratio — AB (ref <5.0)
CHOLESTEROL: 176 mg/dL (ref <200)
HDL: 35 mg/dL — ABNORMAL LOW (ref >50)
LDL Cholesterol: 115 mg/dL — ABNORMAL HIGH (ref <100)
Triglycerides: 130 mg/dL (ref <150)
VLDL: 26 mg/dL (ref <30)

## 2016-07-23 LAB — TSH: TSH: 2.32 mIU/L

## 2016-07-23 NOTE — Patient Instructions (Signed)

## 2016-07-24 LAB — CBC
HCT: 41.4 % (ref 35.0–45.0)
Hemoglobin: 13.2 g/dL (ref 11.7–15.5)
MCH: 29.1 pg (ref 27.0–33.0)
MCHC: 31.9 g/dL — ABNORMAL LOW (ref 32.0–36.0)
MCV: 91.4 fL (ref 80.0–100.0)
MPV: 10.2 fL (ref 7.5–12.5)
Platelets: 289 10*3/uL (ref 140–400)
RBC: 4.53 MIL/uL (ref 3.80–5.10)
RDW: 13.8 % (ref 11.0–15.0)
WBC: 7.2 10*3/uL (ref 3.8–10.8)

## 2016-07-24 LAB — VITAMIN D 25 HYDROXY (VIT D DEFICIENCY, FRACTURES): Vit D, 25-Hydroxy: 31 ng/mL (ref 30–100)

## 2016-07-24 LAB — HEMOGLOBIN A1C
Hgb A1c MFr Bld: 5.8 % — ABNORMAL HIGH (ref <5.7)
MEAN PLASMA GLUCOSE: 120 mg/dL

## 2016-07-24 NOTE — Progress Notes (Signed)
Encounter reviewed by Dr. Shaliah Wann Amundson C. Silva.  

## 2016-07-25 LAB — IPS PAP TEST WITH HPV

## 2016-08-06 ENCOUNTER — Telehealth: Payer: Self-pay | Admitting: Nurse Practitioner

## 2016-08-06 NOTE — Telephone Encounter (Signed)
[facility] calling to report that this patient is scheduled for 09/03/16 at 1:30pm.

## 2016-08-31 ENCOUNTER — Telehealth: Payer: Self-pay | Admitting: Cardiology

## 2016-08-31 NOTE — Telephone Encounter (Signed)
Pt called and stated that she is going to start following up with [facility] for her device care. Informed her that I have released her remote monitoring information and pt can inform [facility] that her [facility] profile is ready to be picked up. Pt verbalized understanding.

## 2016-09-05 ENCOUNTER — Telehealth: Payer: Self-pay | Admitting: Nurse Practitioner

## 2016-09-05 NOTE — Telephone Encounter (Signed)
called pt to schedule past due defib check and she said she was transferring to a doctor in [place].

## 2016-09-06 ENCOUNTER — Encounter: Payer: Self-pay | Admitting: Nurse Practitioner

## 2017-01-04 ENCOUNTER — Telehealth: Payer: Self-pay | Admitting: Obstetrics and Gynecology

## 2017-01-04 NOTE — Telephone Encounter (Signed)
Left message on voicemail to call and reschedule cancelled appointment. Mail letter °

## 2017-04-25 ENCOUNTER — Ambulatory Visit: Payer: BC Managed Care – PPO | Admitting: Internal Medicine

## 2017-04-25 ENCOUNTER — Other Ambulatory Visit: Payer: Self-pay

## 2017-04-25 DIAGNOSIS — J069 Acute upper respiratory infection, unspecified: Secondary | ICD-10-CM | POA: Diagnosis not present

## 2017-04-25 DIAGNOSIS — H832X1 Labyrinthine dysfunction, right ear: Secondary | ICD-10-CM | POA: Diagnosis not present

## 2017-04-25 DIAGNOSIS — J302 Other seasonal allergic rhinitis: Secondary | ICD-10-CM | POA: Diagnosis not present

## 2017-04-25 DIAGNOSIS — H9201 Otalgia, right ear: Secondary | ICD-10-CM

## 2017-04-25 DIAGNOSIS — B9789 Other viral agents as the cause of diseases classified elsewhere: Principal | ICD-10-CM

## 2017-04-25 NOTE — Patient Instructions (Signed)
[name],  I would recommend taking Zyrtec during the day and if you need something to help at night you can try Benadryl. If your ear start getting worse, you start having fevers, chills, shortness of breath then please come back to see [name].

## 2017-04-25 NOTE — Progress Notes (Signed)
CC: Ear pain  HPI:  [name] is a 51 y.o. female with a past medical history listed below here today with complaints of right ear pain.   She reports that 3 days ago she began having left ear pain with associated left sided sore throat. She reports using OTC ear drops in that ear and after 2 days her left ear pain and left sided throat pain resolved. However, she subsequently developed right sided ear pain and right sided throat pain. She reports chronic allergies for which she uses nasal spray. Denies any fever, chills, sinus congestion or pressure. Does note rhinorrhea and cough productive of yellow sputum. No shortness of breath or chest pain.   Past Medical History:  Diagnosis Date  . Abnormal thyroid blood test    Mildly elevated TSH, resolved.  . 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 Anson Fret to evaluate anemia; pathology showed a tubulovillous adenoma with no high grade dysplasia or malignancy identified.    . Anemia   . Anxiety    Acute stress reaction to multiple ICD shocks  . Appendicitis, acute, s/p apendectomy 01/17/2013  . Atrial fibrillation ([place])    S/P radiofrequency ablation by Dr. Adrian Prows at [facility] on 02/09/2008 and again on 03/31/2009; S/P ablation at [facility] by Dr. Westly Pam on 08/10/2010, followed by recurrent atrial flutter/fibrillation.  Cardiac cath done on 02/19/2011 at [facility] showed no significant coronary disease.  Patient underwent Convergent Procedure at [facility] on 03/07/2011.   . Atrial tachycardia [facility])    Status post convergent ablation [facility] October 2012.  She underwent successful catheter ablation of persistent atrial tachycardia at [facility] by Dr. Willis Modena on 05/24/2014.    Marland Kitchen Cholecystitis, acute 07/2001   S/P laparoscopic cholecystectomy, intraoperative cholangiogram, and transcystic common bile duct exploration by Dr. Jackolyn Confer on 07/14/2001  .  Dizziness   . Fibroids   . GERD (gastroesophageal reflux disease)   . GI bleed 05/25/2011  . Hyperglycemia   . HYPERGLYCEMIA 12/24/2008   Qualifier: Diagnosis of  By: Marinda Elk MD, Sonia Side    . 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   . Pelvic mass    Pelvic ultrasound done on 11/30/10 to evaluate abdominal pain showed,  positioned between the ovaries and contiguous with the ovaries, a complex mass demonstrating posterior acoustical enhancement and no definite intralesional flow with color Doppler exam measuring 5.2 x 3.1 x 5.5 cm. The appearance raised the possibility of a ruptured ovarian cyst with subsequent development of a hematoma.   . Seasonal allergic rhinitis   . Ventricular tachycardia ([place])    Hx of nonsustained ventricular tachycardia, S/P hospitalization in March 2009; S/P ICD placement by Dr. Deboraha Sprang on 09/25/2007.   Review of Systems:   Negative except as noted in HPI  Physical Exam:  Vitals:   04/25/17 1108  BP: 109/65  Pulse: 91  Temp: 98.4 F (36.9 C)  TempSrc: Oral  SpO2: 96%  Weight: 188 lb 11.2 oz (85.6 kg)  Height: 5\' 5"  (1.651 m)   Physical Exam  Constitutional: No distress.  HENT:  Head: Normocephalic and atraumatic.  Right  Ear: Hearing and tympanic membrane normal. There is tenderness. Tympanic membrane is not injected, not perforated, not erythematous, not retracted and not bulging.  Left Ear: Hearing, tympanic membrane, external ear and ear canal normal.  Nose: Nose normal.  Right ear canal erythematous. Scant right tonsillar exudate and erythema.   Eyes: Conjunctivae and EOM are normal. Pupils are equal, round, and reactive to light.  Right eye exhibits no discharge. Left eye exhibits no discharge. No scleral icterus.  Neck: Normal range of motion. Neck supple.  Cardiovascular: Normal rate, regular rhythm and normal heart sounds.  Pulmonary/Chest: Effort normal and breath sounds normal. No respiratory distress.  Abdominal: Soft. Bowel sounds are normal. There is no tenderness.  Lymphadenopathy:    She has no cervical adenopathy.  Skin: Skin is warm and dry. No rash noted.  Psychiatric: Mood and affect normal.    Assessment & Plan:   See Encounters Tab for problem based charting.  Patient seen with Dr. Rebeca Alert

## 2017-04-26 NOTE — Assessment & Plan Note (Signed)
Patient with viral URI and right sided eustachian tube dysfunction. Will treat with conservative therapies. Return precautions given.

## 2017-05-06 NOTE — Progress Notes (Signed)
Internal Medicine Clinic Attending  I saw and evaluated the patient.  I personally confirmed the key portions of the history and exam documented by Dr. Charlynn Grimes and I reviewed pertinent patient test results.  The assessment, diagnosis, and plan were formulated together and I agree with the documentation in the resident's note.  Oda Kilts, MD

## 2017-07-29 ENCOUNTER — Ambulatory Visit: Payer: BC Managed Care – PPO | Admitting: Nurse Practitioner

## 2018-03-13 ENCOUNTER — Encounter: Payer: Self-pay | Admitting: Internal Medicine

## 2018-03-13 ENCOUNTER — Ambulatory Visit: Payer: BC Managed Care – PPO | Admitting: Internal Medicine

## 2018-03-13 VITALS — BP 99/65 | HR 60 | Temp 98.2°F | Ht 65.0 in | Wt 187.1 lb

## 2018-03-13 DIAGNOSIS — F419 Anxiety disorder, unspecified: Secondary | ICD-10-CM

## 2018-03-13 DIAGNOSIS — I4891 Unspecified atrial fibrillation: Secondary | ICD-10-CM

## 2018-03-13 DIAGNOSIS — I5042 Chronic combined systolic (congestive) and diastolic (congestive) heart failure: Secondary | ICD-10-CM

## 2018-03-13 DIAGNOSIS — D509 Iron deficiency anemia, unspecified: Secondary | ICD-10-CM

## 2018-03-13 DIAGNOSIS — Z79899 Other long term (current) drug therapy: Secondary | ICD-10-CM

## 2018-03-13 DIAGNOSIS — R5383 Other fatigue: Secondary | ICD-10-CM | POA: Diagnosis not present

## 2018-03-13 DIAGNOSIS — I421 Obstructive hypertrophic cardiomyopathy: Secondary | ICD-10-CM

## 2018-03-13 DIAGNOSIS — Z1239 Encounter for other screening for malignant neoplasm of breast: Secondary | ICD-10-CM

## 2018-03-13 DIAGNOSIS — R739 Hyperglycemia, unspecified: Secondary | ICD-10-CM

## 2018-03-13 DIAGNOSIS — I480 Paroxysmal atrial fibrillation: Secondary | ICD-10-CM

## 2018-03-13 DIAGNOSIS — R7303 Prediabetes: Secondary | ICD-10-CM | POA: Diagnosis not present

## 2018-03-13 DIAGNOSIS — G479 Sleep disorder, unspecified: Secondary | ICD-10-CM | POA: Diagnosis not present

## 2018-03-13 DIAGNOSIS — J302 Other seasonal allergic rhinitis: Secondary | ICD-10-CM

## 2018-03-13 DIAGNOSIS — Z8349 Family history of other endocrine, nutritional and metabolic diseases: Secondary | ICD-10-CM | POA: Diagnosis not present

## 2018-03-13 DIAGNOSIS — I472 Ventricular tachycardia: Secondary | ICD-10-CM

## 2018-03-13 DIAGNOSIS — Z0000 Encounter for general adult medical examination without abnormal findings: Secondary | ICD-10-CM | POA: Insufficient documentation

## 2018-03-13 DIAGNOSIS — J301 Allergic rhinitis due to pollen: Secondary | ICD-10-CM

## 2018-03-13 DIAGNOSIS — Z95810 Presence of automatic (implantable) cardiac defibrillator: Secondary | ICD-10-CM

## 2018-03-13 DIAGNOSIS — I471 Supraventricular tachycardia: Secondary | ICD-10-CM

## 2018-03-13 DIAGNOSIS — I4729 Other ventricular tachycardia: Secondary | ICD-10-CM

## 2018-03-13 LAB — POCT GLYCOSYLATED HEMOGLOBIN (HGB A1C): HEMOGLOBIN A1C: 5.8 % — AB (ref 4.0–5.6)

## 2018-03-13 LAB — GLUCOSE, CAPILLARY: Glucose-Capillary: 86 mg/dL (ref 70–99)

## 2018-03-13 NOTE — Patient Instructions (Signed)
1. I will MyChart you your thyroid results 2. See me in one year

## 2018-03-13 NOTE — Assessment & Plan Note (Signed)
This problem is chronic and stable.  She uses Claritin and a nasal corticosteroid as needed for symptoms.  PLAN:  Cont current meds

## 2018-03-13 NOTE — Assessment & Plan Note (Signed)
See A-fib ?

## 2018-03-13 NOTE — Assessment & Plan Note (Signed)
This problem is chronic and stable.  Her most recent hemoglobin and epic was 13.2 in February 2018.  Her MCV was normal as was her RDW.  Her ferritin was 66 in 2016.  She takes no iron supplementation.  Her cardiologist has gotten more recent blood work but the results are not available.  PLAN:  Cont to follow

## 2018-03-13 NOTE — Progress Notes (Signed)
   Subjective:    Patient ID: [name], female    DOB: 01/24/1966, 52 y.o.   MRN: [id number]  HPI  [name] is here for pre-diabetes F/U. Please see the A&P for the status of the pt's chronic medical problems.  ROS : per ROS section and in problem oriented charting. All other systems are negative.  PMHx, Soc hx, and / or Fam hx : older sister dx recently with thyroid dz, mother and maternal grandmother had thyroid dz   Review of Systems  Constitutional: Positive for fatigue.  Cardiovascular: Negative for leg swelling.  Gastrointestinal: Negative for abdominal pain.  Psychiatric/Behavioral: Positive for sleep disturbance.       Objective:   Physical Exam  Constitutional: She appears well-developed and well-nourished. No distress.  HENT:  Head: Normocephalic and atraumatic.  Right Ear: External ear normal.  Left Ear: External ear normal.  Nose: Nose normal.  Eyes: Conjunctivae and EOM are normal. Right eye exhibits no discharge. Left eye exhibits no discharge. No scleral icterus.  Cardiovascular: Normal rate, regular rhythm and normal heart sounds.  No murmur heard. Pulmonary/Chest: Effort normal and breath sounds normal.  Musculoskeletal: Normal range of motion. She exhibits no tenderness or deformity.  Traced edema RLE, none LLE  Neurological: She is alert.  Skin: Skin is warm and dry. She is not diaphoretic.  Psychiatric: She has a normal mood and affect. Her behavior is normal. Judgment and thought content normal.      Assessment & Plan:

## 2018-03-13 NOTE — Assessment & Plan Note (Signed)
I reviewed her cardiac history and updated her medication list.  She has HOCM, atrial tachycardia, ventricular tach status post ICD, atrial fib status post ablation x5 and a convergent procedure successfully kept her in sinus rhythm.  She also has a history of combined diastolic and systolic heart failure.  Her medications are Lasix 20 daily, Eliquis, Tikosyn, and metoprolol 50 mg nightly.  She sees her cardiologist every 6 months.  PLAN : Follow cardiology's notes

## 2018-03-13 NOTE — Assessment & Plan Note (Signed)
This problem is chronic and stable. Takes lasix 20 PO QD. Her cardiologist Rx's and checks blood work.  PLAN:  Cont current meds

## 2018-03-13 NOTE — Assessment & Plan Note (Signed)
This problem is chronic and stable. Her last A1C was 5.8 and today is 5.8. No sxs. The highest A1C ever was 6. Weight stable.  PLAN: A1C yrly

## 2018-03-13 NOTE — Assessment & Plan Note (Signed)
This problem is chronic and stable. Takes Zoloft daily, prescribed by [employer]. No longer in counseling - last was 2011. Tried to come off the Zoloft and co-workers noticed increased OCD tendencies and asked her what had changed. She resumed the zoloft.   PLAN:  Cont current meds

## 2018-03-13 NOTE — Assessment & Plan Note (Signed)
Her most recent A1c was 5.8 and is 5.8 today.  This puts her just within the range for prediabetes.  Her weight is stable.  I discussed that we would simply recheck her A1c yearly.

## 2018-03-14 LAB — TSH: TSH: 2.32 u[IU]/mL (ref 0.450–4.500)

## 2018-04-21 ENCOUNTER — Other Ambulatory Visit: Payer: Self-pay

## 2018-04-21 ENCOUNTER — Telehealth: Payer: Self-pay | Admitting: Internal Medicine

## 2018-04-21 ENCOUNTER — Ambulatory Visit: Payer: BC Managed Care – PPO | Admitting: Internal Medicine

## 2018-04-21 VITALS — BP 130/80 | HR 59 | Temp 98.8°F | Wt 181.4 lb

## 2018-04-21 DIAGNOSIS — D509 Iron deficiency anemia, unspecified: Secondary | ICD-10-CM

## 2018-04-21 DIAGNOSIS — M436 Torticollis: Secondary | ICD-10-CM

## 2018-04-21 DIAGNOSIS — K921 Melena: Secondary | ICD-10-CM

## 2018-04-21 DIAGNOSIS — R778 Other specified abnormalities of plasma proteins: Secondary | ICD-10-CM

## 2018-04-21 DIAGNOSIS — R112 Nausea with vomiting, unspecified: Secondary | ICD-10-CM | POA: Diagnosis not present

## 2018-04-21 DIAGNOSIS — R197 Diarrhea, unspecified: Secondary | ICD-10-CM

## 2018-04-21 DIAGNOSIS — I4891 Unspecified atrial fibrillation: Secondary | ICD-10-CM

## 2018-04-21 DIAGNOSIS — R7989 Other specified abnormal findings of blood chemistry: Principal | ICD-10-CM

## 2018-04-21 DIAGNOSIS — R42 Dizziness and giddiness: Secondary | ICD-10-CM

## 2018-04-21 DIAGNOSIS — R110 Nausea: Secondary | ICD-10-CM | POA: Insufficient documentation

## 2018-04-21 DIAGNOSIS — R51 Headache: Secondary | ICD-10-CM

## 2018-04-21 DIAGNOSIS — G479 Sleep disorder, unspecified: Secondary | ICD-10-CM

## 2018-04-21 DIAGNOSIS — M542 Cervicalgia: Secondary | ICD-10-CM

## 2018-04-21 LAB — COMPREHENSIVE METABOLIC PANEL
ALT: 19 U/L (ref 0–44)
ANION GAP: 8 (ref 5–15)
AST: 23 U/L (ref 15–41)
Albumin: 4.4 g/dL (ref 3.5–5.0)
Alkaline Phosphatase: 76 U/L (ref 38–126)
BILIRUBIN TOTAL: 1.5 mg/dL — AB (ref 0.3–1.2)
BUN: 11 mg/dL (ref 6–20)
CO2: 25 mmol/L (ref 22–32)
Calcium: 9.6 mg/dL (ref 8.9–10.3)
Chloride: 107 mmol/L (ref 98–111)
Creatinine, Ser: 0.79 mg/dL (ref 0.44–1.00)
GFR calc non Af Amer: >60 mL/min (ref >60)
Glucose, Bld: 92 mg/dL (ref 70–99)
Potassium: 3.6 mmol/L (ref 3.5–5.1)
Sodium: 140 mmol/L (ref 135–145)
TOTAL PROTEIN: 7.1 g/dL (ref 6.5–8.1)

## 2018-04-21 LAB — CBC
HCT: 41.9 % (ref 36.0–46.0)
HEMOGLOBIN: 12.8 g/dL (ref 12.0–15.0)
MCH: 28.3 pg (ref 26.0–34.0)
MCHC: 30.5 g/dL (ref 30.0–36.0)
MCV: 92.7 fL (ref 80.0–100.0)
NRBC: 0 % (ref 0.0–0.2)
Platelets: 239 10*3/uL (ref 150–400)
RBC: 4.52 MIL/uL (ref 3.87–5.11)
RDW: 12.9 % (ref 11.5–15.5)
WBC: 7 10*3/uL (ref 4.0–10.5)

## 2018-04-21 LAB — TROPONIN I: TROPONIN I: 0.05 ng/mL — AB (ref <0.03)

## 2018-04-21 LAB — FERRITIN: FERRITIN: 75 ng/mL (ref 11–307)

## 2018-04-21 LAB — LIPASE, BLOOD: LIPASE: 31 U/L (ref 11–51)

## 2018-04-21 MED ORDER — PROMETHAZINE HCL 25 MG/ML IJ SOLN
25.0000 mg | Freq: Once | INTRAMUSCULAR | Status: AC
  Administered 2018-04-21: 25 mg via INTRAMUSCULAR

## 2018-04-21 MED ORDER — MECLIZINE HCL 12.5 MG PO TABS: 12.5000 mg | ORAL_TABLET | Freq: Three times a day (TID) | ORAL | 0 refills | Status: DC | PRN

## 2018-04-21 MED ORDER — PROMETHAZINE HCL 25 MG PO TABS: 25.0000 mg | ORAL_TABLET | Freq: Four times a day (QID) | ORAL | 0 refills | Status: DC | PRN

## 2018-04-21 NOTE — Patient Instructions (Signed)
I will call you with the lab results Let me know if no better in 2-3 days

## 2018-04-21 NOTE — Progress Notes (Signed)
   Subjective:    Patient ID: [name], female    DOB: 1966/05/24, 52 y.o.   MRN: [id number]  HPI  [name] is here for N/V/dizziness. Please see the A&P for the status of the pt's chronic medical problems.  ROS : per ROS section and in problem oriented charting. All other systems are negative.  PMHx, Soc hx, and / or Fam hx : Has h/o HOCM, A tach, V tach, A Fib, s/p d fib. Last seen by [facility] at [facility] in March.  Review of Systems  Constitutional: Positive for activity change, appetite change and chills. Negative for fever.  HENT: Positive for sore throat. Negative for congestion, rhinorrhea, sinus pressure, sinus pain and sneezing.   Eyes: Negative for itching.  Respiratory: Negative for cough.   Cardiovascular: Negative for palpitations and leg swelling.  Gastrointestinal: Positive for abdominal pain, blood in stool, diarrhea, nausea and vomiting.  Genitourinary: Negative for dysuria.  Musculoskeletal: Positive for neck pain and neck stiffness. Negative for myalgias.  Neurological: Positive for dizziness and headaches. Negative for weakness.  Psychiatric/Behavioral: Positive for sleep disturbance.       Objective:   Physical Exam  Constitutional: She appears well-developed and well-nourished. She appears distressed.  Appears uncomfortable  HENT:  Head: Normocephalic and atraumatic.  Nose: Nose normal.  Mouth/Throat: No oropharyngeal exudate.  Mucous membranes moist, erythematous post pharynx   Eyes: Conjunctivae and EOM are normal. Right eye exhibits no discharge. Left eye exhibits no discharge. No scleral icterus.  No lateral nystagmus   Cardiovascular: Normal rate, regular rhythm and normal heart sounds.  Pulmonary/Chest: Effort normal and breath sounds normal. No respiratory distress.  Abdominal: Soft. Bowel sounds are normal.  Musculoskeletal: She exhibits no edema, tenderness or deformity.  Neurological: She is alert.  Skin: Skin is warm and dry. She is not  diaphoretic. No pallor.  Psychiatric: She has a normal mood and affect. Her behavior is normal. Judgment and thought content normal.      Assessment & Plan:

## 2018-04-21 NOTE — Assessment & Plan Note (Signed)
This is a new problem.  She had been working nights and returned home Wednesday the 6th at about 7 AM and went to bed.  She got up about 3 PM and had to drive to get her car serviced.  While driving, she experienced nausea and when she returned home, she took a Phenergan and went to bed.  Also that day, she experienced diarrhea and had to call in sick and did not work the night shift on the 6th.  The next day, Thursday the 7th, she woke up about 8 AM and was not able to eat due to ongoing nausea.  She worked a few hours in the afternoon and returned home about 3:30 PM and was able to eat a cheese sandwich.  When she was walking, she felt a little dizzy or off balance.  The diarrhea did not occur this day and she did return to work that evening to work the night shift.  Friday morning, the 8th, she left work about 6:30 in the morning and had to drive from [employer] to [place] to get her other car serviced.  She was at the car shop for about an hour and while she was waiting she had 2 donuts and a milk.  Before returning home, she went to [facility] and when she got home at 9:30 that morning she went to bed and slept until 3:30 PM.  Since she did not have to work that night shift, she was planning to go out to dinner with friends and later to bingo at the [place].  At dinner, she had a barbecue hamburger with bacon and onion, a few tater tots, a few chips and carrots with ranch.  She was unable to eat all of her hamburger due to nausea.  She had one beer with dinner.  She then drove to the [facility].  Before the Bingo started, she had one bourbon and ginger and then played 3 bingo games.  There was a long intermission and she had one bourbon and ginger, a single sip of a fireball, and a partial bourbon and ginger.  While driving home, she developed violent vomiting and was unable to stop vomiting.  It then turned into a dry heaves.  Much of what happened afterwards is sketchy because she cannot remember everything  completely.  She knows the police were called as was EMS.  She knows EMS helped her change her clothes which were covered in vomit and gave her a paper scrubs to wear.  She felt cold and shaky and continued to have dry heaves.  She states she may have had urinary incontinence while vomiting but it was not clear.  She does not think there was any blood in her vomit as her nephew who cleaned her car did not mention anything about blood.  On the 9th after waking up, she was only able to tolerate a few sips of ginger ale and a half a piece of toast that evening.  She was not able to eat anything else due to ongoing nausea.  On Sunday the 10th, she had a half a piece of toast for breakfast and 2 spoonfuls of yogurt for lunch and both of those resulted in diarrhea.  She noted dark stools on one occasion and her sister observed a second episode with a dark stool intermixed with normal brown stool.  That evening, she was able to keep 2 spoonfuls of rice in her stomach but then had diarrhea this morning, Monday the 11th.  Her stools today have been floating liquidy brown and yellow. Other symptoms include soreness in the left upper abdominal quadrant and epigastric regions.  A scratchy throat. HA today. A sore posterior neck. She has had no vomiting since Friday.  She continues to have anorexia and nausea.  She still feels dizzy describing it like being on a boat.  Any movement including riding in a car makes it worse.  Sitting still makes the dizziness better.  She has a history of vertigo.  The first time was in 1990 or 1991 and resulted in her being out of work for several days.  That episode felt like the bed was spinning and that she was unsteady on her feet.  A second episode occurred after walking on the beach and did cause nausea, diarrhea, vomiting, and anorexia.  There was also a third episode that was also wind related.  When I mentioned the name Antivert, she recognized that and thinks she took Antivert for  the vertigo.  She has been taking all of her medications except her diuretic due to the decreased oral intake.  She has not had any dose changes to any of her medicines.  She saw her cardiologist in April of this year and essentially was given a good checkup.  She does not know if her Delilah Shan is working but has not felt any arrhythmias.  Her father had a CABG x5 when he was 63 years old.  No other finger members have had atherosclerotic heart disease.  She denies any head injury, trauma, dizziness with standing, or focal weakness.  She denies any recent antibiotic use.  ASSESSMENT : Symptom complex of nausea, vomiting, diarrhea, and dizziness.  My differential would include viral acute gastroenteritis (self resolving), posterior circulation stroke (not embolic as on DOAC and low risk for atherosclerotic), UGI bleed (unlikely as HgB returned nl), vertigo / Vestibular neuritis (no nystagmus), pancreatitis (with vol depletion causing dizziness), AMI (unlikely).  We discussed inpatient versus outpatient treatment and came to a consensus that we would attempt outpatient treatment.  An acute illness with dehydration would result in an unexpected and unusual response to a typical alcohol intake, a response that could not be anticipated.  PLAN : CBC, CMP, lipase, ferritin, Trop I Meclizine and Phenergan as needed If caused by a benign self-limited condition, I would expect improvement over the next 24 - 72 hrs If no improvement, we discussed brain imaging and outpatient vestibular rehab.   I will call her with the results of her blood work Midwife

## 2018-04-21 NOTE — Telephone Encounter (Signed)
Patient was informed her Troponin came back elevated at 0.05 and we recommended that she come to the ED to have troponin's trended. She denied any current SOB, chest pain, or arm pain. She said she felt tired and just took her phenergan and wanted to sleep. She agreed to come to the ED if she developed any symptoms overnight but would otherwise come to the clinic in the morning to have her troponin rechecked. She said if she went to the ED it would cost her $200 and she preferred to return to the clinic first. She was informed if she developed any symptoms overnight to please come to the ED. Patient's sister was informed as well.

## 2018-04-22 ENCOUNTER — Ambulatory Visit (HOSPITAL_COMMUNITY)
Admission: RE | Admit: 2018-04-22 | Discharge: 2018-04-22 | Disposition: A | Discharge status: Home / Self Care | Payer: BC Managed Care – PPO | Source: Ambulatory Visit | Attending: Internal Medicine | Admitting: Internal Medicine

## 2018-04-22 ENCOUNTER — Ambulatory Visit (INDEPENDENT_AMBULATORY_CARE_PROVIDER_SITE_OTHER): Payer: BC Managed Care – PPO | Admitting: Internal Medicine

## 2018-04-22 VITALS — BP 118/73 | HR 66

## 2018-04-22 DIAGNOSIS — R110 Nausea: Secondary | ICD-10-CM | POA: Diagnosis not present

## 2018-04-22 DIAGNOSIS — I421 Obstructive hypertrophic cardiomyopathy: Secondary | ICD-10-CM | POA: Insufficient documentation

## 2018-04-22 DIAGNOSIS — R778 Other specified abnormalities of plasma proteins: Secondary | ICD-10-CM

## 2018-04-22 DIAGNOSIS — R9431 Abnormal electrocardiogram [ECG] [EKG]: Secondary | ICD-10-CM | POA: Diagnosis not present

## 2018-04-22 DIAGNOSIS — I444 Left anterior fascicular block: Secondary | ICD-10-CM | POA: Diagnosis not present

## 2018-04-22 DIAGNOSIS — R7989 Other specified abnormal findings of blood chemistry: Secondary | ICD-10-CM

## 2018-04-22 LAB — TROPONIN I: TROPONIN I: 0.06 ng/mL — AB (ref <0.03)

## 2018-04-22 NOTE — Progress Notes (Signed)
   Subjective:    Patient ID: [name], female    DOB: [birth year], 52 y.o.   MRN: [id number]  HPI  [name] is here for elevated Trop I. Please see the A&P for the status of the pt's chronic medical problems.  ROS : per ROS section and in problem oriented charting. All other systems are negative.  PMHx, Soc hx, and / or Fam hx : her sister drove her here today. Most recent EKG April 2019 but at [facility] and cannot see tracing.  Review of Systems     Objective:   Physical Exam        Assessment & Plan:

## 2018-04-23 DIAGNOSIS — R7989 Other specified abnormal findings of blood chemistry: Secondary | ICD-10-CM | POA: Insufficient documentation

## 2018-04-23 DIAGNOSIS — R778 Other specified abnormalities of plasma proteins: Secondary | ICD-10-CM | POA: Insufficient documentation

## 2018-04-23 NOTE — Assessment & Plan Note (Signed)
This is a new problem.  Please see documentation under nausea.

## 2018-04-23 NOTE — Assessment & Plan Note (Signed)
This is a follow-up to yesterday's appointment.  I had included ACS on her differential diagnosis for her nausea, vomiting, and dizziness.  My pretest probability of ACS was low and did not warrant an EKG yesterday.  Her troponin I returned last night at 0.05.  The night float resident called Ms Bobst, and informed her of the results, and arrange for an appointment today for a repeat troponin and EKG.  Yesterday at her appointment, she received Phenergan 25 mg IM and that has provided significant relief of her symptoms.  Today was the first day that she was able to ride in a car without significant symptoms.  Her gait is okay and she was able to walk to the clinic independently whereas yesterday, she was transported in a wheelchair.  Her intake still is poor.  She had 2 teaspoons of rice last night and had diarrhea when she woke up today.  The dizziness is resolving and improved compared to yesterday.  She does notice a tight twisting under the left breast which is normally present when she wakes up and has noticed since Saturday.  She wonders if her GI symptoms can be due to a nervous stomach secondary to the stress she is currently under.  I do not want to assume this as the GI symptoms occurred prior to the stress.  I independently reviewed her EKG and compared it to the tracing from February 2017 and the written report from [facility] in April of this year.  It is essentially unchanged.  It is atrial paced, there is a slight left axis deviation, there are inverted T waves in the lateral leads which are unchanged.  There is slight QRS widening and her QRS duration is 124.  Her QTC is 490 which is slightly longer than her QTC in April which was 464.  Her repeat troponin is 0.06.  ASSESSMENT ; I do not feel that her elevated troponin is due to a primary cardiac etiology like ACS.  Her EKG is unchanged and shows no ischemic changes.  She has no classic symptoms and the symptoms she described under her left breast  could be part of the left upper quadrant and epigastric pain that started after the violent vomiting.  I could not find any ischemic work-up in her chart but with a normal EKG, no angina, and a flat troponin I do not feel she needs emergent cardiology work-up.  She is overdue for cardiology follow-up at [facility] and can follow-up noN emergently with her [facility] cardiologist.  My inclination currently is that her symptoms are due to acute vestibular neuritis.  This would require symptomatic treatment only which we are doing with as needed Phenergan and meclizine.  Additionally, we discussed Imodium for diarrhea and Maalox to settle her stomach.  Pepto-Bismol has caused vomiting in the past and she prefers to avoid this medication.  We discussed that Phenergan and meclizine showed be used only as needed for symptoms as they both can be sedating and Phenergan may prolong the QTC.  Since she is having improvement in her symptoms and move the symptoms have not resolved, I am reassured that symptomatic treatment is all that is needed currently.  PLAN : Symptomatic treatment with as needed meclizine, Phenergan, Maalox, and Imodium. Return to clinic, ER, or 911 if symptoms worsen or new symptoms develop.

## 2018-08-19 ENCOUNTER — Encounter: Payer: Self-pay | Admitting: Internal Medicine

## 2018-08-19 ENCOUNTER — Ambulatory Visit: Payer: BC Managed Care – PPO | Admitting: Internal Medicine

## 2018-08-19 ENCOUNTER — Ambulatory Visit (HOSPITAL_COMMUNITY)
Admission: RE | Admit: 2018-08-19 | Discharge: 2018-08-19 | Disposition: A | Discharge status: Home / Self Care | Payer: BC Managed Care – PPO | Source: Ambulatory Visit | Attending: Internal Medicine | Admitting: Internal Medicine

## 2018-08-19 VITALS — BP 107/59 | HR 97 | Temp 99.8°F | Wt 191.3 lb

## 2018-08-19 DIAGNOSIS — R5381 Other malaise: Secondary | ICD-10-CM

## 2018-08-19 DIAGNOSIS — J189 Pneumonia, unspecified organism: Secondary | ICD-10-CM | POA: Insufficient documentation

## 2018-08-19 DIAGNOSIS — R509 Fever, unspecified: Secondary | ICD-10-CM | POA: Diagnosis not present

## 2018-08-19 DIAGNOSIS — R0600 Dyspnea, unspecified: Secondary | ICD-10-CM

## 2018-08-19 DIAGNOSIS — R05 Cough: Secondary | ICD-10-CM

## 2018-08-19 DIAGNOSIS — R0981 Nasal congestion: Secondary | ICD-10-CM

## 2018-08-19 DIAGNOSIS — M791 Myalgia, unspecified site: Secondary | ICD-10-CM

## 2018-08-19 DIAGNOSIS — R093 Abnormal sputum: Secondary | ICD-10-CM

## 2018-08-19 DIAGNOSIS — J40 Bronchitis, not specified as acute or chronic: Secondary | ICD-10-CM | POA: Insufficient documentation

## 2018-08-19 DIAGNOSIS — Z79899 Other long term (current) drug therapy: Secondary | ICD-10-CM

## 2018-08-19 DIAGNOSIS — J3489 Other specified disorders of nose and nasal sinuses: Secondary | ICD-10-CM

## 2018-08-19 DIAGNOSIS — R5383 Other fatigue: Secondary | ICD-10-CM

## 2018-08-19 DIAGNOSIS — R062 Wheezing: Secondary | ICD-10-CM

## 2018-08-19 MED ORDER — AMOXICILLIN 500 MG PO CAPS: 1000.0000 mg | ORAL_CAPSULE | Freq: Three times a day (TID) | ORAL | 0 refills | Status: AC

## 2018-08-19 MED ORDER — AZITHROMYCIN 1 G PO PACK: 1.0000 g | PACK | Freq: Once | ORAL | 0 refills | Status: AC

## 2018-08-19 MED ORDER — DOXYCYCLINE HYCLATE 100 MG PO TABS: 100.0000 mg | ORAL_TABLET | Freq: Two times a day (BID) | ORAL | 0 refills | Status: DC

## 2018-08-19 NOTE — Addendum Note (Signed)
Addended by: Oval Linsey D on: 08/19/2018 07:44 PM   Modules accepted: Level of Service

## 2018-08-19 NOTE — Progress Notes (Signed)
Patient ID: [name], female   DOB: 1966/02/04, 53 y.o.   MRN: [id number]  Case discussed with Dr. Sharon Seller at the time of the visit.  We reviewed the resident's history and exam and pertinent patient test results.  I agree with the assessment, diagnosis and plan of care documented in the resident's note.  The CXR was personally reviewed and revealed no infiltrates, effusions, masses, or interstitial edema.  There was bronchial thickening consistent with bronchitis.  Thus, radiographically she does not have a pneumonia and requires only one antibiotic for a presumed bacterial bronchitis.  No follow-up CXR is required unless symptoms do not improve or worsen.

## 2018-08-19 NOTE — Assessment & Plan Note (Addendum)
About four days ago she started feeling congestion, ears popping, and thought it was her allergies acting about. About two days ago she had increasing myalgias, dyspnea on exertion, and coughing and sneezing productive for yellow/brown phlegm. She states she can hear herself breathe which hasn't happened before. Her temp yesterday was 100.7. On PE she does have bibasilar crackles with mild wheezing on the right. She has no symptoms of fluid overload of acute HF on exam. She has no smoking history. As she has crackles in both bases, will cover for atypicals as well. Additionally will avoid z-pack with her tikosyn and mild elongated QTc as she is at increased risk with this medication.   - amoxicillin 500 mg two tablets tid for seven days , doxycycline 100 MG bid for 7 days  - chest xray  - follow-up if symptoms do not improve in 4 days   ADDENDUM: Chest x-ray shows no infiltrate or consolidation but possible bronchitis. Will decrease antibiotic regimen to amoxicillin alone.

## 2018-08-19 NOTE — Patient Instructions (Addendum)
Thank you for allowing [name] to provide your care today. Today we discussed your symptoms of Pneumonia   Today we made the following changes to your medications:   Please START taking  Doxycycline 100 MG tablet - take one tablet every 12 hours for 7 days   Amoxicillin (AMOXIL) 1000 MG tablet - take two tablets every 8 hours for 7 days  Please take Ibuprofen as needed for fever and muscle aches.   Please STOP taking   Please follow-up if symptoms do not begin to resolve in the next five days.    Should you have any questions or concerns please call the internal medicine clinic at [phone].

## 2018-08-19 NOTE — Progress Notes (Signed)
CC: cough, fever  HPI:  [name] is a 53 y.o. with PMH as below.   Please see A&P for assessment of the patient's acute and chronic medical conditions.   Past Medical History:  Diagnosis Date  . Abnormal thyroid blood test    Mildly elevated TSH, resolved.  . 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 Anson Fret to evaluate anemia; pathology showed a tubulovillous adenoma with no high grade dysplasia or malignancy identified.    . Anemia   . Anxiety    Acute stress reaction to multiple ICD shocks  . Appendicitis, acute, s/p apendectomy 01/17/2013  . Atrial fibrillation ([place])    S/P radiofrequency ablation by Dr. Adrian Prows at [facility] on 02/09/2008 and again on 03/31/2009; S/P ablation at [facility] by Dr. Westly Pam on 08/10/2010, followed by recurrent atrial flutter/fibrillation.  Cardiac cath done on 02/19/2011 at [facility] showed no significant coronary disease.  Patient underwent Convergent Procedure at [facility] on 03/07/2011.   . Atrial tachycardia [facility])    Status post convergent ablation [facility] October 2012.  She underwent successful catheter ablation of persistent atrial tachycardia at [facility] by Dr. Willis Modena on 05/24/2014.    Marland Kitchen Cholecystitis, acute 07/2001   S/P laparoscopic cholecystectomy, intraoperative cholangiogram, and transcystic common bile duct exploration by Dr. Jackolyn Confer on 07/14/2001  . Dizziness   . Fibroids   . GERD (gastroesophageal reflux disease)   . GI bleed 05/25/2011  . Hyperglycemia   . HYPERGLYCEMIA 12/24/2008   Qualifier: Diagnosis of  By: Marinda Elk MD, Sonia Side    . 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   . Pelvic mass    Pelvic ultrasound done on 11/30/10 to evaluate abdominal pain showed,  positioned between the ovaries and contiguous with the ovaries, a complex mass demonstrating posterior acoustical enhancement and no definite intralesional flow with color Doppler exam measuring 5.2 x 3.1 x 5.5 cm. The appearance raised the possibility of a ruptured ovarian cyst with subsequent development of a hematoma.   . Seasonal allergic rhinitis   . Ventricular tachycardia ([place])    Hx of nonsustained ventricular tachycardia, S/P hospitalization in March 2009; S/P ICD placement by Dr. Deboraha Sprang on 09/25/2007.   Review of Systems:   Review of Systems  Constitutional: Positive for chills, fever and malaise/fatigue. Negative for diaphoresis.  HENT: Positive for congestion and sinus pain. Negative for ear pain, nosebleeds and sore throat.   Respiratory: Positive for cough, sputum production, shortness of breath and wheezing. Negative for hemoptysis and stridor.   Cardiovascular: Positive for orthopnea. Negative for chest pain, palpitations and leg swelling.  Gastrointestinal: Negative for abdominal pain, constipation, diarrhea, nausea and vomiting.       +bloating  Musculoskeletal: Positive for myalgias. Negative for back pain.  Neurological: Negative for dizziness and weakness.   Physical Exam:  Constitution: acutely ill appearing, well-nourished, well-dressed  HENT: no sinus tenderness Eyes: no icterus or injection  Cardio: RRR, no m/r/g  Respiratory: bilateral bibasilar crackles, mild wheezing on right Abdominal: NTTP, soft, non-distended  MSK: no pitting edema, moving all extremities  Neuro: a&o, normal affect Skin: c/d/i  Vitals:   08/19/18 1041  BP: (!) 107/59  Pulse: 97  Temp: 99.8 F (37.7 C)  TempSrc: Oral  Weight: 191 lb 4.8 oz (86.8 kg)    Assessment & Plan:   See Encounters Tab for problem based charting.  Patient discussed  with Dr. Eppie Gibson

## 2018-08-21 ENCOUNTER — Other Ambulatory Visit: Payer: Self-pay | Admitting: Internal Medicine

## 2018-08-21 NOTE — Progress Notes (Signed)
Patient called earlier today and is concerned about continuing to have symptoms of abdominal fullness without other new symptoms. She weighed herself this am and was 187. She had no signs of volume overload at appointment on 08/19/2018 but did have increased weight to 191. Her lowest recent weight was 181 on office visit 04/22/2018. As her chest xray did not show consolidation but she did have crackles on exam with cough, I will have her take her extra dose of lasix for the next few days to diurese 5lbs or until symptoms improve. Will follow-up 08/25/2018.   Molli Hazard A, DO 08/21/2018, 3:20 PM Pager: [phone]

## 2019-01-15 ENCOUNTER — Encounter: Payer: Self-pay | Admitting: Certified Nurse Midwife

## 2019-01-22 ENCOUNTER — Other Ambulatory Visit: Payer: Self-pay

## 2019-01-26 ENCOUNTER — Encounter: Payer: Self-pay | Admitting: Certified Nurse Midwife

## 2019-01-26 ENCOUNTER — Other Ambulatory Visit: Payer: Self-pay

## 2019-01-26 ENCOUNTER — Ambulatory Visit: Payer: BC Managed Care – PPO | Admitting: Certified Nurse Midwife

## 2019-01-26 VITALS — BP 110/74 | HR 64 | Temp 97.5°F | Ht 64.5 in | Wt 186.8 lb

## 2019-01-26 DIAGNOSIS — Z01419 Encounter for gynecological examination (general) (routine) without abnormal findings: Secondary | ICD-10-CM | POA: Diagnosis not present

## 2019-01-26 DIAGNOSIS — N898 Other specified noninflammatory disorders of vagina: Secondary | ICD-10-CM

## 2019-01-26 DIAGNOSIS — N951 Menopausal and female climacteric states: Secondary | ICD-10-CM

## 2019-01-26 DIAGNOSIS — Z8679 Personal history of other diseases of the circulatory system: Secondary | ICD-10-CM | POA: Diagnosis not present

## 2019-01-26 NOTE — Progress Notes (Signed)
53 y.o. G0P0000 Significant Other  Caucasian Fe here for annual exam. Sees Dr.Gehi for atrial fibrillation with defibrillator in, continues with follow up every 6 months. Has all her labs done there. Menopausal no HRT. Still symptoms of hot flashes and night sweats.Denies vaginal bleeding or vaginal dryness. Not sexually active now. No insomnia, just difficulty going to sleep at times. Last labs here in 2018. Eating well. Recent mammogram was normal. No other issues today.  Patient's last menstrual period was 10/06/2011.          Sexually active: No.  The current method of family planning is post menopausal status.    Exercising: No.  The patient does not participate in regular exercise at present. Smoker:  no  Review of Systems  Constitutional: Negative.   HENT: Negative.   Eyes: Negative.   Respiratory: Negative.   Cardiovascular: Negative.   Gastrointestinal: Negative.   Genitourinary: Negative.   Musculoskeletal: Negative.   Skin: Negative.   Neurological: Negative.   Endo/Heme/Allergies: Negative.   Psychiatric/Behavioral: Negative.     Health Maintenance: Pap:  07-23-16 neg HPV HR neg History of Abnormal Pap: no MMG:  2 weeks ago per patient at [facility] -- normal  Self Breast exams: yes Colonoscopy:  2018 every 5 polyp BMD:   none TDaP:  2018 Shingles: no Pneumonia: no Hep C and HIV: both neg 2016 Labs: PCP   reports that she has never smoked. She has never used smokeless tobacco. She reports current alcohol use of about 1.0 standard drinks of alcohol per week. She reports that she does not use drugs.  Past Medical History:  Diagnosis Date  . Abnormal thyroid blood test    Mildly elevated TSH, resolved.  . 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 Anson Fret to evaluate anemia; pathology showed a tubulovillous adenoma with no high grade dysplasia or malignancy identified.    . Anemia   . Anxiety     Acute stress reaction to multiple ICD shocks  . Appendicitis, acute, s/p apendectomy 01/17/2013  . Atrial fibrillation ([place])    S/P radiofrequency ablation by Dr. Adrian Prows at [facility] on 02/09/2008 and again on 03/31/2009; S/P ablation at [facility] by Dr. Westly Pam on 08/10/2010, followed by recurrent atrial flutter/fibrillation.  Cardiac cath done on 02/19/2011 at [facility] showed no significant coronary disease.  Patient underwent Convergent Procedure at [facility] on 03/07/2011.   . Atrial tachycardia [facility])    Status post convergent ablation [facility] October 2012.  She underwent successful catheter ablation of persistent atrial tachycardia at [facility] by Dr. Willis Modena on 05/24/2014.    Marland Kitchen Cholecystitis, acute 07/2001   S/P laparoscopic cholecystectomy, intraoperative cholangiogram, and transcystic common bile duct exploration by Dr. Jackolyn Confer on 07/14/2001  . Dizziness   . Fibroids   . GERD (gastroesophageal reflux disease)   . GI bleed 05/25/2011  . Hyperglycemia   . HYPERGLYCEMIA 12/24/2008   Qualifier: Diagnosis of  By: Marinda Elk MD, Sonia Side    . 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   . Pelvic mass    Pelvic ultrasound done on 11/30/10 to evaluate abdominal pain showed,  positioned between  the ovaries and contiguous with the ovaries, a complex mass demonstrating posterior acoustical enhancement and no definite intralesional flow with color Doppler exam measuring 5.2 x 3.1 x 5.5 cm. The appearance raised the possibility of a ruptured ovarian cyst with subsequent development of a hematoma.   . Seasonal allergic rhinitis   . Ventricular tachycardia ([place])    Hx of nonsustained  ventricular tachycardia, S/P hospitalization in March 2009; S/P ICD placement by Dr. Deboraha Sprang on 09/25/2007.    Past Surgical History:  Procedure Laterality Date  . APPENDECTOMY    . CARDIAC DEFIBRILLATOR PLACEMENT  09/25/2007; 08/2013   Placed by Dr. Deboraha Sprang on 09/25/2007; gen change 08/2013 by Dr Caryl Comes (STJ dual chamber ICD)  . Convergent Ablation  03/07/2011   Patient underwent Convergent Procedure at [facility] on 03/07/2011.  Marland Kitchen FLEXIBLE SIGMOIDOSCOPY  05/26/2011   Procedure: FLEXIBLE SIGMOIDOSCOPY;  Surgeon: Lear Ng, MD;  Location: [facility] ENDOSCOPY;  Service: Endoscopy;  Laterality: N/A;  . IMPLANTABLE CARDIOVERTER DEFIBRILLATOR (ICD) GENERATOR CHANGE N/A 08/12/2013   Procedure: ICD GENERATOR CHANGE;  Surgeon: Deboraha Sprang, MD;  Location: [facility] CATH LAB;  Service: Cardiovascular;  Laterality: N/A;  . LAPAROSCOPIC APPENDECTOMY N/A 01/17/2013   Procedure: APPENDECTOMY LAPAROSCOPIC;  Surgeon: Rolm Bookbinder, MD;  Location: [place];  Service: General;  Laterality: N/A;  . LAPAROSCOPIC CHOLECYSTECTOMY  07/14/2001   S/P laparoscopic cholecystectomy, intraoperative cholangiogram, and transcystic common bile duct exploration by Dr. Jackolyn Confer on 07/14/2001  . RADIOFREQUENCY ABLATION  08/10/2010   ablation  done at [facility]  . RADIOFREQUENCY ABLATION  03/2009   ablation at [place]  01/2008   ablation at [place]  05/24/2014   Successful radiofrequency catheter ablation by Dr. Willis Modena at [facility].    Current Outpatient Medications  Medication Sig Dispense Refill  . apixaban (ELIQUIS) 5 MG TABS tablet Take 5 mg by mouth 2 (two) times daily.    Marland Kitchen dofetilide (TIKOSYN) 500 MCG capsule Take 500 mcg by mouth 2 (two) times daily.      . fluticasone (FLONASE) 50 MCG/ACT nasal spray Place 2 sprays into both nostrils daily.    . furosemide (LASIX) 20 MG tablet TAKE 1 TABLET (20 MG TOTAL) BY MOUTH DAILY. Can take additional 20 mg as needed for weight  gain    . loratadine (CLARITIN) 10 MG tablet Take 10 mg by mouth daily as needed.     . metoprolol succinate (TOPROL-XL) 100 MG 24 hr tablet Take 50 mg by mouth at bedtime.     . Omega 3 1000 MG CAPS Take by mouth as needed.    . sertraline (ZOLOFT) 100 MG tablet Take 50 mg by mouth daily.  1   No current facility-administered medications for this visit.     Family History  Problem Relation Age of Onset  . Hypertrophic cardiomyopathy Sister   . Heart disease Sister   . Hypertension Sister   . Throat cancer Father   . Coronary artery disease Father 46       S/P CABG  . Heart disease Father   . Cancer Father        throat  . Colon polyps Mother   . Hypertrophic cardiomyopathy Mother   . Heart disease Mother   . Hyperlipidemia Mother   . Cancer Maternal Aunt        breast  . Heart disease Sister   . Hypertrophic cardiomyopathy Cousin        3 cousins  on mother's side Deceased  . Breast cancer Paternal Aunt   . Hypertrophic cardiomyopathy Maternal Grandmother   . Hypertrophic cardiomyopathy Other        great uncle  . Ovarian cancer Neg Hx        great aunt had  . Colon cancer Neg Hx   . Lung cancer Neg Hx   . Diabetes Neg Hx     ROS:  Pertinent items are noted in HPI.  Otherwise, a comprehensive ROS was negative.  Exam:   BP 110/74 (BP Location: Right Arm, Patient Position: Sitting)   Pulse 64   Temp (!) 97.5 F (36.4 C)   Ht 5' 4.5" (1.638 m)   Wt 186 lb 12.8 oz (84.7 kg)   LMP 10/06/2011   BMI 31.57 kg/m  Height: 5' 4.5" (163.8 cm) Ht Readings from Last 3 Encounters:  01/26/19 5' 4.5" (1.638 m)  03/13/18 5\' 5"  (1.651 m)  04/25/17 5\' 5"  (1.651 m)    General appearance: alert, cooperative and appears stated age Head: Normocephalic, without obvious abnormality, atraumatic Neck: no adenopathy, supple, symmetrical, trachea midline and thyroid normal to inspection and palpation Lungs: clear to auscultation bilaterally Breasts: normal appearance, no masses or  tenderness Heart: regular rate and rhythm Abdomen: soft, non-tender; no masses,  no organomegaly Extremities: extremities normal, atraumatic, no cyanosis or edema Skin: Skin color, texture, turgor normal. No rashes or lesions Lymph nodes: Cervical, supraclavicular, and axillary nodes normal. No abnormal inguinal nodes palpated Neurologic: Grossly normal   Pelvic: External genitalia:  no lesions              Urethra:  normal appearing urethra with no masses, tenderness or lesions              Bartholin's and Skene's: normal                 Vagina: normal appearing vagina with normal color and discharge, no lesions              Cervix: no cervical motion tenderness, no lesions and normal appearance              Pap taken: No. Bimanual Exam:  Uterus:  normal size, contour, position, consistency, mobility, non-tender and anteverted              Adnexa: normal adnexa and no mass, fullness, tenderness               Rectovaginal: Confirms               Anus:  normal sphincter tone, no lesions  Chaperone present: yes  A:  Well Woman with normal exam  Menopausal no HRT  Vaginal dryness  Atrial Fibrillation with cardiology management, with labs.  P:   Reviewed health and wellness pertinent to exam  Aware of need to advise if vaginal bleeding  Discussed coconut or Olive oil use for vaginal dryness. Will advise if no change  Continue follow up with PCP as indicated.  Pap smear: no  Reviewed need for annual exam and mammogram, SBE, regular exercise and good diet, colonoscopy when due.   return annually or prn  An After Visit Summary was printed and given to the patient.

## 2019-01-26 NOTE — Patient Instructions (Signed)

## 2019-03-23 ENCOUNTER — Other Ambulatory Visit: Payer: Self-pay

## 2019-03-23 DIAGNOSIS — Z20822 Contact with and (suspected) exposure to covid-19: Secondary | ICD-10-CM

## 2019-03-24 LAB — NOVEL CORONAVIRUS, NAA: SARS-CoV-2, NAA: NOT DETECTED

## 2019-04-10 ENCOUNTER — Encounter (HOSPITAL_BASED_OUTPATIENT_CLINIC_OR_DEPARTMENT_OTHER): Payer: Self-pay | Admitting: *Deleted

## 2019-04-10 ENCOUNTER — Telehealth: Payer: Self-pay | Admitting: Internal Medicine

## 2019-04-10 ENCOUNTER — Inpatient Hospital Stay (HOSPITAL_BASED_OUTPATIENT_CLINIC_OR_DEPARTMENT_OTHER)
Admission: EM | Admit: 2019-04-10 | Discharge: 2019-04-13 | DRG: 372 | Disposition: A | Discharge status: Home / Self Care | Payer: BC Managed Care – PPO | Attending: Internal Medicine | Admitting: Internal Medicine

## 2019-04-10 ENCOUNTER — Emergency Department (HOSPITAL_BASED_OUTPATIENT_CLINIC_OR_DEPARTMENT_OTHER): Payer: BC Managed Care – PPO

## 2019-04-10 ENCOUNTER — Other Ambulatory Visit: Payer: Self-pay

## 2019-04-10 DIAGNOSIS — Z8679 Personal history of other diseases of the circulatory system: Secondary | ICD-10-CM | POA: Diagnosis not present

## 2019-04-10 DIAGNOSIS — Z803 Family history of malignant neoplasm of breast: Secondary | ICD-10-CM | POA: Diagnosis not present

## 2019-04-10 DIAGNOSIS — Z7951 Long term (current) use of inhaled steroids: Secondary | ICD-10-CM | POA: Diagnosis not present

## 2019-04-10 DIAGNOSIS — K529 Noninfective gastroenteritis and colitis, unspecified: Secondary | ICD-10-CM

## 2019-04-10 DIAGNOSIS — D649 Anemia, unspecified: Secondary | ICD-10-CM | POA: Diagnosis present

## 2019-04-10 DIAGNOSIS — Z8249 Family history of ischemic heart disease and other diseases of the circulatory system: Secondary | ICD-10-CM

## 2019-04-10 DIAGNOSIS — Z8719 Personal history of other diseases of the digestive system: Secondary | ICD-10-CM | POA: Diagnosis not present

## 2019-04-10 DIAGNOSIS — R109 Unspecified abdominal pain: Secondary | ICD-10-CM | POA: Diagnosis present

## 2019-04-10 DIAGNOSIS — K651 Peritoneal abscess: Secondary | ICD-10-CM | POA: Diagnosis present

## 2019-04-10 DIAGNOSIS — Z8371 Family history of colonic polyps: Secondary | ICD-10-CM | POA: Diagnosis not present

## 2019-04-10 DIAGNOSIS — I422 Other hypertrophic cardiomyopathy: Secondary | ICD-10-CM | POA: Diagnosis not present

## 2019-04-10 DIAGNOSIS — I421 Obstructive hypertrophic cardiomyopathy: Secondary | ICD-10-CM | POA: Diagnosis present

## 2019-04-10 DIAGNOSIS — K219 Gastro-esophageal reflux disease without esophagitis: Secondary | ICD-10-CM | POA: Diagnosis present

## 2019-04-10 DIAGNOSIS — K7689 Other specified diseases of liver: Secondary | ICD-10-CM | POA: Diagnosis present

## 2019-04-10 DIAGNOSIS — Z95810 Presence of automatic (implantable) cardiac defibrillator: Secondary | ICD-10-CM

## 2019-04-10 DIAGNOSIS — Z79899 Other long term (current) drug therapy: Secondary | ICD-10-CM | POA: Diagnosis not present

## 2019-04-10 DIAGNOSIS — I5042 Chronic combined systolic (congestive) and diastolic (congestive) heart failure: Secondary | ICD-10-CM | POA: Diagnosis present

## 2019-04-10 DIAGNOSIS — Z7901 Long term (current) use of anticoagulants: Secondary | ICD-10-CM

## 2019-04-10 DIAGNOSIS — Z808 Family history of malignant neoplasm of other organs or systems: Secondary | ICD-10-CM | POA: Diagnosis not present

## 2019-04-10 DIAGNOSIS — I4891 Unspecified atrial fibrillation: Secondary | ICD-10-CM | POA: Diagnosis present

## 2019-04-10 DIAGNOSIS — Z20828 Contact with and (suspected) exposure to other viral communicable diseases: Secondary | ICD-10-CM | POA: Diagnosis present

## 2019-04-10 DIAGNOSIS — Z8349 Family history of other endocrine, nutritional and metabolic diseases: Secondary | ICD-10-CM

## 2019-04-10 DIAGNOSIS — Z9889 Other specified postprocedural states: Secondary | ICD-10-CM | POA: Diagnosis not present

## 2019-04-10 DIAGNOSIS — Z9049 Acquired absence of other specified parts of digestive tract: Secondary | ICD-10-CM

## 2019-04-10 DIAGNOSIS — E876 Hypokalemia: Secondary | ICD-10-CM | POA: Diagnosis present

## 2019-04-10 LAB — CBC WITH DIFFERENTIAL/PLATELET
Abs Immature Granulocytes: 0.05 10*3/uL (ref 0.00–0.07)
Basophils Absolute: 0 10*3/uL (ref 0.0–0.1)
Basophils Relative: 0 %
Eosinophils Absolute: 0 10*3/uL (ref 0.0–0.5)
Eosinophils Relative: 0 %
HCT: 40 % (ref 36.0–46.0)
Hemoglobin: 12.4 g/dL (ref 12.0–15.0)
Immature Granulocytes: 0 %
Lymphocytes Relative: 7 %
Lymphs Abs: 0.8 10*3/uL (ref 0.7–4.0)
MCH: 28.8 pg (ref 26.0–34.0)
MCHC: 31 g/dL (ref 30.0–36.0)
MCV: 92.8 fL (ref 80.0–100.0)
Monocytes Absolute: 1.2 10*3/uL — ABNORMAL HIGH (ref 0.1–1.0)
Monocytes Relative: 10 %
Neutro Abs: 9.6 10*3/uL — ABNORMAL HIGH (ref 1.7–7.7)
Neutrophils Relative %: 83 %
Platelets: 225 10*3/uL (ref 150–400)
RBC: 4.31 MIL/uL (ref 3.87–5.11)
RDW: 13.3 % (ref 11.5–15.5)
WBC: 11.6 10*3/uL — ABNORMAL HIGH (ref 4.0–10.5)
nRBC: 0 % (ref 0.0–0.2)

## 2019-04-10 LAB — URINALYSIS, ROUTINE W REFLEX MICROSCOPIC
Bilirubin Urine: NEGATIVE
Glucose, UA: NEGATIVE mg/dL
Ketones, ur: 15 mg/dL — AB
Leukocytes,Ua: NEGATIVE
Nitrite: NEGATIVE
Protein, ur: NEGATIVE mg/dL
Specific Gravity, Urine: 1.02 (ref 1.005–1.030)
pH: 7 (ref 5.0–8.0)

## 2019-04-10 LAB — URINALYSIS, MICROSCOPIC (REFLEX)

## 2019-04-10 LAB — COMPREHENSIVE METABOLIC PANEL
ALT: 13 U/L (ref 0–44)
AST: 19 U/L (ref 15–41)
Albumin: 4.2 g/dL (ref 3.5–5.0)
Alkaline Phosphatase: 76 U/L (ref 38–126)
Anion gap: 11 (ref 5–15)
BUN: 11 mg/dL (ref 6–20)
CO2: 24 mmol/L (ref 22–32)
Calcium: 9.2 mg/dL (ref 8.9–10.3)
Chloride: 104 mmol/L (ref 98–111)
Creatinine, Ser: 0.99 mg/dL (ref 0.44–1.00)
GFR calc Af Amer: >60 mL/min (ref >60)
GFR calc non Af Amer: >60 mL/min (ref >60)
Glucose, Bld: 113 mg/dL — ABNORMAL HIGH (ref 70–99)
Potassium: 4.2 mmol/L (ref 3.5–5.1)
Sodium: 139 mmol/L (ref 135–145)
Total Bilirubin: 2.2 mg/dL — ABNORMAL HIGH (ref 0.3–1.2)
Total Protein: 7.2 g/dL (ref 6.5–8.1)

## 2019-04-10 LAB — SARS CORONAVIRUS 2 BY RT PCR (HOSPITAL ORDER, PERFORMED IN ~~LOC~~ HOSPITAL LAB): SARS Coronavirus 2: NEGATIVE

## 2019-04-10 LAB — LIPASE, BLOOD: Lipase: 22 U/L (ref 11–51)

## 2019-04-10 MED ORDER — SODIUM CHLORIDE 0.9 % IV SOLN
2.0000 g | Freq: Once | INTRAVENOUS | Status: AC
  Administered 2019-04-10: 2 g via INTRAVENOUS
  Filled 2019-04-10: qty 20

## 2019-04-10 MED ORDER — SODIUM CHLORIDE 0.9 % IV BOLUS
500.0000 mL | Freq: Once | INTRAVENOUS | Status: AC
  Administered 2019-04-10: 14:00:00 500 mL via INTRAVENOUS

## 2019-04-10 MED ORDER — FENTANYL CITRATE (PF) 100 MCG/2ML IJ SOLN
50.0000 ug | Freq: Once | INTRAMUSCULAR | Status: AC
  Administered 2019-04-10: 50 ug via INTRAVENOUS
  Filled 2019-04-10: qty 2

## 2019-04-10 MED ORDER — METRONIDAZOLE IN NACL 5-0.79 MG/ML-% IV SOLN
500.0000 mg | Freq: Once | INTRAVENOUS | Status: AC
  Administered 2019-04-10: 500 mg via INTRAVENOUS
  Filled 2019-04-10: qty 100

## 2019-04-10 MED ORDER — FENTANYL CITRATE (PF) 100 MCG/2ML IJ SOLN
50.0000 ug | Freq: Once | INTRAMUSCULAR | Status: AC
  Administered 2019-04-10: 14:00:00 50 ug via INTRAVENOUS
  Filled 2019-04-10: qty 2

## 2019-04-10 MED ORDER — ONDANSETRON HCL 4 MG/2ML IJ SOLN
4.0000 mg | Freq: Once | INTRAMUSCULAR | Status: AC
  Administered 2019-04-10: 14:00:00 4 mg via INTRAVENOUS
  Filled 2019-04-10: qty 2

## 2019-04-10 MED ORDER — IOHEXOL 300 MG/ML  SOLN
100.0000 mL | Freq: Once | INTRAMUSCULAR | Status: AC | PRN
  Administered 2019-04-10: 100 mL via INTRAVENOUS

## 2019-04-10 NOTE — Telephone Encounter (Signed)
I had a message to call [name].  Yesterday, she started hurting in her left lower back and left hip area but then Thursday at about 4:00, it increased in severity and moved to the front left lower stomach.  She tossed and turned all night due to the pain.  This morning, it is not better and now it is also on the right lower abdomen in addition to the left.  The pain comes and goes but it never completely goes away.  She feels bloated and when she presses on her belly, or her dog stepped on her belly, the pain increases.  She states the pain severity is similar to when she had appendicitis.  She has nausea but no vomiting.  She had 2 normal stools yesterday.  She denies fever, dysuria, hematuria, or blood in her stools.  She is not dizzy but feels overall weak because she only drank a little water last night and may be a half a Dixie cup of ginger ale this morning.  She has had a lap chole in 2003, lap appendectomy in 2014. She had a colonoscopy in 2018 that was normal without mention of diverticulosis. She has atrial fibrillation but is on a DOAC.  Assessment : severe abdominal pain of unknown etiology.  Possible etiologies would include acute mesenteric ischemia even though she is on a DOAC.  Intra-abdominal hematoma due to the DOAC.  Small bowel obstruction although she had 2 normal bowel movements yesterday.  Ovarian torsion or other bone abnormality.  PLAN : Our clinic is closed Friday afternoons so I would not be able to examine her, get blood, or imaging.  She is in agreement to go to urgent care for examination and further evaluation and management.

## 2019-04-10 NOTE — ED Notes (Signed)
Patient transported to CT 

## 2019-04-10 NOTE — ED Provider Notes (Signed)
[facility]ENCY DEPARTMENT Provider Note   CSN: [id number] Arrival date & time: 04/10/19  1235     History   Chief Complaint Chief Complaint  Patient presents with   Abdominal Pain    HPI [name] is a 52 y.o. female with past medical history of appendectomy, cholecystectomy, GERD, GI bleed, A. fib on Eliquis, presenting to the emergency department with complaint of gradual onset of worsening lower abdominal pain.  Patient states initially she had left-sided low back pain, however yesterday it is progressed to left lower abdominal pain that is radiating across her abdomen to the right side.  Her pain is significantly worse with any movement or palpation.  She endorses associated nausea without vomiting, and bloating.  She relates her pain is similar to prior appendicitis, however her pain is more constant in nature.  She has tried over-the-counter Maalox and Gas-X without relief of symptoms.  Denies fever, chills, diarrhea, constipation, urinary symptoms.     The history is provided by the patient.    Past Medical History:  Diagnosis Date   Abnormal thyroid blood test    Mildly elevated TSH, resolved.   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 Anson Fret to evaluate anemia; pathology showed a tubulovillous adenoma with no high grade dysplasia or malignancy identified.     Anemia    Anxiety    Acute stress reaction to multiple ICD shocks   Appendicitis, acute, s/p apendectomy 01/17/2013   Atrial fibrillation ([place])    S/P radiofrequency ablation by Dr. Adrian Prows at [facility] on 02/09/2008 and again on 03/31/2009; S/P ablation at [facility] by Dr. Westly Pam on 08/10/2010, followed by recurrent atrial flutter/fibrillation.  Cardiac cath done on 02/19/2011 at [facility] showed no significant coronary disease.  Patient underwent Convergent Procedure at [facility] on 03/07/2011.      Atrial tachycardia [facility])    Status post convergent ablation [facility] October 2012.  She underwent successful catheter ablation of persistent atrial tachycardia at [facility] by Dr. Willis Modena on 05/24/2014.     Cholecystitis, acute 07/2001   S/P laparoscopic cholecystectomy, intraoperative cholangiogram, and transcystic common bile duct exploration by Dr. 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    Pelvic mass    Pelvic ultrasound done on 11/30/10 to evaluate abdominal pain showed,  positioned between the ovaries and contiguous with the ovaries, a complex mass demonstrating posterior acoustical enhancement and no definite intralesional flow with color Doppler exam measuring 5.2 x 3.1 x 5.5 cm. The appearance raised the possibility of a ruptured ovarian cyst with subsequent development of a hematoma.    Seasonal allergic rhinitis    Ventricular tachycardia ([place])    Hx of nonsustained ventricular tachycardia, S/P hospitalization in March 2009; S/P ICD placement by Dr. Deboraha Sprang on 09/25/2007.    Patient Active Problem List   Diagnosis Date Noted  Abdominal pain 04/10/2019   Bronchitis 08/19/2018   Elevated troponin 04/23/2018   Nausea 04/21/2018   Healthcare maintenance 03/13/2018   Sensorineural hearing loss of left ear 11/28/2015   Obesity, Class I, BMI 30-34.9 12/09/2014   Hyperglycemia 08/26/2013   Chronic combined systolic and diastolic congestive heart  failure ([place]) 05/20/2012   Adenomatous rectal polyp 05/22/2011   Pelvic mass 12/27/2010   Long-term (current) use of anticoagulants 06/27/2010   History of anticoagulant therapy 06/27/2010   Anxiety 03/01/2010   Hypertrophic obstructive cardiomyopathy ([place]) 09/03/2008   VENTRICULAR TACHYCARDIA 09/03/2008   Atrial tachycardia ([place]) 09/03/2008   Iron deficiency anemia 09/01/2008   Atrial fibrillation ([place]) 09/30/2007   ALLERGIC RHINITIS, SEASONAL 09/30/2007   GERD 09/30/2007   Automatic implantable cardioverter-defibrillator in situ 09/25/2007    Past Surgical History:  Procedure Laterality Date   APPENDECTOMY     CARDIAC DEFIBRILLATOR PLACEMENT  09/25/2007; 08/2013   Placed by Dr. Deboraha Sprang on 09/25/2007; gen change 08/2013 by Dr Caryl Comes (STJ dual chamber ICD)   Convergent Ablation  03/07/2011   Patient underwent Convergent Procedure at [facility] on 03/07/2011.   FLEXIBLE SIGMOIDOSCOPY  05/26/2011   Procedure: FLEXIBLE SIGMOIDOSCOPY;  Surgeon: Lear Ng, MD;  Location: [facility] ENDOSCOPY;  Service: Endoscopy;  Laterality: N/A;   IMPLANTABLE CARDIOVERTER DEFIBRILLATOR (ICD) GENERATOR CHANGE N/A 08/12/2013   Procedure: ICD GENERATOR CHANGE;  Surgeon: Deboraha Sprang, MD;  Location: [facility] CATH LAB;  Service: Cardiovascular;  Laterality: N/A;   LAPAROSCOPIC APPENDECTOMY N/A 01/17/2013   Procedure: APPENDECTOMY LAPAROSCOPIC;  Surgeon: Rolm Bookbinder, MD;  Location: [facility];  Service: General;  Laterality: N/A;   LAPAROSCOPIC CHOLECYSTECTOMY  07/14/2001   S/P laparoscopic cholecystectomy, intraoperative cholangiogram, and transcystic common bile duct exploration by Dr. Jackolyn Confer on 07/14/2001   RADIOFREQUENCY ABLATION  08/10/2010   ablation  done at [place]  03/2009   ablation at [place]  01/2008   ablation at [place]  05/24/2014   Successful radiofrequency catheter ablation by Dr. Willis Modena at [facility].      OB History    Gravida  0   Para  0   Term  0   Preterm  0   AB  0   Living  0     SAB  0   TAB  0   Ectopic  0   Multiple  0   Live Births  0            Home Medications    Prior to Admission medications   Medication Sig Start Date End Date Taking? Authorizing Provider  apixaban (ELIQUIS) 5 MG TABS tablet Take 5 mg by mouth 2 (two) times daily. 07/18/14   [provider]  dofetilide (TIKOSYN) 500 MCG capsule Take 500 mcg by mouth 2 (two) times daily.   12/15/10   [provider]  fluticasone (FLONASE) 50 MCG/ACT nasal spray Place 2 sprays into both nostrils daily.    [provider]  furosemide (LASIX) 20 MG tablet TAKE 1 TABLET (20 MG TOTAL) BY MOUTH DAILY. Can take additional 20 mg as needed for weight gain 05/25/14   [provider]  loratadine (CLARITIN) 10 MG tablet Take 10 mg by mouth daily as needed.     [provider]  metoprolol succinate (TOPROL-XL) 100 MG 24 hr tablet Take 50 mg by mouth at bedtime.  07/20/14   [provider]  Omega 3 1000 MG CAPS Take by  mouth as needed. 11/13/10   [provider]  sertraline (ZOLOFT) 100 MG tablet Take 50 mg by mouth daily. 06/18/14   [provider]  esomeprazole (NEXIUM) 20 MG capsule Take 1 capsule (20 mg total) by mouth daily. 05/01/11 08/29/11  Acquanetta Chain, DO    Family History Family History  Problem Relation Age of Onset   Hypertrophic cardiomyopathy Sister    Heart disease Sister    Hypertension Sister    Throat cancer Father    Coronary artery disease Father 81       S/P CABG   Heart disease Father    Cancer Father        throat   Colon polyps Mother    Hypertrophic cardiomyopathy Mother    Heart disease Mother    Hyperlipidemia Mother    Cancer Maternal Aunt        breast   Heart disease Sister    Hypertrophic cardiomyopathy Cousin        3 cousins on mother's side Deceased   Breast cancer Paternal Aunt     Hypertrophic cardiomyopathy Maternal Grandmother    Hypertrophic cardiomyopathy Other        great uncle   Ovarian cancer Neg Hx        great aunt had   Colon cancer Neg Hx    Lung cancer Neg Hx    Diabetes Neg Hx     Social History Social History   Tobacco Use   Smoking status: Never Smoker   Smokeless tobacco: Never Used  Substance Use Topics   Alcohol use: Yes    Alcohol/week: 1.0 standard drinks    Types: 1 Standard drinks or equivalent per week    Comment: very rarely, backed way of   Drug use: No     Allergies   Patient has no known allergies.   Review of Systems Review of Systems  Gastrointestinal: Positive for abdominal pain and nausea. Negative for constipation, diarrhea and vomiting.  All other systems reviewed and are negative.    Physical Exam Updated Vital Signs BP 114/75    Pulse 64    Temp 99.1 F (37.3 C) (Oral)    Resp 18    Ht 5\' 6"  (1.676 m)    Wt 81.6 kg    LMP 10/06/2011    SpO2 93%    BMI 29.05 kg/m   Physical Exam Vitals signs and nursing note reviewed.  Constitutional:      Appearance: She is well-developed.     Comments: Patient appears very uncomfortable  HENT:     Head: Normocephalic and atraumatic.  Eyes:     Conjunctiva/sclera: Conjunctivae normal.  Cardiovascular:     Rate and Rhythm: Normal rate and regular rhythm.  Pulmonary:     Effort: Pulmonary effort is normal. No respiratory distress.     Breath sounds: Normal breath sounds.  Abdominal:     General: Bowel sounds are decreased.     Palpations: Abdomen is soft.     Tenderness: There is generalized abdominal tenderness (Generalized tenderness though worse in the lower quadrants with some rebound tenderness present.).  Skin:    General: Skin is warm.  Neurological:     Mental Status: She is alert.  Psychiatric:        Behavior: Behavior normal.      ED Treatments / Results  Labs (all labs ordered are listed, but only abnormal results are  displayed) Labs Reviewed  COMPREHENSIVE METABOLIC PANEL - Abnormal; Notable  for the following components:      Result Value   Glucose, Bld 113 (*)    Total Bilirubin 2.2 (*)    All other components within normal limits  CBC WITH DIFFERENTIAL/PLATELET - Abnormal; Notable for the following components:   WBC 11.6 (*)    Neutro Abs 9.6 (*)    Monocytes Absolute 1.2 (*)    All other components within normal limits  URINALYSIS, ROUTINE W REFLEX MICROSCOPIC - Abnormal; Notable for the following components:   Hgb urine dipstick MODERATE (*)    Ketones, ur 15 (*)    All other components within normal limits  URINALYSIS, MICROSCOPIC (REFLEX) - Abnormal; Notable for the following components:   Bacteria, UA MANY (*)    All other components within normal limits  SARS CORONAVIRUS 2 BY RT PCR (HOSPITAL ORDER, [place] [facility])  LIPASE, BLOOD    EKG None  Radiology Ct Abdomen Pelvis W Contrast  Result Date: 04/10/2019 CLINICAL DATA:  Acute, generalized abdominal pain. Leukocytosis. EXAM: CT ABDOMEN AND PELVIS WITH CONTRAST TECHNIQUE: Multidetector CT imaging of the abdomen and pelvis was performed using the standard protocol following bolus administration of intravenous contrast. CONTRAST:  170mL OMNIPAQUE IOHEXOL 300 MG/ML  SOLN COMPARISON:  01/16/2013 FINDINGS: Lower chest: Mildly progressive enlargement of the right atrium and inferior vena cava. Stable left atrial enlargement with interval linear wall calcifications. Intracardiac pacer/AICD leads remain in place. Hepatobiliary: Cholecystectomy clips. No significant change in the previously demonstrated multiple liver cysts. The previously seen 1.5 cm probable hemangioma in the inferior aspect of the right lobe of the liver is not visible on today's images without intravenous contrast. Pancreas: Unremarkable. No pancreatic ductal dilatation or surrounding inflammatory changes. Spleen: Normal in size without focal abnormality.  Adrenals/Urinary Tract: Adrenal glands are unremarkable. Kidneys are normal, without renal calculi, focal lesion, or hydronephrosis. Bladder is unremarkable. Stomach/Bowel: Pronounced low density wall thickening involving a segment of right colon, beginning inferior to the hepatic flexure and extending into the proximal portion of the flexure. Extensive soft tissue stranding and edema in the adjacent omentum. There is an oval area of low density with a thin surrounding rim of enhancement in the medial wall, measuring 1.5 x 0.9 cm on image number 47 series 2. This portion of the colon appeared normal previously. Sigmoid colon and splenic flexure diverticula without evidence of diverticulitis. Unremarkable stomach and small bowel. Surgically absent appendix. Vascular/Lymphatic: No significant vascular findings are present. No enlarged abdominal or pelvic lymph nodes. Reproductive: Uterus and bilateral adnexa are unremarkable. Other: Small umbilical hernia containing fat. Musculoskeletal: Lower thoracic spine degenerative changes and mild lumbar spine degenerative changes. IMPRESSION: 1. Pronounced low density wall thickening involving a segment of right colon, beginning inferior to the hepatic flexure and extending into the proximal portion of the hepatic flexure with extensive surrounding soft tissue stranding/edema. There is also possible 1.5 cm developing abscess in the wall medially. Differential considerations include epiploic appendagitis, diverticulitis and focal segmental colitis. This does not have the typical appearance of a neoplastic process. 2. Sigmoid and splenic flexure diverticulosis. 3. Mildly progressive biatrial enlargement. 4. Stable multiple liver cysts. Electronically Signed   By: Claudie Revering M.D.   On: 04/10/2019 14:53    Procedures Procedures (including critical care time)  Medications Ordered in ED Medications  cefTRIAXone (ROCEPHIN) 2 g in sodium chloride 0.9 % 100 mL IVPB (0 g  Intravenous Stopped 04/10/19 1555)    And  metroNIDAZOLE (FLAGYL) IVPB 500 mg (500 mg Intravenous  New Bag/Given 04/10/19 1600)  fentaNYL (SUBLIMAZE) injection 50 mcg (50 mcg Intravenous Given 04/10/19 1341)  ondansetron (ZOFRAN) injection 4 mg (4 mg Intravenous Given 04/10/19 1341)  sodium chloride 0.9 % bolus 500 mL (0 mLs Intravenous Stopped 04/10/19 1515)  iohexol (OMNIPAQUE) 300 MG/ML solution 100 mL (100 mLs Intravenous Contrast Given 04/10/19 1426)  fentaNYL (SUBLIMAZE) injection 50 mcg (50 mcg Intravenous Given 04/10/19 1538)     Initial Impression / Assessment and Plan / ED Course  I have reviewed the triage vital signs and the nursing notes.  Pertinent labs & imaging results that were available during my care of the patient were reviewed by me and considered in my medical decision making (see chart for details).  Clinical Course as of Apr 09 1638  Fri Apr 09, 7674  8756 53 year old female here with left lower quadrant abdominal pain worse since last night that is now become more generalized.  Denies any fever.  Has been nauseous.  Has a low-grade fever here.  She is diffusely tender.  Getting labs and a CT abdomen and pelvis.   [MB]  52 Discussed with Dr. Johney Maine with surgery.  Recommends medical admission, IV antibiotics, and surgery to consult.  Does not believe this is emergent surgical intervention at this time.  Per [facility] there are no available beds at [facility], will look into transferring to [facility]   [JR]  1618 Dr. Doristine Bosworth accepting admission to [facility].   [JR]    Clinical Course User Index [JR] Maikayla Beggs, [place] N, PA-C [MB] Hayden Rasmussen, MD       Patient presenting with generalized abdominal pain that began yesterday in the left lower quadrant and has migrated towards the right abdomen.  She has history of appendectomy and cholecystectomy.  On arrival, patient is afebrile, does not meet sepsis criteria.  She is very uncomfortable with any movement  and abdomen is diffusely tender with peritoneal signs.  Labs and imaging ordered.  IV fentanyl provided for pain as patient's blood pressure is 111/58.  CBC with leukocytosis of 11.8.  Normal renal and hepatic function.  CT scan with bowel wall thickening and inflammation with stranding of the right colon with findings concerning for possible developing 1.5 cm abscess.  Findings were discussed with Dr. Johney Maine with general surgery.  At this time he recommends IV antibiotics and medical admission.  Does not believe this requires surgical intervention at this time.  He will consult on patient upon arrival to facility.  Patient will be admitted to [facility], Dr. Doristine Bosworth accepting admission.  Final Clinical Impressions(s) / ED Diagnoses   Final diagnoses:  Colitis  Intra-abdominal abscess [facility])    ED Discharge Orders    None       Nakyah Erdmann, [place] N, PA-C 04/10/19 1639    Hayden Rasmussen, MD 04/10/19 [phone]

## 2019-04-10 NOTE — ED Triage Notes (Signed)
After working in her yard a few days ago she had lower back pain that has progressed to abdominal pain across her abdomen. Last BM was yesterday.

## 2019-04-10 NOTE — ED Notes (Signed)
Patient made aware of bed assignment to [facility] [id number], awaiting carelink for transport.  Call placed to sister Hilda Blades to notify of status.   Pt resting quietly with eyes closed.

## 2019-04-11 DIAGNOSIS — Z79899 Other long term (current) drug therapy: Secondary | ICD-10-CM

## 2019-04-11 DIAGNOSIS — Z9049 Acquired absence of other specified parts of digestive tract: Secondary | ICD-10-CM

## 2019-04-11 DIAGNOSIS — I422 Other hypertrophic cardiomyopathy: Secondary | ICD-10-CM

## 2019-04-11 DIAGNOSIS — Z7901 Long term (current) use of anticoagulants: Secondary | ICD-10-CM

## 2019-04-11 DIAGNOSIS — I4891 Unspecified atrial fibrillation: Secondary | ICD-10-CM

## 2019-04-11 DIAGNOSIS — Z9889 Other specified postprocedural states: Secondary | ICD-10-CM

## 2019-04-11 DIAGNOSIS — I5042 Chronic combined systolic (congestive) and diastolic (congestive) heart failure: Secondary | ICD-10-CM

## 2019-04-11 DIAGNOSIS — K651 Peritoneal abscess: Principal | ICD-10-CM

## 2019-04-11 DIAGNOSIS — Z95810 Presence of automatic (implantable) cardiac defibrillator: Secondary | ICD-10-CM

## 2019-04-11 DIAGNOSIS — Z8679 Personal history of other diseases of the circulatory system: Secondary | ICD-10-CM

## 2019-04-11 HISTORY — DX: Peritoneal abscess: K65.1

## 2019-04-11 LAB — COMPREHENSIVE METABOLIC PANEL
ALT: 12 U/L (ref 0–44)
AST: 14 U/L — ABNORMAL LOW (ref 15–41)
Albumin: 3.4 g/dL — ABNORMAL LOW (ref 3.5–5.0)
Alkaline Phosphatase: 67 U/L (ref 38–126)
Anion gap: 9 (ref 5–15)
BUN: 9 mg/dL (ref 6–20)
CO2: 25 mmol/L (ref 22–32)
Calcium: 8.8 mg/dL — ABNORMAL LOW (ref 8.9–10.3)
Chloride: 105 mmol/L (ref 98–111)
Creatinine, Ser: 0.96 mg/dL (ref 0.44–1.00)
GFR calc Af Amer: >60 mL/min (ref >60)
GFR calc non Af Amer: >60 mL/min (ref >60)
Glucose, Bld: 111 mg/dL — ABNORMAL HIGH (ref 70–99)
Potassium: 3.8 mmol/L (ref 3.5–5.1)
Sodium: 139 mmol/L (ref 135–145)
Total Bilirubin: 2.1 mg/dL — ABNORMAL HIGH (ref 0.3–1.2)
Total Protein: 6 g/dL — ABNORMAL LOW (ref 6.5–8.1)

## 2019-04-11 LAB — URINALYSIS, ROUTINE W REFLEX MICROSCOPIC
Bacteria, UA: NONE SEEN
Bilirubin Urine: NEGATIVE
Glucose, UA: NEGATIVE mg/dL
Ketones, ur: 20 mg/dL — AB
Leukocytes,Ua: NEGATIVE
Nitrite: NEGATIVE
Protein, ur: NEGATIVE mg/dL
Specific Gravity, Urine: 1.02 (ref 1.005–1.030)
pH: 5 (ref 5.0–8.0)

## 2019-04-11 LAB — HIV ANTIBODY (ROUTINE TESTING W REFLEX): HIV Screen 4th Generation wRfx: NONREACTIVE

## 2019-04-11 LAB — CBC
HCT: 34.7 % — ABNORMAL LOW (ref 36.0–46.0)
Hemoglobin: 11.2 g/dL — ABNORMAL LOW (ref 12.0–15.0)
MCH: 29.6 pg (ref 26.0–34.0)
MCHC: 32.3 g/dL (ref 30.0–36.0)
MCV: 91.8 fL (ref 80.0–100.0)
Platelets: 205 10*3/uL (ref 150–400)
RBC: 3.78 MIL/uL — ABNORMAL LOW (ref 3.87–5.11)
RDW: 13.6 % (ref 11.5–15.5)
WBC: 9.9 10*3/uL (ref 4.0–10.5)
nRBC: 0 % (ref 0.0–0.2)

## 2019-04-11 LAB — BILIRUBIN, DIRECT: Bilirubin, Direct: 0.5 mg/dL — ABNORMAL HIGH (ref 0.0–0.2)

## 2019-04-11 MED ORDER — DOFETILIDE 250 MCG PO CAPS
500.0000 ug | ORAL_CAPSULE | Freq: Two times a day (BID) | ORAL | Status: DC
  Administered 2019-04-11 – 2019-04-13 (×6): 500 ug via ORAL
  Filled 2019-04-11 (×6): qty 2

## 2019-04-11 MED ORDER — METOPROLOL SUCCINATE ER 50 MG PO TB24: 50.0000 mg | ORAL_TABLET | Freq: Every day | ORAL | Status: DC

## 2019-04-11 MED ORDER — APIXABAN 5 MG PO TABS
5.0000 mg | ORAL_TABLET | Freq: Two times a day (BID) | ORAL | Status: DC
  Administered 2019-04-11 – 2019-04-12 (×5): 5 mg via ORAL
  Filled 2019-04-11 (×5): qty 1

## 2019-04-11 MED ORDER — SERTRALINE HCL 50 MG PO TABS
50.0000 mg | ORAL_TABLET | Freq: Every day | ORAL | Status: DC
  Administered 2019-04-11 – 2019-04-12 (×2): 50 mg via ORAL
  Filled 2019-04-11 (×2): qty 1

## 2019-04-11 MED ORDER — PIPERACILLIN-TAZOBACTAM 3.375 G IVPB 30 MIN
3.3750 g | Freq: Once | INTRAVENOUS | Status: AC
  Administered 2019-04-11: 3.375 g via INTRAVENOUS
  Filled 2019-04-11 (×2): qty 50

## 2019-04-11 MED ORDER — PIPERACILLIN-TAZOBACTAM 3.375 G IVPB
3.3750 g | Freq: Three times a day (TID) | INTRAVENOUS | Status: DC
  Administered 2019-04-11 – 2019-04-13 (×7): 3.375 g via INTRAVENOUS
  Filled 2019-04-11 (×7): qty 50

## 2019-04-11 MED ORDER — METOPROLOL SUCCINATE ER 50 MG PO TB24
50.0000 mg | ORAL_TABLET | Freq: Every day | ORAL | Status: DC
  Administered 2019-04-11 – 2019-04-12 (×2): 50 mg via ORAL
  Filled 2019-04-11 (×2): qty 1

## 2019-04-11 MED ORDER — ACETAMINOPHEN 650 MG RE SUPP: 650.0000 mg | Freq: Four times a day (QID) | RECTAL | Status: DC | PRN

## 2019-04-11 MED ORDER — ONDANSETRON HCL 4 MG PO TABS
4.0000 mg | ORAL_TABLET | Freq: Four times a day (QID) | ORAL | Status: DC | PRN
  Administered 2019-04-11: 4 mg via ORAL
  Filled 2019-04-11: qty 1

## 2019-04-11 MED ORDER — HYDROMORPHONE HCL 1 MG/ML IJ SOLN: 0.5000 mg | INTRAMUSCULAR | Status: DC | PRN

## 2019-04-11 MED ORDER — HYDROMORPHONE HCL 2 MG PO TABS
2.0000 mg | ORAL_TABLET | ORAL | Status: DC | PRN
  Administered 2019-04-11: 2 mg via ORAL
  Filled 2019-04-11: qty 1

## 2019-04-11 MED ORDER — ONDANSETRON HCL 4 MG/2ML IJ SOLN: 4.0000 mg | Freq: Four times a day (QID) | INTRAMUSCULAR | Status: DC | PRN

## 2019-04-11 MED ORDER — SODIUM CHLORIDE 0.9% FLUSH
3.0000 mL | Freq: Two times a day (BID) | INTRAVENOUS | Status: DC
  Administered 2019-04-11: 01:00:00 3 mL via INTRAVENOUS

## 2019-04-11 MED ORDER — ACETAMINOPHEN 325 MG PO TABS
650.0000 mg | ORAL_TABLET | Freq: Four times a day (QID) | ORAL | Status: DC | PRN
  Administered 2019-04-11 (×2): 650 mg via ORAL
  Filled 2019-04-11 (×2): qty 2

## 2019-04-11 NOTE — Progress Notes (Signed)
Subjective:  Saw patient this morning during rounds. Patient is doing well. She continues to have intense right sided abdominal pain but denies nausea/vomiting/fevers/chills as well as sick contacts/recent travel.  Objective:  Vital signs in last 24 hours: Vitals:   04/10/19 1900 04/10/19 1930 04/10/19 2000 04/10/19 2129  BP: (!) 104/59 (!) 112/57 (!) 112/55 102/68  Pulse: 60 65 63 (!) 59  Resp:    16  Temp:    98.8 F (37.1 C)  TempSrc:      SpO2: 95% 95% 96% 95%  Weight:      Height:       Weight change:   Intake/Output Summary (Last 24 hours) at 04/11/2019 1028 Last data filed at 04/11/2019 0817 Gross per 24 hour  Intake 1400 ml  Output 1250 ml  Net 150 ml     CBC Latest Ref Rng & Units 04/11/2019 04/10/2019 04/21/2018  WBC 4.0 - 10.5 K/uL 9.9 11.6(H) 7.0  Hemoglobin 12.0 - 15.0 g/dL 11.2(L) 12.4 12.8  Hematocrit 36.0 - 46.0 % 34.7(L) 40.0 41.9  Platelets 150 - 400 K/uL 205 225 239   BMP Latest Ref Rng & Units 04/11/2019 04/10/2019 04/21/2018  Glucose 70 - 99 mg/dL 111(H) 113(H) 92  BUN 6 - 20 mg/dL 9 11 11   Creatinine 0.44 - 1.00 mg/dL 0.96 0.99 0.79  Sodium 135 - 145 mmol/L 139 139 140  Potassium 3.5 - 5.1 mmol/L 3.8 4.2 3.6  Chloride 98 - 111 mmol/L 105 104 107  CO2 22 - 32 mmol/L 25 24 25   Calcium 8.9 - 10.3 mg/dL 8.8(L) 9.2 9.6   EKG: my interpretation is sinus rhythm with normal rate, Left axis deviation, Q waves, prolonged PR interval, widened QRS suspicious for LBBB, these findings are unchanged from previous EKG in 2019   Physical Exam  Constitutional: She is oriented to person, place, and time. She appears well-developed and well-nourished.  HENT:  Head: Normocephalic and atraumatic.  Eyes: Pupils are equal, round, and reactive to light. Conjunctivae and EOM are normal.  Cardiovascular: Normal rate, regular rhythm, S1 normal, S2 normal and normal heart sounds. Exam reveals no gallop and no friction rub.  No murmur heard. Pulmonary/Chest: Effort  normal and breath sounds normal. No respiratory distress. She has no wheezes.  Right sided pleuritic chest pain  Abdominal: Soft. Bowel sounds are normal. She exhibits no mass. There is abdominal tenderness in the right upper quadrant and right lower quadrant. There is rebound.  Neurological: She is alert and oriented to person, place, and time.  Skin: Skin is warm and dry.    Patient Summary [name] is a 53 year old female with past medical history of HOCM, atrial fibrillation status-post ablation x5, ventricular tachycardia status post ICD, and combined chronic diastolic and systolic heart failure presenting with right sided abdominal pain found to be intraabdominal abscess.   CT scan revealed 1.5 cm intraabdominal mass near the hepatic flexure which is being treated with broad-spectrum antibiotics.   Assessment/Plan:  Principal Problem:   Intra-abdominal abscess ([place]) -patient presented with mild leukocytosis of 11.9 that is now resolved (9.9). Patient is afebrile and hemodynamically stable -patient had prior cholecystectomy and appendectomy Plan: -per surgery recommendations, will continue conservative management with antibiotic therapy, patient completing day 1 of zosyn 3.375 g q8 hours  Hyperbilirubinemia -patient has history of previous cholecystectomy -bilirubin elevated at 2.1 (indirect of 1.6) -given clinical picture in the setting of illness elevated indirect bilirubinemia is not uncommon Plan: -continue to monitor bilirubin  Normocytic Anemia -Hgb returned 11.2 down from 12.4 on presentation -no reports of bleeding or dark urine/dark stools -most likely due to decreased po intake over past few days Plan: -will continue to monitor    LOS: 1 day   Carolyne Littles, Medical Student 04/11/2019, 10:28 AM

## 2019-04-11 NOTE — H&P (Addendum)
Date: 04/11/2019               Patient Name:  [name] MRN: [id number]  DOB: [birth year] Age / Sex: 53 y.o., female   PCP: Bartholomew Crews, MD         Medical Service: Internal Medicine Teaching Service         Attending Physician: Dr. Aldine Contes, MD    First Contact: Marianna Payment, MD, Marland Kitchen  Pager: [facility] [phone])  Second Contact: Koleen Distance, DO, [place] Pager: Meriel Flavors [phone])       After Hours (After 5p/  First Contact Pager: [phone]  weekends / holidays): Second Contact Pager: [phone]   Chief Complaint: abdominal pain   History of Present Illness: 53 y.o. 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 She reports that she started having left sided lower back pain on Monday which she thought was due to working in her yard initially. However, on Thursday, she started experiencing the radiation of the back pain to her left lower quadrant. She reports this radiation was intermittent in nature initially but progressively worsened to include her entire lower abdomen on Thursday. On Thursday evening, patient experienced significant pain, bloating, diaphoresis and nausea. She reports generalized weakness and decreased PO intake since Thursday. She denies any fever, chills, vomiting, diarrhea, urinary symptoms, recent travel, or ingestion of uncooked meats. Her last BM was Thursday morning. She contacted her PCP on Friday who recommended for evaluation in the ED for concerns of acute mesenteric ischemia, intra-abdominal hematoma, or small bowel obstruction.   ED course: Patient presented with generalized abdominal pain. Abdomen is diffusely tender with peritoneal signs. CBC with mild leukocytosis and CT scan finding of possible 1.5cm developing abscess. General surgery was consulted and did not recommend emergent surgical intervention. Patient  recommended for IV antibiotic treatment. She received rocephin, zosyn, and metronidazole in the ED. Patient to be admitted to internal medicine for further evaluation and management.    Meds:  No outpatient medications have been marked as taking for the 04/10/19 encounter [facility] Encounter).  Eliquis 5mg  bid Tikosyn 544mcg bid Furosemide 20mg  qd Metoprolol succinate 50mg  qHS Zoloft 50mg  qd  Allergies: Allergies as of 04/10/2019   (No Known Allergies)   Past Medical History:  Diagnosis Date   Abnormal thyroid blood test    Mildly elevated TSH, resolved.   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 Anson Fret to evaluate anemia; pathology showed a tubulovillous adenoma with no high grade dysplasia or malignancy identified.     Anemia    Anxiety    Acute stress reaction to multiple ICD shocks   Appendicitis, acute, s/p apendectomy 01/17/2013   Atrial fibrillation ([place])    S/P radiofrequency ablation by Dr. Adrian Prows at [facility] on 02/09/2008 and again on 03/31/2009; S/P ablation at [facility] by Dr. Westly Pam on 08/10/2010, followed by recurrent atrial flutter/fibrillation.  Cardiac cath done on 02/19/2011 at [facility] showed no significant coronary disease.  Patient underwent Convergent Procedure at [facility] on 03/07/2011.    Atrial tachycardia [facility])    Status post convergent ablation [facility] October 2012.  She underwent successful catheter ablation of persistent atrial tachycardia at [facility] by Dr. Willis Modena on 05/24/2014.     Cholecystitis, acute 07/2001   S/P laparoscopic cholecystectomy, intraoperative cholangiogram, and transcystic common  bile duct exploration by Dr. Jackolyn Confer on 07/14/2001   Dizziness    Fibroids    GERD (gastroesophageal reflux disease)    GI bleed 05/25/2011   Hyperglycemia    HYPERGLYCEMIA 12/24/2008   Qualifier: Diagnosis of  By: Marinda Elk MD, Sonia Side     Hypertrophic  obstructive cardiomyopathy (HOCM) ([place])    TEE 09/02/2007 showed moderate to severe left ventricular hypertrophy with an interventricular septal dimension of 1.5 cm and posterior wall dimension of 7 cm; there was normal LV systolic function and estimated EF of approximately 60%; there did not appear to be any obvious outflow tract obstruction.    Pelvic mass    Pelvic ultrasound done on 11/30/10 to evaluate abdominal pain showed,  positioned between the ovaries and contiguous with the ovaries, a complex mass demonstrating posterior acoustical enhancement and no definite intralesional flow with color Doppler exam measuring 5.2 x 3.1 x 5.5 cm. The appearance raised the possibility of a ruptured ovarian cyst with subsequent development of a hematoma.    Seasonal allergic rhinitis    Ventricular tachycardia ([place])    Hx of nonsustained ventricular tachycardia, S/P hospitalization in March 2009; S/P ICD placement by Dr. Deboraha Sprang on 09/25/2007.    Family History:  Family History  Problem Relation Age of Onset   Hypertrophic cardiomyopathy Sister    Heart disease Sister    Hypertension Sister    Throat cancer Father    Coronary artery disease Father 54       S/P CABG   Heart disease Father    Cancer Father        throat   Colon polyps Mother    Hypertrophic cardiomyopathy Mother    Heart disease Mother    Hyperlipidemia Mother    Cancer Maternal Aunt        breast   Heart disease Sister    Hypertrophic cardiomyopathy Cousin        3 cousins on mother's side Deceased   Breast cancer Paternal Aunt    Hypertrophic cardiomyopathy Maternal Grandmother    Hypertrophic cardiomyopathy Other        great uncle   Ovarian cancer Neg Hx        great aunt had   Colon cancer Neg Hx    Lung cancer Neg Hx    Diabetes Neg Hx      Social History:  Social History   Tobacco Use   Smoking status: Never Smoker   Smokeless tobacco: Never Used  Substance Use Topics    Alcohol use: Yes    Alcohol/week: 1.0 standard drinks    Types: 1 Standard drinks or equivalent per week    Comment: very rarely, backed way of   Drug use: No     Review of Systems: A complete ROS was negative except as per HPI.   Physical Exam: Blood pressure 102/68, pulse (!) 59, temperature 98.8 F (37.1 C), resp. rate 16, height 5\' 6"  (1.676 m), weight 81.6 kg, last menstrual period 10/06/2011, SpO2 95 %. Physical Exam Vitals signs and nursing note reviewed.  Constitutional:      General: She is not in acute distress.    Appearance: She is well-developed. She is not ill-appearing or diaphoretic.  HENT:     Head: Normocephalic and atraumatic.  Eyes:     General: No scleral icterus.    Extraocular Movements: Extraocular movements intact.     Pupils: Pupils are equal, round, and reactive to light.  Cardiovascular:  Rate and Rhythm: Normal rate and regular rhythm.     Heart sounds: Normal heart sounds. No murmur. No friction rub. No gallop.   Pulmonary:     Effort: Pulmonary effort is normal. No respiratory distress.     Breath sounds: Normal breath sounds. No wheezing or rhonchi.  Abdominal:     General: Bowel sounds are normal. There is no distension.     Palpations: Abdomen is soft.     Tenderness: There is abdominal tenderness in the right upper quadrant, right lower quadrant and epigastric area. There is rebound. There is no right CVA tenderness, left CVA tenderness or guarding.     Hernia: No hernia is present.  Skin:    General: Skin is warm and dry.     Capillary Refill: Capillary refill takes less than 2 seconds.  Neurological:     General: No focal deficit present.     Mental Status: She is alert and oriented to person, place, and time.     Motor: No weakness.     CBC Latest Ref Rng & Units 04/10/2019 04/21/2018 07/23/2016  WBC 4.0 - 10.5 K/uL 11.6(H) 7.0 7.2  Hemoglobin 12.0 - 15.0 g/dL 12.4 12.8 13.2  Hematocrit 36.0 - 46.0 % 40.0 41.9 41.4  Platelets  150 - 400 K/uL 225 239 289   CMP     Component Value Date/Time   NA 139 04/10/2019 1335   K 4.2 04/10/2019 1335   CL 104 04/10/2019 1335   CO2 24 04/10/2019 1335   GLUCOSE 113 (H) 04/10/2019 1335   BUN 11 04/10/2019 1335   CREATININE 0.99 04/10/2019 1335   CREATININE 0.91 07/23/2016 1500   CALCIUM 9.2 04/10/2019 1335   PROT 7.2 04/10/2019 1335   PROT 6.4 09/01/2015 1454   ALBUMIN 4.2 04/10/2019 1335   ALBUMIN 4.4 09/01/2015 1454   AST 19 04/10/2019 1335   ALT 13 04/10/2019 1335   ALKPHOS 76 04/10/2019 1335   BILITOT 2.2 (H) 04/10/2019 1335   BILITOT 0.6 09/01/2015 1454   GFRNONAA >60 04/10/2019 1335   GFRNONAA 80 12/09/2014 0904   GFRAA >60 04/10/2019 1335   GFRAA >89 12/09/2014 0904    CT ABDOMEN PELVIS W CONTRAST:  IMPRESSION: 1. Pronounced low density wall thickening involving a segment of right colon, beginning inferior to the hepatic flexure and extending into the proximal portion of the hepatic flexure with extensive surrounding soft tissue stranding/edema. There is also possible 1.5cm developing abscess in the wall medially. Differential considerations include epiploic appendagitis, diverticulitis and focal segmental colitis. This does not have the typical appearance of a neoplastic process. 2. Sigmoid and splenic flexure diverticulosis. 3. Mildly progressive biatrial enlargement. 4. Stable multiple liver cysts.  Assessment & Plan by Problem: Active Problems:   Abdominal pain  Patient is a 53yo female with PMHx of A.fib s/p ablation x5 and on Eliquis, HOCM, v tach s/p ICD placement, and combined chronic diastolic and diastolic heart failure presenting with lower abdominal pain and nausea since Thursday found to have a possible intraabdominal abscess on CT Abdomen.   Intraabdominal abscess: Patient with left lower back pain since Monday that has since radiated to the left and right lower quadrants for two days duration. Patient also reports associated abdominal  distension, nausea and decreased PO intake since Thursday evening. Patient is afebrile and hemodynamically stable. She appears comfortable on examination but experiences significant pain in lower abdomen on movement. Abdomen appears slightly distended with normoactive bowel sounds. She has significant tenderness to palpation in  the right upper and lower quadrants. Mild leukocytosis on labs.  CT abdomen concerning for right colonic thickening and possible 1.5cm intraabdominal abscess in the colonic wall. No immediate surgical intervention at this time but surgery is following and recommend for treatment with antibiotics.  - Zosyn 3.375g IV q8h - Zofran 5mg  q6h prn  - Dilaudid 0.5mg  q4h prn - f/u CBC  Hyperbilirubinemia:  Patient noted to have hyperbilirubinemia on labs. She has a history of cholecystectomy. She does not appear to be jaundiced and no scleral icterus noted. She has right upper quadrant tenderness on examination and CT findings significant for stable multiple liver cysts.  - Obtaining indirect vs direct bilirubin levels  - Continue to monitor  Hx of HOCM: Hx of A. Fib:  Patient with history of atrial fibrillation s/p ablation x5. She is on Eliquis 5mg  bid and Tikosyn . On examination, patient has normal rate and rhythm and does not appear to be in A.fib on cardiac monitoring.  - Eliquis 5mg  bid, Metoprolol 50mg  qHS, and Tikosyn 566mcg qd - Cardiac monitoring  Diet: NPO  Code: FULL DVT Prophylaxis: Eliquis   Dispo: Admit patient to Inpatient with expected length of stay greater than 2 midnights.  Signed: Harvie Heck, MD  Internal Medicine PGY-1 04/11/2019, 12:38 AM  Pager: [phone]

## 2019-04-11 NOTE — Progress Notes (Signed)
Paged on call number to let them know her urine appears oily on top.

## 2019-04-11 NOTE — Progress Notes (Signed)
Pharmacy Antibiotic Note  [name] is a 53 y.o. female admitted on 04/10/2019 with intra-abdominal infection with possible developing abscess.  Pharmacy has been consulted for Zosyn dosing.  Plan: Zosyn 3.375g IV q8h (4-hour infusion).  Height: 5\' 6"  (167.6 cm) Weight: 180 lb (81.6 kg) IBW/kg (Calculated) : 59.3  Temp (24hrs), Avg:99.3 F (37.4 C), Min:98.8 F (37.1 C), Max:99.9 F (37.7 C)  Recent Labs  Lab 04/10/19 1335  WBC 11.6*  CREATININE 0.99    Estimated Creatinine Clearance: 69.1 mL/min (by C-G formula based on SCr of 0.99 mg/dL).    No Known Allergies   Thank you for allowing pharmacy to be a part of this patient's care.  Wynona Neat, PharmD, BCPS  04/11/2019 12:30 AM

## 2019-04-12 LAB — COMPREHENSIVE METABOLIC PANEL
ALT: 12 U/L (ref 0–44)
AST: 16 U/L (ref 15–41)
Albumin: 3.4 g/dL — ABNORMAL LOW (ref 3.5–5.0)
Alkaline Phosphatase: 86 U/L (ref 38–126)
Anion gap: 11 (ref 5–15)
BUN: 14 mg/dL (ref 6–20)
CO2: 23 mmol/L (ref 22–32)
Calcium: 8.8 mg/dL — ABNORMAL LOW (ref 8.9–10.3)
Chloride: 104 mmol/L (ref 98–111)
Creatinine, Ser: 1.02 mg/dL — ABNORMAL HIGH (ref 0.44–1.00)
GFR calc Af Amer: >60 mL/min (ref >60)
GFR calc non Af Amer: >60 mL/min (ref >60)
Glucose, Bld: 77 mg/dL (ref 70–99)
Potassium: 3.3 mmol/L — ABNORMAL LOW (ref 3.5–5.1)
Sodium: 138 mmol/L (ref 135–145)
Total Bilirubin: 1.4 mg/dL — ABNORMAL HIGH (ref 0.3–1.2)
Total Protein: 6.1 g/dL — ABNORMAL LOW (ref 6.5–8.1)

## 2019-04-12 LAB — MAGNESIUM: Magnesium: 2.1 mg/dL (ref 1.7–2.4)

## 2019-04-12 LAB — CBC
HCT: 33.6 % — ABNORMAL LOW (ref 36.0–46.0)
Hemoglobin: 10.7 g/dL — ABNORMAL LOW (ref 12.0–15.0)
MCH: 29.2 pg (ref 26.0–34.0)
MCHC: 31.8 g/dL (ref 30.0–36.0)
MCV: 91.8 fL (ref 80.0–100.0)
Platelets: 186 10*3/uL (ref 150–400)
RBC: 3.66 MIL/uL — ABNORMAL LOW (ref 3.87–5.11)
RDW: 13.1 % (ref 11.5–15.5)
WBC: 5.1 10*3/uL (ref 4.0–10.5)
nRBC: 0 % (ref 0.0–0.2)

## 2019-04-12 MED ORDER — POTASSIUM CHLORIDE CRYS ER 20 MEQ PO TBCR
30.0000 meq | EXTENDED_RELEASE_TABLET | Freq: Two times a day (BID) | ORAL | Status: AC
  Administered 2019-04-12 (×2): 30 meq via ORAL
  Filled 2019-04-12 (×2): qty 1

## 2019-04-12 NOTE — Progress Notes (Addendum)
   Subjective:  Patient resting in chair. Denies any abdominal pain or acute changes overnight. No bowel movement since 10/29.    Objective:  Vital signs in last 24 hours: Vitals:   04/11/19 1451 04/11/19 2029 04/11/19 2212 04/12/19 0442  BP: 106/62 126/76 116/69 102/70  Pulse: 60   63  Resp: 17 20 16 18   Temp: 98.2 F (36.8 C) 97.9 F (36.6 C)  98.4 F (36.9 C)  TempSrc:  Oral  Oral  SpO2: 96% 95% 95% 97%  Weight:    86.3 kg  Height:      Physical Exam Constitutional:      General: She is not in acute distress.    Appearance: She is well-developed.  Cardiovascular:     Rate and Rhythm: Normal rate and regular rhythm.  Pulmonary:     Effort: No respiratory distress.     Breath sounds: Normal breath sounds. No wheezing or rhonchi.  Abdominal:     General: Bowel sounds are normal.     Tenderness: There is no abdominal tenderness.  Skin:    General: Skin is warm and dry.     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   Intra- abdominal abscess CT scan revealed 1.5 cm intraabdominal mass near the hepatic flexure. On day 2 of zosyn. Afebrile, WBC down trending , bilirubin down trending. No abdominal pain  On exam. No bowel movement in 4 days, will continue to monitor. - advance diet to Full liquid diet - Continue Zosyn, on day 2  Hypokalemia  Patient on Tikosyn for heart failure. Replete for K <4, Mg <2.  - replete K+  Dispo: Anticipated discharge with continue clinical improvement.Tamsen Snider, MD PGY1  [phone]

## 2019-04-13 ENCOUNTER — Telehealth: Payer: Self-pay | Admitting: Internal Medicine

## 2019-04-13 DIAGNOSIS — E876 Hypokalemia: Secondary | ICD-10-CM

## 2019-04-13 LAB — BASIC METABOLIC PANEL
Anion gap: 7 (ref 5–15)
BUN: 10 mg/dL (ref 6–20)
CO2: 27 mmol/L (ref 22–32)
Calcium: 8.9 mg/dL (ref 8.9–10.3)
Chloride: 104 mmol/L (ref 98–111)
Creatinine, Ser: 0.98 mg/dL (ref 0.44–1.00)
GFR calc Af Amer: >60 mL/min (ref >60)
GFR calc non Af Amer: >60 mL/min (ref >60)
Glucose, Bld: 96 mg/dL (ref 70–99)
Potassium: 4.1 mmol/L (ref 3.5–5.1)
Sodium: 138 mmol/L (ref 135–145)

## 2019-04-13 LAB — CBC
HCT: 33.5 % — ABNORMAL LOW (ref 36.0–46.0)
Hemoglobin: 10.6 g/dL — ABNORMAL LOW (ref 12.0–15.0)
MCH: 29 pg (ref 26.0–34.0)
MCHC: 31.6 g/dL (ref 30.0–36.0)
MCV: 91.5 fL (ref 80.0–100.0)
Platelets: 210 10*3/uL (ref 150–400)
RBC: 3.66 MIL/uL — ABNORMAL LOW (ref 3.87–5.11)
RDW: 13 % (ref 11.5–15.5)
WBC: 5.3 10*3/uL (ref 4.0–10.5)
nRBC: 0 % (ref 0.0–0.2)

## 2019-04-13 MED ORDER — AMOXICILLIN-POT CLAVULANATE 875-125 MG PO TABS: 1.0000 | ORAL_TABLET | Freq: Two times a day (BID) | ORAL | 0 refills | Status: AC

## 2019-04-13 NOTE — Discharge Summary (Addendum)
Name: [name] MRN: [id number] DOB: Dec 10, 1965 53 y.o. PCP: Bartholomew Crews, MD  Date of Admission: 04/10/2019 12:55 PM Date of Discharge:  Attending Physician: Lucious Groves, DO  Discharge Diagnosis: 1. Intra-abdominal Abscess 2. Hyperbilirubinemia 3. Hypokalemia  Discharge Medications: Allergies as of 04/13/2019   No Known Allergies     Medication List    TAKE these medications   amoxicillin-clavulanate 875-125 MG tablet Commonly known as: Augmentin Take 1 tablet by mouth 2 (two) times daily for 4 days.   apixaban 5 MG Tabs tablet Commonly known as: ELIQUIS Take 5 mg by mouth 2 (two) times daily.   fluticasone 50 MCG/ACT nasal spray Commonly known as: FLONASE Place 2 sprays into both nostrils daily as needed for allergies or rhinitis.   furosemide 20 MG tablet Commonly known as: LASIX Take 20 mg by mouth See admin instructions. TAKE 1 TABLET (20 MG TOTAL) BY MOUTH DAILY. Can take additional 20 mg as needed for weight gain   loratadine 10 MG tablet Commonly known as: CLARITIN Take 10 mg by mouth daily as needed for allergies or rhinitis.   metoprolol succinate 50 MG 24 hr tablet Commonly known as: TOPROL-XL Take 50 mg by mouth at bedtime.   sertraline 100 MG tablet Commonly known as: ZOLOFT Take 50 mg by mouth daily.   Tikosyn 500 MCG capsule Generic drug: dofetilide Take 500 mcg by mouth 2 (two) times daily.       Disposition and follow-up:   [name] was discharged from [facility] in Stable condition.  At the hospital follow up visit please address:  1.  Intra-abdominal abscess       - no clear source for infection  - Treated for 3 days of IV Zosyn and sent out on Augmentin to finish total of 7 days of antibiotics.   - asymptomatic on day of discharge, evaluate symptoms on hospital follow up.   2.  Labs / imaging needed at time of follow-up: n/a  3.  Pending labs/ test needing follow-up: n/a  Follow-up  Appointments:   Hospital Course by problem list: Ms. 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 Patient was started on 5 day course of antibiotic therapy. Patient's infection markers (WBC, fever) all resolved during admission and patient was discharged to complete course at home.     1. Intra-abdominal abscess  Patient presented to [facility] ED complaining of right sided abdominal pain since Monday specifically in the RUQ and RLQ. Patient additionally noted abdominal distention/nausea/decreased oral intake for the prior 24 hours. On presentation, patient was afebrile with mild leukocytosis with pain on palpation of affected area. CT scan revealed 1.5 cm intra-abdominal abscess near the right hepatic flexure. Surgery consulted who recommended conservative antibiotic therapy. Patient started on broad spectrum Rocephin-Metronidazole in ED that was modified to Zosyn upon admission. Leukocytosis resolved and pain improved by11/1. Patient completing day 3 of antibiotics on day of discharge. On discharge switching patient to Augmentin BID with instructions to continue taking for 7 days completing therapy on  Friday 11/6.   2. Hyperbilirubinemia  On presentation, patient noted to have elevated bilirubin of 2.1 (indirect 1.6). Patient has previous history cholecystectomy. Hyperbilirubinemia resolved. Given clinical  picture of acute illness and resolution, this hyperbilirubinemia not concerning.    3. Hypokalemia  Patient noted to have hypokalemia on 11/1 of 3.3 Instructions per Cardiologist due to taking Tikosyn are to maintain K values >4. K+ repleted; following day values were 4.1. Patient's cardiologist tracks  potassium values. Instructed patient to notify her cardiologist.  Discharge Vitals:   BP 105/68 (BP Location: Left Arm)   Pulse (!) 59   Temp 98.2 F (36.8 C) (Oral)   Resp 18   Ht 5\' 6"  (1.676 m)   Wt 85.2 kg   LMP 10/06/2011   SpO2 95%   BMI 30.31 kg/m   Pertinent Labs, Studies, and Procedures:   BMP Latest Ref Rng & Units 04/13/2019 04/12/2019 04/11/2019  Glucose 70 - 99 mg/dL 96 77 111(H)  BUN 6 - 20 mg/dL 10 14 9   Creatinine 0.44 - 1.00 mg/dL 0.98 1.02(H) 0.96  Sodium 135 - 145 mmol/L 138 138 139  Potassium 3.5 - 5.1 mmol/L 4.1 3.3(L) 3.8  Chloride 98 - 111 mmol/L 104 104 105  CO2 22 - 32 mmol/L 27 23 25   Calcium 8.9 - 10.3 mg/dL 8.9 8.8(L) 8.8(L)   CMP Latest Ref Rng & Units 04/13/2019 04/12/2019 04/11/2019  Glucose 70 - 99 mg/dL 96 77 111(H)  BUN 6 - 20 mg/dL 10 14 9   Creatinine 0.44 - 1.00 mg/dL 0.98 1.02(H) 0.96  Sodium 135 - 145 mmol/L 138 138 139  Potassium 3.5 - 5.1 mmol/L 4.1 3.3(L) 3.8  Chloride 98 - 111 mmol/L 104 104 105  CO2 22 - 32 mmol/L 27 23 25   Calcium 8.9 - 10.3 mg/dL 8.9 8.8(L) 8.8(L)  Total Protein 6.5 - 8.1 g/dL - 6.1(L) 6.0(L)  Total Bilirubin 0.3 - 1.2 mg/dL - 1.4(H) 2.1(H)  Alkaline Phos 38 - 126 U/L - 86 67  AST 15 - 41 U/L - 16 14(L)  ALT 0 - 44 U/L - 12 12     Discharge Instructions:   Signed:  Tamsen Snider, MD PGY1  [phone]

## 2019-04-13 NOTE — Progress Notes (Signed)
Nsg Discharge Note  Admit Date:  04/10/2019 Discharge date: 04/13/2019   Rush Farmer to be D/C'd Home per MD order.  AVS completed and dated. Patient able to verbalize understanding.  Discharge Medication: Allergies as of 04/13/2019   No Known Allergies     Medication List    TAKE these medications   amoxicillin-clavulanate 875-125 MG tablet Commonly known as: Augmentin Take 1 tablet by mouth 2 (two) times daily for 4 days.   apixaban 5 MG Tabs tablet Commonly known as: ELIQUIS Take 5 mg by mouth 2 (two) times daily.   fluticasone 50 MCG/ACT nasal spray Commonly known as: FLONASE Place 2 sprays into both nostrils daily as needed for allergies or rhinitis.   furosemide 20 MG tablet Commonly known as: LASIX Take 20 mg by mouth See admin instructions. TAKE 1 TABLET (20 MG TOTAL) BY MOUTH DAILY. Can take additional 20 mg as needed for weight gain   loratadine 10 MG tablet Commonly known as: CLARITIN Take 10 mg by mouth daily as needed for allergies or rhinitis.   metoprolol succinate 50 MG 24 hr tablet Commonly known as: TOPROL-XL Take 50 mg by mouth at bedtime.   sertraline 100 MG tablet Commonly known as: ZOLOFT Take 50 mg by mouth daily.   Tikosyn 500 MCG capsule Generic drug: dofetilide Take 500 mcg by mouth 2 (two) times daily.       Discharge Assessment: Vitals:   04/12/19 2109 04/13/19 0439  BP: 106/63 105/68  Pulse: 66 (!) 59  Resp: 18 18  Temp: 98.1 F (36.7 C) 98.2 F (36.8 C)  SpO2: 97% 95%   Skin clean, dry and intact without evidence of skin break down, no evidence of skin tears noted. IV catheter discontinued intact. Site without signs and symptoms of complications - no redness or edema noted at insertion site, patient denies c/o pain - only slight tenderness at site.  Dressing with slight pressure applied.  D/c Instructions-Education: Discharge instructions given to patient with verbalized understanding. D/c education completed with  patient including follow up instructions, medication list, d/c activities limitations if indicated, with other d/c instructions as indicated by MD - patient able to verbalize understanding, all questions fully answered. Patient instructed to return to ED, call 911, or call MD for any changes in condition.  Patient escorted via [place], and D/C home via private auto.  Hassan Rowan, RN 04/13/2019 10:44 AM

## 2019-04-13 NOTE — Telephone Encounter (Signed)
HFU per Dr Koleen Distance; pt appt 11/06 1015am/NW

## 2019-04-13 NOTE — Progress Notes (Signed)
Subjective:  Saw patient this morning during rounds. Patient doing very well. Abdominal pain is minimal. Denies nausea/vomiting/bloody bowel movements. Patient is looking forward to discharge. She would like to remain out of work till Saturday to finish her treatment and ensure she doesn't have any lingering abdominal pain.   Objective:  Vital signs in last 24 hours: Vitals:   04/12/19 1415 04/12/19 2000 04/12/19 2109 04/13/19 0439  BP: 100/61 110/71 106/63 105/68  Pulse: 61  66 (!) 59  Resp: 16  18 18   Temp: 98.6 F (37 C)  98.1 F (36.7 C) 98.2 F (36.8 C)  TempSrc: Oral  Oral Oral  SpO2: 96%  97% 95%  Weight:    85.2 kg  Height:       Weight change: -1.114 kg  Intake/Output Summary (Last 24 hours) at 04/13/2019 1041 Last data filed at 04/13/2019 0600 Gross per 24 hour  Intake 120 ml  Output 1000 ml  Net -880 ml     CBC Latest Ref Rng & Units 04/13/2019 04/12/2019 04/11/2019  WBC 4.0 - 10.5 K/uL 5.3 5.1 9.9  Hemoglobin 12.0 - 15.0 g/dL 10.6(L) 10.7(L) 11.2(L)  Hematocrit 36.0 - 46.0 % 33.5(L) 33.6(L) 34.7(L)  Platelets 150 - 400 K/uL 210 186 205   BMP Latest Ref Rng & Units 04/13/2019 04/12/2019 04/11/2019  Glucose 70 - 99 mg/dL 96 77 111(H)  BUN 6 - 20 mg/dL 10 14 9   Creatinine 0.44 - 1.00 mg/dL 0.98 1.02(H) 0.96  Sodium 135 - 145 mmol/L 138 138 139  Potassium 3.5 - 5.1 mmol/L 4.1 3.3(L) 3.8  Chloride 98 - 111 mmol/L 104 104 105  CO2 22 - 32 mmol/L 27 23 25   Calcium 8.9 - 10.3 mg/dL 8.9 8.8(L) 8.8(L)      Physical Exam  Constitutional: She is oriented to person, place, and time. She appears well-developed.  HENT:  Head: Normocephalic and atraumatic.  Eyes: Pupils are equal, round, and reactive to light. Conjunctivae and EOM are normal. No scleral icterus.  Cardiovascular: Normal rate, regular rhythm, S1 normal and S2 normal. Exam reveals no gallop and no friction rub.  No murmur heard. Pulmonary/Chest: Effort normal and breath sounds normal. No accessory  muscle usage. No respiratory distress.  Abdominal: Soft. Normal appearance and bowel sounds are normal. She exhibits no distension.  Mild RLQ tenderness that is greatly decreased from presentation in terms of severity and area eliciting pain  Musculoskeletal: Normal range of motion.        General: No tenderness or edema.  Neurological: She is alert and oriented to person, place, and time.  Skin: Skin is warm and dry.    Patient Summary [name] is an 53 y.o. 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  Patient completing day 3 of antibiotic therapy with Zosyn. Patient being switched to Augmentin for discharge.   Assessment/Plan:  Principal Problem:   Intra-abdominal abscess ([place]) -afebrile/normal WBC/bilirubin trending down/minimal abdominal pain all indicate resolving infection Plan: -discharge patient to complete 7 day antibiotic course -switching patient from Zosyn to Augmentin for discharge  Hypokalemia -resolved, patient's K was 4.1 this morning Plan: -patient's K values are followed by her cardiologist due to [place] for heart failure -instructed patient to notify cardiologist of latest values   LOS: 3 days   Carolyne Littles, Medical Student  04/13/2019, 10:41 AM

## 2019-04-13 NOTE — Discharge Instructions (Addendum)
Ms.Orebaugh,  Thank you for trusting [name] with your care. You were treated for a intraabdominal abscess without a cause for the infection. You improved with IV antibiotics. We will send you home to finish oral antibiotics and have a follow up with a gastroenterologist. You have an appointment in our clinic scheduled for this Friday 11/6 at 10:15  My best,  Tamsen Snider, MD

## 2019-04-17 ENCOUNTER — Encounter: Payer: Self-pay | Admitting: Internal Medicine

## 2019-04-17 ENCOUNTER — Other Ambulatory Visit: Payer: Self-pay

## 2019-04-17 ENCOUNTER — Ambulatory Visit: Payer: BC Managed Care – PPO | Admitting: Internal Medicine

## 2019-04-17 VITALS — BP 110/65 | HR 66 | Temp 98.3°F

## 2019-04-17 DIAGNOSIS — R350 Frequency of micturition: Secondary | ICD-10-CM

## 2019-04-17 DIAGNOSIS — K651 Peritoneal abscess: Secondary | ICD-10-CM | POA: Diagnosis not present

## 2019-04-17 DIAGNOSIS — M545 Low back pain, unspecified: Secondary | ICD-10-CM

## 2019-04-17 DIAGNOSIS — R197 Diarrhea, unspecified: Secondary | ICD-10-CM

## 2019-04-17 DIAGNOSIS — M549 Dorsalgia, unspecified: Secondary | ICD-10-CM | POA: Insufficient documentation

## 2019-04-17 LAB — POCT URINALYSIS DIPSTICK
Bilirubin, UA: NEGATIVE
Glucose, UA: NEGATIVE
Ketones, UA: NEGATIVE
Leukocytes, UA: NEGATIVE
Nitrite, UA: NEGATIVE
Protein, UA: NEGATIVE
Spec Grav, UA: 1.025 (ref 1.010–1.025)
Urobilinogen, UA: 0.2 E.U./dL
pH, UA: 5 (ref 5.0–8.0)

## 2019-04-17 NOTE — Telephone Encounter (Signed)
Pt was seen today in [facility] by Dr Isac Sarna.

## 2019-04-17 NOTE — Progress Notes (Signed)
   CC: Hospital follow-up  HPI:  Ms.Arlie L Nease is a 53 y.o. year-old female with PMH listed below who presents to clinic for hospital follow-up. Please see problem based assessment and plan for further details.   Past Medical History:  Diagnosis Date  . Abnormal thyroid blood test    Mildly elevated TSH, resolved.  . 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 Anson Fret to evaluate anemia; pathology showed a tubulovillous adenoma with no high grade dysplasia or malignancy identified.    . Anemia   . Anxiety    Acute stress reaction to multiple ICD shocks  . Appendicitis, acute, s/p apendectomy 01/17/2013  . Atrial fibrillation ([place])    S/P radiofrequency ablation by Dr. Adrian Prows at [facility] on 02/09/2008 and again on 03/31/2009; S/P ablation at [facility] by Dr. Westly Pam on 08/10/2010, followed by recurrent atrial flutter/fibrillation.  Cardiac cath done on 02/19/2011 at [facility] showed no significant coronary disease.  Patient underwent Convergent Procedure at [facility] on 03/07/2011.   . Atrial tachycardia [place])    Status post convergent ablation [facility] October 2012.  She underwent successful catheter ablation of persistent atrial tachycardia at [facility] by Dr. Willis Modena on 05/24/2014.    Marland Kitchen Cholecystitis, acute 07/2001   S/P laparoscopic cholecystectomy, intraoperative cholangiogram, and transcystic common bile duct exploration by Dr. Jackolyn Confer on 07/14/2001  . Dizziness   . Fibroids   . GERD (gastroesophageal reflux disease)   . GI bleed 05/25/2011  . Hyperglycemia   . HYPERGLYCEMIA 12/24/2008   Qualifier: Diagnosis of  By: Marinda Elk MD, Sonia Side    . Hypertrophic obstructive cardiomyopathy (HOCM) ([place])    TEE 09/02/2007 showed moderate to severe left ventricular hypertrophy with an interventricular septal dimension of 1.5 cm and posterior wall dimension of 7 cm; there was normal LV systolic function and estimated  EF of approximately 60%; there did not appear to be any obvious outflow tract obstruction.   . Pelvic mass    Pelvic ultrasound done on 11/30/10 to evaluate abdominal pain showed,  positioned between the ovaries and contiguous with the ovaries, a complex mass demonstrating posterior acoustical enhancement and no definite intralesional flow with color Doppler exam measuring 5.2 x 3.1 x 5.5 cm. The appearance raised the possibility of a ruptured ovarian cyst with subsequent development of a hematoma.   . Seasonal allergic rhinitis   . Ventricular tachycardia ([place])    Hx of nonsustained ventricular tachycardia, S/P hospitalization in March 2009; S/P ICD placement by Dr. Deboraha Sprang on 09/25/2007.   Review of Systems:   Review of Systems  Constitutional: Negative for chills, fever, malaise/fatigue and weight loss.  Gastrointestinal: Positive for diarrhea (Loose stools ). Negative for abdominal pain, blood in stool, constipation, melena, nausea and vomiting.       Abdominal bloating   Neurological: Negative for dizziness.     Physical Exam:  Vitals:   04/17/19 1026  BP: 110/65  Pulse: 66  Temp: 98.3 F (36.8 C)  TempSrc: Oral  SpO2: 100%    General: Well-appearing female in no acute distress Abd: soft, NTND, bowel sounds are normoactive    Assessment & Plan:   See Encounters Tab for problem based charting.  Patient discussed with Dr. Lynnae January

## 2019-04-17 NOTE — Assessment & Plan Note (Signed)
Patient presents complaining of acute left lower back pain in the paraspinal muscles area after being discharged from the hospital.  No red flag symptoms. Heating pad helps with the pain. We discussed this is likely secondary to laying down in an uncomfortable hospital bed without much mobilization for a few days and that it should resolve within the next few weeks. Recommended icy hot patches and continue heating pad.

## 2019-04-17 NOTE — Patient Instructions (Signed)
[name],   I am very happy to hear that you are feeling much better!  Please make sure to stop taking Augmentin as you completed your course this morning.  I expect your abdominal bloating to resolve within the next few days after stopping antibiotics but give [name] a call if it is not getting any better.  For your back pain you can use icy hot patches to keep the area warm and move around as much as you can.  It should get better within the next few days as well but again if it does not give [name] a call let us know.  - Dr. Frederico Hamman

## 2019-04-17 NOTE — Progress Notes (Signed)
Internal Medicine Clinic Attending  Case discussed with Dr. Santos-Sanchez at the time of the visit.  We reviewed the resident's history and exam and pertinent patient test results.  I agree with the assessment, diagnosis, and plan of care documented in the resident's note.    

## 2019-04-17 NOTE — Assessment & Plan Note (Signed)
Patient was admitted 10/30 - 11/2 for abdominal pain and was found to have an intra-abdominal abscess inferior to the hepatic flexure of unclear etiology as patient does not have any risk factors for intra-abdominal infection.  She was treated with IV Zosyn for 3 days and transition to PO Augmentin x 4 days for a total of 7 days of antibiotic therapy, which she completed today. She reports doing well since discharged. Her abdominal pain has resolved. She is experiencing some abdominal bloating and loose stools from the antibiotics and has been hesitant to advance her diet. Currently on a soft diet. Has good appetite. Discussed her symptoms should resolve in the next few days now that she has completed antibiotic therapy. No further intervention at this time. Advised to call us if abdominal pain recurs or if she developed infectious symptoms.

## 2019-08-03 ENCOUNTER — Other Ambulatory Visit: Payer: Self-pay | Admitting: Adult Health

## 2019-08-31 ENCOUNTER — Encounter: Payer: Self-pay | Admitting: Certified Nurse Midwife

## 2019-11-30 ENCOUNTER — Encounter: Payer: Self-pay | Admitting: *Deleted

## 2020-02-12 ENCOUNTER — Encounter: Payer: Self-pay | Admitting: Obstetrics and Gynecology

## 2020-03-23 ENCOUNTER — Other Ambulatory Visit: Payer: Self-pay | Admitting: Radiology

## 2020-04-25 ENCOUNTER — Ambulatory Visit: Payer: Self-pay | Admitting: Surgery

## 2020-04-25 DIAGNOSIS — D242 Benign neoplasm of left breast: Secondary | ICD-10-CM

## 2020-04-25 NOTE — H&P (Signed)
[name] Appointment: 04/25/2020 9:40 AM Location: [facility] Patient #: [id number] DOB: 01/20/1966 Single / Language: [name] / Race: White Female  History of Present Illness [name] A. Mary [name]; 04/25/2020 1:01 PM) Patient words: Patient sent at the request of [name] Dr. Luan Gaines for evaluation of abnormal screening and subsequent diagnostic mammogram. A density was noted at the 12 o'clock position left breast 8 cm deep to the nipple. 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 Patient denies any history of nipple discharge, breast mass for breast pain. She gets routine screening. Of note she does have a left upper chest defibrillator. Denies any recent episodes of chest pain or shortness of breath.  The patient is a 54 year old female.   Past Surgical History [name], [place]; 04/25/2020 9:47 AM) Appendectomy Breast Biopsy Left. Colon Polyp Removal - Colonoscopy Gallbladder Surgery - Laparoscopic  Diagnostic Studies History [name], [place]; 04/25/2020 9:47 AM) Colonoscopy 5-10 years ago Mammogram within last year Pap Smear 1-5 years ago  Allergies [name], Gaines; 04/25/2020 9:48 AM) No Known Drug Allergies [04/25/2020]: Allergies Reconciled  Medication History [name], [place]; 04/25/2020 9:49 AM) Eliquis (5MG  Tablet, Oral) Active. Sertraline HCl (100MG  Tablet, Oral) Active. Furosemide (20MG  Tablet, Oral) Active. Metoprolol Succinate ER (50MG  Tablet ER 24HR, Oral) Active. Tikosyn (500MCG Capsule, Oral) Active. Loratadine (10MG  Capsule, Oral) Active. Medications Reconciled  Social History [name], [place]; 04/25/2020 9:47 AM) Alcohol use Occasional alcohol use. Caffeine use Coffee, Tea. No drug use Tobacco use Never smoker.  Family History [name], [place]; 04/25/2020 9:47 AM) Arthritis Father, Sister. Colon Polyps Mother, Sister. Heart Disease  Father, Mother, Sister. Heart disease in female family member before age 15 Heart disease in female family member before age 59 Migraine Headache Mother. Thyroid problems Mother.  Pregnancy / Birth History [name], [place]; 04/25/2020 9:47 AM) Age at menarche 68 years. Age of menopause 39-50 Contraceptive History Oral contraceptives. Gravida 0 Para 0  Other Problems [name], [place]; 04/25/2020 9:47 AM) Anxiety Disorder Atrial Fibrillation Cholelithiasis Gastroesophageal Reflux Disease     Review of Systems [name]; 04/25/2020 9:47 AM) General Not Present- Appetite Loss, Chills, Fatigue, Fever, Night Sweats, Weight Gain and Weight Loss. Skin Not Present- Change in Wart/Mole, Dryness, Hives, Jaundice, New Lesions, Non-Healing Wounds, Rash and Ulcer. HEENT Present- Seasonal Allergies. Not Present- Earache, Hearing Loss, Hoarseness, Nose Bleed, Oral Ulcers, Ringing in the Ears, Sinus Pain, Sore Throat, Visual Disturbances, Wears glasses/contact lenses and Yellow Eyes. Respiratory Not Present- Bloody sputum, Chronic Cough, Difficulty Breathing, Snoring and Wheezing. Breast Present- Breast Mass. Not Present- Breast Pain, Nipple Discharge and Skin Changes. Cardiovascular Not Present- Chest Pain, Difficulty Breathing Lying Down, Leg Cramps, Palpitations, Rapid Heart Rate, Shortness of Breath and Swelling of Extremities. Gastrointestinal Not Present- Abdominal Pain, Bloating, Bloody Stool, Change in Bowel Habits, Chronic diarrhea, Constipation, Difficulty Swallowing, Excessive gas, Gets full quickly at meals, Hemorrhoids, Indigestion, Nausea, Rectal Pain and Vomiting. Female Genitourinary Not Present- Frequency, Nocturia, Painful Urination, Pelvic Pain and Urgency. Musculoskeletal Not Present- Back Pain, Joint Pain, Joint Stiffness, Muscle Pain, Muscle Weakness and Swelling of Extremities. Neurological Not Present- Decreased Memory, Fainting, Headaches, Numbness,  Seizures, Tingling, Tremor, Trouble walking and Weakness. Psychiatric Not Present- Anxiety, Bipolar, Change in Sleep Pattern, Depression, Fearful and Frequent crying. Endocrine Not Present- Cold Intolerance, Excessive Hunger, Hair Changes, Heat Intolerance, Hot flashes and New Diabetes. Hematology Present- Blood Thinners. Not Present- Easy Bruising, Excessive bleeding, Gland problems, HIV and Persistent  Infections.  Vitals [name]; 04/25/2020 9:50 AM) 04/25/2020 9:49 AM Weight: 187.5 lb Height: 65in Body Surface Area: 1.92 m Body Mass Index: 31.2 kg/m  Temp.: 98.3F  Pulse: 88 (Regular)  P.OX: 95% (Room air) BP: 110/64(Sitting, Left Arm, Standard)        Physical Exam ([name] A. Mary Kanady Gaines; 04/25/2020 1:02 PM)  General Mental Status-Alert. General Appearance-Consistent with stated age. Hydration-Well hydrated. Voice-Normal.  Chest and Lung Exam Chest and lung exam reveals -quiet, even and easy respiratory effort with no use of accessory muscles and on auscultation, normal breath sounds, no adventitious sounds and normal vocal resonance. Inspection Chest Wall - Normal. Back - normal.  Breast Note: Bruising noted left breast. No masses noted bilaterally.  Cardiovascular Note: Left upper chest device noted. Regular rate and rhythm.  Neurologic Neurologic evaluation reveals -alert and oriented x 3 with no impairment of recent or remote memory. Mental Status-Normal.  Musculoskeletal Normal Exam - Left-Upper Extremity Strength Normal and Lower Extremity Strength Normal. Normal Exam - Right-Upper Extremity Strength Normal and Lower Extremity Strength Normal.  Lymphatic Head & Neck  General Head & Neck Lymphatics: Bilateral - Description - Normal. Axillary  General Axillary Region: Bilateral - Description - Normal. Tenderness - Non Tender.    Assessment & Plan ([name]; 04/25/2020 1:04 PM)  PAPILLOMA OF LEFT  BREAST (D24.2) Impression: Discussed the pros and cons of lumpectomy discussed potential upgrade risk of up to 10-20% in the circumstances with core biopsy. Patient opted for left breast SEED localized lumpectomy. Risk of lumpectomy include bleeding, infection, seroma, more surgery, use of seed/wire, wound care, cosmetic deformity and the need for other treatments, death , blood clots, death. Pt agrees to proceed.  Total time 30 minutes  Current Plans You are being scheduled for surgery- Our schedulers will call you.  You should hear from our office's scheduling department within 5 working days about the location, date, and time of surgery. We try to make accommodations for patient's preferences in scheduling surgery, but sometimes the OR schedule or the surgeon's schedule prevents [name] from making those accommodations.  If you have not heard from our office [phone]) in 5 working days, call the office and ask for your surgeon's nurse.  If you have other questions about your diagnosis, plan, or surgery, call the office and ask for your surgeon's nurse.  Pt Education - [facility] Breast Biopsy HCI: discussed with patient and provided information.

## 2020-04-25 NOTE — H&P (Signed)
[name] Appointment: 04/25/2020 9:40 AM Location: [facility] Patient #: [id number] DOB: 29-Aug-1965 Single / Language: [name] / Race: White Female  History of Present Illness Mary Moores A. Jakaylah Schlafer MD; 04/25/2020 1:01 PM) Patient words: Patient sent at the request of [facility] Dr. Luan Gaines for evaluation of abnormal screening and subsequent diagnostic mammogram. A density was noted at the 12 o'clock position left breast 8 cm deep to the nipple. 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 Patient denies any history of nipple discharge, breast mass for breast pain. She gets routine screening. Of note she does have a left upper chest defibrillator. Denies any recent episodes of chest pain or shortness of breath.  The patient is a 54 year old female.   Past Surgical History [name], [place]; 04/25/2020 9:47 AM) Appendectomy Breast Biopsy Left. Colon Polyp Removal - Colonoscopy Gallbladder Surgery - Laparoscopic  Diagnostic Studies History [name], [place]; 04/25/2020 9:47 AM) Colonoscopy 5-10 years ago Mammogram within last year Pap Smear 1-5 years ago  Allergies [name], RMA; 04/25/2020 9:48 AM) No Known Drug Allergies [04/25/2020]: Allergies Reconciled  Medication History [name], [place]; 04/25/2020 9:49 AM) Eliquis (5MG  Tablet, Oral) Active. Sertraline HCl (100MG  Tablet, Oral) Active. Furosemide (20MG  Tablet, Oral) Active. Metoprolol Succinate ER (50MG  Tablet ER 24HR, Oral) Active. Tikosyn (500MCG Capsule, Oral) Active. Loratadine (10MG  Capsule, Oral) Active. Medications Reconciled  Social History [name], [place]; 04/25/2020 9:47 AM) Alcohol use Occasional alcohol use. Caffeine use Coffee, Tea. No drug use Tobacco use Never smoker.  Family History [name], [place]; 04/25/2020 9:47 AM) Arthritis Father, Sister. Colon Polyps Mother, Sister. Heart Disease  Father, Mother, Sister. Heart disease in female family member before age 66 Heart disease in female family member before age 1 Migraine Headache Mother. Thyroid problems Mother.  Pregnancy / Birth History [name], [place]; 04/25/2020 9:47 AM) Age at menarche 56 years. Age of menopause 49-50 Contraceptive History Oral contraceptives. Gravida 0 Para 0  Other Problems [name], [place]; 04/25/2020 9:47 AM) Anxiety Disorder Atrial Fibrillation Cholelithiasis Gastroesophageal Reflux Disease     Review of Systems [name] RMA; 04/25/2020 9:47 AM) General Not Present- Appetite Loss, Chills, Fatigue, Fever, Night Sweats, Weight Gain and Weight Loss. Skin Not Present- Change in Wart/Mole, Dryness, Hives, Jaundice, New Lesions, Non-Healing Wounds, Rash and Ulcer. HEENT Present- Seasonal Allergies. Not Present- Earache, Hearing Loss, Hoarseness, Nose Bleed, Oral Ulcers, Ringing in the Ears, Sinus Pain, Sore Throat, Visual Disturbances, Wears glasses/contact lenses and Yellow Eyes. Respiratory Not Present- Bloody sputum, Chronic Cough, Difficulty Breathing, Snoring and Wheezing. Breast Present- Breast Mass. Not Present- Breast Pain, Nipple Discharge and Skin Changes. Cardiovascular Not Present- Chest Pain, Difficulty Breathing Lying Down, Leg Cramps, Palpitations, Rapid Heart Rate, Shortness of Breath and Swelling of Extremities. Gastrointestinal Not Present- Abdominal Pain, Bloating, Bloody Stool, Change in Bowel Habits, Chronic diarrhea, Constipation, Difficulty Swallowing, Excessive gas, Gets full quickly at meals, Hemorrhoids, Indigestion, Nausea, Rectal Pain and Vomiting. Female Genitourinary Not Present- Frequency, Nocturia, Painful Urination, Pelvic Pain and Urgency. Musculoskeletal Not Present- Back Pain, Joint Pain, Joint Stiffness, Muscle Pain, Muscle Weakness and Swelling of Extremities. Neurological Not Present- Decreased Memory, Fainting, Headaches, Numbness,  Seizures, Tingling, Tremor, Trouble walking and Weakness. Psychiatric Not Present- Anxiety, Bipolar, Change in Sleep Pattern, Depression, Fearful and Frequent crying. Endocrine Not Present- Cold Intolerance, Excessive Hunger, Hair Changes, Heat Intolerance, Hot flashes and New Diabetes. Hematology Present- Blood Thinners. Not Present- Easy Bruising, Excessive bleeding, Gland problems, HIV and Persistent  Infections.  Vitals [name] RMA; 04/25/2020 9:50 AM) 04/25/2020 9:49 AM Weight: 187.5 lb Height: 65in Body Surface Area: 1.92 m Body Mass Index: 31.2 kg/m  Temp.: 98.17F  Pulse: 88 (Regular)  P.OX: 95% (Room air) BP: 110/64(Sitting, Left Arm, Standard)        Physical Exam ([name] A. Mary Zamarripa MD; 04/25/2020 1:02 PM)  General Mental Status-Alert. General Appearance-Consistent with stated age. Hydration-Well hydrated. Voice-Normal.  Chest and Lung Exam Chest and lung exam reveals -quiet, even and easy respiratory effort with no use of accessory muscles and on auscultation, normal breath sounds, no adventitious sounds and normal vocal resonance. Inspection Chest Wall - Normal. Back - normal.  Breast Note: Bruising noted left breast. No masses noted bilaterally.  Cardiovascular Note: Left upper chest device noted. Regular rate and rhythm.  Neurologic Neurologic evaluation reveals -alert and oriented x 3 with no impairment of recent or remote memory. Mental Status-Normal.  Musculoskeletal Normal Exam - Left-Upper Extremity Strength Normal and Lower Extremity Strength Normal. Normal Exam - Right-Upper Extremity Strength Normal and Lower Extremity Strength Normal.  Lymphatic Head & Neck  General Head & Neck Lymphatics: Bilateral - Description - Normal. Axillary  General Axillary Region: Bilateral - Description - Normal. Tenderness - Non Tender.    Assessment & Plan ([name] A. Mary Leazer MD; 04/25/2020 1:03 PM)  PAPILLOMA OF LEFT  BREAST (D24.2) Impression: Discussed the pros and cons of lumpectomy discussed potential upgrade risk of up to 10-20% in the circumstances with core biopsy. Patient opted for left breast SEED localized lumpectomy. Risk of lumpectomy include bleeding, infection, seroma, more surgery, use of seed/wire, wound care, cosmetic deformity and the need for other treatments, death , blood clots, death. Pt agrees to proceed.  Current Plans You are being scheduled for surgery- Our schedulers will call you.  You should hear from our office's scheduling department within 5 working days about the location, date, and time of surgery. We try to make accommodations for patient's preferences in scheduling surgery, but sometimes the OR schedule or the surgeon's schedule prevents [name] from making those accommodations.  If you have not heard from our office [phone]) in 5 working days, call the office and ask for your surgeon's nurse.  If you have other questions about your diagnosis, plan, or surgery, call the office and ask for your surgeon's nurse.  Pt Education - [facility] Breast Biopsy HCI: discussed with patient and provided information.

## 2020-05-26 ENCOUNTER — Ambulatory Visit (INDEPENDENT_AMBULATORY_CARE_PROVIDER_SITE_OTHER): Payer: BC Managed Care – PPO | Admitting: Internal Medicine

## 2020-05-26 ENCOUNTER — Encounter: Payer: Self-pay | Admitting: Internal Medicine

## 2020-05-26 ENCOUNTER — Other Ambulatory Visit: Payer: Self-pay

## 2020-05-26 VITALS — BP 117/74 | HR 69 | Temp 97.1°F | Ht 65.0 in | Wt 182.8 lb

## 2020-05-26 DIAGNOSIS — I421 Obstructive hypertrophic cardiomyopathy: Secondary | ICD-10-CM

## 2020-05-26 DIAGNOSIS — I480 Paroxysmal atrial fibrillation: Secondary | ICD-10-CM | POA: Diagnosis not present

## 2020-05-26 DIAGNOSIS — I5042 Chronic combined systolic (congestive) and diastolic (congestive) heart failure: Secondary | ICD-10-CM | POA: Diagnosis not present

## 2020-05-26 DIAGNOSIS — D242 Benign neoplasm of left breast: Secondary | ICD-10-CM | POA: Insufficient documentation

## 2020-05-26 HISTORY — DX: Benign neoplasm of left breast: D24.2

## 2020-05-27 LAB — LIPID PANEL
Chol/HDL Ratio: 5.2 ratio — ABNORMAL HIGH (ref 0.0–4.4)
Cholesterol, Total: 198 mg/dL (ref 100–199)
HDL: 38 mg/dL — ABNORMAL LOW (ref >39)
LDL Chol Calc (NIH): 144 mg/dL — ABNORMAL HIGH (ref 0–99)
Triglycerides: 85 mg/dL (ref 0–149)
VLDL Cholesterol Cal: 16 mg/dL (ref 5–40)

## 2020-05-30 NOTE — Assessment & Plan Note (Signed)
HPI: Recent screening mammography was abnormal underwent biopsy which I do not have the results available in front of me at this time but she reports that it was an intraductal papilloma of the left breast.  She is following currently with Dr. Brantley Stage and plans to have a lumpectomy due to her cardiac history she needs cardiac clearance first turned part of this work-up she had an echocardiogram that revealed an ASD for which she will need repair before cardiac clearance is given.  Assessment intraductal papilloma of left breast  Plan Eventual plan for lumpectomy after cardiac clearance

## 2020-05-30 NOTE — Assessment & Plan Note (Signed)
HPI: This past medical history of chronic combined congestive heart failure she follows with [facility] cardiology.  She denies any worsening dyspnea on exertion or orthopnea feels she is doing well overall.  Assessment chronic combined congestive heart failure  Plan Torsemide 40 mg daily as needed Metoprolol succinate 50 mg daily

## 2020-05-30 NOTE — Assessment & Plan Note (Signed)
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  She has continued plans for follow-up with cardiology and discussions about the optimal approach for closure of this ASD.  Assessment hypertrophic obstructive cardiomyopathy with ASD  Plan Continued management by [facility] cardiology

## 2020-05-30 NOTE — Progress Notes (Signed)
  Subjective:  HPI: [name] is a 54 y.o. female who presents for f/u Hypertrophic obstructive cardiomyopathy, cCHF/ meet new PCP  Please see Assessment and Plan below for the status of her chronic medical problems.  Objective:  Physical Exam: Vitals:   05/26/20 0948  BP: 117/74  Pulse: 69  Temp: (!) 97.1 F (36.2 C)  TempSrc: Oral  SpO2: 96%  Weight: 182 lb 12.8 oz (82.9 kg)  Height: 5\' 5"  (1.651 m)   Body mass index is 30.42 kg/m. Physical Exam Vitals and nursing note reviewed.  Constitutional:      Appearance: Normal appearance.  Cardiovascular:     Rate and Rhythm: Normal rate and regular rhythm.  Pulmonary:     Effort: Pulmonary effort is normal.     Breath sounds: Normal breath sounds.  Musculoskeletal:     Right lower leg: No edema.     Left lower leg: No edema.  Neurological:     Mental Status: She is alert.    Assessment & Plan:  See Encounters Tab for problem based charting.  Medications Ordered No orders of the defined types were placed in this encounter.  Other Orders Orders Placed This Encounter  Procedures  . Lipid Profile   Follow Up: Return in about 1 year (around 05/26/2021), or if symptoms worsen or fail to improve.

## 2020-05-30 NOTE — Assessment & Plan Note (Signed)
HPI: She has a complex past medical history including atrial fibrillation as well as various atrial and ventricular tachycardias.  She has had multiple ablation procedures and follows with EP at [facility].  She currently is on Tikosyn and metoprolol with Eliquis for rate control.  Assessment atrial fibrillation  Plan Continue metoprolol succinate, Eliquis and Tikosyn

## 2020-07-02 ENCOUNTER — Other Ambulatory Visit (HOSPITAL_COMMUNITY): Payer: BC Managed Care – PPO

## 2020-07-04 ENCOUNTER — Other Ambulatory Visit (HOSPITAL_COMMUNITY)
Admission: RE | Admit: 2020-07-04 | Discharge: 2020-07-04 | Disposition: A | Discharge status: Home / Self Care | Payer: BC Managed Care – PPO | Source: Ambulatory Visit | Attending: Surgery | Admitting: Surgery

## 2020-07-04 DIAGNOSIS — Z01812 Encounter for preprocedural laboratory examination: Secondary | ICD-10-CM | POA: Insufficient documentation

## 2020-07-04 DIAGNOSIS — N6082 Other benign mammary dysplasias of left breast: Secondary | ICD-10-CM | POA: Diagnosis not present

## 2020-07-04 DIAGNOSIS — Z20822 Contact with and (suspected) exposure to covid-19: Secondary | ICD-10-CM | POA: Insufficient documentation

## 2020-07-04 DIAGNOSIS — D242 Benign neoplasm of left breast: Secondary | ICD-10-CM | POA: Diagnosis present

## 2020-07-04 DIAGNOSIS — Z9049 Acquired absence of other specified parts of digestive tract: Secondary | ICD-10-CM | POA: Diagnosis not present

## 2020-07-04 DIAGNOSIS — N6012 Diffuse cystic mastopathy of left breast: Secondary | ICD-10-CM | POA: Diagnosis not present

## 2020-07-04 LAB — SARS CORONAVIRUS 2 (TAT 6-24 HRS): SARS Coronavirus 2: NEGATIVE

## 2020-07-04 NOTE — Progress Notes (Addendum)
Per patient, she has a dual ICD and pacemaker St. Jude device, managed by Stark Klein at [facility]. Faxed Request for device orders to [facility] @ [phone].

## 2020-07-04 NOTE — Progress Notes (Signed)
[id number] - [place], [place] - [name] [place] [address] Phone: [phone] Fax: [phone]     Your procedure is scheduled on Thursday, 07/07/20 (3:30 PM- 4:30 PM).  Report to [facility] Main Entrance "A" at 1:30 P.M., and check in at the Admitting office.  Call this number if you have problems the morning of surgery:  [phone]  Call [phone] if you have any questions prior to your surgery date Monday-Friday 8am-4pm.    Remember:  Do not eat after midnight the night before your surgery.  You may drink clear liquids until 12:om PM the day of your surgery.   Clear liquids allowed are: Water, Non-Citrus Juices (without pulp), Carbonated Beverages, Clear Tea, Black Coffee Only, and Gatorade.     Take these medicines the morning of surgery with A SIP OF WATER:  dofetilide (TIKOSYN) sertraline (ZOLOFT) acetaminophen (TYLENOL) - if needed fluticasone (FLONASE) nasal spray- if needed   *Follow your surgeon's instructions on when to stop apixaban (ELIQUIS).  If no instructions were given by your surgeon then you will need to call the office to get those instructions.     As of today, STOP taking any Aspirin (unless otherwise instructed by your surgeon) Aleve, Naproxen, Ibuprofen, Motrin, Advil, Goody's, BC's, all herbal medications, fish oil, and all vitamins.        The Morning of Surgery:               Do not wear jewelry, make up, or nail polish.            Do not wear lotions, powders, perfumes, or deodorant.            Do not shave 48 hours prior to surgery.              Do not bring valuables to the hospital.            [facility] is not responsible for any belongings or valuables.  Do NOT Smoke (Tobacco/Vaping) or drink Alcohol 24 hours prior to your procedure If you use a CPAP at night, you may bring all equipment for your overnight stay.   Contacts, glasses, dentures or bridgework  may not be worn into surgery.      For patients admitted to the hospital, discharge time will be determined by your treatment team.   Patients discharged the day of surgery will not be allowed to drive home, and someone needs to stay with them for 24 hours.    Special instructions:   [place]- Preparing For Surgery  Before surgery, you can play an important role. Because skin is not sterile, your skin needs to be as free of germs as possible. You can reduce the number of germs on your skin by washing with CHG (chlorahexidine gluconate) Soap before surgery.  CHG is an antiseptic cleaner which kills germs and bonds with the skin to continue killing germs even after washing.    Oral Hygiene is also important to reduce your risk of infection.  Remember - BRUSH YOUR TEETH THE MORNING OF SURGERY WITH YOUR REGULAR TOOTHPASTE  Please do not use if you have an allergy to CHG or antibacterial soaps. If your skin becomes reddened/irritated stop using the CHG.  Do not shave (including legs and underarms) for at least 48 hours prior to first CHG shower. It is OK to shave your face.  Please follow these instructions carefully.  1. Shower the NIGHT BEFORE SURGERY and the MORNING OF SURGERY with CHG Soap.   2. If you chose to wash your hair, wash your hair first as usual with your normal shampoo.  3. After you shampoo, rinse your hair and body thoroughly to remove the shampoo.  4. Use CHG as you would any other liquid soap. You can apply CHG directly to the skin and wash gently with a scrungie or a clean washcloth.   5. Apply the CHG Soap to your body ONLY FROM THE NECK DOWN.  Do not use on open wounds or open sores. Avoid contact with your eyes, ears, mouth and genitals (private parts). Wash Face and genitals (private parts)  with your normal soap.   6. Wash thoroughly, paying special attention to the area where your surgery will be performed.  7. Thoroughly rinse your body with warm water from  the neck down.  8. DO NOT shower/wash with your normal soap after using and rinsing off the CHG Soap.  9. Pat yourself dry with a CLEAN TOWEL.  10. Wear CLEAN PAJAMAS to bed the night before surgery  11. Place CLEAN SHEETS on your bed the night of your first shower and DO NOT SLEEP WITH PETS.   Day of Surgery: SHOWER as above. Wear Clean/Comfortable clothing the morning of surgery Do not apply any deodorants/lotions.   Remember to brush your teeth WITH YOUR REGULAR TOOTHPASTE.   Please read over the following fact sheets that you were given.

## 2020-07-05 ENCOUNTER — Other Ambulatory Visit: Payer: Self-pay

## 2020-07-05 ENCOUNTER — Encounter (HOSPITAL_COMMUNITY): Payer: Self-pay

## 2020-07-05 ENCOUNTER — Encounter (HOSPITAL_COMMUNITY)
Admission: RE | Admit: 2020-07-05 | Discharge: 2020-07-05 | Disposition: A | Discharge status: Home / Self Care | Payer: BC Managed Care – PPO | Source: Ambulatory Visit | Attending: Surgery | Admitting: Surgery

## 2020-07-05 DIAGNOSIS — Z01812 Encounter for preprocedural laboratory examination: Secondary | ICD-10-CM | POA: Insufficient documentation

## 2020-07-05 DIAGNOSIS — I422 Other hypertrophic cardiomyopathy: Secondary | ICD-10-CM | POA: Insufficient documentation

## 2020-07-05 DIAGNOSIS — D242 Benign neoplasm of left breast: Secondary | ICD-10-CM

## 2020-07-05 DIAGNOSIS — Z95810 Presence of automatic (implantable) cardiac defibrillator: Secondary | ICD-10-CM | POA: Insufficient documentation

## 2020-07-05 DIAGNOSIS — I4891 Unspecified atrial fibrillation: Secondary | ICD-10-CM | POA: Insufficient documentation

## 2020-07-05 DIAGNOSIS — Z7901 Long term (current) use of anticoagulants: Secondary | ICD-10-CM | POA: Insufficient documentation

## 2020-07-05 HISTORY — DX: Family history of other specified conditions: Z84.89

## 2020-07-05 HISTORY — DX: Heart failure, unspecified: I50.9

## 2020-07-05 HISTORY — DX: Cardiac murmur, unspecified: R01.1

## 2020-07-05 HISTORY — DX: Presence of cardiac pacemaker: Z95.0

## 2020-07-05 HISTORY — DX: Other specified postprocedural states: Z98.890

## 2020-07-05 HISTORY — DX: Nausea with vomiting, unspecified: R11.2

## 2020-07-05 LAB — CBC WITH DIFFERENTIAL/PLATELET
Abs Immature Granulocytes: 0.02 10*3/uL (ref 0.00–0.07)
Basophils Absolute: 0 10*3/uL (ref 0.0–0.1)
Basophils Relative: 1 %
Eosinophils Absolute: 0.1 10*3/uL (ref 0.0–0.5)
Eosinophils Relative: 1 %
HCT: 43.6 % (ref 36.0–46.0)
Hemoglobin: 14.3 g/dL (ref 12.0–15.0)
Immature Granulocytes: 0 %
Lymphocytes Relative: 13 %
Lymphs Abs: 0.9 10*3/uL (ref 0.7–4.0)
MCH: 28.8 pg (ref 26.0–34.0)
MCHC: 32.8 g/dL (ref 30.0–36.0)
MCV: 87.9 fL (ref 80.0–100.0)
Monocytes Absolute: 0.6 10*3/uL (ref 0.1–1.0)
Monocytes Relative: 8 %
Neutro Abs: 5.4 10*3/uL (ref 1.7–7.7)
Neutrophils Relative %: 77 %
Platelets: 271 10*3/uL (ref 150–400)
RBC: 4.96 MIL/uL (ref 3.87–5.11)
RDW: 13.2 % (ref 11.5–15.5)
WBC: 7 10*3/uL (ref 4.0–10.5)
nRBC: 0 % (ref 0.0–0.2)

## 2020-07-05 LAB — COMPREHENSIVE METABOLIC PANEL
ALT: 24 U/L (ref 0–44)
AST: 37 U/L (ref 15–41)
Albumin: 4.6 g/dL (ref 3.5–5.0)
Alkaline Phosphatase: 76 U/L (ref 38–126)
Anion gap: 12 (ref 5–15)
BUN: 31 mg/dL — ABNORMAL HIGH (ref 6–20)
CO2: 29 mmol/L (ref 22–32)
Calcium: 10.2 mg/dL (ref 8.9–10.3)
Chloride: 97 mmol/L — ABNORMAL LOW (ref 98–111)
Creatinine, Ser: 1.09 mg/dL — ABNORMAL HIGH (ref 0.44–1.00)
GFR, Estimated: 60 mL/min — ABNORMAL LOW (ref >60)
Glucose, Bld: 88 mg/dL (ref 70–99)
Potassium: 3.8 mmol/L (ref 3.5–5.1)
Sodium: 138 mmol/L (ref 135–145)
Total Bilirubin: 1.5 mg/dL — ABNORMAL HIGH (ref 0.3–1.2)
Total Protein: 7.8 g/dL (ref 6.5–8.1)

## 2020-07-05 NOTE — Progress Notes (Signed)
PCP - Dr. Joni Reining Cardiologist/Electrophysiologist - Dr. Stark Klein ([place]) Heart Failure - Dr. Princella Pellegrini  PPM/ICD - Yes Device Orders - faxed on 07/04/2020 - see prior note Rep Notified - Spoke with Shearon Stalls on 07/05/2020 via telephone @1017   Chest x-ray - N/A EKG - per patient done on 06/15/2020, the doctor's name is Dr. Celine Ahr - tracing requested Stress Test - per patient about 30 years ago ECHO - 05/17/2020 ([facility] Cardiac Cath - 06/21/2020) ([facility]  Sleep Study - denies CPAP - N/A  DM: denies  Blood Thinner Instructions: ELIQUIS: last dose was on 07/04/2020 at 10PM Aspirin Instructions: N/A  ERAS Protcol - Yes PRE-SURGERY Ensure or G2- None ordered  COVID TEST- Tested on 07/04/2020, negative results. Patient verbalized understanding of self-quarantine instructions.   Anesthesia review: YES, cardiac hx, EKG tracing requested  Patient denies shortness of breath, fever, cough and chest pain at PAT appointment  All instructions explained to the patient, with a verbal understanding of the material. Patient agrees to go over the instructions while at home for a better understanding. Patient also instructed to self quarantine after being tested for COVID-19. The opportunity to ask questions was provided.

## 2020-07-06 ENCOUNTER — Encounter (HOSPITAL_COMMUNITY): Payer: Self-pay | Admitting: Surgery

## 2020-07-06 NOTE — Anesthesia Preprocedure Evaluation (Addendum)
Anesthesia Evaluation  Patient identified by MRN, date of birth, ID band Patient awake    Reviewed: Allergy & Precautions, H&P , NPO status , Patient's Chart, lab work & pertinent test results  History of Anesthesia Complications (+) PONV, Family history of anesthesia reaction and history of anesthetic complications  Airway Mallampati: II  TM Distance: >3 FB Neck ROM: Full    Dental no notable dental hx. (+) Teeth Intact, Dental Advisory Given   Pulmonary neg pulmonary ROS,    Pulmonary exam normal breath sounds clear to auscultation       Cardiovascular +CHF  + dysrhythmias Atrial Fibrillation and Ventricular Tachycardia + pacemaker + Cardiac Defibrillator + Valvular Problems/Murmurs  Rhythm:Regular Rate:Normal  Hx/o HOCM Hx/o Atrial fibrillation and PAT S/P ablation x 5 Hx/o VT S/P AICD placement  EKG at [facility] Atrial paced rhythm with prolonged AV conduction, LVH, LAD        Neuro/Psych Anxiety negative neurological ROS     GI/Hepatic Neg liver ROS, GERD  Medicated,  Endo/Other  negative endocrine ROSObesity Intraductal papilloma left breast  Renal/GU negative Renal ROS  negative genitourinary   Musculoskeletal negative musculoskeletal ROS (+)   Abdominal   Peds  Hematology  (+) Blood dyscrasia, anemia , Eliquis Therapy- last dose 07/04/20   Anesthesia Other Findings   Reproductive/Obstetrics negative OB ROS                            Anesthesia Physical Anesthesia Plan  ASA: III  Anesthesia Plan: General   Post-op Pain Management:    Induction: Intravenous  PONV Risk Score and Plan: 4 or greater and Treatment may vary due to age or medical condition, Midazolam, Scopolamine patch - Pre-op, Dexamethasone and Ondansetron  Airway Management Planned: LMA  Additional Equipment:   Intra-op Plan:   Post-operative Plan: Extubation in OR  Informed Consent: I have reviewed  the patients History and Physical, chart, labs and discussed the procedure including the risks, benefits and alternatives for the proposed anesthesia with the patient or authorized representative who has indicated his/her understanding and acceptance.     Dental advisory given  Plan Discussed with: CRNA  Anesthesia Plan Comments: (PAT note by Karoline Caldwell, PA-C: Follows with cardiology at [facility] for history of hypertrophic cardiomyopathy (no evidence of obstruction on LHC), HFpEF (EF 45% by TEE 12/21), regional wall motion abnormalities on echo (cath showed no evidence of CAD), AF/Atach s/p ablation 2015, s/p Saint Jude ICD placement over 10 years ago with generator change out 2015 (reportedly last discharge in 2012 for atrial fibrillation but no ICD therapy since that time).  Recently underwent right heart catheter evaluation of possible ASD.  Cath done 03-21-2021 showed small ASD with some moderate left to right shunting on color Doppler but without evidence of right-to-left shunting on agitated saline bubble study medical management recommended.  Cardiac clearance dated 05/24/2020 states," Ms. Pryce is intermediate risk patient for intermediate risk surgery.  Cardiac conditions optimized: Yes.  Cardiac symptoms present: Baseline shortness of breath which is significantly improved with change in diuretic regimen.   Summary: No further cardiac testing recommended before proceeding with planned surgery.  Left heart cath 12/2019 no flow-limiting coronary artery disease.  Medications: Recommend perioperative beta-blocker therapy.  Patient is currently on apixaban.  Recommend discontinuation of medication 2 days prior to surgery and resuming per surgeon."  Patient reported last dose Eliquis 07/04/2020:  Perioperative device form has been sent to EP cardiologist and rep  has been notified.  Preop labs reviewed, unremarkable.  EKG 04/19/20 (Care Everywhere): Atrial paced rhythm with prolonged AV  conduction.  Rate 61.  Left axis deviation.  Left ventricular hypertrophy with QRS widening and repolarization normality.  Lateral infarct, age undetermined.  When compared with ECG of 12-16-19, electronic atrial pacemaker has replaced electronic ventricular pacemaker.  CHEST - 2 VIEW 08/19/18: COMPARISON:  01/17/2013 chest radiograph  FINDINGS: Cardiomegaly and LEFT pacemaker/ICD again noted.  There is no evidence of focal airspace disease, pulmonary edema, suspicious pulmonary nodule/mass, pleural effusion, or pneumothorax.  No acute bony abnormalities are identified.  IMPRESSION: Cardiomegaly without evidence of acute cardiopulmonary disease.  Right heart cath 06/21/2020 (Care Everywhere): Findings:   Normal right heart filling pressures   Mild pulmonary hypertension   Elevated left heart filling pressures   Normal cardiac output/index   Small ASD with some minor left to right shunting on color doppler but  without evidence of right to left shunting on agitated saline bubble study  (with and without valvsavla maneuvering)   Recommendations:  Medical Management   Follow up with primary cardiologist    TEE 05/17/20 (Care Everywhere): Summary  1. The left ventricular systolic function is mildly decreased, LVEF is  visually estimated at 45%.  2. The right ventricle is normal in size, with normal systolic function.  3. Atrial septal defect is visualized and there is demonstrated left to  right shunting by color flow Doppler.  4. There is mild to moderate pulmonic regurgitation.    TTE 05/10/2020 (Care Everywhere): Summary  1. The left ventricular systolic function is mildly decreased, LVEF is  visually estimated at 45%.  2. The right ventricle is normal in size, with normal systolic function.  3. No evidence of an interatrial communication or intrapulmonary shunt.  4. Agitated saline study is negative.   Cath 12/19/19 (Care Everywhere): Findings:   1. No flow limiting coronary artery disease.  2. Mildly elevated; left ventricular filling pressures (LVEDP = 17 mm Hg).  3. No LV pressure gradient   Recommendations:   F/u primary cardiologist  )      Anesthesia Quick Evaluation

## 2020-07-06 NOTE — Progress Notes (Signed)
Anesthesia Chart Review:  Follows with cardiology at [facility] for history of hypertrophic cardiomyopathy (no evidence of obstruction on LHC), HFpEF (EF 45% by TEE 12/21), regional wall motion abnormalities on echo (cath showed no evidence of CAD), AF/Atach s/p ablation 2015, s/p Saint Jude ICD placement over 10 years ago with generator change out 2015 (reportedly last discharge in 2012 for atrial fibrillation but no ICD therapy since that time).  Recently underwent right heart catheter evaluation of possible ASD.  Cath done 03-21-2021 showed small ASD with some moderate left to right shunting on color Doppler but without evidence of right-to-left shunting on agitated saline bubble study medical management recommended.  Cardiac clearance dated 05/24/2020 states," Ms. Courser is intermediate risk patient for intermediate risk surgery.  Cardiac conditions optimized: Yes.  Cardiac symptoms present: Baseline shortness of breath which is significantly improved with change in diuretic regimen.   Summary: No further cardiac testing recommended before proceeding with planned surgery.  Left heart cath 12/2019 no flow-limiting coronary artery disease.  Medications: Recommend perioperative beta-blocker therapy.  Patient is currently on apixaban.  Recommend discontinuation of medication 2 days prior to surgery and resuming per surgeon."  Patient reported last dose Eliquis 07/04/2020:  Perioperative device form has been sent to EP cardiologist and rep has been notified.  Preop labs reviewed, unremarkable.  EKG 04/19/20 (Care Everywhere): Atrial paced rhythm with prolonged AV conduction.  Rate 61.  Left axis deviation.  Left ventricular hypertrophy with QRS widening and repolarization normality.  Lateral infarct, age undetermined.  When compared with ECG of 12-16-19, electronic atrial pacemaker has replaced electronic ventricular pacemaker.  CHEST - 2 VIEW 08/19/18: COMPARISON:  01/17/2013 chest  radiograph  FINDINGS: Cardiomegaly and LEFT pacemaker/ICD again noted.  There is no evidence of focal airspace disease, pulmonary edema, suspicious pulmonary nodule/mass, pleural effusion, or pneumothorax.  No acute bony abnormalities are identified.  IMPRESSION: Cardiomegaly without evidence of acute cardiopulmonary disease.  Right heart cath 06/21/2020 (Care Everywhere): Findings:   Normal right heart filling pressures   Mild pulmonary hypertension   Elevated left heart filling pressures   Normal cardiac output/index   Small ASD with some minor left to right shunting on color doppler but  without evidence of right to left shunting on agitated saline bubble study  (with and without valvsavla maneuvering)   Recommendations:  Medical Management   Follow up with primary cardiologist    TEE 05/17/20 (Care Everywhere): Summary  1. The left ventricular systolic function is mildly decreased, LVEF is  visually estimated at 45%.  2. The right ventricle is normal in size, with normal systolic function.  3. Atrial septal defect is visualized and there is demonstrated left to  right shunting by color flow Doppler.  4. There is mild to moderate pulmonic regurgitation.    TTE 05/10/2020 (Care Everywhere): Summary  1. The left ventricular systolic function is mildly decreased, LVEF is  visually estimated at 45%.  2. The right ventricle is normal in size, with normal systolic function.  3. No evidence of an interatrial communication or intrapulmonary shunt.  4. Agitated saline study is negative.   Cath 12/19/19 (Care Everywhere): Findings:  1. No flow limiting coronary artery disease.  2. Mildly elevated; left ventricular filling pressures (LVEDP = 17 mm Hg).  3. No LV pressure gradient   Recommendations:   F/u primary cardiologist    Karoline Caldwell, PA-C [facility]/Anesthesiology Phone [phone] 07/06/2020 8:59 AM

## 2020-07-07 ENCOUNTER — Encounter (HOSPITAL_COMMUNITY): Payer: Self-pay | Admitting: Surgery

## 2020-07-07 ENCOUNTER — Other Ambulatory Visit: Payer: Self-pay

## 2020-07-07 ENCOUNTER — Encounter (HOSPITAL_COMMUNITY)
Admission: RE | Disposition: A | Discharge status: Home / Self Care | Payer: Self-pay | Source: Home / Self Care | Attending: Surgery

## 2020-07-07 ENCOUNTER — Ambulatory Visit (HOSPITAL_COMMUNITY)
Admission: RE | Admit: 2020-07-07 | Discharge: 2020-07-07 | Disposition: A | Discharge status: Home / Self Care | Payer: BC Managed Care – PPO | Attending: Surgery | Admitting: Surgery

## 2020-07-07 ENCOUNTER — Ambulatory Visit (HOSPITAL_COMMUNITY): Payer: BC Managed Care – PPO | Admitting: Anesthesiology

## 2020-07-07 DIAGNOSIS — Z9049 Acquired absence of other specified parts of digestive tract: Secondary | ICD-10-CM | POA: Insufficient documentation

## 2020-07-07 DIAGNOSIS — Z20822 Contact with and (suspected) exposure to covid-19: Secondary | ICD-10-CM | POA: Insufficient documentation

## 2020-07-07 DIAGNOSIS — D242 Benign neoplasm of left breast: Secondary | ICD-10-CM | POA: Insufficient documentation

## 2020-07-07 DIAGNOSIS — N6082 Other benign mammary dysplasias of left breast: Secondary | ICD-10-CM | POA: Insufficient documentation

## 2020-07-07 DIAGNOSIS — N6012 Diffuse cystic mastopathy of left breast: Secondary | ICD-10-CM | POA: Insufficient documentation

## 2020-07-07 HISTORY — PX: BREAST LUMPECTOMY WITH RADIOACTIVE SEED LOCALIZATION: SHX6424

## 2020-07-07 SURGERY — BREAST LUMPECTOMY WITH RADIOACTIVE SEED LOCALIZATION
Anesthesia: General | Site: Breast | Laterality: Left

## 2020-07-07 MED ORDER — PHENYLEPHRINE HCL-NACL 10-0.9 MG/250ML-% IV SOLN
INTRAVENOUS | Status: DC | PRN
  Administered 2020-07-07: 25 ug/min via INTRAVENOUS

## 2020-07-07 MED ORDER — BUPIVACAINE HCL (PF) 0.25 % IJ SOLN
INTRAMUSCULAR | Status: AC
  Filled 2020-07-07: qty 30

## 2020-07-07 MED ORDER — PROPOFOL 10 MG/ML IV BOLUS
INTRAVENOUS | Status: DC | PRN
  Administered 2020-07-07: 100 mg via INTRAVENOUS
  Administered 2020-07-07: 50 mg via INTRAVENOUS
  Administered 2020-07-07: 20 mg via INTRAVENOUS

## 2020-07-07 MED ORDER — ORAL CARE MOUTH RINSE: 15.0000 mL | Freq: Once | OROMUCOSAL | Status: AC

## 2020-07-07 MED ORDER — DEXAMETHASONE SODIUM PHOSPHATE 10 MG/ML IJ SOLN
INTRAMUSCULAR | Status: DC | PRN
  Administered 2020-07-07: 5 mg via INTRAVENOUS

## 2020-07-07 MED ORDER — PROPOFOL 10 MG/ML IV BOLUS
INTRAVENOUS | Status: AC
  Filled 2020-07-07: qty 20

## 2020-07-07 MED ORDER — LIDOCAINE 2% (20 MG/ML) 5 ML SYRINGE
INTRAMUSCULAR | Status: DC | PRN
  Administered 2020-07-07: 60 mg via INTRAVENOUS

## 2020-07-07 MED ORDER — CEFAZOLIN SODIUM-DEXTROSE 2-4 GM/100ML-% IV SOLN
2.0000 g | INTRAVENOUS | Status: AC
  Administered 2020-07-07: 2 g via INTRAVENOUS
  Filled 2020-07-07: qty 100

## 2020-07-07 MED ORDER — HYDROCODONE-ACETAMINOPHEN 5-325 MG PO TABS: 1.0000 | ORAL_TABLET | Freq: Four times a day (QID) | ORAL | 0 refills | Status: DC | PRN

## 2020-07-07 MED ORDER — BUPIVACAINE HCL (PF) 0.25 % IJ SOLN
INTRAMUSCULAR | Status: DC | PRN
  Administered 2020-07-07: 10 mL

## 2020-07-07 MED ORDER — ACETAMINOPHEN 500 MG PO TABS
1000.0000 mg | ORAL_TABLET | ORAL | Status: AC
  Administered 2020-07-07: 1000 mg via ORAL
  Filled 2020-07-07: qty 2

## 2020-07-07 MED ORDER — CHLORHEXIDINE GLUCONATE CLOTH 2 % EX PADS: 6.0000 | MEDICATED_PAD | Freq: Once | CUTANEOUS | Status: DC

## 2020-07-07 MED ORDER — FENTANYL CITRATE (PF) 250 MCG/5ML IJ SOLN
INTRAMUSCULAR | Status: AC
  Filled 2020-07-07: qty 5

## 2020-07-07 MED ORDER — DEXAMETHASONE SODIUM PHOSPHATE 10 MG/ML IJ SOLN
INTRAMUSCULAR | Status: AC
  Filled 2020-07-07: qty 1

## 2020-07-07 MED ORDER — MIDAZOLAM HCL 5 MG/5ML IJ SOLN
INTRAMUSCULAR | Status: DC | PRN
  Administered 2020-07-07: 1 mg via INTRAVENOUS

## 2020-07-07 MED ORDER — CHLORHEXIDINE GLUCONATE 0.12 % MT SOLN
15.0000 mL | Freq: Once | OROMUCOSAL | Status: AC
  Administered 2020-07-07: 15 mL via OROMUCOSAL
  Filled 2020-07-07: qty 15

## 2020-07-07 MED ORDER — FENTANYL CITRATE (PF) 250 MCG/5ML IJ SOLN
INTRAMUSCULAR | Status: DC | PRN
  Administered 2020-07-07 (×2): 50 ug via INTRAVENOUS

## 2020-07-07 MED ORDER — SUGAMMADEX SODIUM 200 MG/2ML IV SOLN
INTRAVENOUS | Status: DC | PRN
  Administered 2020-07-07: 200 mg via INTRAVENOUS

## 2020-07-07 MED ORDER — LACTATED RINGERS IV SOLN: INTRAVENOUS | Status: DC

## 2020-07-07 MED ORDER — ROCURONIUM BROMIDE 10 MG/ML (PF) SYRINGE
PREFILLED_SYRINGE | INTRAVENOUS | Status: AC
  Filled 2020-07-07: qty 10

## 2020-07-07 MED ORDER — 0.9 % SODIUM CHLORIDE (POUR BTL) OPTIME
TOPICAL | Status: DC | PRN
  Administered 2020-07-07: 1000 mL

## 2020-07-07 MED ORDER — ONDANSETRON HCL 4 MG/2ML IJ SOLN
INTRAMUSCULAR | Status: DC | PRN
  Administered 2020-07-07: 4 mg via INTRAVENOUS

## 2020-07-07 MED ORDER — SCOPOLAMINE 1 MG/3DAYS TD PT72
MEDICATED_PATCH | TRANSDERMAL | Status: DC | PRN
  Administered 2020-07-07: 1 via TRANSDERMAL

## 2020-07-07 MED ORDER — ROCURONIUM BROMIDE 10 MG/ML (PF) SYRINGE
PREFILLED_SYRINGE | INTRAVENOUS | Status: DC | PRN
  Administered 2020-07-07: 40 mg via INTRAVENOUS

## 2020-07-07 MED ORDER — MIDAZOLAM HCL 2 MG/2ML IJ SOLN
INTRAMUSCULAR | Status: AC
  Filled 2020-07-07: qty 2

## 2020-07-07 MED ORDER — ONDANSETRON HCL 4 MG/2ML IJ SOLN
INTRAMUSCULAR | Status: AC
  Filled 2020-07-07: qty 2

## 2020-07-07 MED ORDER — LIDOCAINE 2% (20 MG/ML) 5 ML SYRINGE
INTRAMUSCULAR | Status: AC
  Filled 2020-07-07: qty 5

## 2020-07-07 SURGICAL SUPPLY — 42 items
ADH SKN CLS APL DERMABOND .7 (GAUZE/BANDAGES/DRESSINGS) ×1
APL PRP STRL LF DISP 70% ISPRP (MISCELLANEOUS) ×1
APPLIER CLIP 9.375 MED OPEN (MISCELLANEOUS)
APR CLP MED 9.3 20 MLT OPN (MISCELLANEOUS)
BINDER BREAST LRG (GAUZE/BANDAGES/DRESSINGS) IMPLANT
BINDER BREAST XLRG (GAUZE/BANDAGES/DRESSINGS) ×1 IMPLANT
CANISTER SUCT 3000ML PPV (MISCELLANEOUS) IMPLANT
CHLORAPREP W/TINT 26 (MISCELLANEOUS) ×2 IMPLANT
CLIP APPLIE 9.375 MED OPEN (MISCELLANEOUS) IMPLANT
COVER PROBE W GEL 5X96 (DRAPES) ×2 IMPLANT
COVER SURGICAL LIGHT HANDLE (MISCELLANEOUS) ×2 IMPLANT
COVER WAND RF STERILE (DRAPES) ×1 IMPLANT
DERMABOND ADVANCED (GAUZE/BANDAGES/DRESSINGS) ×1
DERMABOND ADVANCED .7 DNX12 (GAUZE/BANDAGES/DRESSINGS) ×1 IMPLANT
DEVICE DUBIN SPECIMEN MAMMOGRA (MISCELLANEOUS) ×2 IMPLANT
DRAPE CHEST BREAST 15X10 FENES (DRAPES) ×2 IMPLANT
ELECT CAUTERY BLADE 6.4 (BLADE) ×2 IMPLANT
ELECT REM PT RETURN 9FT ADLT (ELECTROSURGICAL) ×2
ELECTRODE REM PT RTRN 9FT ADLT (ELECTROSURGICAL) ×1 IMPLANT
GLOVE BIO SURGEON STRL SZ8 (GLOVE) ×2 IMPLANT
GLOVE SRG 8 PF TXTR STRL LF DI (GLOVE) ×1 IMPLANT
GLOVE SURG UNDER POLY LF SZ8 (GLOVE) ×2
GOWN STRL REUS W/ TWL LRG LVL3 (GOWN DISPOSABLE) ×1 IMPLANT
GOWN STRL REUS W/ TWL XL LVL3 (GOWN DISPOSABLE) ×1 IMPLANT
GOWN STRL REUS W/TWL LRG LVL3 (GOWN DISPOSABLE) ×2
GOWN STRL REUS W/TWL XL LVL3 (GOWN DISPOSABLE) ×2
KIT BASIN OR (CUSTOM PROCEDURE TRAY) ×2 IMPLANT
KIT MARKER MARGIN INK (KITS) IMPLANT
LIGHT WAVEGUIDE WIDE FLAT (MISCELLANEOUS) IMPLANT
NDL HYPO 25GX1X1/2 BEV (NEEDLE) IMPLANT
NEEDLE HYPO 25GX1X1/2 BEV (NEEDLE) ×2 IMPLANT
NS IRRIG 1000ML POUR BTL (IV SOLUTION) IMPLANT
PACK GENERAL/GYN (CUSTOM PROCEDURE TRAY) ×2 IMPLANT
SUT MNCRL AB 4-0 PS2 18 (SUTURE) ×2 IMPLANT
SUT SILK 2 0 SH (SUTURE) IMPLANT
SUT VIC AB 2-0 SH 27 (SUTURE) ×2
SUT VIC AB 2-0 SH 27XBRD (SUTURE) IMPLANT
SUT VIC AB 3-0 SH 27 (SUTURE)
SUT VIC AB 3-0 SH 27X BRD (SUTURE) IMPLANT
SUT VIC AB 3-0 SH 8-18 (SUTURE) ×2 IMPLANT
SYR CONTROL 10ML LL (SYRINGE) ×1 IMPLANT
TOWEL GREEN STERILE FF (TOWEL DISPOSABLE) IMPLANT

## 2020-07-07 NOTE — Interval H&P Note (Signed)
History and Physical Interval Note:  07/07/2020 2:29 PM  [name]  has presented today for surgery, with the diagnosis of LEFT BREAST PAPILLOMA.  The various methods of treatment have been discussed with the patient and family. After consideration of risks, benefits and other options for treatment, the patient has consented to  Procedure(s): LEFT BREAST LUMPECTOMY WITH RADIOACTIVE SEED LOCALIZATION (Left) as a surgical intervention.  The patient's history has been reviewed, patient examined, no change in status, stable for surgery.  I have reviewed the patient's chart and labs.  Questions were answered to the patient's satisfaction.     [place]

## 2020-07-07 NOTE — Progress Notes (Signed)
ICD ON, by rep, rate 60  / programming complete

## 2020-07-07 NOTE — Progress Notes (Signed)
Pt placed on monitor.  

## 2020-07-07 NOTE — Anesthesia Postprocedure Evaluation (Signed)
Anesthesia Post Note  Patient: [name]  Procedure(s) Performed: LEFT BREAST LUMPECTOMY WITH RADIOACTIVE SEED LOCALIZATION (Left Breast)     Patient location during evaluation: PACU Anesthesia Type: General Level of consciousness: awake and alert Pain management: pain level controlled Vital Signs Assessment: post-procedure vital signs reviewed and stable Respiratory status: spontaneous breathing, nonlabored ventilation and respiratory function stable Cardiovascular status: blood pressure returned to baseline and stable Postop Assessment: no apparent nausea or vomiting Anesthetic complications: no   No complications documented.  Last Vitals:  Vitals:   07/07/20 1246 07/07/20 1548  BP: 118/73 100/82  Pulse: 62 64  Resp: 18 12  Temp: (!) 36.4 C 36.7 C  SpO2: 97% 100%    Last Pain:  Vitals:   07/07/20 1258  TempSrc:   PainSc: 0-No pain                 Aryianna Earwood,W. EDMOND

## 2020-07-07 NOTE — Op Note (Signed)
Preoperative diagnosis: Left breast papilloma  Postoperative diagnosis: Same   Procedure: Left breast seed localized lumpectomy  Surgeon: Erroll Luna M.D.  Anesthesia: Gen. With 0.25% Sensorcaine local  EBL: 20 cc  Specimen: Left breast tissue with clip and radioactive seed in the specimen. Verified with neoprobe and radiographic image showing both seed and clip in specimen  Indications for procedure: The patient presents for left breast excisional lumpectomy after core biopsy showed papilloma. Discussed the rationale for considering excision. Small risk of malignancy associated with papilloma lesion after core biopsy. Discussed observation. Discussed wire localization. Patient desired excision of left breast papilloma.The procedure has been discussed with the patient. Alternatives to surgery have been discussed with the patient.  Risks of surgery include bleeding,  Infection,  Seroma formation, death,  and the need for further surgery.   The patient understands and wishes to proceed.   Description of procedure: Patient underwent seed placement as an outpatient. Patient presents today for left breast seed localized lumpectomy. Patient and holding area. Questions are answered and neoprobe used to verify seed location. Patient taken back to the operating room and placed upon the OR table. After induction of general anesthesia, left breast prepped and draped in a sterile fashion. Timeout was done to verify proper sizing procedure. Neoprobe used and hot spot identified and left breast upper-outer quadrant. This was marked with pen. Curvilinear incision made left upper outer quadrant breast. Dissection used with the help of a neoprobe around the tissue where the seed and clip were located. Tissue removed in its entirety with gross  Negative margins.. Neoprobe used and seed within specimen.Additional lateral margins was taken with had the clip.  Radiographs taken which show clip and seed  In  specimen.Hemostasis achieved and cavity closed with 3-0 Vicryl and 4-0 Monocryl. Dermabond applied. All final counts found to be correct. Specimen transported to pathology. Patient awoke extubated taken to recovery in satisfactory condition.

## 2020-07-07 NOTE — H&P (Signed)
[name]  Location: [facility] Patient #: [id number] DOB: 08/20/1965 Single / Language: [name] / Race: White Female  History of Present Illness  Patient words: Patient sent at the request of [facility] Dr. Luan Pulling for evaluation of abnormal screening and subsequent diagnostic mammogram. A density was noted at the 12 o'clock position left breast 8 cm deep to the nipple. 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 Patient denies any history of nipple discharge, breast mass for breast pain. She gets routine screening. Of note she does have a left upper chest defibrillator. Denies any recent episodes of chest pain or shortness of breath.  The patient is a 55 year old female.   Past Surgical History Appendectomy Breast Biopsy Left. Colon Polyp Removal - Colonoscopy Gallbladder Surgery - Laparoscopic  Diagnostic Studies History ( AM) Colonoscopy 5-10 years ago Mammogram within last year Pap Smear 1-5 years ago  Allergies Darden Palmer, RMA; 04/25/2020 9:48 AM) No Known Drug Allergies [04/25/2020]: Allergies Reconciled  Medication History  Eliquis (5MG  Tablet, Oral) Active. Sertraline HCl (100MG  Tablet, Oral) Active. Furosemide (20MG  Tablet, Oral) Active. Metoprolol Succinate ER (50MG  Tablet ER 24HR, Oral) Active. Tikosyn (500MCG Capsule, Oral) Active. Loratadine (10MG  Capsule, Oral) Active. Medications Reconciled  Social History Alcohol use Occasional alcohol use. Caffeine use Coffee, Tea. No drug use Tobacco use Never smoker.  Family History  Arthritis Father, Sister. Colon Polyps Mother, Sister. Heart Disease Father, Mother, Sister. Heart disease in female family member before age 37 Heart disease in female family member before age 39 Migraine Headache Mother. Thyroid problems Mother.  Pregnancy / Birth History   AM) Age at menarche 1 years. Age of  menopause 51-50 Contraceptive History Oral contraceptives. Gravida 0 Para 0  Other Problems  Anxiety Disorder Atrial Fibrillation Cholelithiasis Gastroesophageal Reflux Disease     Review of Systems  General Not Present- Appetite Loss, Chills, Fatigue, Fever, Night Sweats, Weight Gain and Weight Loss. Skin Not Present- Change in Wart/Mole, Dryness, Hives, Jaundice, New Lesions, Non-Healing Wounds, Rash and Ulcer. HEENT Present- Seasonal Allergies. Not Present- Earache, Hearing Loss, Hoarseness, Nose Bleed, Oral Ulcers, Ringing in the Ears, Sinus Pain, Sore Throat, Visual Disturbances, Wears glasses/contact lenses and Yellow Eyes. Respiratory Not Present- Bloody sputum, Chronic Cough, Difficulty Breathing, Snoring and Wheezing. Breast Present- Breast Mass. Not Present- Breast Pain, Nipple Discharge and Skin Changes. Cardiovascular Not Present- Chest Pain, Difficulty Breathing Lying Down, Leg Cramps, Palpitations, Rapid Heart Rate, Shortness of Breath and Swelling of Extremities. Gastrointestinal Not Present- Abdominal Pain, Bloating, Bloody Stool, Change in Bowel Habits, Chronic diarrhea, Constipation, Difficulty Swallowing, Excessive gas, Gets full quickly at meals, Hemorrhoids, Indigestion, Nausea, Rectal Pain and Vomiting. Female Genitourinary Not Present- Frequency, Nocturia, Painful Urination, Pelvic Pain and Urgency. Musculoskeletal Not Present- Back Pain, Joint Pain, Joint Stiffness, Muscle Pain, Muscle Weakness and Swelling of Extremities. Neurological Not Present- Decreased Memory, Fainting, Headaches, Numbness, Seizures, Tingling, Tremor, Trouble walking and Weakness. Psychiatric Not Present- Anxiety, Bipolar, Change in Sleep Pattern, Depression, Fearful and Frequent crying. Endocrine Not Present- Cold Intolerance, Excessive Hunger, Hair Changes, Heat Intolerance, Hot flashes and New Diabetes. Hematology Present- Blood Thinners. Not Present- Easy Bruising, Excessive  bleeding, Gland problems, HIV and Persistent Infections.  Vitals  04/25/2020 9:49 AM Weight: 187.5 lb Height: 65in Body Surface Area: 1.92 m Body Mass Index: 31.2 kg/m  Temp.: 98.9F  Pulse: 88 (Regular)  P.OX: 95% (Room air) BP: 110/64(Sitting, Left Arm, Standard)        Physical Exam  General Mental Status-Alert. General Appearance-Consistent with stated age. Hydration-Well hydrated. Voice-Normal.  Chest and Lung Exam Chest and lung exam reveals -quiet, even and easy respiratory effort with no use of accessory muscles and on auscultation, normal breath sounds, no adventitious sounds and normal vocal resonance. Inspection Chest Wall - Normal. Back - normal.  Breast Note: Bruising noted left breast. No masses noted bilaterally.  Cardiovascular Note: Left upper chest device noted. Regular rate and rhythm.  Neurologic Neurologic evaluation reveals -alert and oriented x 3 with no impairment of recent or remote memory. Mental Status-Normal.  Musculoskeletal Normal Exam - Left-Upper Extremity Strength Normal and Lower Extremity Strength Normal. Normal Exam - Right-Upper Extremity Strength Normal and Lower Extremity Strength Normal.  Lymphatic Head & Neck  General Head & Neck Lymphatics: Bilateral - Description - Normal. Axillary  General Axillary Region: Bilateral - Description - Normal. Tenderness - Non Tender.    Assessment & Plan   PAPILLOMA OF LEFT BREAST (D24.2) Impression: Discussed the pros and cons of lumpectomy discussed potential upgrade risk of up to 10-20% in the circumstances with core biopsy. Patient opted for left breast SEED localized lumpectomy. Risk of lumpectomy include bleeding, infection, seroma, more surgery, use of seed/wire, wound care, cosmetic deformity and the need for other treatments, death , blood clots, death. Pt agrees to proceed.  Current Plans You are being scheduled for  surgery- Our schedulers will call you.  You should hear from our office's scheduling department within 5 working days about the location, date, and time of surgery. We try to make accommodations for patient's preferences in scheduling surgery, but sometimes the OR schedule or the surgeon's schedule prevents [name] from making those accommodations.  If you have not heard from our office [phone]) in 5 working days, call the office and ask for your surgeon's nurse.  If you have other questions about your diagnosis, plan, or surgery, call the office and ask for your surgeon's nurse.  Pt Education - [facility] Breast Biopsy HCI: discussed with patient and provided information.

## 2020-07-07 NOTE — Progress Notes (Signed)
Defib pads in place

## 2020-07-07 NOTE — Progress Notes (Signed)
Spoke with [place] rep- will be here shortly to assess and evaluate device.

## 2020-07-07 NOTE — Transfer of Care (Signed)
Immediate Anesthesia Transfer of Care Note  Patient: [name]  Procedure(s) Performed: LEFT BREAST LUMPECTOMY WITH RADIOACTIVE SEED LOCALIZATION (Left Breast)  Patient Location: PACU  Anesthesia Type:General  Level of Consciousness: awake, alert , oriented and patient cooperative  Airway & Oxygen Therapy: Patient Spontanous Breathing  Post-op Assessment: Report given to RN and Post -op Vital signs reviewed and stable  Post vital signs: Reviewed and stable  Last Vitals:  Vitals Value Taken Time  BP 108/70 07/07/20 1617  Temp 36.7 C 07/07/20 1548  Pulse 61 07/07/20 1621  Resp 20 07/07/20 1621  SpO2 94 % 07/07/20 1621  Vitals shown include unvalidated device data.  Last Pain:  Vitals:   07/07/20 1258  TempSrc:   PainSc: 0-No pain      Patients Stated Pain Goal: 7 (21/97/58 8325)  Complications: No complications documented.

## 2020-07-07 NOTE — Transfer of Care (Deleted)
Immediate Anesthesia Transfer of Care Note  Patient: [name]  Procedure(s) Performed: LEFT BREAST LUMPECTOMY WITH RADIOACTIVE SEED LOCALIZATION (Left Breast)  Patient Location: PACU  Anesthesia Type:General  Level of Consciousness: awake, alert , oriented and patient cooperative  Airway & Oxygen Therapy: Patient Spontanous Breathing  Post-op Assessment: Report given to RN and Post -op Vital signs reviewed and stable  Post vital signs: Reviewed and stable  Last Vitals:  Vitals Value Taken Time  BP 108/70 07/07/20 1617  Temp 36.7 C 07/07/20 1548  Pulse 61 07/07/20 1621  Resp 20 07/07/20 1621  SpO2 94 % 07/07/20 1621  Vitals shown include unvalidated device data.  Last Pain:  Vitals:   07/07/20 1258  TempSrc:   PainSc: 0-No pain      Patients Stated Pain Goal: 7 (07/07/20 1258)  Complications: No complications documented. 

## 2020-07-07 NOTE — Discharge Instructions (Signed)
[facility] °Office Phone Number [phone] ° °BREAST BIOPSY/ PARTIAL MASTECTOMY: POST OP INSTRUCTIONS ° °Always review your discharge instruction sheet given to you by the facility where your surgery was performed. ° °IF YOU HAVE DISABILITY OR FAMILY LEAVE FORMS, YOU MUST BRING THEM TO THE OFFICE FOR PROCESSING.  DO NOT GIVE THEM TO YOUR DOCTOR. ° °1. A prescription for pain medication may be given to you upon discharge.  Take your pain medication as prescribed, if needed.  If narcotic pain medicine is not needed, then you may take acetaminophen (Tylenol) or ibuprofen (Advil) as needed. °2. Take your usually prescribed medications unless otherwise directed °3. If you need a refill on your pain medication, please contact your pharmacy.  They will contact our office to request authorization.  Prescriptions will not be filled after 5pm or on week-ends. °4. You should eat very light the first 24 hours after surgery, such as soup, crackers, pudding, etc.  Resume your normal diet the day after surgery. °5. Most patients will experience some swelling and bruising in the breast.  Ice packs and a good support bra will help.  Swelling and bruising can take several days to resolve.  °6. It is common to experience some constipation if taking pain medication after surgery.  Increasing fluid intake and taking a stool softener will usually help or prevent this problem from occurring.  A mild laxative (Milk of Magnesia or Miralax) should be taken according to package directions if there are no bowel movements after 48 hours. °7. Unless discharge instructions indicate otherwise, you may remove your bandages 24-48 hours after surgery, and you may shower at that time.  You may have steri-strips (small skin tapes) in place directly over the incision.  These strips should be left on the skin for 7-10 days.  If your surgeon used skin glue on the incision, you may shower in 24 hours.  The glue will flake off over the  next 2-3 weeks.  Any sutures or staples will be removed at the office during your follow-up visit. °8. ACTIVITIES:  You may resume regular daily activities (gradually increasing) beginning the next day.  Wearing a good support bra or sports bra minimizes pain and swelling.  You may have sexual intercourse when it is comfortable. °a. You may drive when you no longer are taking prescription pain medication, you can comfortably wear a seatbelt, and you can safely maneuver your car and apply brakes. °b. RETURN TO WORK:  ______________________________________________________________________________________ °9. You should see your doctor in the office for a follow-up appointment approximately two weeks after your surgery.  Your doctor’s nurse will typically make your follow-up appointment when she calls you with your pathology report.  Expect your pathology report 2-3 business days after your surgery.  You may call to check if you do not hear from us after three days. °10. OTHER INSTRUCTIONS: _______________________________________________________________________________________________ _____________________________________________________________________________________________________________________________________ °_____________________________________________________________________________________________________________________________________ °_____________________________________________________________________________________________________________________________________ ° °WHEN TO CALL YOUR DOCTOR: °1. Fever over 101.0 °2. Nausea and/or vomiting. °3. Extreme swelling or bruising. °4. Continued bleeding from incision. °5. Increased pain, redness, or drainage from the incision. ° °The clinic staff is available to answer your questions during regular business hours.  Please don’t hesitate to call and ask to speak to one of the nurses for clinical concerns.  If you have a medical emergency, go to the nearest  emergency room or call 911.  A surgeon from [facility] is always on call at the hospital. ° °For further questions, please visit [email or web address]  °

## 2020-07-07 NOTE — Anesthesia Procedure Notes (Signed)
Procedure Name: Intubation Date/Time: 07/07/2020 2:51 PM Performed by: Imagene Riches, CRNA Pre-anesthesia Checklist: Patient identified, Emergency Drugs available, Suction available and Patient being monitored Patient Re-evaluated:Patient Re-evaluated prior to induction Oxygen Delivery Method: Circle System Utilized Preoxygenation: Pre-oxygenation with 100% oxygen Induction Type: IV induction Ventilation: Oral airway inserted - appropriate to patient size and Two handed mask ventilation required Laryngoscope Size: Miller and 2 Grade View: Grade I Tube type: Oral Tube size: 7.0 mm Number of attempts: 1 Airway Equipment and Method: Stylet and Oral airway Placement Confirmation: ETT inserted through vocal cords under direct vision,  positive ETCO2 and breath sounds checked- equal and bilateral Secured at: 21 cm Tube secured with: Tape Dental Injury: Teeth and Oropharynx as per pre-operative assessment

## 2020-07-08 ENCOUNTER — Encounter (HOSPITAL_COMMUNITY): Payer: Self-pay | Admitting: Surgery

## 2020-07-08 NOTE — Addendum Note (Signed)
Addendum  created 07/08/20 1355 by Roderic Palau, MD   Intraprocedure Event edited, Intraprocedure Staff edited

## 2020-07-12 LAB — SURGICAL PATHOLOGY

## 2020-07-14 ENCOUNTER — Telehealth: Payer: Self-pay

## 2020-07-14 NOTE — Telephone Encounter (Signed)
-----   Message from Deboraha Sprang, MD sent at 07/13/2020  7:45 PM EST ----- Please Inform Patient  Labs are normal x #K at 3.8 on dofetilide   can we arrange for her to start on KCL 10 meq MWF and we can recheck in about 6 weeks plzx  Thanks

## 2020-07-14 NOTE — Telephone Encounter (Signed)
Spoke with pt who states she is no longer a pt of Dr Olin Pia but sees cardiology in [place].  Pt advised to continue follow up with her provider in [facility] and will notify Dr Caryl Comes no changes are needed by him at this time.

## 2020-07-14 NOTE — Telephone Encounter (Signed)
Patient is returning call.  °

## 2020-07-14 NOTE — Telephone Encounter (Signed)
Thanks sk

## 2020-07-14 NOTE — Telephone Encounter (Signed)
Attempted phone call to pt.  Per Epic OK to leave voicemail message.  PT advised on voicemail of low potassium on Dofetilide and need to start KCL 75meq; however KCL+ is currently on pt's MAR.  Requested pt to contact RN at [phone] to verify if she is indeed taking KCL daily.

## 2020-07-25 ENCOUNTER — Ambulatory Visit: Payer: BC Managed Care – PPO | Attending: Internal Medicine

## 2020-07-25 DIAGNOSIS — Z23 Encounter for immunization: Secondary | ICD-10-CM

## 2020-07-25 NOTE — Progress Notes (Signed)
   Covid-19 Vaccination Clinic  Name:  [name]    MRN: [id number] DOB: [birth year]  07/25/2020  [name] was observed post Covid-19 immunization for 15 minutes without incident. She was provided with Vaccine Information Sheet and instruction to access the V-Safe system.   [name] was instructed to call 911 with any severe reactions post vaccine: Marland Kitchen Difficulty breathing  . Swelling of face and throat  . A fast heartbeat  . A bad rash all over body  . Dizziness and weakness   Immunizations Administered    Name Date Dose VIS Date Route   PFIZER Comrnaty(Gray TOP) Covid-19 Vaccine 07/25/2020  2:22 PM 0.3 mL 05/19/2020 Intramuscular   Manufacturer: [place]   Lot: [id number]   NDC: [id number]

## 2020-08-01 ENCOUNTER — Other Ambulatory Visit: Payer: Self-pay | Admitting: Adult Health

## 2020-08-01 DIAGNOSIS — F411 Generalized anxiety disorder: Secondary | ICD-10-CM

## 2020-08-01 MED ORDER — SERTRALINE HCL 100 MG PO TABS: 100.0000 mg | ORAL_TABLET | Freq: Every day | ORAL | 3 refills | Status: DC

## 2020-08-29 ENCOUNTER — Other Ambulatory Visit: Payer: Self-pay

## 2020-08-29 ENCOUNTER — Ambulatory Visit (INDEPENDENT_AMBULATORY_CARE_PROVIDER_SITE_OTHER): Payer: BC Managed Care – PPO | Admitting: Adult Health

## 2020-08-29 ENCOUNTER — Encounter: Payer: Self-pay | Admitting: Adult Health

## 2020-08-29 DIAGNOSIS — F411 Generalized anxiety disorder: Secondary | ICD-10-CM | POA: Diagnosis not present

## 2020-08-29 NOTE — Progress Notes (Signed)
[name] [id number] 1965/10/30 55 y.o.  Subjective:   Patient ID:  [name] is a 55 y.o. (DOB March 20, 1966) female.  Chief Complaint: No chief complaint on file.   HPI [name] presents to the office today for follow-up of GAD.  Describes mood today as "ok". Pleasant. Mood symptoms - denies depression, anxiety, and irritability. Stating "I'm doing alright". Reports Zoloft at 50mg  continues to work well. Stable interest and motivation. Taking medications as prescribed.  Energy levels stable. Active, does not have a regular exercise routine. Enjoys some usual interests and activities. Single. Lives alone with dog. Sisters local. Spending time with family. Appetite adequate. Weight stable. Sleeps well most nights. Averages 6 to 8 hours. Focus and concentration stable. Completing tasks. Managing aspects of household. Work going well Nurse, children's. Denies SI or HI.  Denies AH or VH.  Previous medication trials: Denies   Doctor, general practice Office Visit from 05/26/2020 in [place] Office Visit from 03/13/2018 in [place] Office Visit from 04/25/2017 in [place] Office Visit from 11/28/2015 in [place] Office Visit from 09/01/2015 in [place]  PHQ-2 Total Score 0 0 0 0 0  PHQ-9 Total Score 0 3 -- -- --    Flowsheet Row Admission (Discharged) from 07/07/2020 in [place] [facility] from 07/05/2020 in [facility] PREADMISSION TESTING  C-SSRS RISK CATEGORY No Risk No Risk       Review of Systems:  Review of Systems  Musculoskeletal: Negative for gait problem.  Neurological: Negative for tremors.  Psychiatric/Behavioral:       Please refer to HPI    Medications: I have reviewed the patient's current medications.  Current Outpatient Medications  Medication Sig Dispense Refill  .  acetaminophen (TYLENOL) 500 MG tablet Take 500-1,000 mg by mouth every 6 (six) hours as needed for mild pain.    Marland Kitchen apixaban (ELIQUIS) 5 MG TABS tablet Take 5 mg by mouth 2 (two) times daily.    Marland Kitchen dofetilide (TIKOSYN) 500 MCG capsule Take 500 mcg by mouth 2 (two) times daily.    . fluticasone (FLONASE) 50 MCG/ACT nasal spray Place 2 sprays into both nostrils daily as needed for allergies or rhinitis.     Marland Kitchen HYDROcodone-acetaminophen (NORCO/VICODIN) 5-325 MG tablet Take 1 tablet by mouth every 6 (six) hours as needed for moderate pain. 15 tablet 0  . metoprolol succinate (TOPROL-XL) 50 MG 24 hr tablet Take 50 mg by mouth at bedtime.     . potassium chloride (KLOR-CON) 10 MEQ tablet Take 10 mEq by mouth daily.    . sertraline (ZOLOFT) 100 MG tablet Take 1 tablet (100 mg total) by mouth daily. 90 tablet 3  . spironolactone (ALDACTONE) 25 MG tablet Take 25 mg by mouth daily.    Marland Kitchen torsemide (DEMADEX) 20 MG tablet Take 40 mg by mouth daily.     No current facility-administered medications for this visit.    Medication Side Effects: None  Allergies: No Known Allergies  Past Medical History:  Diagnosis Date  . Abnormal thyroid blood test    Mildly elevated TSH, resolved.  . 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 Anson Fret to evaluate anemia; pathology showed a tubulovillous adenoma with no high grade dysplasia or malignancy identified.    . Anemia   .  Anxiety    Acute stress reaction to multiple ICD shocks  . Appendicitis, acute, s/p apendectomy 01/17/2013  . Atrial fibrillation ([place])    S/P radiofrequency ablation by Dr. Adrian Prows at [facility] on 02/09/2008 and again on 03/31/2009; S/P ablation at [facility] by Dr. Westly Pam on 08/10/2010, followed by recurrent atrial flutter/fibrillation.  Cardiac cath done on 02/19/2011 at [facility] showed no significant coronary disease.  Patient underwent Convergent  Procedure at [facility] on 03/07/2011.   . Atrial tachycardia [facility])    Status post convergent ablation [facility] October 2012.  She underwent successful catheter ablation of persistent atrial tachycardia at [facility] by Dr. Willis Modena on 05/24/2014.    Marland Kitchen CHF (congestive heart failure) ([place])    per patient "sees Dr. Princella Pellegrini"  . Cholecystitis, acute 07/2001   S/P laparoscopic cholecystectomy, intraoperative cholangiogram, and transcystic common bile duct exploration by Dr. Jackolyn Confer on 07/14/2001  . Dizziness   . Family history of adverse reaction to anesthesia    per patient "sister had nausea and vomitting"  . Fibroids   . GERD (gastroesophageal reflux disease)   . GI bleed 05/25/2011  . Heart murmur    per patient "found at the age of 73"  . Hyperglycemia   . HYPERGLYCEMIA 12/24/2008   Qualifier: Diagnosis of  By: Marinda Elk MD, Sonia Side    . 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   . Intra-abdominal abscess ([place]) 04/11/2019  . Pelvic mass    Pelvic ultrasound done on 11/30/10 to evaluate abdominal pain showed,  positioned between the ovaries and contiguous with the ovaries, a complex mass demonstrating posterior acoustical enhancement and no definite intralesional flow with color Doppler exam measuring 5.2 x 3.1 x 5.5 cm. The appearance raised the possibility of a ruptured ovarian cyst with subsequent development of a hematoma.   Marland Kitchen PONV (postoperative nausea and vomiting)    per patient about 11 years ago but hasn't happened since then  . Presence of permanent cardiac pacemaker    per patient "pacemaker and defibrillator"  . Seasonal allergic rhinitis   . Ventricular tachycardia ([place])    Hx of  nonsustained ventricular tachycardia, S/P hospitalization in March 2009; S/P ICD placement by Dr. Deboraha Sprang on 09/25/2007.    Family History  Problem Relation Age of Onset  . Hypertrophic cardiomyopathy Sister   . Heart disease Sister   . Hypertension Sister   . Throat cancer Father   . Coronary artery disease Father 69       S/P CABG  . Heart disease Father   . Cancer Father        throat  . Colon polyps Mother   . Hypertrophic cardiomyopathy Mother   . Heart disease Mother   . Hyperlipidemia Mother   . Cancer Maternal Aunt        breast  . Heart disease Sister   . Hypertrophic cardiomyopathy Cousin        3 cousins on mother's side Deceased  . Breast cancer Paternal Aunt   . Hypertrophic cardiomyopathy Maternal Grandmother   . Hypertrophic cardiomyopathy Other        great uncle  . Ovarian cancer Neg Hx  great aunt had  . Colon cancer Neg Hx   . Lung cancer Neg Hx   . Diabetes Neg Hx     Social History   Socioeconomic History  . Marital status: Significant Other    Spouse name: Not on file  . Number of children: 0  . Years of education: Not on file  . Highest education level: Not on file  Occupational History  . Occupation: Buyer, retail: [employer]  Tobacco Use  . Smoking status: Never Smoker  . Smokeless tobacco: Never Used  Vaping Use  . Vaping Use: Never used  Substance and Sexual Activity  . Alcohol use: Yes    Alcohol/week: 1.0 standard drink    Types: 1 Standard drinks or equivalent per week    Comment: per patient about 1 drink a month  . Drug use: No  . Sexual activity: Not Currently    Partners: Female    Birth control/protection: Post-menopausal  Other Topics Concern  . Not on file  Social History Narrative   Lives with friend Olivia Mackie and another friend Maudie Mercury part of the time   Social Determinants of Radio broadcast assistant Strain: Not on [employer] Insecurity: Not on file  Transportation Needs: Not on file   Physical Activity: Not on file  Stress: Not on file  Social Connections: Not on file  Intimate Partner Violence: Not on file    Past Medical History, Surgical history, Social history, and Family history were reviewed and updated as appropriate.   Please see review of systems for further details on the patient's review from today.   Objective:   Physical Exam:  LMP 10/06/2011   Physical Exam Constitutional:      General: She is not in acute distress. Genitourinary:    Rectum: Guaiac stool:     Musculoskeletal:        General: No deformity.  Neurological:     Mental Status: She is alert and oriented to person, place, and time.     Coordination: Coordination normal.  Psychiatric:        Attention and Perception: Attention and perception normal. She does not perceive auditory or visual hallucinations.        Mood and Affect: Mood normal. Mood is not anxious or depressed. Affect is not labile, blunt, angry or inappropriate.        Speech: Speech normal.        Behavior: Behavior normal.        Thought Content: Thought content normal. Thought content is not paranoid or delusional. Thought content does not include homicidal or suicidal ideation. Thought content does not include homicidal or suicidal plan.        Cognition and Memory: Cognition and memory normal.        Judgment: Judgment normal.     Comments: Insight intact     Lab Review:     Component Value Date/Time   NA 138 07/05/2020 1041   K 3.8 07/05/2020 1041   CL 97 (L) 07/05/2020 1041   CO2 29 07/05/2020 1041   GLUCOSE 88 07/05/2020 1041   BUN 31 (H) 07/05/2020 1041   CREATININE 1.09 (H) 07/05/2020 1041   CREATININE 0.91 07/23/2016 1500   CALCIUM 10.2 07/05/2020 1041   PROT 7.8 07/05/2020 1041   PROT 6.4 09/01/2015 1454   ALBUMIN 4.6 07/05/2020 1041   ALBUMIN 4.4 09/01/2015 1454   AST 37 07/05/2020 1041   ALT 24 07/05/2020 1041  ALKPHOS 76 07/05/2020 1041   BILITOT 1.5 (H) 07/05/2020 1041   BILITOT 0.6  09/01/2015 1454   GFRNONAA 60 (L) 07/05/2020 1041   GFRNONAA 80 12/09/2014 0904   GFRAA >60 04/13/2019 0224   GFRAA >89 12/09/2014 0904       Component Value Date/Time   WBC 7.0 07/05/2020 1041   RBC 4.96 07/05/2020 1041   HGB 14.3 07/05/2020 1041   HCT 43.6 07/05/2020 1041   PLT 271 07/05/2020 1041   MCV 87.9 07/05/2020 1041   MCH 28.8 07/05/2020 1041   MCHC 32.8 07/05/2020 1041   RDW 13.2 07/05/2020 1041   LYMPHSABS 0.9 07/05/2020 1041   MONOABS 0.6 07/05/2020 1041   EOSABS 0.1 07/05/2020 1041   BASOSABS 0.0 07/05/2020 1041    No results found for: POCLITH, LITHIUM   No results found for: PHENYTOIN, PHENOBARB, VALPROATE, CBMZ   .res Assessment: Plan:    Plan:  PDMP reviewed  1. Continue Zoloft 50mg  daily  RTC 1 year  Patient advised to contact office with any questions, adverse effects, or acute worsening in signs and symptoms.   Diagnoses and all orders for this visit:  Generalized anxiety disorder     Please see After Visit Summary for patient specific instructions.  No future appointments.  No orders of the defined types were placed in this encounter.   -------------------------------

## 2020-10-21 ENCOUNTER — Telehealth: Payer: Self-pay

## 2020-10-21 NOTE — Telephone Encounter (Signed)
Return pt's call. Stated she's going deep sea fishing on May 21st and as a precautions, she would like a rx for a motion sickness patch. Stated she usually don't get sick but as a precaution. [facility] [place].

## 2020-10-21 NOTE — Telephone Encounter (Signed)
Called and left message, I could certainly send in scopolamine patch for motion sickness, however, I asked her to reach out to her cardiology office to make sure that is fine given history of arrhythmia.

## 2020-10-21 NOTE — Telephone Encounter (Signed)
Pls contact pt [phone] regarding motion sickness patch.

## 2020-10-24 ENCOUNTER — Telehealth: Payer: Self-pay

## 2020-10-24 MED ORDER — SCOPOLAMINE 1 MG/3DAYS TD PT72: 1.0000 | MEDICATED_PATCH | TRANSDERMAL | 1 refills | Status: AC | PRN

## 2020-10-24 NOTE — Telephone Encounter (Signed)
I have sent in the Rx

## 2020-10-24 NOTE — Telephone Encounter (Signed)
Return pt's call - stated she received Dr Jodene Nam message about calling her cardiologist about scopolamine patches. Stated she has not called there office yet b/c she has used them before, not for sea sickness but when she had the ablations, the anesthesiologist used them so she would not wake up sick and they worked.

## 2020-10-24 NOTE — Telephone Encounter (Signed)
Pls contact pt [phone]

## 2020-12-13 ENCOUNTER — Encounter: Payer: Self-pay | Admitting: *Deleted

## 2020-12-30 ENCOUNTER — Ambulatory Visit: Payer: BC Managed Care – PPO | Attending: Internal Medicine

## 2020-12-30 DIAGNOSIS — Z23 Encounter for immunization: Secondary | ICD-10-CM

## 2020-12-30 NOTE — Progress Notes (Signed)
   Covid-19 Vaccination Clinic  Name:  [name]    MRN: [id number] DOB: 11/27/1965  12/30/2020  [name] was observed post Covid-19 immunization for 15 minutes without incident. She was provided with Vaccine Information Sheet and instruction to access the V-Safe system.   [name] was instructed to call 911 with any severe reactions post vaccine: Difficulty breathing  Swelling of face and throat  A fast heartbeat  A bad rash all over body  Dizziness and weakness   Immunizations Administered     Name Date Dose VIS Date Route   PFIZER Comrnaty(Gray TOP) Covid-19 Vaccine 12/30/2020  1:25 PM 0.3 mL 05/19/2020 Intramuscular   Manufacturer: [place]   Lot: [id number]   [place]: [phone]

## 2021-01-02 ENCOUNTER — Other Ambulatory Visit (HOSPITAL_BASED_OUTPATIENT_CLINIC_OR_DEPARTMENT_OTHER): Payer: Self-pay

## 2021-01-02 MED ORDER — COVID-19 MRNA VAC-TRIS(PFIZER) 30 MCG/0.3ML IM SUSP
INTRAMUSCULAR | 0 refills | Status: DC
  Filled 2021-01-02: qty 0.3, 1d supply, fill #0

## 2021-07-20 ENCOUNTER — Encounter: Payer: Self-pay | Admitting: Obstetrics and Gynecology

## 2021-08-09 ENCOUNTER — Encounter: Payer: Self-pay | Admitting: Obstetrics and Gynecology

## 2021-08-29 ENCOUNTER — Other Ambulatory Visit: Payer: Self-pay

## 2021-08-29 ENCOUNTER — Encounter: Payer: Self-pay | Admitting: Adult Health

## 2021-08-29 ENCOUNTER — Ambulatory Visit: Payer: BC Managed Care – PPO | Admitting: Adult Health

## 2021-08-29 DIAGNOSIS — F411 Generalized anxiety disorder: Secondary | ICD-10-CM | POA: Diagnosis not present

## 2021-08-29 MED ORDER — SERTRALINE HCL 100 MG PO TABS: 100.0000 mg | ORAL_TABLET | Freq: Every day | ORAL | 3 refills | Status: DC

## 2021-08-29 MED ORDER — SERTRALINE HCL 50 MG PO TABS: 100.0000 mg | ORAL_TABLET | Freq: Every day | ORAL | 3 refills | Status: DC

## 2021-08-29 NOTE — Progress Notes (Signed)
[name] ?[id number] ?1966/05/09 ?56 y.o. ? ?Subjective:  ? ?Patient ID:  [name] is a 56 y.o. (DOB 04/04/1966) female. ? ?Chief Complaint: No chief complaint on file. ? ? ?HPI ?[name] presents to the office today for follow-up of GAD. ? ?Describes mood today as "ok". Pleasant. Mood symptoms - denies depression, anxiety, and irritability. Stating "I'm doing good". Reports Zoloft at '50mg'$  continues to work well. Recently retired. Stable interest and motivation. Taking medications as prescribed.  ?Energy levels stable. Active, does not have a regular exercise routine. ?Enjoys some usual interests and activities. Single. Lives alone with dog. Sisters local. Spending time with family. ?Appetite adequate. Weight loss. ?Sleeps well most nights. Averages 6 to 8 hours. ?Focus and concentration stable. Completing tasks. Managing aspects of household.  ?Denies SI or HI.  ?Denies AH or VH. ? ?Previous medication trials: Denies ? ? ?PHQ2-9   ? ?[place] Office Visit from 05/26/2020 in [place] Office Visit from 03/13/2018 in [place] Office Visit from 04/25/2017 in [place] Office Visit from 11/28/2015 in [place] Office Visit from 09/01/2015 in [place]  ?PHQ-2 Total Score 0 0 0 0 0  ?PHQ-9 Total Score 0 3 -- -- --  ? ?  ? ?Flowsheet Row Admission (Discharged) from 07/07/2020 in [place] [facility] from 07/05/2020 in [facility] PREADMISSION TESTING  ?C-SSRS RISK CATEGORY No Risk No Risk  ? ?  ?  ? ?Review of Systems:  ?Review of Systems  ?Musculoskeletal:  Negative for gait problem.  ?Neurological:  Negative for tremors.  ?Psychiatric/Behavioral:    ?     Please refer to HPI  ? ?Medications: I have reviewed the patient's current medications. ? ?Current Outpatient Medications  ?Medication Sig Dispense Refill  ? acetaminophen  (TYLENOL) 500 MG tablet Take 500-1,000 mg by mouth every 6 (six) hours as needed for mild pain.    ? apixaban (ELIQUIS) 5 MG TABS tablet Take 5 mg by mouth 2 (two) times daily.    ? COVID-19 mRNA Vac-TriS, Pfizer, SUSP injection Inject into the muscle. 0.3 mL 0  ? dofetilide (TIKOSYN) 500 MCG capsule Take 500 mcg by mouth 2 (two) times daily.    ? fluticasone (FLONASE) 50 MCG/ACT nasal spray Place 2 sprays into both nostrils daily as needed for allergies or rhinitis.     ? HYDROcodone-acetaminophen (NORCO/VICODIN) 5-325 MG tablet Take 1 tablet by mouth every 6 (six) hours as needed for moderate pain. 15 tablet 0  ? metoprolol succinate (TOPROL-XL) 50 MG 24 hr tablet Take 50 mg by mouth at bedtime.     ? potassium chloride (KLOR-CON) 10 MEQ tablet Take 10 mEq by mouth daily.    ? scopolamine (TRANSDERM-SCOP, 1.5 MG,) 1 MG/3DAYS Place 1 patch (1.5 mg total) onto the skin every 3 (three) days as needed (motion sickness). 4 patch 1  ? sertraline (ZOLOFT) 50 MG tablet Take 2 tablets (100 mg total) by mouth daily. 90 tablet 3  ? spironolactone (ALDACTONE) 25 MG tablet Take 25 mg by mouth daily.    ? torsemide (DEMADEX) 20 MG tablet Take 40 mg by mouth daily.    ? ?No current facility-administered medications for this visit.  ? ? ?Medication Side Effects: None ? ?Allergies: No Known Allergies ? ?Past Medical History:  ?Diagnosis Date  ? Abnormal thyroid blood test   ? Mildly elevated TSH,  resolved.  ? Adenomatous rectal polyp 05/22/2011  ? A 10 mm sessile rectal polyp was found on colonoscopy done 05/22/2011 by Dr. Anson Fret to evaluate anemia; pathology showed a tubulovillous adenoma with no high grade dysplasia or malignancy identified.    ? Anemia   ? Anxiety   ? Acute stress reaction to multiple ICD shocks  ? Appendicitis, acute, s/p apendectomy 01/17/2013  ? Atrial fibrillation ([place])   ? S/P radiofrequency ablation by Dr. Adrian Prows at [facility] on 02/09/2008 and again on 03/31/2009; S/P ablation  at [facility] by Dr. Westly Pam on 08/10/2010, followed by recurrent atrial flutter/fibrillation.  Cardiac cath done on 02/19/2011 at [facility] showed no significant coronary disease.  Patient underwent Convergent Procedure at [facility] on 03/07/2011.   ? Atrial tachycardia ([place])   ? Status post convergent ablation [facility] October 2012.  She underwent successful catheter ablation of persistent atrial tachycardia at [facility] by Dr. Willis Modena on 05/24/2014.    ? CHF (congestive heart failure) ([place])   ? per patient "sees Dr. Princella Pellegrini"  ? Cholecystitis, acute 07/2001  ? S/P laparoscopic cholecystectomy, intraoperative cholangiogram, and transcystic common bile duct exploration by Dr. Jackolyn Confer on 07/14/2001  ? Dizziness   ? Family history of adverse reaction to anesthesia   ? per patient "sister had nausea and vomitting"  ? Fibroids   ? GERD (gastroesophageal reflux disease)   ? GI bleed 05/25/2011  ? Heart murmur   ? per patient "found at the age of 59"  ? Hyperglycemia   ? HYPERGLYCEMIA 12/24/2008  ? Qualifier: Diagnosis of  By: Marinda Elk MD, Sonia Side    ? 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 TEE 09/02/2007 showed moderate to severe left ventricular hypertrophy with an interventricular septal dimension of 1.5 cm and posterior wall dimension of 7 cm; there was normal LV systolic function and estimated EF of approximately 60%; there did not appear to be any obvious outflow tract obstruction.   ? Intra-abdominal abscess ([place]) 04/11/2019  ? Pelvic mass   ? Pelvic ultrasound done on 11/30/10 to evaluate abdominal pain showed,  positioned between the ovaries and contiguous with the ovaries, a complex mass demonstrating posterior acoustical enhancement and no definite intralesional flow with color Doppler exam measuring 5.2 x 3.1 x 5.5 cm. The appearance raised the possibility of a ruptured ovarian cyst with subsequent development of a hematoma.   ? PONV (postoperative nausea and vomiting)   ? per patient about 11 years ago but hasn't  happened since then  ? Presence of permanent cardiac pacemaker   ? per patient "pacemaker and defibrillator"  ? Seasonal allergic rhinitis   ? Ventricular tachycardia   ? Hx of nonsustained ventricular tachycardia, S/P hospitalization in March 2009; S/P ICD placement by Dr. Deboraha Sprang on 09/25/2007.  ? ? ?Past Medical History, Surgical history, Social history, and Family history were reviewed and updated as appropriate.  ? ?Please see review of systems for further details on the patient's review from today.  ? ?Objective:  ? ?Physical Exam:  ?LMP 10/06/2011  ? ?Physical Exam ?Constitutional:   ?   General: She is not in acute distress. ?Musculoskeletal:     ?   General: No deformity.  ?Neurological:  ?   Mental Status: She is alert and oriented to person, place, and time.  ?   Coordination: Coordination normal.  ?Psychiatric:     ?   Attention and Perception: Attention and perception normal. She does not perceive auditory  or visual hallucinations.     ?   Mood and Affect: Mood normal. Mood is not anxious or depressed. Affect is not labile, blunt, angry or inappropriate.     ?   Speech: Speech normal.     ?   Behavior: Behavior normal.     ?   Thought Content: Thought content normal. Thought content is not paranoid or delusional. Thought content does not include homicidal or suicidal ideation. Thought content does not include homicidal or suicidal plan.     ?   Cognition and Memory: Cognition and memory normal.     ?   Judgment: Judgment normal.  ?   Comments: Insight intact  ? ? ?Lab Review:  ?   ?Component Value Date/Time  ? NA 138 07/05/2020 1041  ? K 3.8 07/05/2020 1041  ? CL 97 (L) 07/05/2020 1041  ? CO2 29 07/05/2020 1041  ? GLUCOSE 88 07/05/2020 1041  ? BUN 31 (H) 07/05/2020 1041  ? CREATININE 1.09 (H) 07/05/2020 1041  ? CREATININE 0.91 07/23/2016 1500  ? CALCIUM 10.2 07/05/2020 1041  ? PROT 7.8 07/05/2020 1041  ? PROT 6.4 09/01/2015 1454  ? ALBUMIN 4.6 07/05/2020 1041  ? ALBUMIN 4.4 09/01/2015 1454  ? AST  37 07/05/2020 1041  ? ALT 24 07/05/2020 1041  ? ALKPHOS 76 07/05/2020 1041  ? BILITOT 1.5 (H) 07/05/2020 1041  ? BILITOT 0.6 09/01/2015 1454  ? GFRNONAA 60 (L) 07/05/2020 1041  ? GFRNONAA 80 12/09/18

## 2021-09-03 ENCOUNTER — Encounter: Payer: Self-pay | Admitting: Adult Health

## 2021-10-03 ENCOUNTER — Encounter (HOSPITAL_COMMUNITY): Payer: Self-pay

## 2022-02-28 ENCOUNTER — Ambulatory Visit: Payer: BC Managed Care – PPO | Admitting: Obstetrics and Gynecology

## 2022-03-14 ENCOUNTER — Ambulatory Visit (INDEPENDENT_AMBULATORY_CARE_PROVIDER_SITE_OTHER): Payer: Self-pay | Admitting: Obstetrics and Gynecology

## 2022-03-14 ENCOUNTER — Other Ambulatory Visit (HOSPITAL_COMMUNITY)
Admission: RE | Admit: 2022-03-14 | Discharge: 2022-03-14 | Disposition: A | Discharge status: Home / Self Care | Payer: BC Managed Care – PPO | Source: Ambulatory Visit | Attending: Obstetrics and Gynecology | Admitting: Obstetrics and Gynecology

## 2022-03-14 ENCOUNTER — Encounter: Payer: Self-pay | Admitting: Obstetrics and Gynecology

## 2022-03-14 VITALS — BP 120/82 | HR 60 | Ht 64.25 in | Wt 164.0 lb

## 2022-03-14 DIAGNOSIS — Z124 Encounter for screening for malignant neoplasm of cervix: Secondary | ICD-10-CM

## 2022-03-14 DIAGNOSIS — Z01419 Encounter for gynecological examination (general) (routine) without abnormal findings: Secondary | ICD-10-CM

## 2022-03-14 DIAGNOSIS — Z0189 Encounter for other specified special examinations: Secondary | ICD-10-CM

## 2022-03-14 NOTE — Progress Notes (Signed)
56 y.o. G0P0000 Single Caucasian female here for annual exam.    No GYN concerns.  No vaginal bleeding.   Has a defibrillator and is on Xarelto.  No lipids noted on chart review.  PCP:  Joni Reining   Patient's last menstrual period was 10/06/2011.           Sexually active: Yes.    The current method of family planning is female partner.    Exercising: Yes.     Outside work Smoker:  no  Health Maintenance: Pap:  07-23-16 neg HPV HR neg, 02-23-13 neg History of abnormal Pap:  no MMG:  07-18-21 category b density birads 2:neg Colonoscopy:  2018 BMD:   n/a  Result  n/a TDaP:  2018 Gardasil:   no HIV: neg 2020 Hep C: neg 2016 Screening Labs:  Cardiology Flu vaccine:  does not do this. Covid vaccine:  2 vaccines and 2 boosters.      reports that she has never smoked. She has never used smokeless tobacco. She reports that she does not currently use alcohol. She reports that she does not use drugs.  Past Medical History:  Diagnosis Date   Abnormal thyroid blood test    Mildly elevated TSH, resolved.   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 Anson Fret to evaluate anemia; pathology showed a tubulovillous adenoma with no high grade dysplasia or malignancy identified.     Anemia    Anxiety    Acute stress reaction to multiple ICD shocks   Appendicitis, acute, s/p apendectomy 01/17/2013   Atrial fibrillation ([place])    S/P radiofrequency ablation by Dr. Adrian Prows at [facility] on 02/09/2008 and again on 03/31/2009; S/P ablation at [facility] by Dr. Westly Pam on 08/10/2010, followed by recurrent atrial flutter/fibrillation.  Cardiac cath done on 02/19/2011 at [facility] showed no significant coronary disease.  Patient underwent Convergent Procedure at [facility] on 03/07/2011.    Atrial tachycardia    Status post convergent ablation [facility] October 2012.  She underwent successful catheter ablation of persistent  atrial tachycardia at [facility] by Dr. Willis Modena on 05/24/2014.     CHF (congestive heart failure) ([place])    per patient "sees Dr. Princella Pellegrini"   Cholecystitis, acute 07/2001   S/P laparoscopic cholecystectomy, intraoperative cholangiogram, and transcystic common bile duct exploration by Dr. 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    Intra-abdominal abscess ([place]) 04/11/2019   Pelvic mass    Pelvic ultrasound done on 11/30/10 to evaluate abdominal pain showed,  positioned between the ovaries and contiguous with the ovaries, a complex mass demonstrating posterior acoustical enhancement and no definite intralesional flow with color Doppler exam measuring 5.2 x  3.1 x 5.5 cm. The appearance raised the possibility of a ruptured ovarian cyst with subsequent development of a hematoma.    PONV (postoperative nausea and vomiting)    per patient about 11 years ago but hasn't happened since then   Presence of permanent cardiac pacemaker    per patient "pacemaker and defibrillator"   Seasonal allergic rhinitis    Ventricular tachycardia ([place])    Hx of nonsustained ventricular tachycardia, S/P hospitalization in March 2009; S/P ICD placement by Dr. Deboraha Sprang on 09/25/2007.    Past Surgical History:  Procedure Laterality Date    APPENDECTOMY     BREAST LUMPECTOMY WITH RADIOACTIVE SEED LOCALIZATION Left 07/07/2020   Procedure: LEFT BREAST LUMPECTOMY WITH RADIOACTIVE SEED LOCALIZATION;  Surgeon: Erroll Luna, MD;  Location: [place];  Service: General;  Laterality: Left;   CARDIAC DEFIBRILLATOR PLACEMENT  09/25/2007; 08/2013   Placed by Dr. Deboraha Sprang on 09/25/2007; gen change 08/2013 by Dr Caryl Comes (STJ dual chamber ICD)   Convergent Ablation  03/07/2011   Patient underwent Convergent Procedure at [facility] on 03/07/2011.   FLEXIBLE SIGMOIDOSCOPY  05/26/2011   Procedure: FLEXIBLE SIGMOIDOSCOPY;  Surgeon: Lear Ng, MD;  Location: [facility] ENDOSCOPY;  Service: Endoscopy;  Laterality: N/A;   IMPLANTABLE CARDIOVERTER DEFIBRILLATOR (ICD) GENERATOR CHANGE N/A 08/12/2013   Procedure: ICD GENERATOR CHANGE;  Surgeon: Deboraha Sprang, MD;  Location: [facility] CATH LAB;  Service: Cardiovascular;  Laterality: N/A;   LAPAROSCOPIC APPENDECTOMY N/A 01/17/2013   Procedure: APPENDECTOMY LAPAROSCOPIC;  Surgeon: Rolm Bookbinder, MD;  Location: [place];  Service: General;  Laterality: N/A;   LAPAROSCOPIC CHOLECYSTECTOMY  07/14/2001   S/P laparoscopic cholecystectomy, intraoperative cholangiogram, and transcystic common bile duct exploration by Dr. Jackolyn Confer on 07/14/2001   RADIOFREQUENCY ABLATION  08/10/2010   ablation  done at [place]  03/2009   ablation at [place]  01/2008   ablation at [place]  05/24/2014   Successful radiofrequency catheter ablation by Dr. Willis Modena at [facility].    Current Outpatient Medications  Medication Sig Dispense Refill   acetaminophen (TYLENOL) 500 MG tablet Take 500-1,000 mg by mouth every 6 (six) hours as needed for mild pain.     dofetilide (TIKOSYN) 500 MCG capsule Take 500 mcg by mouth 2 (two) times daily.     fluticasone (FLONASE) 50 MCG/ACT nasal spray Place 2 sprays into both nostrils daily as needed for allergies or rhinitis.      metoprolol  succinate (TOPROL-XL) 50 MG 24 hr tablet Take 50 mg by mouth at bedtime.      potassium chloride (KLOR-CON) 10 MEQ tablet Take 10 mEq by mouth daily.     sertraline (ZOLOFT) 50 MG tablet Take by mouth daily.     spironolactone (ALDACTONE) 25 MG tablet Take 25 mg by mouth daily.     torsemide (DEMADEX) 20 MG tablet Take 40 mg by mouth daily.     XARELTO 20 MG TABS tablet Take 20 mg by mouth daily.     No current facility-administered medications for this visit.    Family History  Problem Relation Age of Onset   Hypertrophic cardiomyopathy Sister    Heart disease Sister    Hypertension Sister    Throat cancer Father    Coronary artery disease Father 45       S/P CABG   Heart disease Father    Cancer Father        throat   Colon polyps  Mother    Hypertrophic cardiomyopathy Mother    Heart disease Mother    Hyperlipidemia Mother    Cancer Maternal Aunt        breast   Heart disease Sister    Hypertrophic cardiomyopathy Cousin        3 cousins on mother's side Deceased   Breast cancer Paternal Aunt    Hypertrophic cardiomyopathy Maternal Grandmother    Hypertrophic cardiomyopathy Other        great uncle   Ovarian cancer Neg Hx        great aunt had   Colon cancer Neg Hx    Lung cancer Neg Hx    Diabetes Neg Hx     Review of Systems  Constitutional: Negative.   HENT: Negative.    Eyes: Negative.   Respiratory: Negative.    Cardiovascular: Negative.   Gastrointestinal: Negative.   Endocrine: Negative.   Genitourinary: Negative.   Musculoskeletal: Negative.   Skin: Negative.   Allergic/Immunologic: Negative.   Neurological: Negative.   Hematological: Negative.   Psychiatric/Behavioral: Negative.      Exam:   BP 120/82   Pulse 60   Ht 5' 4.25" (1.632 m)   Wt 164 lb (74.4 kg)   LMP 10/06/2011   SpO2 96%   BMI 27.93 kg/m     General appearance: alert, cooperative and appears stated age Head: normocephalic, without obvious abnormality, atraumatic Neck: no  adenopathy, supple, symmetrical, trachea midline and thyroid normal to inspection and palpation Lungs: clear to auscultation bilaterally Breasts: right - normal appearance, no masses or tenderness, No nipple retraction or dimpling, No nipple discharge or bleeding, No axillary adenopathy Left - Defibrillator present left anterior chest wall. Heart: regular rate and rhythm Abdomen: soft, non-tender; no masses, no organomegaly Extremities: extremities normal, atraumatic, no cyanosis or edema Skin: skin color, texture, turgor normal. No rashes or lesions Lymph nodes: cervical, supraclavicular, and axillary nodes normal. Neurologic: grossly normal  Pelvic: External genitalia:  no lesions              No abnormal inguinal nodes palpated.              Urethra:  normal appearing urethra with no masses, tenderness or lesions              Bartholins and Skenes: normal                 Vagina: normal appearing vagina with normal color and discharge, no lesions              Cervix: no lesions              Pap taken: yes Bimanual Exam:  Uterus:  normal size, contour, position, consistency, mobility, non-tender              Adnexa: no mass, fullness, tenderness              Rectal exam: yes.  Confirms.              Anus:  normal sphincter tone, no lesions  Chaperone was present for exam:  Joy, CMA  Assessment:   Well woman visit with gynecologic exam. Cervical cancer screening. Hx atrial fibrillation and ventricular tachycardia.  Has defibrillator.  On Xarelto. Status post left breast biopsy in 2022.   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 Routine lab testing.   Plan: Mammogram screening discussed. Self breast awareness reviewed. Pap and HR HPV collected. Guidelines for  Calcium, Vitamin D, regular exercise program including cardiovascular and weight bearing exercise. Lipids. Follow up annually and prn.   After visit summary provided.

## 2022-03-14 NOTE — Patient Instructions (Signed)

## 2022-03-15 LAB — LIPID PANEL
Cholesterol: 206 mg/dL — ABNORMAL HIGH (ref <200)
HDL: 39 mg/dL — ABNORMAL LOW (ref >=50)
LDL Cholesterol (Calc): 140 mg/dL (calc) — ABNORMAL HIGH
Non-HDL Cholesterol (Calc): 167 mg/dL (calc) — ABNORMAL HIGH (ref <130)
Total CHOL/HDL Ratio: 5.3 (calc) — ABNORMAL HIGH (ref <5.0)
Triglycerides: 138 mg/dL (ref <150)

## 2022-03-16 LAB — CYTOLOGY - PAP
Comment: NEGATIVE
Diagnosis: NEGATIVE
High risk HPV: NEGATIVE

## 2022-03-21 ENCOUNTER — Other Ambulatory Visit: Payer: Self-pay | Admitting: Adult Health

## 2022-04-04 ENCOUNTER — Encounter: Payer: Self-pay | Admitting: *Deleted

## 2022-04-04 NOTE — Progress Notes (Unsigned)
Referring:  Warden Fillers, MD [address] [place],  [place] [address]  PCP: Lucious Groves, DO  Neurology was asked to evaluate [name], a 56 year old female for a chief complaint of headaches.  Our recommendations of care will be communicated by shared medical record.    CC:  headaches  History provided from self  HPI:  Medical co-morbidities: HOCM, Afib, Vtach s/p ICD  The patient presents for evaluation of headaches which began in 2020. Headaches are described as sharp pain in her occiput which radiates forward behind her eyes and down her neck. They are associated with photophobia and nausea, and can last for several hours at a time. Currently she is having one headache per week. She was having daily headaches a couple of months ago, but these improved when she started wearing her glasses more frequently Takes Tylenol as needed which does help sometimes, but she will often need to take 4 in a day to reduce her headache.  Headache History: Onset: 2020 Triggers: no Aura: none Location: right occiput radiating forward Quality/Description: sharp Associated Symptoms:  Photophobia: yes  Phonophobia: no  Nausea: yes Worse with activity?: yes Duration of headaches: hours  Headache days per month: 4 Headache free days per month: 26  Current Treatment: Abortive Tylenol  Preventative none  Prior Therapies                                 Can't take NSAIDs as she is on blood thinners Tylenol Metoprolol 50 mg daily Zoloft 150 mg daily   LABS: CBC    Component Value Date/Time   WBC 7.0 07/05/2020 1041   RBC 4.96 07/05/2020 1041   HGB 14.3 07/05/2020 1041   HCT 43.6 07/05/2020 1041   PLT 271 07/05/2020 1041   MCV 87.9 07/05/2020 1041   MCH 28.8 07/05/2020 1041   MCHC 32.8 07/05/2020 1041   RDW 13.2 07/05/2020 1041   LYMPHSABS 0.9 07/05/2020 1041   MONOABS 0.6 07/05/2020 1041   EOSABS 0.1 07/05/2020 1041   BASOSABS 0.0 07/05/2020 1041       Latest Ref Rng & Units 07/05/2020   10:41 AM 04/13/2019    2:24 AM 04/12/2019    3:04 AM  CMP  Glucose 70 - 99 mg/dL 88  96  77   BUN 6 - 20 mg/dL '31  10  14   '$ Creatinine 0.44 - 1.00 mg/dL 1.09  0.98  1.02   Sodium 135 - 145 mmol/L 138  138  138   Potassium 3.5 - 5.1 mmol/L 3.8  4.1  3.3   Chloride 98 - 111 mmol/L 97  104  104   CO2 22 - 32 mmol/L '29  27  23   '$ Calcium 8.9 - 10.3 mg/dL 10.2  8.9  8.8   Total Protein 6.5 - 8.1 g/dL 7.8   6.1   Total Bilirubin 0.3 - 1.2 mg/dL 1.5   1.4   Alkaline Phos 38 - 126 U/L 76   86   AST 15 - 41 U/L 37   16   ALT 0 - 44 U/L 24   12      IMAGING:  none  Current Outpatient Medications on File Prior to Visit  Medication Sig Dispense Refill   acetaminophen (TYLENOL) 500 MG tablet Take 500-1,000 mg by mouth every 6 (six) hours as needed for mild pain.     dofetilide (  TIKOSYN) 500 MCG capsule Take 500 mcg by mouth 2 (two) times daily.     fluticasone (FLONASE) 50 MCG/ACT nasal spray Place 2 sprays into both nostrils daily as needed for allergies or rhinitis.      metoprolol succinate (TOPROL-XL) 50 MG 24 hr tablet Take 50 mg by mouth at bedtime.      potassium chloride (KLOR-CON) 10 MEQ tablet Take 10 mEq by mouth daily.     sertraline (ZOLOFT) 50 MG tablet Take by mouth daily.     spironolactone (ALDACTONE) 25 MG tablet Take 25 mg by mouth daily.     torsemide (DEMADEX) 20 MG tablet Take 20 mg by mouth daily.     XARELTO 20 MG TABS tablet Take 20 mg by mouth daily.     sertraline (ZOLOFT) 100 MG tablet TAKE 1 TABLET BY MOUTH EVERY DAY (Patient not taking: Reported on 04/05/2022) 90 tablet 1   [DISCONTINUED] esomeprazole (NEXIUM) 20 MG capsule Take 1 capsule (20 mg total) by mouth daily. 30 capsule 0   No current facility-administered medications on file prior to visit.     Allergies: No Known Allergies  Family History: Migraine or other headaches in the family:  mother Aneurysms in a first degree relative:  no Brain tumors in the family:   no Other neurological illness in the family:   father had a stroke  Past Medical History: Past Medical History:  Diagnosis Date   Abnormal thyroid blood test    Mildly elevated TSH, resolved.   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 Anson Fret to evaluate anemia; pathology showed a tubulovillous adenoma with no high grade dysplasia or malignancy identified.     Anemia    Anxiety    Acute stress reaction to multiple ICD shocks   Appendicitis, acute, s/p apendectomy 01/17/2013   Atrial fibrillation ([place])    S/P radiofrequency ablation by Dr. Adrian Prows at [facility] on 02/09/2008 and again on 03/31/2009; S/P ablation at [facility] by Dr. Westly Pam on 08/10/2010, followed by recurrent atrial flutter/fibrillation.  Cardiac cath done on 02/19/2011 at [facility] showed no significant coronary disease.  Patient underwent Convergent Procedure at [facility] on 03/07/2011.    Atrial tachycardia    Status post convergent ablation [facility] October 2012.  She underwent successful catheter ablation of persistent atrial tachycardia at [facility] by Dr. Willis Modena on 05/24/2014.     CHF (congestive heart failure) ([place])    per patient "sees Dr. Princella Pellegrini"   Cholecystitis, acute 07/2001   S/P laparoscopic cholecystectomy, intraoperative cholangiogram, and transcystic common bile duct exploration by Dr. 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    Intra-abdominal abscess ([place]) 04/11/2019  Pelvic mass    Pelvic ultrasound done on 11/30/10 to evaluate abdominal pain showed,  positioned between the ovaries and contiguous with the ovaries, a complex mass demonstrating posterior acoustical enhancement and no definite intralesional flow with color Doppler exam measuring 5.2 x 3.1 x 5.5 cm. The appearance raised the possibility of a ruptured ovarian cyst with subsequent development of a hematoma.    PONV (postoperative nausea and vomiting)    per patient about 11 years ago but hasn't happened since then   Presence of permanent cardiac pacemaker    per patient "pacemaker and defibrillator"   Seasonal allergic rhinitis    Ventricular tachycardia ([place])    Hx of nonsustained ventricular tachycardia, S/P hospitalization in March 2009; S/P ICD placement by Dr. Deboraha Sprang on 09/25/2007.    Past Surgical History Past Surgical History:  Procedure Laterality Date   APPENDECTOMY     BREAST LUMPECTOMY WITH RADIOACTIVE SEED LOCALIZATION Left 07/07/2020   Procedure: LEFT BREAST LUMPECTOMY WITH RADIOACTIVE SEED LOCALIZATION;  Surgeon: Erroll Luna, MD;  Location: [place];  Service: General;  Laterality: Left;   CARDIAC DEFIBRILLATOR PLACEMENT  09/25/2007; 08/2013   Placed by Dr. Deboraha Sprang on 09/25/2007; gen change 08/2013 by Dr Caryl Comes (STJ dual chamber ICD)   CHOLECYSTECTOMY     Convergent Ablation  03/07/2011   Patient underwent Convergent Procedure at [facility] on 03/07/2011.   FLEXIBLE SIGMOIDOSCOPY  05/26/2011   Procedure: FLEXIBLE SIGMOIDOSCOPY;  Surgeon: Lear Ng, MD;  Location: [facility] ENDOSCOPY;  Service: Endoscopy;  Laterality: N/A;   IMPLANTABLE CARDIOVERTER DEFIBRILLATOR (ICD) GENERATOR CHANGE N/A 08/12/2013   Procedure: ICD GENERATOR CHANGE;  Surgeon:  Deboraha Sprang, MD;  Location: [facility] CATH LAB;  Service: Cardiovascular;  Laterality: N/A;   LAPAROSCOPIC APPENDECTOMY N/A 01/17/2013   Procedure: APPENDECTOMY LAPAROSCOPIC;  Surgeon: Rolm Bookbinder, MD;  Location: [place];  Service: General;  Laterality: N/A;   LAPAROSCOPIC CHOLECYSTECTOMY  07/14/2001   S/P laparoscopic cholecystectomy, intraoperative cholangiogram, and transcystic common bile duct exploration by Dr. Jackolyn Confer on 07/14/2001   RADIOFREQUENCY ABLATION  08/10/2010   ablation  done at [place]  03/11/2009   ablation at [place]  01/10/2008   ablation at [place]  05/24/2014   Successful radiofrequency catheter ablation by Dr. Willis Modena at [facility].    Social History: Social History   Tobacco Use   Smoking status: Never   Smokeless tobacco: Never  Vaping Use   Vaping Use: Never used  Substance Use Topics   Alcohol use: Not Currently    Comment: 1 a week   Drug use: No     ROS: Negative for fevers, chills. Positive for headaches. All other systems reviewed and negative unless stated otherwise in HPI.   Physical Exam:   Vital Signs: BP 110/79   Pulse 63   Ht '5\' 5"'$  (1.651 m)   Wt 167 lb (75.8 kg)   LMP 10/06/2011   BMI 27.79 kg/m  GENERAL: well appearing,in no acute distress,alert SKIN:  Color, texture, turgor normal. No rashes or lesions HEAD:  Normocephalic/atraumatic. CV:  RRR RESP: Normal respiratory effort MSK: +tenderness to palpation over right occiput  NEUROLOGICAL: Mental Status: Alert, oriented to person, place and time,Follows commands Cranial Nerves: PERRL, visual fields intact to confrontation, extraocular movements intact, facial sensation intact, no facial droop or ptosis, hearing grossly intact, no dysarthria, palate elevate symmetrically, tongue protrudes midline, shoulder shrug intact and symmetric Motor: muscle strength 5/5 both upper  and lower extremities,no drift,  normal tone Reflexes: 2+ throughout Sensation: intact to light touch all 4 extremities Coordination: Finger-to- nose-finger intact bilaterally Gait: normal-based   IMPRESSION: 56 year old female with a history of HOCM, Afib, Vtach s/p ICD who presents for evaluation of right-sided headaches. Exam with occipital notch tenderness, suggestive of occipital neuralgia. Discussed treatment options including neck PT, which she declined at this time. She would prefer not to start a daily medication. Will start baclofen as needed for her headaches. If muscle relaxers are ineffective could consider PRN gepant as headaches do have some migrainous features (photophobia and nausea). Would avoid triptans with her history of arrhythmias requiring ICD.  PLAN: -Start baclofen 5-10 mg PRN for headaches -Supplement information for headache prevention provided  -Next steps: consider PRN gepant if muscle relaxers are ineffective, consider neck PT   I spent a total of 28 minutes chart reviewing and counseling the patient. Headache education was done. Discussed treatment options including preventive  medications, natural supplements, and physical therapy.  Written educational materials and patient instructions outlining all of the above were given.  Follow-up: 6 months   Genia Harold, MD 04/05/2022   10:50 AM

## 2022-04-05 ENCOUNTER — Ambulatory Visit (INDEPENDENT_AMBULATORY_CARE_PROVIDER_SITE_OTHER): Payer: BC Managed Care – PPO | Admitting: Psychiatry

## 2022-04-05 VITALS — BP 110/79 | HR 63 | Ht 65.0 in | Wt 167.0 lb

## 2022-04-05 DIAGNOSIS — M5481 Occipital neuralgia: Secondary | ICD-10-CM | POA: Diagnosis not present

## 2022-04-05 MED ORDER — BACLOFEN 10 MG PO TABS: ORAL_TABLET | ORAL | 5 refills | Status: DC

## 2022-04-05 NOTE — Patient Instructions (Addendum)
Natural supplements that can headaches: Magnesium Oxide or Magnesium Glycinate 500 mg at bed (up to 800 mg daily) Coenzyme Q10 300 mg in AM Vitamin B2- 200 mg twice a day  Add 1 supplement at a time since even natural supplements can have undesirable side effects. You can sometimes buy supplements cheaper (especially Coenzyme Q10) at www.https://compton-perez.com/ or at [employer].  Magnesium: Magnesium (250 mg twice a day or 500 mg at bed) has a relaxant effect on smooth muscles such as blood vessels. Three trials found 40-90% average headache reduction  when used as a preventative.  Magnesium is part of the messenger system in the serotonin cascade and it is a good muscle relaxant.  It is also useful for constipation. Good sources include nuts, whole grains, and tomatoes. Side Effects: loose stool/diarrhea  Riboflavin (vitamin B 2) 200 mg twice a day. This vitamin assists nerve cells in the production of ATP a principal energy storing molecule.  It is necessary for many chemical reactions in the body.   The supplement is found in bread, cereal, milk, meat, and poultry.  Most Americans get more riboflavin than the recommended daily allowance, however riboflavin deficiency is not necessary for the supplements to help prevent headache. Side effects: energizing, green urine  Coenzyme Q10: This is present in almost all cells in the body and is critical component for the conversion of energy.  Recent studies have shown that a nutritional supplement of CoQ10 can reduce the frequency of headaches by improving the energy production of cells as with riboflavin.  Doses of 150 mg twice a day have been shown to be effective.

## 2022-06-28 ENCOUNTER — Ambulatory Visit: Payer: BC Managed Care – PPO | Admitting: Internal Medicine

## 2022-06-28 ENCOUNTER — Encounter: Payer: Self-pay | Admitting: Internal Medicine

## 2022-06-28 ENCOUNTER — Other Ambulatory Visit: Payer: Self-pay

## 2022-06-28 VITALS — BP 101/63 | HR 88 | Temp 97.6°F | Ht 65.0 in | Wt 166.9 lb

## 2022-06-28 DIAGNOSIS — I480 Paroxysmal atrial fibrillation: Secondary | ICD-10-CM

## 2022-06-28 DIAGNOSIS — I421 Obstructive hypertrophic cardiomyopathy: Secondary | ICD-10-CM | POA: Diagnosis not present

## 2022-06-28 DIAGNOSIS — M62838 Other muscle spasm: Secondary | ICD-10-CM | POA: Diagnosis not present

## 2022-06-28 DIAGNOSIS — I5042 Chronic combined systolic (congestive) and diastolic (congestive) heart failure: Secondary | ICD-10-CM

## 2022-06-28 DIAGNOSIS — Z7901 Long term (current) use of anticoagulants: Secondary | ICD-10-CM

## 2022-06-28 NOTE — Progress Notes (Signed)
Established Patient Office Visit  Subjective   Patient ID: [name], female    DOB: 11-19-1965  Age: 57 y.o. MRN: [id number]  Chief Complaint  Patient presents with   Check-up Visit   [name] returns today for follow-up of her hypertrophic cardiomyopathy, A-fib.  I last saw her December 2021 as she is gets a lot of her care from her cardiologist and electrophysiologist at [facility] as her majority primary conditions are cardiac related.  We did review her problem list and medications together and reconciled them today we went over her overdue health maintenance issues.  And followed up a few things from previously she has lost some weight her BMI is now 27.  She underwent successful lumpectomy for the intraductal papilloma and has no recurrent issues from that.  She has been having some neck pain she saw neurology for this she was prescribed baclofen but she thinks it has not really been helping too much she would probably go back to her chiropractor for some additional relief from her neck spasm.  She does see a masseuse but will focus on just her back.  She also saw her OB/GYN who obtained a lipid panel and forwarded on to me she does have some mild hyperlipidemia however ASCVD risk score is low around 2%.    Patient Active Problem List   Diagnosis Date Noted   Neck muscle spasm 06/29/2022   Healthcare maintenance 03/13/2018   Obesity, Class I, BMI 30-34.9 12/09/2014   Hyperglycemia 08/26/2013   Chronic combined systolic and diastolic congestive heart failure ([place]) 05/20/2012   Long term current use of anticoagulant therapy 06/27/2010   Anxiety 03/01/2010   Hypertrophic obstructive cardiomyopathy ([place]) 09/03/2008   VENTRICULAR TACHYCARDIA 09/03/2008   Atrial tachycardia 09/03/2008   Atrial fibrillation ([place]) 09/30/2007   ALLERGIC RHINITIS, SEASONAL 09/30/2007   Automatic implantable cardioverter-defibrillator in situ 09/25/2007   Past Medical History:  Diagnosis Date   Abnormal  thyroid blood test    Mildly elevated TSH, resolved.   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 Anson Fret to evaluate anemia; pathology showed a tubulovillous adenoma with no high grade dysplasia or malignancy identified.     Anemia    Anxiety    Acute stress reaction to multiple ICD shocks   Appendicitis, acute, s/p apendectomy 01/17/2013   Atrial fibrillation ([place])    S/P radiofrequency ablation by Dr. Adrian Prows at [facility] on 02/09/2008 and again on 03/31/2009; S/P ablation at [facility] by Dr. Westly Pam on 08/10/2010, followed by recurrent atrial flutter/fibrillation.  Cardiac cath done on 02/19/2011 at [facility] showed no significant coronary disease.  Patient underwent Convergent Procedure at [facility] on 03/07/2011.    Atrial tachycardia    Status post convergent ablation [facility] October 2012.  She underwent successful catheter ablation of persistent atrial tachycardia at [facility] by Dr. Willis Modena on 05/24/2014.     CHF (congestive heart failure) ([place])    per patient "sees Dr. Princella Pellegrini"   Cholecystitis, acute 07/2001   S/P laparoscopic cholecystectomy, intraoperative cholangiogram, and transcystic common bile duct exploration by Dr. Jackolyn Confer on 07/14/2001   Dizziness    Family history of adverse reaction to anesthesia    per patient "sister had nausea and vomitting"   Fibroids    GERD (gastroesophageal reflux disease)    GI bleed 05/25/2011   Heart murmur    per patient "found at the age of  16"   Hyperglycemia    HYPERGLYCEMIA 12/24/2008   Qualifier: Diagnosis of  By: Marinda Elk MD, Sonia Side     Hypertrophic obstructive cardiomyopathy (HOCM) ([place])    TEE 09/02/2007 showed moderate to severe left ventricular hypertrophy with an interventricular septal dimension of 1.5 cm and posterior wall dimension of 7 cm; there was normal LV systolic function and estimated EF of approximately 60%; there did not appear to be  any obvious outflow tract obstruction.    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 The appearance raised the possibility of a ruptured ovarian cyst with subsequent development of a hematoma.    PONV (postoperative nausea and vomiting)    per patient about 11 years ago but hasn't happened since then   Presence of permanent cardiac pacemaker    per patient "pacemaker and defibrillator"   Seasonal allergic rhinitis    Ventricular tachycardia ([place])    Hx of nonsustained ventricular tachycardia, S/P hospitalization in March 2009; S/P ICD placement by Dr. Deboraha Sprang on 09/25/2007.         Objective:     BP 101/63 (BP Location: Left Arm, Patient Position: Sitting, Cuff Size: Normal)   Pulse 88   Temp 97.6 F (36.4 C) (Oral)   Ht '5\' 5"'$  (1.651 m)   Wt 166 lb 14.4 oz (75.7 kg)   LMP 10/06/2011   SpO2 100% Comment: RA  BMI 27.77 kg/m  BP Readings from Last 3 Encounters:  06/28/22 101/63  04/05/22 110/79  03/14/22 120/82   Wt Readings from Last 3 Encounters:  06/28/22 166 lb 14.4 oz (75.7 kg)  04/05/22 167 lb (75.8 kg)  03/14/22 164 lb (74.4 kg)      Physical Exam Vitals and nursing note reviewed.  Constitutional:      Appearance: Normal appearance.  Cardiovascular:     Rate and Rhythm: Normal rate and regular rhythm.     Pulses: Normal pulses.     Heart sounds: Normal heart sounds.  Pulmonary:     Effort: Pulmonary effort is normal.     Breath sounds: Normal breath sounds.  Musculoskeletal:     Comments: Decreased ROM of cervical spine in  rotation  bilaterally, tenderness of paraspinal musculature R>L of cervical spine  Neurological:     Mental Status: She is alert.      No results found for any visits on 06/28/22.  Last CBC Lab Results  Component Value Date   WBC 7.0 07/05/2020   HGB 14.3 07/05/2020   HCT 43.6 07/05/2020   MCV 87.9 07/05/2020   MCH 28.8 07/05/2020   RDW 13.2 07/05/2020   PLT 271 16/03/9603   Last metabolic panel Lab Results  Component Value Date   GLUCOSE 88 07/05/2020   NA 138 07/05/2020   K 3.8 07/05/2020   CL 97 (L) 07/05/2020   CO2 29 07/05/2020   BUN 31 (H) 07/05/2020   CREATININE 1.09 (H) 07/05/2020   GFRNONAA 60 (L) 07/05/2020   CALCIUM 10.2 07/05/2020   PROT 7.8 07/05/2020   ALBUMIN 4.6 07/05/2020   BILITOT 1.5 (H) 07/05/2020   ALKPHOS  76 07/05/2020   AST 37 07/05/2020   ALT 24 07/05/2020   ANIONGAP 12 07/05/2020   Last lipids Lab Results  Component Value Date   CHOL 206 (H) 03/14/2022   HDL 39 (L) 03/14/2022   LDLCALC 140 (H) 03/14/2022   TRIG 138 03/14/2022   CHOLHDL 5.3 (H) 03/14/2022   Last hemoglobin A1c Lab Results  Component Value Date   HGBA1C 5.8 (A) 03/13/2018      The 10-year ASCVD risk score (Arnett DK, et al., 2019) is: 2.3%    Assessment & Plan:   Problem List Items Addressed This Visit       Cardiovascular and Mediastinum   Hypertrophic obstructive cardiomyopathy ([place]) - Primary (Chronic)    Reviewed records through care everywhere.  Ongoing management per [facility] cardiology      Chronic combined systolic and diastolic congestive heart failure (HCC) (Chronic)    Euvolemic on exam today no symptoms of distant heart failure.      Atrial fibrillation ([place]) (Chronic)    She is now on Xarelto for her anticoagulation.  She remains on Tikosyn and metoprolol reviewed last BMP and appropriate.        Musculoskeletal and Integument   Neck muscle spasm    Advised to continue follow-up with her neurologist they may consider Botox injections.  I  think the masseuse is a good idea I advised some degree of caution with neck manipulation with chiropractor.        Other   Long term current use of anticoagulant therapy    Return in about 1 year (around 06/29/2023).    Lucious Groves, DO

## 2022-06-29 DIAGNOSIS — M62838 Other muscle spasm: Secondary | ICD-10-CM | POA: Insufficient documentation

## 2022-06-29 NOTE — Assessment & Plan Note (Signed)
She is now on Xarelto for her anticoagulation.  She remains on Tikosyn and metoprolol reviewed last BMP and appropriate.

## 2022-06-29 NOTE — Assessment & Plan Note (Signed)
Euvolemic on exam today no symptoms of distant heart failure.

## 2022-06-29 NOTE — Assessment & Plan Note (Signed)
Advised to continue follow-up with her neurologist they may consider Botox injections.  I think the masseuse is a good idea I advised some degree of caution with neck manipulation with chiropractor.

## 2022-06-29 NOTE — Assessment & Plan Note (Signed)
Reviewed records through care everywhere.  Ongoing management per [facility] cardiology

## 2022-07-30 ENCOUNTER — Ambulatory Visit (INDEPENDENT_AMBULATORY_CARE_PROVIDER_SITE_OTHER): Payer: BC Managed Care – PPO | Admitting: Psychiatry

## 2022-07-30 ENCOUNTER — Encounter: Payer: Self-pay | Admitting: Psychiatry

## 2022-07-30 VITALS — BP 102/60 | HR 60 | Ht 66.0 in | Wt 165.5 lb

## 2022-07-30 DIAGNOSIS — M542 Cervicalgia: Secondary | ICD-10-CM | POA: Diagnosis not present

## 2022-07-30 DIAGNOSIS — M5481 Occipital neuralgia: Secondary | ICD-10-CM | POA: Diagnosis not present

## 2022-07-30 MED ORDER — GABAPENTIN 300 MG PO CAPS: ORAL_CAPSULE | ORAL | 6 refills | Status: DC

## 2022-07-30 MED ORDER — BACLOFEN 10 MG PO TABS: ORAL_TABLET | ORAL | 5 refills | Status: DC

## 2022-07-30 NOTE — Progress Notes (Signed)
   CC:  headaches  Follow-up Visit  Last visit: 04/05/22  Brief HPI: 57 year old female with a history of HOCM, Afib, Vtach s/p ICD who follows in clinic for right sided occipital neuralgia.  At her last visit she was started on baclofen as needed.  Interval History: She continues to have daily right sided headaches and neck pain. Pain will often radiate behind her right eye, and can radiate down her shoulder as well. She denies upper extremity numbness or weakness. Baclofen helps decrease the severity of the pain, but she has to take Tylenol with it to be effective. She has been taking Tylenol and baclofen multiple times per day every day.  Headache days per month: 30 Headache free days per month: 0  Current Headache Regimen: Preventative: none Abortive: baclofen 5-10 mg TID PRN   Prior Therapies                                  Can't take NSAIDs as she is on blood thinners Tylenol Baclofen 5-10 mg TID PRN Metoprolol 50 mg daily Zoloft 150 mg daily  Physical Exam:   Vital Signs: BP 102/60 (BP Location: Left Arm, Patient Position: Sitting, Cuff Size: Normal)   Pulse 60   Ht 5' 6"$  (1.676 m)   Wt 165 lb 8 oz (75.1 kg)   LMP 10/06/2011   BMI 26.71 kg/m  GENERAL:  well appearing, in no acute distress, alert  SKIN:  Color, texture, turgor normal. No rashes or lesions HEAD:  Normocephalic/atraumatic. RESP: normal respiratory effort MSK:  tenderness to palpation over right occiput and neck  NEUROLOGICAL: Mental Status: Alert, oriented to person, place and time, Follows commands, and Speech fluent and appropriate. Cranial Nerves: PERRL, face symmetric, no dysarthria, hearing grossly intact Motor: moves all extremities equally Gait: normal-based.  IMPRESSION: 57 year old female with a history of HOCM, Afib, Vtach s/p ICD who presents for follow up of right sided occipital neuralgia. She continues to have daily pain despite treatment with baclofen. She does not report  radicular symptoms at this time. Will refer to neck PT and start gabapentin for headache prevention. Discussed occipital nerve block as a next step if no improvement with gabapentin and PT.  PLAN: -Start gabapentin 300 mg QHS x1 week, then increase to 300 mg BID -Continue baclofen 5-10 mg TID PRN -Referral to neck PT -Next steps: consider occipital nerve block, lyrica, PRN gepant  Follow-up: 7 months  I spent a total of 28 minutes on the date of the service. Headache education was done.  Discussed treatment options including preventive and acute medications, nerve block, and physical therapy. Discussed medication side effects, adverse reactions and drug interactions. Written educational materials and patient instructions outlining all of the above were given.  Genia Harold, MD 07/30/22 10:54 AM

## 2022-07-30 NOTE — Patient Instructions (Addendum)
Start gabapentin 300 mg at bedtime. Take this for one week, then if you are tolerating it without side effects you may increase to 300 mg twice a day. Please let me know if you experience significant drowsiness or dizziness at this dose  Continue baclofen up to 3 times a day as needed  Referral to physical therapy for the neck

## 2022-08-07 ENCOUNTER — Other Ambulatory Visit: Payer: Self-pay

## 2022-08-07 ENCOUNTER — Encounter: Payer: Self-pay | Admitting: Physical Therapy

## 2022-08-07 ENCOUNTER — Ambulatory Visit: Payer: BC Managed Care – PPO | Attending: Psychiatry | Admitting: Physical Therapy

## 2022-08-07 VITALS — BP 104/66 | HR 60

## 2022-08-07 DIAGNOSIS — R252 Cramp and spasm: Secondary | ICD-10-CM | POA: Diagnosis present

## 2022-08-07 DIAGNOSIS — M542 Cervicalgia: Secondary | ICD-10-CM | POA: Diagnosis present

## 2022-08-07 NOTE — Therapy (Unsigned)
OUTPATIENT PHYSICAL THERAPY CERVICAL EVALUATION   Patient Name: [name] MRN: [id number] DOB:December 24, 1965, 57 y.o., female Today's Date: 08/07/2022  END OF SESSION:  PT End of Session - 08/07/22 1527     Visit Number 1    Authorization Type BLUE CROSS BLUE SHIELD    PT Start Time 1523    Behavior During Therapy WFL for tasks assessed/performed             Past Medical History:  Diagnosis Date   Abnormal thyroid blood test    Mildly elevated TSH, resolved.   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 Anson Fret to evaluate anemia; pathology showed a tubulovillous adenoma with no high grade dysplasia or malignancy identified.     Anemia    Anxiety    Acute stress reaction to multiple ICD shocks   Appendicitis, acute, s/p apendectomy 01/17/2013   Atrial fibrillation ([place])    S/P radiofrequency ablation by Dr. Adrian Prows at [facility] on 02/09/2008 and again on 03/31/2009; S/P ablation at [facility] by Dr. Westly Pam on 08/10/2010, followed by recurrent atrial flutter/fibrillation.  Cardiac cath done on 02/19/2011 at [facility] showed no significant coronary disease.  Patient underwent Convergent Procedure at [facility] on 03/07/2011.    Atrial tachycardia    Status post convergent ablation [facility] October 2012.  She underwent successful catheter ablation of persistent atrial tachycardia at [facility] by Dr. Willis Modena on 05/24/2014.     CHF (congestive heart failure) ([place])    per patient "sees Dr. Princella Pellegrini"   Cholecystitis, acute 07/2001   S/P laparoscopic cholecystectomy, intraoperative cholangiogram, and transcystic common bile duct exploration by Dr. Jackolyn Confer on 07/14/2001   Dizziness    Family history of adverse reaction to anesthesia    per patient "sister had nausea and vomitting"   Fibroids    GERD (gastroesophageal reflux disease)    GI bleed 05/25/2011   Heart murmur    per patient "found at  the age of 71"   Hyperglycemia    HYPERGLYCEMIA 12/24/2008   Qualifier: Diagnosis of  By: Marinda Elk MD, Sonia Side     Hypertrophic obstructive cardiomyopathy (HOCM) ([place])    TEE 09/02/2007 showed moderate to severe left ventricular hypertrophy with an interventricular septal dimension of 1.5 cm and posterior wall dimension of 7 cm; there was normal LV systolic function and estimated EF of approximately 60%; there did not appear to be any obvious outflow tract obstruction.    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 The appearance raised the possibility of a ruptured ovarian cyst with subsequent development of a hematoma.    PONV (postoperative nausea and vomiting)    per patient about 11 years ago but hasn't happened since then   Presence of permanent cardiac pacemaker    per patient "pacemaker and defibrillator"   Seasonal allergic rhinitis    Ventricular tachycardia ([place])    Hx of nonsustained ventricular tachycardia, S/P hospitalization in March 2009; S/P ICD placement by Dr. Deboraha Sprang on 09/25/2007.  Past Surgical History:  Procedure Laterality Date   APPENDECTOMY     BREAST LUMPECTOMY WITH RADIOACTIVE SEED LOCALIZATION Left 07/07/2020   Procedure: LEFT BREAST LUMPECTOMY WITH RADIOACTIVE SEED LOCALIZATION;  Surgeon: Erroll Luna, MD;  Location: [place];  Service: General;  Laterality: Left;   CARDIAC DEFIBRILLATOR PLACEMENT  09/25/2007; 08/2013   Placed by Dr. Deboraha Sprang on 09/25/2007; gen change 08/2013 by Dr Caryl Comes (STJ dual chamber ICD)   CHOLECYSTECTOMY      Convergent Ablation  03/07/2011   Patient underwent Convergent Procedure at [facility] on 03/07/2011.   FLEXIBLE SIGMOIDOSCOPY  05/26/2011   Procedure: FLEXIBLE SIGMOIDOSCOPY;  Surgeon: Lear Ng, MD;  Location: [facility];  Service: Endoscopy;  Laterality: N/A;   IMPLANTABLE CARDIOVERTER DEFIBRILLATOR (ICD) GENERATOR CHANGE N/A 08/12/2013   Procedure: ICD GENERATOR CHANGE;  Surgeon: Deboraha Sprang, MD;  Location: [facility] CATH LAB;  Service: Cardiovascular;  Laterality: N/A;   LAPAROSCOPIC APPENDECTOMY N/A 01/17/2013   Procedure: APPENDECTOMY LAPAROSCOPIC;  Surgeon: Rolm Bookbinder, MD;  Location: [place];  Service: General;  Laterality: N/A;   LAPAROSCOPIC CHOLECYSTECTOMY  07/14/2001   S/P laparoscopic cholecystectomy, intraoperative cholangiogram, and transcystic common bile duct exploration by Dr. Jackolyn Confer on 07/14/2001   RADIOFREQUENCY ABLATION  08/10/2010   ablation  done at [place]  03/11/2009   ablation at [place]  01/10/2008   ablation at [place]  05/24/2014   Successful radiofrequency catheter ablation by Dr. Willis Modena at [facility].   Patient Active Problem List   Diagnosis Date Noted   Neck muscle spasm 06/29/2022   Healthcare maintenance 03/13/2018   Obesity, Class I, BMI 30-34.9 12/09/2014   Hyperglycemia 08/26/2013   Chronic combined systolic and diastolic congestive heart failure ([place]) 05/20/2012   Long term current use of anticoagulant therapy 06/27/2010   Anxiety 03/01/2010   Hypertrophic obstructive cardiomyopathy ([place]) 09/03/2008   VENTRICULAR TACHYCARDIA 09/03/2008   Atrial tachycardia 09/03/2008   Atrial fibrillation ([place]) 09/30/2007   ALLERGIC RHINITIS, SEASONAL 09/30/2007   Automatic implantable cardioverter-defibrillator in situ 09/25/2007    PCP: Lucious Groves, DO  REFERRING PROVIDER: Genia Harold, MD  REFERRING DIAG: M54.2 (ICD-10-CM) - Cervicalgia  THERAPY DIAG:   Cervicalgia  Cramp and spasm  Rationale for Evaluation and Treatment: Rehabilitation  ONSET DATE: Right before retiring in March 2023 (onset of headaches and neck pain)  SUBJECTIVE:  SUBJECTIVE STATEMENT: Pt attributed her 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  October 2023 was patient's first time seeing Dr. Billey Gosling for this issue.  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  She reports she is down from 7 headaches per week to 2.  She sleeps on her right side, but has recently bought a tempurpedic pillow which has helped.  She denies N/T in any extremity.  PERTINENT HISTORY:  Hypertrophic Obstructive Cardiomyopathy, anxiety, Afib, Vtach s/p ICD, right occipital neuralgia w/ right sided headaches, right frozen shoulder  PAIN:  Are you having pain? Yes: NPRS scale: 5/10 Pain location: right neck (sometimes travels to behind the right eye, but since being on Gabapentin and Baclofen it does not do this often) Pain description: intermittent, sharp Aggravating factors: doing a lot of activity Relieving factors: medicines mildly help  PRECAUTIONS: ICD/Pacemaker and Other: long-term anticoagulant use  WEIGHT BEARING RESTRICTIONS: No  FALLS:  Has patient fallen in last 6 months? No  LIVING ENVIRONMENT: Lives with:  partner-Jan Lives in: House/apartment Stairs: Yes: Internal: 14 steps; on left going up and External: 2 steps; none Has following equipment at home: Wheelchair (manual), Shower bench, and Grab bars  OCCUPATION: retired Restaurant manager, fast food  PLOF: [place]: "To get pain down to be able to  do my yard work during yard season."  NEXT MD VISIT: Melchor Amour (08/14/2022) - Cardiology  OBJECTIVE:   DIAGNOSTIC FINDINGS:  No recent relevant imaging  PATIENT SURVEYS:  NDI 24% (12/50)  COGNITION: Overall cognitive status: Within functional limits for tasks assessed  SENSATION: Light touch: WFL  POSTURE: No Significant postural limitations  PALPATION: Not tender-to-palpation   CERVICAL ROM:   Active ROM A/PROM (deg) eval  Flexion WFL  Extension Limited ~25%; pulls on the right side of neck  Right lateral flexion Limited 50%; pt reports pinching on right neck  Left lateral flexion Limited 50%; reports stretching sensation  Right rotation WFL; mild discomfort  Left rotation WFL   (Blank rows = not tested)  UPPER EXTREMITY ROM:  Active ROM Right eval Left eval  Shoulder flexion 160 degrees; mild pain 180 degrees  Shoulder extension    Shoulder abduction WNL; pain WNL  Shoulder adduction    Shoulder extension    Shoulder internal rotation    Shoulder external rotation    Elbow flexion    Elbow extension    Wrist flexion    Wrist extension    Wrist ulnar deviation    Wrist radial deviation    Wrist pronation    Wrist supination     (Blank rows = not tested)  UPPER EXTREMITY MMT:  MMT Right eval Left eval  Shoulder flexion 4+/5 5/5  Shoulder extension    Shoulder abduction 4/5; mild pain in supraspinatus region 4+/5  Shoulder adduction    Shoulder extension    Shoulder internal rotation    Shoulder external rotation    Middle trapezius    Lower trapezius    Elbow flexion    Elbow extension    Wrist flexion    Wrist extension    Wrist ulnar deviation    Wrist radial deviation    Wrist pronation    Wrist supination    Grip strength     (Blank rows =  not tested)  CERVICAL SPECIAL TESTS:  None completed as none relevant to chief complaint.  FUNCTIONAL TESTS:  None relevant to chief complaint  TODAY'S TREATMENT:                                                                                                                               DATE: N/A   PATIENT EDUCATION:  Education details: PT POC, assessments used, and goals to be set. Person educated: Patient Education method: Explanation Education comprehension: verbalized understanding  HOME EXERCISE PROGRAM: To be established.  ASSESSMENT:  CLINICAL IMPRESSION: Patient is a *** y.o. *** who was seen today for physical therapy evaluation and treatment for ***.  Pt has a significant PMH of ***.  Identified impairments include ***.  Evaluation via the following assessment tools: *** indicate fall risk.  They would benefit from skilled PT to address impairments as noted and progress towards long term goals.  OBJECTIVE IMPAIRMENTS: {opptimpairments:25111}.   ACTIVITY LIMITATIONS: {activitylimitations:27494}  PARTICIPATION LIMITATIONS: {participationrestrictions:25113}  PERSONAL FACTORS: {Personal factors:25162} are also affecting patient's functional outcome.   REHAB POTENTIAL: {rehabpotential:25112}  CLINICAL DECISION MAKING: {clinical decision making:25114}  EVALUATION COMPLEXITY: {Evaluation complexity:25115}   GOALS: Goals reviewed with patient? {yes/no:20286}  SHORT TERM GOALS: Target date: ***  *** Baseline: *** Goal status: {GOALSTATUS:25110}  2.  *** Baseline: *** Goal status: {GOALSTATUS:25110}  3.  *** Baseline: *** Goal status: {GOALSTATUS:25110}  4.  *** Baseline: *** Goal status: {GOALSTATUS:25110}  5.  *** Baseline: *** Goal status: {GOALSTATUS:25110}  6.  *** Baseline: *** Goal status: {GOALSTATUS:25110}  LONG TERM GOALS: Target date: ***  *** Baseline: *** Goal status: {GOALSTATUS:25110}  2.  *** Baseline: *** Goal status: {GOALSTATUS:25110}  3.  *** Baseline: *** Goal status: {GOALSTATUS:25110}  4.  *** Baseline: *** Goal status: {GOALSTATUS:25110}  5.  *** Baseline: *** Goal status:  {GOALSTATUS:25110}  6.  *** Baseline: *** Goal status: {GOALSTATUS:25110}   PLAN:  PT FREQUENCY: 1x/week  PT DURATION: 8 weeks  PLANNED INTERVENTIONS: {rehab planned interventions:25118::"Therapeutic exercises","Therapeutic activity","Neuromuscular re-education","Balance training","Gait training","Patient/Family education","Self Care","Joint mobilization"}  PLAN FOR NEXT SESSION: Bary Richard, PT, DPT 08/07/2022, 3:31 PM

## 2022-08-16 ENCOUNTER — Ambulatory Visit: Payer: BC Managed Care – PPO | Attending: Psychiatry | Admitting: Physical Therapy

## 2022-08-16 ENCOUNTER — Encounter: Payer: Self-pay | Admitting: Internal Medicine

## 2022-08-16 DIAGNOSIS — K648 Other hemorrhoids: Secondary | ICD-10-CM

## 2022-08-16 DIAGNOSIS — M542 Cervicalgia: Secondary | ICD-10-CM | POA: Insufficient documentation

## 2022-08-16 DIAGNOSIS — R252 Cramp and spasm: Secondary | ICD-10-CM | POA: Diagnosis present

## 2022-08-16 DIAGNOSIS — K5730 Diverticulosis of large intestine without perforation or abscess without bleeding: Secondary | ICD-10-CM

## 2022-08-16 HISTORY — DX: Diverticulosis of large intestine without perforation or abscess without bleeding: K57.30

## 2022-08-16 HISTORY — DX: Other hemorrhoids: K64.8

## 2022-08-16 NOTE — Therapy (Signed)
OUTPATIENT PHYSICAL THERAPY CERVICAL TREATMENT   Patient Name: [name] MRN: [id number] DOB:[birth year], 57 y.o., female Today's Date: 08/16/2022  END OF SESSION:  PT End of Session - 08/16/22 1149     Visit Number 2    Number of Visits 9   8+eval   Date for PT Re-Evaluation 10/05/22    Authorization Type BLUE [employer]    PT Start Time 1147    PT Stop Time 1225    PT Time Calculation (min) 38 min    Activity Tolerance Patient tolerated treatment well    Behavior During Therapy WFL for tasks assessed/performed               Past Medical History:  Diagnosis Date   Abnormal thyroid blood test    Mildly elevated TSH, resolved.   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 Anson Fret to evaluate anemia; pathology showed a tubulovillous adenoma with no high grade dysplasia or malignancy identified.     Anemia    Anxiety    Acute stress reaction to multiple ICD shocks   Appendicitis, acute, s/p apendectomy 01/17/2013   Atrial fibrillation ([place])    S/P radiofrequency ablation by Dr. Adrian Prows at [facility] on 02/09/2008 and again on 03/31/2009; S/P ablation at [facility] by Dr. Westly Pam on 08/10/2010, followed by recurrent atrial flutter/fibrillation.  Cardiac cath done on 02/19/2011 at [facility] showed no significant coronary disease.  Patient underwent Convergent Procedure at [facility] on 03/07/2011.    Atrial tachycardia    Status post convergent ablation [facility] October 2012.  She underwent successful catheter ablation of persistent atrial tachycardia at [facility] by Dr. Willis Modena on 05/24/2014.     CHF (congestive heart failure) ([place])    per patient "sees Dr. Princella Pellegrini"   Cholecystitis, acute 07/2001   S/P laparoscopic cholecystectomy, intraoperative cholangiogram, and transcystic common bile duct exploration by Dr. Jackolyn Confer on 07/14/2001   Dizziness    Family history of adverse reaction  to anesthesia    per patient "sister had nausea and vomitting"   Fibroids    GERD (gastroesophageal reflux disease)    GI bleed 05/25/2011   Heart murmur    per patient "found at the age of 60"   Hyperglycemia    HYPERGLYCEMIA 12/24/2008   Qualifier: Diagnosis of  By: Marinda Elk MD, Sonia Side     Hypertrophic obstructive cardiomyopathy (HOCM) ([place])    TEE 09/02/2007 showed moderate to severe left ventricular hypertrophy with an interventricular septal dimension of 1.5 cm and posterior wall dimension of 7 cm; there was normal LV systolic function and estimated EF of approximately 60%; there did not appear to be any obvious outflow tract obstruction.    Internal hemorrhoids 08/16/2022   Noted on Colonoscopy 07/05/22. Medium sized   Intra-abdominal abscess ([place]) 04/11/2019   Intraductal papilloma of left breast 05/26/2020   Found on Mammography, Plan for Lumpectomy with Dr Brantley Stage after cardiac clearance   Pelvic mass    Pelvic ultrasound done on 11/30/10 to evaluate abdominal pain showed,  positioned between the ovaries and contiguous with the ovaries, a complex mass demonstrating posterior acoustical enhancement and no definite intralesional flow with color Doppler exam measuring 5.2 x 3.1 x 5.5 cm. The appearance raised the possibility of a ruptured ovarian cyst with subsequent development of a hematoma.    PONV (postoperative nausea and vomiting)    per patient about 11 years  ago but hasn't happened since then   Presence of permanent cardiac pacemaker    per patient "pacemaker and defibrillator"   Seasonal allergic rhinitis    Sigmoid diverticulosis 08/16/2022   Noted on Colonoscopy 07/05/22, few small mouthed   Ventricular tachycardia ([place])    Hx of nonsustained ventricular tachycardia, S/P hospitalization in March 2009; S/P ICD placement by Dr. Deboraha Sprang on 09/25/2007.   Past Surgical History:  Procedure Laterality Date   APPENDECTOMY     BREAST LUMPECTOMY WITH RADIOACTIVE SEED  LOCALIZATION Left 07/07/2020   Procedure: LEFT BREAST LUMPECTOMY WITH RADIOACTIVE SEED LOCALIZATION;  Surgeon: Erroll Luna, MD;  Location: [place];  Service: General;  Laterality: Left;   CARDIAC DEFIBRILLATOR PLACEMENT  09/25/2007; 08/2013   Placed by Dr. Deboraha Sprang on 09/25/2007; gen change 08/2013 by Dr Caryl Comes (STJ dual chamber ICD)   CHOLECYSTECTOMY     Convergent Ablation  03/07/2011   Patient underwent Convergent Procedure at [facility] on 03/07/2011.   FLEXIBLE SIGMOIDOSCOPY  05/26/2011   Procedure: FLEXIBLE SIGMOIDOSCOPY;  Surgeon: Lear Ng, MD;  Location: [facility] ENDOSCOPY;  Service: Endoscopy;  Laterality: N/A;   IMPLANTABLE CARDIOVERTER DEFIBRILLATOR (ICD) GENERATOR CHANGE N/A 08/12/2013   Procedure: ICD GENERATOR CHANGE;  Surgeon: Deboraha Sprang, MD;  Location: [facility] CATH LAB;  Service: Cardiovascular;  Laterality: N/A;   LAPAROSCOPIC APPENDECTOMY N/A 01/17/2013   Procedure: APPENDECTOMY LAPAROSCOPIC;  Surgeon: Rolm Bookbinder, MD;  Location: [place];  Service: General;  Laterality: N/A;   LAPAROSCOPIC CHOLECYSTECTOMY  07/14/2001   S/P laparoscopic cholecystectomy, intraoperative cholangiogram, and transcystic common bile duct exploration by Dr. Jackolyn Confer on 07/14/2001   RADIOFREQUENCY ABLATION  08/10/2010   ablation  done at [place]  03/11/2009   ablation at [place]  01/10/2008   ablation at [place]  05/24/2014   Successful radiofrequency catheter ablation by Dr. Willis Modena at [facility].   Patient Active Problem List   Diagnosis Date Noted   Internal hemorrhoids 08/16/2022   Sigmoid diverticulosis 08/16/2022   Neck muscle spasm 06/29/2022   Healthcare maintenance 03/13/2018   Obesity, Class I, BMI 30-34.9 12/09/2014   Hyperglycemia 08/26/2013   Chronic combined systolic and diastolic congestive heart failure ([place]) 05/20/2012   Long term current use of anticoagulant therapy 06/27/2010   Anxiety 03/01/2010    Hypertrophic obstructive cardiomyopathy ([place]) 09/03/2008   VENTRICULAR TACHYCARDIA 09/03/2008   Atrial tachycardia 09/03/2008   Atrial fibrillation ([place]) 09/30/2007   ALLERGIC RHINITIS, SEASONAL 09/30/2007   Automatic implantable cardioverter-defibrillator in situ 09/25/2007    PCP: Lucious Groves, DO  REFERRING PROVIDER: Genia Harold, MD  REFERRING DIAG: M54.2 (ICD-10-CM) - Cervicalgia  THERAPY DIAG:  Cervicalgia  Cramp and spasm  Rationale for Evaluation and Treatment: Rehabilitation  ONSET DATE: Right before retiring in March 2023 (onset of headaches and neck pain)  SUBJECTIVE:  SUBJECTIVE STATEMENT: Pt reports no falls or acute changes since last visit. Pt reports that she went to the mountains over the weekend and got back Monday, she didn't wear her glasses for a few days while in the mountains. When pt doesn't wear her glasses her pain is improved. Pain is usually at 5/10 with medicine, doing better today after not wearing glasses for a few days. When pt has pain it is above her R eye, in posterior R side of neck, and into R shoulder but doesn't go down her arm.  PERTINENT HISTORY:  Hypertrophic Obstructive Cardiomyopathy, anxiety, Afib, Vtach s/p ICD, right occipital neuralgia w/ right sided headaches, right frozen shoulder  PAIN:  Are you having pain? Yes: NPRS scale: 2-3/10 Pain location: right neck (sometimes travels to behind the right eye, but since being on Gabapentin and Baclofen it does not do this often) Pain description: intermittent, sharp Aggravating factors: doing a lot of activity Relieving factors: medicines mildly help  PRECAUTIONS: ICD/Pacemaker and Other: long-term anticoagulant use  WEIGHT BEARING RESTRICTIONS: No  FALLS:  Has patient fallen in  last 6 months? No  LIVING ENVIRONMENT: Lives with:  partner-Jan Lives in: House/apartment Stairs: Yes: Internal: 14 steps; on left going up and External: 2 steps; none Has following equipment at home: Wheelchair (manual), Shower bench, and Grab bars  OCCUPATION: retired Restaurant manager, fast food  PLOF: [place]: "To get pain down to be able to do my yard work during yard season."  NEXT MD VISIT: Melchor Amour (08/14/2022) - Cardiology  OBJECTIVE:   CERVICAL ROM:   Active ROM A/PROM (deg) eval  Flexion WFL  Extension Limited ~25%; pulls on the right side of neck  Right lateral flexion Limited 50%; pt reports pinching on right neck  Left lateral flexion Limited 50%; reports stretching sensation  Right rotation WFL; mild discomfort  Left rotation WFL   (Blank rows = not tested)   TODAY'S TREATMENT:                                                                                                                              THER EX: Seated R UT stretch 3 x 30 sec each Seated R levator scap stretch 3 x 30 sec each  Added to HEP, see bolded below  MANUAL: Supine suboccipital release 5 x 30 sec each  Added suboccipital release with tennis balls to HEP, see bolded below  THER ACT: Trigger Point Dry-Needling  Treatment instructions: Expect mild to moderate muscle soreness. S/S of pneumothorax if dry needled over a lung field, and to seek immediate medical attention should they occur. Patient verbalized understanding of these instructions and education.  Patient Consent Given: Yes Education handout provided: No; pt declined handout Muscles treated: R suboccipital, R upper trap Treatment response/outcome: deep ache/muscle cramp, muscle twitch detected   Trigger Point Dry Needling  What is Trigger Point Dry Needling (DN)? DN is a physical therapy technique used to treat muscle  pain and dysfunction. Specifically, DN helps deactivate muscle trigger points (muscle  knots).  A thin filiform needle is used to penetrate the skin and stimulate the underlying trigger point. The goal is for a local twitch response (LTR) to occur and for the trigger point to relax. No medication of any kind is injected during the procedure.   What Does Trigger Point Dry Needling Feel Like?  The procedure feels different for each individual patient. Some patients report that they do not actually feel the needle enter the skin and overall the process is not painful. Very mild bleeding may occur. However, many patients feel a deep cramping in the muscle in which the needle was inserted. This is the local twitch response.   How Will I feel after the treatment? Soreness is normal, and the onset of soreness may not occur for a few hours. Typically this soreness does not last longer than two days.  Bruising is uncommon, however; ice can be used to decrease any possible bruising.  In rare cases feeling tired or nauseous after the treatment is normal. In addition, your symptoms may get worse before they get better, this period will typically not last longer than 24 hours.   What Can I do After My Treatment? Increase your hydration by drinking more water for the next 24 hours. You may place ice or heat on the areas treated that have become sore, however, do not use heat on inflamed or bruised areas. Heat often brings more relief post needling. You can continue your regular activities, but vigorous activity is not recommended initially after the treatment for 24 hours. DN is best combined with other physical therapy such as strengthening, stretching, and other therapies.    PATIENT EDUCATION:  Education details: initiated HEP, TPDN Person educated: Patient Education method: Theatre stage manager Education comprehension: verbalized understanding  HOME EXERCISE PROGRAM: Access Code: [email or web address] URL: [email or web address] Date: 08/16/2022 Prepared by: Excell Seltzer  Exercises - Seated Upper Trapezius Stretch  - 1 x daily - 7 x weekly - 1 sets - 5 reps - 30 sec hold - Gentle Levator Scapulae Stretch  - 1 x daily - 7 x weekly - 1 sets - 5 reps - 30 sec hold - Supine Suboccipital Release with Tennis Balls  - 1 x daily - 7 x weekly - 1 sets - 1 reps - 5-10 min hold  ASSESSMENT:  CLINICAL IMPRESSION: Emphasis of skilled PT session on performing TPDN followed by stretching and manual therapy to address tight muscles in R neck and shoulder region. Pt reports she can turn her head more following treatment this date. Pt continues to benefit from skilled therapy services to address pain which is limiting ability to function. Continue POC.    OBJECTIVE IMPAIRMENTS: decreased ROM, decreased strength, hypomobility, increased muscle spasms, impaired flexibility, and pain.   ACTIVITY LIMITATIONS: carrying, lifting, and reach over head  PARTICIPATION LIMITATIONS: yard work  PERSONAL FACTORS: Age, Fitness, Profession, and 1-2 comorbidities: right occipital neuralgia and right prior frozen shoulder w/ remaining deficits  are also affecting patient's functional outcome.   REHAB POTENTIAL: Good  CLINICAL DECISION MAKING: Stable/uncomplicated  EVALUATION COMPLEXITY: Low   GOALS: Goals reviewed with patient? Yes  SHORT TERM GOALS: Target date: 09/07/2022  Pt will be independent with initial cervical strength and stretching HEP for pain management. Baseline: To be established. Goal status: INITIAL  2.  Pt will score </=7/50 or 14% in order to demonstrate subjective improvement in perceived disability related to neck pain.  Baseline: 12/50 or 24% Goal status: INITIAL  LONG TERM GOALS: Target date: 10/05/2022  Pt will be independent with advanced cervical strength, stretching, and mobilization HEP to promote functional improvement and tolerance. Baseline: To be established. Goal status: INITIAL  2.  Pt will report </=1 headache per week to  demonstrate improved quality of life. Baseline: 2 headaches per week. Goal status: INITIAL  3.  Pt will score </=2/50 or 4% in order to demonstrate subjective improvement in perceived disability related to neck pain. Baseline: 12/50 or 24% Goal status: INITIAL  PLAN:  PT FREQUENCY: 1x/week  PT DURATION: 8 weeks  PLANNED INTERVENTIONS: Therapeutic exercises, Therapeutic activity, Neuromuscular re-education, Patient/Family education, Self Care, Joint mobilization, Dry Needling, Moist heat, Taping, Manual therapy, and Re-evaluation  PLAN FOR NEXT SESSION: add to HEP for cervical strength, stretching, and self-mobilization.  Manual traction, soft tissue mobilization, light shoulder strengthening, postural strength. How was DN?; cervical traction, chin tucks, cervical retraction   Excell Seltzer, PT, DPT, CSRS 08/16/2022, 12:41 PM

## 2022-08-22 ENCOUNTER — Ambulatory Visit: Payer: BC Managed Care – PPO | Admitting: Physical Therapy

## 2022-08-22 DIAGNOSIS — M542 Cervicalgia: Secondary | ICD-10-CM

## 2022-08-22 DIAGNOSIS — R252 Cramp and spasm: Secondary | ICD-10-CM

## 2022-08-22 NOTE — Therapy (Signed)
OUTPATIENT PHYSICAL THERAPY CERVICAL TREATMENT   Patient Name: [name] MRN: [id number] DOB:1965-09-02, 57 y.o., female Today's Date: 08/22/2022  END OF SESSION:  PT End of Session - 08/22/22 1535     Visit Number 3    Number of Visits 9   8+eval   Date for PT Re-Evaluation 10/05/22    Authorization Type BLUE [employer]    PT Start Time 1535    PT Stop Time 1615    PT Time Calculation (min) 40 min    Activity Tolerance Patient tolerated treatment well    Behavior During Therapy WFL for tasks assessed/performed                Past Medical History:  Diagnosis Date   Abnormal thyroid blood test    Mildly elevated TSH, resolved.   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 Anson Fret to evaluate anemia; pathology showed a tubulovillous adenoma with no high grade dysplasia or malignancy identified.     Anemia    Anxiety    Acute stress reaction to multiple ICD shocks   Appendicitis, acute, s/p apendectomy 01/17/2013   Atrial fibrillation ([place])    S/P radiofrequency ablation by Dr. Adrian Prows at [facility] on 02/09/2008 and again on 03/31/2009; S/P ablation at [facility] by Dr. Westly Pam on 08/10/2010, followed by recurrent atrial flutter/fibrillation.  Cardiac cath done on 02/19/2011 at [facility] showed no significant coronary disease.  Patient underwent Convergent Procedure at [facility] on 03/07/2011.    Atrial tachycardia    Status post convergent ablation [facility] October 2012.  She underwent successful catheter ablation of persistent atrial tachycardia at [facility] by Dr. Willis Modena on 05/24/2014.     CHF (congestive heart failure) ([place])    per patient "sees Dr. Princella Pellegrini"   Cholecystitis, acute 07/2001   S/P laparoscopic cholecystectomy, intraoperative cholangiogram, and transcystic common bile duct exploration by Dr. Jackolyn Confer on 07/14/2001   Dizziness    Family history of adverse  reaction to anesthesia    per patient "sister had nausea and vomitting"   Fibroids    GERD (gastroesophageal reflux disease)    GI bleed 05/25/2011   Heart murmur    per patient "found at the age of 29"   Hyperglycemia    HYPERGLYCEMIA 12/24/2008   Qualifier: Diagnosis of  By: Marinda Elk MD, Sonia Side     Hypertrophic obstructive cardiomyopathy (HOCM) ([place])    TEE 09/02/2007 showed moderate to severe left ventricular hypertrophy with an interventricular septal dimension of 1.5 cm and posterior wall dimension of 7 cm; there was normal LV systolic function and estimated EF of approximately 60%; there did not appear to be any obvious outflow tract obstruction.    Internal hemorrhoids 08/16/2022   Noted on Colonoscopy 07/05/22. Medium sized   Intra-abdominal abscess ([place]) 04/11/2019   Intraductal papilloma of left breast 05/26/2020   Found on Mammography, Plan for Lumpectomy with Dr Brantley Stage after cardiac clearance   Pelvic mass    Pelvic ultrasound done on 11/30/10 to evaluate abdominal pain showed,  positioned between the ovaries and contiguous with the ovaries, a complex mass demonstrating posterior acoustical enhancement and no definite intralesional flow with color Doppler exam measuring 5.2 x 3.1 x 5.5 cm. The appearance raised the possibility of a ruptured ovarian cyst with subsequent development of a hematoma.    PONV (postoperative nausea and vomiting)    per patient about 11  years ago but hasn't happened since then   Presence of permanent cardiac pacemaker    per patient "pacemaker and defibrillator"   Seasonal allergic rhinitis    Sigmoid diverticulosis 08/16/2022   Noted on Colonoscopy 07/05/22, few small mouthed   Ventricular tachycardia ([place])    Hx of nonsustained ventricular tachycardia, S/P hospitalization in March 2009; S/P ICD placement by Dr. Deboraha Sprang on 09/25/2007.   Past Surgical History:  Procedure Laterality Date   APPENDECTOMY     BREAST LUMPECTOMY WITH RADIOACTIVE SEED  LOCALIZATION Left 07/07/2020   Procedure: LEFT BREAST LUMPECTOMY WITH RADIOACTIVE SEED LOCALIZATION;  Surgeon: Erroll Luna, MD;  Location: [place];  Service: General;  Laterality: Left;   CARDIAC DEFIBRILLATOR PLACEMENT  09/25/2007; 08/2013   Placed by Dr. Deboraha Sprang on 09/25/2007; gen change 08/2013 by Dr Caryl Comes (STJ dual chamber ICD)   CHOLECYSTECTOMY     Convergent Ablation  03/07/2011   Patient underwent Convergent Procedure at [facility] on 03/07/2011.   FLEXIBLE SIGMOIDOSCOPY  05/26/2011   Procedure: FLEXIBLE SIGMOIDOSCOPY;  Surgeon: Lear Ng, MD;  Location: [facility] ENDOSCOPY;  Service: Endoscopy;  Laterality: N/A;   IMPLANTABLE CARDIOVERTER DEFIBRILLATOR (ICD) GENERATOR CHANGE N/A 08/12/2013   Procedure: ICD GENERATOR CHANGE;  Surgeon: Deboraha Sprang, MD;  Location: [facility] CATH LAB;  Service: Cardiovascular;  Laterality: N/A;   LAPAROSCOPIC APPENDECTOMY N/A 01/17/2013   Procedure: APPENDECTOMY LAPAROSCOPIC;  Surgeon: Rolm Bookbinder, MD;  Location: [place];  Service: General;  Laterality: N/A;   LAPAROSCOPIC CHOLECYSTECTOMY  07/14/2001   S/P laparoscopic cholecystectomy, intraoperative cholangiogram, and transcystic common bile duct exploration by Dr. Jackolyn Confer on 07/14/2001   RADIOFREQUENCY ABLATION  08/10/2010   ablation  done at [place]  03/11/2009   ablation at [place]  01/10/2008   ablation at [place]  05/24/2014   Successful radiofrequency catheter ablation by Dr. Willis Modena at [facility].   Patient Active Problem List   Diagnosis Date Noted   Internal hemorrhoids 08/16/2022   Sigmoid diverticulosis 08/16/2022   Neck muscle spasm 06/29/2022   Healthcare maintenance 03/13/2018   Obesity, Class I, BMI 30-34.9 12/09/2014   Hyperglycemia 08/26/2013   Chronic combined systolic and diastolic congestive heart failure ([place]) 05/20/2012   Long term current use of anticoagulant therapy 06/27/2010   Anxiety 03/01/2010    Hypertrophic obstructive cardiomyopathy ([place]) 09/03/2008   VENTRICULAR TACHYCARDIA 09/03/2008   Atrial tachycardia 09/03/2008   Atrial fibrillation ([place]) 09/30/2007   ALLERGIC RHINITIS, SEASONAL 09/30/2007   Automatic implantable cardioverter-defibrillator in situ 09/25/2007    PCP: Lucious Groves, DO  REFERRING PROVIDER: Genia Harold, MD  REFERRING DIAG: M54.2 (ICD-10-CM) - Cervicalgia  THERAPY DIAG:  Cervicalgia  Cramp and spasm  Rationale for Evaluation and Treatment: Rehabilitation  ONSET DATE: Right before retiring in March 2023 (onset of headaches and neck pain)  SUBJECTIVE:  SUBJECTIVE STATEMENT: Pt reports that overall her pain is a little better. Pt says that if she does the stretches from her HEP when her pain starts they keep her pain from getting worse. Pt also with new onset of tooth abscess on the R side, taking antibiotics and plans to have a root canal. Pt with some pain in her R cheek due to this, also having a headache above her R eye today but also wore her glasses more today.  PERTINENT HISTORY:  Hypertrophic Obstructive Cardiomyopathy, anxiety, Afib, Vtach s/p ICD, right occipital neuralgia w/ right sided headaches, right frozen shoulder  PAIN:  Are you having pain? Yes: NPRS scale: 2-3/10 Pain location: right neck (sometimes travels to behind the right eye, but since being on Gabapentin and Baclofen it does not do this often) Pain description: intermittent, sharp Aggravating factors: doing a lot of activity Relieving factors: medicines mildly help  PRECAUTIONS: ICD/Pacemaker and Other: long-term anticoagulant use  WEIGHT BEARING RESTRICTIONS: No  FALLS:  Has patient fallen in last 6 months? No  LIVING ENVIRONMENT: Lives with:  partner-Jan Lives  in: House/apartment Stairs: Yes: Internal: 14 steps; on left going up and External: 2 steps; none Has following equipment at home: Wheelchair (manual), Shower bench, and Grab bars  OCCUPATION: retired Restaurant manager, fast food  PLOF: [place]: "To get pain down to be able to do my yard work during yard season."  NEXT MD VISIT: Melchor Amour (08/14/2022) - Cardiology  OBJECTIVE:   CERVICAL ROM:   Active ROM A/PROM (deg) eval  Flexion WFL  Extension Limited ~25%; pulls on the right side of neck  Right lateral flexion Limited 50%; pt reports pinching on right neck  Left lateral flexion Limited 50%; reports stretching sensation  Right rotation WFL; mild discomfort  Left rotation WFL   (Blank rows = not tested)   TODAY'S TREATMENT:                                                                                                                              THER EX: Cervical retraction x 10 reps Chin tucks x 10 reps  Added to HEP, see bolded below  MANUAL: Suboccipital release 5 x 30 sec each Cervical distraction with L SB 5 x 30 sec each for R UT stretch   THER ACT: Trigger Point Dry-Needling  Treatment instructions: Expect mild to moderate muscle soreness. S/S of pneumothorax if dry needled over a lung field, and to seek immediate medical attention should they occur. Patient verbalized understanding of these instructions and education.  Patient Consent Given: Yes Education handout provided: No; pt declined handout Muscles treated: R suboccipital, R upper trap Treatment response/outcome: deep ache/muscle cramp, muscle twitch detected; increased sensitivity to treatment this session as compared to previous session     PATIENT EDUCATION:  Education details: added to HEP, TPDN Person educated: Patient Education method: Theatre stage manager Education comprehension: verbalized understanding  HOME EXERCISE PROGRAM: Access Code: [facility]  URL:  [email or web address] Date: 08/16/2022 Prepared by: Excell Seltzer  Exercises - Seated Upper Trapezius Stretch  - 1 x daily - 7 x weekly - 1 sets - 5 reps - 30 sec hold - Gentle Levator Scapulae Stretch  - 1 x daily - 7 x weekly - 1 sets - 5 reps - 30 sec hold - Supine Suboccipital Release with Tennis Balls  - 1 x daily - 7 x weekly - 1 sets - 1 reps - 5-10 min hold - Supine Cervical Retraction with Towel  - 1 x daily - 7 x weekly - 3 sets - 10 reps - Supine Chin Tuck  - 1 x daily - 7 x weekly - 3 sets - 10 reps  ASSESSMENT:  CLINICAL IMPRESSION: Emphasis of skilled PT session on performing TPDN, manual therapy, and cervical strengthening exercises to address ongoing pain. Pt with increased sensitivity to TPDN this date and increased tightness in musculature. Pt continues to benefit from skilled therapy services to address ongoing pain from occipital neuralgia and cervicalgia limiting her ability to function. Continue POC.   OBJECTIVE IMPAIRMENTS: decreased ROM, decreased strength, hypomobility, increased muscle spasms, impaired flexibility, and pain.   ACTIVITY LIMITATIONS: carrying, lifting, and reach over head  PARTICIPATION LIMITATIONS: yard work  PERSONAL FACTORS: Age, Fitness, Profession, and 1-2 comorbidities: right occipital neuralgia and right prior frozen shoulder w/ remaining deficits  are also affecting patient's functional outcome.   REHAB POTENTIAL: Good  CLINICAL DECISION MAKING: Stable/uncomplicated  EVALUATION COMPLEXITY: Low   GOALS: Goals reviewed with patient? Yes  SHORT TERM GOALS: Target date: 09/07/2022  Pt will be independent with initial cervical strength and stretching HEP for pain management. Baseline: To be established. Goal status: INITIAL  2.  Pt will score </=7/50 or 14% in order to demonstrate subjective improvement in perceived disability related to neck pain. Baseline: 12/50 or 24% Goal status: INITIAL  LONG TERM GOALS:  Target date: 10/05/2022  Pt will be independent with advanced cervical strength, stretching, and mobilization HEP to promote functional improvement and tolerance. Baseline: To be established. Goal status: INITIAL  2.  Pt will report </=1 headache per week to demonstrate improved quality of life. Baseline: 2 headaches per week. Goal status: INITIAL  3.  Pt will score </=2/50 or 4% in order to demonstrate subjective improvement in perceived disability related to neck pain. Baseline: 12/50 or 24% Goal status: INITIAL  PLAN:  PT FREQUENCY: 1x/week  PT DURATION: 8 weeks  PLANNED INTERVENTIONS: Therapeutic exercises, Therapeutic activity, Neuromuscular re-education, Patient/Family education, Self Care, Joint mobilization, Dry Needling, Moist heat, Taping, Manual therapy, and Re-evaluation  PLAN FOR NEXT SESSION: add to HEP for cervical strength, stretching, and self-mobilization.  Manual traction, soft tissue mobilization, light shoulder strengthening, postural strength. switch some future appt to TT for DN?   Excell Seltzer, PT, DPT, CSRS 08/22/2022, 4:18 PM

## 2022-08-27 ENCOUNTER — Ambulatory Visit: Payer: BC Managed Care – PPO | Admitting: Physical Therapy

## 2022-08-27 ENCOUNTER — Encounter: Payer: Self-pay | Admitting: Physical Therapy

## 2022-08-27 DIAGNOSIS — M542 Cervicalgia: Secondary | ICD-10-CM | POA: Diagnosis not present

## 2022-08-27 DIAGNOSIS — R252 Cramp and spasm: Secondary | ICD-10-CM

## 2022-08-27 NOTE — Therapy (Unsigned)
OUTPATIENT PHYSICAL THERAPY CERVICAL TREATMENT   Patient Name: [name] MRN: [id number] DOB:1965/10/24, 57 y.o., female Today's Date: 08/27/2022  END OF SESSION:  PT End of Session - 08/27/22 1154     Visit Number 4    Number of Visits 9   8+eval   Date for PT Re-Evaluation 10/05/22    Authorization Type BLUE [employer]OSS [employer]    PT Start Time 1150    PT Stop Time 1234    PT Time Calculation (min) 44 min    Activity Tolerance Patient tolerated treatment well    Behavior During Therapy WFL for tasks assessed/performed                Past Medical History:  Diagnosis Date   Abnormal thyroid blood test    Mildly elevated TSH, resolved.   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 Anson Fret to evaluate anemia; pathology showed a tubulovillous adenoma with no high grade dysplasia or malignancy identified.     Anemia    Anxiety    Acute stress reaction to multiple ICD shocks   Appendicitis, acute, s/p apendectomy 01/17/2013   Atrial fibrillation ([place])    S/P radiofrequency ablation by Dr. Adrian Prows at [facility] on 02/09/2008 and again on 03/31/2009; S/P ablation at [facility] by Dr. Westly Pam on 08/10/2010, followed by recurrent atrial flutter/fibrillation.  Cardiac cath done on 02/19/2011 at [facility] showed no significant coronary disease.  Patient underwent Convergent Procedure at [facility] on 03/07/2011.    Atrial tachycardia    Status post convergent ablation [facility] October 2012.  She underwent successful catheter ablation of persistent atrial tachycardia at [facility] by Dr. Willis Modena on 05/24/2014.     CHF (congestive heart failure) ([place])    per patient "sees Dr. Princella Pellegrini"   Cholecystitis, acute 07/2001   S/P laparoscopic cholecystectomy, intraoperative cholangiogram, and transcystic common bile duct exploration by Dr. Jackolyn Confer on 07/14/2001   Dizziness    Family history of adverse  reaction to anesthesia    per patient "sister had nausea and vomitting"   Fibroids    GERD (gastroesophageal reflux disease)    GI bleed 05/25/2011   Heart murmur    per patient "found at the age of 20"   Hyperglycemia    HYPERGLYCEMIA 12/24/2008   Qualifier: Diagnosis of  By: Marinda Elk MD, Sonia Side     Hypertrophic obstructive cardiomyopathy (HOCM) ([place])    TEE 09/02/2007 showed moderate to severe left ventricular hypertrophy with an interventricular septal dimension of 1.5 cm and posterior wall dimension of 7 cm; there was normal LV systolic function and estimated EF of approximately 60%; there did not appear to be any obvious outflow tract obstruction.    Internal hemorrhoids 08/16/2022   Noted on Colonoscopy 07/05/22. Medium sized   Intra-abdominal abscess ([place]) 04/11/2019   Intraductal papilloma of left breast 05/26/2020   Found on Mammography, Plan for Lumpectomy with Dr Brantley Stage after cardiac clearance   Pelvic mass    Pelvic ultrasound done on 11/30/10 to evaluate abdominal pain showed,  positioned between the ovaries and contiguous with the ovaries, a complex mass demonstrating posterior acoustical enhancement and no definite intralesional flow with color Doppler exam measuring 5.2 x 3.1 x 5.5 cm. The appearance raised the possibility of a ruptured ovarian cyst with subsequent development of a hematoma.    PONV (postoperative nausea and vomiting)    per patient about 11  years ago but hasn't happened since then   Presence of permanent cardiac pacemaker    per patient "pacemaker and defibrillator"   Seasonal allergic rhinitis    Sigmoid diverticulosis 08/16/2022   Noted on Colonoscopy 07/05/22, few small mouthed   Ventricular tachycardia ([place])    Hx of nonsustained ventricular tachycardia, S/P hospitalization in March 2009; S/P ICD placement by Dr. Deboraha Sprang on 09/25/2007.   Past Surgical History:  Procedure Laterality Date   APPENDECTOMY     BREAST LUMPECTOMY WITH RADIOACTIVE SEED  LOCALIZATION Left 07/07/2020   Procedure: LEFT BREAST LUMPECTOMY WITH RADIOACTIVE SEED LOCALIZATION;  Surgeon: Erroll Luna, MD;  Location: [place];  Service: General;  Laterality: Left;   CARDIAC DEFIBRILLATOR PLACEMENT  09/25/2007; 08/2013   Placed by Dr. Deboraha Sprang on 09/25/2007; gen change 08/2013 by Dr Caryl Comes (STJ dual chamber ICD)   CHOLECYSTECTOMY     Convergent Ablation  03/07/2011   Patient underwent Convergent Procedure at [facility] on 03/07/2011.   FLEXIBLE SIGMOIDOSCOPY  05/26/2011   Procedure: FLEXIBLE SIGMOIDOSCOPY;  Surgeon: Lear Ng, MD;  Location: [facility] ENDOSCOPY;  Service: Endoscopy;  Laterality: N/A;   IMPLANTABLE CARDIOVERTER DEFIBRILLATOR (ICD) GENERATOR CHANGE N/A 08/12/2013   Procedure: ICD GENERATOR CHANGE;  Surgeon: Deboraha Sprang, MD;  Location: [facility] CATH LAB;  Service: Cardiovascular;  Laterality: N/A;   LAPAROSCOPIC APPENDECTOMY N/A 01/17/2013   Procedure: APPENDECTOMY LAPAROSCOPIC;  Surgeon: Rolm Bookbinder, MD;  Location: [place];  Service: General;  Laterality: N/A;   LAPAROSCOPIC CHOLECYSTECTOMY  07/14/2001   S/P laparoscopic cholecystectomy, intraoperative cholangiogram, and transcystic common bile duct exploration by Dr. Jackolyn Confer on 07/14/2001   RADIOFREQUENCY ABLATION  08/10/2010   ablation  done at [place]  03/11/2009   ablation at [place]  01/10/2008   ablation at [place]  05/24/2014   Successful radiofrequency catheter ablation by Dr. Willis Modena at [facility].   Patient Active Problem List   Diagnosis Date Noted   Internal hemorrhoids 08/16/2022   Sigmoid diverticulosis 08/16/2022   Neck muscle spasm 06/29/2022   Healthcare maintenance 03/13/2018   Obesity, Class I, BMI 30-34.9 12/09/2014   Hyperglycemia 08/26/2013   Chronic combined systolic and diastolic congestive heart failure ([place]) 05/20/2012   Long term current use of anticoagulant therapy 06/27/2010   Anxiety 03/01/2010    Hypertrophic obstructive cardiomyopathy ([place]) 09/03/2008   VENTRICULAR TACHYCARDIA 09/03/2008   Atrial tachycardia 09/03/2008   Atrial fibrillation ([place]) 09/30/2007   ALLERGIC RHINITIS, SEASONAL 09/30/2007   Automatic implantable cardioverter-defibrillator in situ 09/25/2007    PCP: Lucious Groves, DO  REFERRING PROVIDER: Genia Harold, MD  REFERRING DIAG: M54.2 (ICD-10-CM) - Cervicalgia  THERAPY DIAG:  Cervicalgia  Cramp and spasm  Rationale for Evaluation and Treatment: Rehabilitation  ONSET DATE: Right before retiring in March 2023 (onset of headaches and neck pain)  SUBJECTIVE:  SUBJECTIVE STATEMENT: Pt reports she has been on medication for tooth abscess and is due to have it extracted on 3/26.  She has been able to get in the yard and start some yard work without major aggravations.  She also continues to have shooting pain behind right eye into the neck the longer she wears her glasses, but feels the medicines and exercises have helped isolate the neck pain from the eye pain so that everything does not happen at once.  PERTINENT HISTORY:  Hypertrophic Obstructive Cardiomyopathy, anxiety, Afib, Vtach s/p ICD, right occipital neuralgia w/ right sided headaches, right frozen shoulder  PAIN:  Are you having pain? No  PRECAUTIONS: ICD/Pacemaker and Other: long-term anticoagulant use  WEIGHT BEARING RESTRICTIONS: No  FALLS:  Has patient fallen in last 6 months? No  LIVING ENVIRONMENT: Lives with:  partner-Jan Lives in: House/apartment Stairs: Yes: Internal: 14 steps; on left going up and External: 2 steps; none Has following equipment at home: Wheelchair (manual), Shower bench, and Grab bars  OCCUPATION: retired Restaurant manager, fast food  PLOF: [place]: "To  get pain down to be able to do my yard work during yard season."  NEXT MD VISIT: Melchor Amour (08/14/2022) - Cardiology  OBJECTIVE:   CERVICAL ROM:   Active ROM A/PROM (deg) eval  Flexion WFL  Extension Limited ~25%; pulls on the right side of neck  Right lateral flexion Limited 50%; pt reports pinching on right neck  Left lateral flexion Limited 50%; reports stretching sensation  Right rotation WFL; mild discomfort  Left rotation WFL   (Blank rows = not tested)   TODAY'S TREATMENT:                                                                                                                              THER EX: -Cervical extension SNAG x12, return demo of graded pressure into end range, no change in symptoms -Cervical rotation SNAG x12 each side, corrections for form of rotation vs side-bending, return demo -Supine cervical isometric rotation 5x5sec each side, pt reports relief w/ alternating sides  -Supine cervical flexion 5x5 sec, edu to prevent excessive frontal manual pressure -Supine cervical isometric retraction 5x5sec -Supine cervical isometric lateral bending 5x5sec alternating laterality -Upper cervical extension over towel roll x15, no excessive anterior neck tightness noted -Seated scapular squeeze w/ ER x12 using red theraband -Standing row w/ green theraband x15 -Standing shoulder extension w/ green theraband x12  PATIENT EDUCATION:  Education details: Continue HEP, discussed switching future appt to [place] for DN, pt open to 4/5 even if time slot change.  Education on benefit of supine cervical activity due to forward head posture in sitting. Person educated: Patient Education method: Explanation and Handouts Education comprehension: verbalized understanding  HOME EXERCISE PROGRAM: Access Code: [email or web address] URL: [email or web address] Date: 08/28/2022 Prepared by: Elease Etienne  Exercises - Seated Upper Trapezius Stretch  - 1 x daily -  7 x weekly - 1 sets - 5 reps -  30 sec hold - Gentle Levator Scapulae Stretch  - 1 x daily - 7 x weekly - 1 sets - 5 reps - 30 sec hold - Supine Suboccipital Release with Tennis Balls  - 1 x daily - 7 x weekly - 1 sets - 1 reps - 5-10 min hold - Supine Cervical Retraction with Towel  - 1 x daily - 7 x weekly - 3 sets - 10 reps - Supine Chin Tuck  - 1 x daily - 7 x weekly - 3 sets - 10 reps - Scapular retraction with resistance  - 1 x daily - 7 x weekly - 1 sets - 15 reps - Shoulder extension with resistance - Neutral  - 1 x daily - 7 x weekly - 1 sets - 12 reps  ASSESSMENT:  CLINICAL IMPRESSION: Focus of skilled PT session today on continuing to address cervical muscular activation and muscular balance to limit pain.  She reports mild to moderate improvement in her condition in response to interventions of sessions prior.  Began addressing periscapular strength this session to further support posture and assist in pain management.  Will continue to address deficits as able per ongoing PT POC.  OBJECTIVE IMPAIRMENTS: decreased ROM, decreased strength, hypomobility, increased muscle spasms, impaired flexibility, and pain.   ACTIVITY LIMITATIONS: carrying, lifting, and reach over head  PARTICIPATION LIMITATIONS: yard work  PERSONAL FACTORS: Age, Fitness, Profession, and 1-2 comorbidities: right occipital neuralgia and right prior frozen shoulder w/ remaining deficits  are also affecting patient's functional outcome.   REHAB POTENTIAL: Good  CLINICAL DECISION MAKING: Stable/uncomplicated  EVALUATION COMPLEXITY: Low   GOALS: Goals reviewed with patient? Yes  SHORT TERM GOALS: Target date: 09/07/2022  Pt will be independent with initial cervical strength and stretching HEP for pain management. Baseline: To be established. Goal status: INITIAL  2.  Pt will score </=7/50 or 14% in order to demonstrate subjective improvement in perceived disability related to neck pain. Baseline: 12/50 or  24% Goal status: INITIAL  LONG TERM GOALS: Target date: 10/05/2022  Pt will be independent with advanced cervical strength, stretching, and mobilization HEP to promote functional improvement and tolerance. Baseline: To be established. Goal status: INITIAL  2.  Pt will report </=1 headache per week to demonstrate improved quality of life. Baseline: 2 headaches per week. Goal status: INITIAL  3.  Pt will score </=2/50 or 4% in order to demonstrate subjective improvement in perceived disability related to neck pain. Baseline: 12/50 or 24% Goal status: INITIAL  PLAN:  PT FREQUENCY: 1x/week  PT DURATION: 8 weeks  PLANNED INTERVENTIONS: Therapeutic exercises, Therapeutic activity, Neuromuscular re-education, Patient/Family education, Self Care, Joint mobilization, Dry Needling, Moist heat, Taping, Manual therapy, and Re-evaluation  PLAN FOR NEXT SESSION: add to HEP for cervical strength, stretching, and self-mobilization.  Manual traction, soft tissue mobilization, light shoulder strengthening, postural/periscapular strength-lat pulldowns.  Can try anterior shoulder stretch-R shoulder previously frozen. switch some future appt to [facility] for DN-ask about switching on 4/11, TT off 4/5!   Bary Richard, PT, DPT 08/27/2022, 1:07 PM

## 2022-08-28 NOTE — Patient Instructions (Signed)
Access Code: [email or web address] URL: [email or web address] Date: 08/28/2022 Prepared by: Elease Etienne  Exercises - Seated Upper Trapezius Stretch  - 1 x daily - 7 x weekly - 1 sets - 5 reps - 30 sec hold - Gentle Levator Scapulae Stretch  - 1 x daily - 7 x weekly - 1 sets - 5 reps - 30 sec hold - Supine Suboccipital Release with Tennis Balls  - 1 x daily - 7 x weekly - 1 sets - 1 reps - 5-10 min hold - Supine Cervical Retraction with Towel  - 1 x daily - 7 x weekly - 3 sets - 10 reps - Supine Chin Tuck  - 1 x daily - 7 x weekly - 3 sets - 10 reps - Scapular retraction with resistance  - 1 x daily - 7 x weekly - 1 sets - 15 reps - Shoulder extension with resistance - Neutral  - 1 x daily - 7 x weekly - 1 sets - 12 reps

## 2022-08-30 ENCOUNTER — Ambulatory Visit: Payer: BC Managed Care – PPO | Admitting: Adult Health

## 2022-09-06 ENCOUNTER — Ambulatory Visit: Payer: BC Managed Care – PPO | Admitting: Physical Therapy

## 2022-09-06 ENCOUNTER — Encounter: Payer: Self-pay | Admitting: Physical Therapy

## 2022-09-06 ENCOUNTER — Other Ambulatory Visit: Payer: Self-pay | Admitting: Adult Health

## 2022-09-06 ENCOUNTER — Encounter: Payer: Self-pay | Admitting: Adult Health

## 2022-09-06 ENCOUNTER — Telehealth (INDEPENDENT_AMBULATORY_CARE_PROVIDER_SITE_OTHER): Payer: BC Managed Care – PPO | Admitting: Adult Health

## 2022-09-06 DIAGNOSIS — M542 Cervicalgia: Secondary | ICD-10-CM | POA: Diagnosis not present

## 2022-09-06 DIAGNOSIS — F411 Generalized anxiety disorder: Secondary | ICD-10-CM | POA: Diagnosis not present

## 2022-09-06 DIAGNOSIS — R252 Cramp and spasm: Secondary | ICD-10-CM

## 2022-09-06 MED ORDER — SERTRALINE HCL 50 MG PO TABS: 50.0000 mg | ORAL_TABLET | Freq: Every day | ORAL | 3 refills | Status: DC

## 2022-09-06 NOTE — Therapy (Signed)
OUTPATIENT PHYSICAL THERAPY CERVICAL TREATMENT   Patient Name: [name] MRN: [id number] DOB:1966-02-18, 57 y.o., female Today's Date: 09/06/2022  END OF SESSION:  PT End of Session - 09/06/22 1535     Visit Number 5    Number of Visits 9   8+eval   Date for PT Re-Evaluation 10/05/22    Authorization Type BLUE CROSS BLUE SHIELD    PT Start Time 1532    PT Stop Time 1607    PT Time Calculation (min) 35 min    Activity Tolerance Patient tolerated treatment well    Behavior During Therapy WFL for tasks assessed/performed                Past Medical History:  Diagnosis Date   Abnormal thyroid blood test    Mildly elevated TSH, resolved.   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 Anson Fret to evaluate anemia; pathology showed a tubulovillous adenoma with no high grade dysplasia or malignancy identified.     Anemia    Anxiety    Acute stress reaction to multiple ICD shocks   Appendicitis, acute, s/p apendectomy 01/17/2013   Atrial fibrillation ([place])    S/P radiofrequency ablation by Dr. Adrian Prows at [facility] on 02/09/2008 and again on 03/31/2009; S/P ablation at [facility] by Dr. Westly Pam on 08/10/2010, followed by recurrent atrial flutter/fibrillation.  Cardiac cath done on 02/19/2011 at [facility] showed no significant coronary disease.  Patient underwent Convergent Procedure at [facility] on 03/07/2011.    Atrial tachycardia    Status post convergent ablation [facility] October 2012.  She underwent successful catheter ablation of persistent atrial tachycardia at [facility] by Dr. Willis Modena on 05/24/2014.     CHF (congestive heart failure) ([place])    per patient "sees Dr. Princella Pellegrini"   Cholecystitis, acute 07/2001   S/P laparoscopic cholecystectomy, intraoperative cholangiogram, and transcystic common bile duct exploration by Dr. Jackolyn Confer on 07/14/2001   Dizziness    Family history of adverse  reaction to anesthesia    per patient "sister had nausea and vomitting"   Fibroids    GERD (gastroesophageal reflux disease)    GI bleed 05/25/2011   Heart murmur    per patient "found at the age of 57"   Hyperglycemia    HYPERGLYCEMIA 12/24/2008   Qualifier: Diagnosis of  By: Marinda Elk MD, Sonia Side     Hypertrophic obstructive cardiomyopathy (HOCM) ([place])    TEE 09/02/2007 showed moderate to severe left ventricular hypertrophy with an interventricular septal dimension of 1.5 cm and posterior wall dimension of 7 cm; there was normal LV systolic function and estimated EF of approximately 60%; there did not appear to be any obvious outflow tract obstruction.    Internal hemorrhoids 08/16/2022   Noted on Colonoscopy 07/05/22. Medium sized   Intra-abdominal abscess ([place]) 04/11/2019   Intraductal papilloma of left breast 05/26/2020   Found on Mammography, Plan for Lumpectomy with Dr Brantley Stage after cardiac clearance   Pelvic mass    Pelvic ultrasound done on 11/30/10 to evaluate abdominal pain showed,  positioned between the ovaries and contiguous with the ovaries, a complex mass demonstrating posterior acoustical enhancement and no definite intralesional flow with color Doppler exam measuring 5.2 x 3.1 x 5.5 cm. The appearance raised the possibility of a ruptured ovarian cyst with subsequent development of a hematoma.    PONV (postoperative nausea and vomiting)    per patient about 11  years ago but hasn't happened since then   Presence of permanent cardiac pacemaker    per patient "pacemaker and defibrillator"   Seasonal allergic rhinitis    Sigmoid diverticulosis 08/16/2022   Noted on Colonoscopy 07/05/22, few small mouthed   Ventricular tachycardia ([place])    Hx of nonsustained ventricular tachycardia, S/P hospitalization in March 2009; S/P ICD placement by Dr. Deboraha Sprang on 09/25/2007.   Past Surgical History:  Procedure Laterality Date   APPENDECTOMY     BREAST LUMPECTOMY WITH RADIOACTIVE SEED  LOCALIZATION Left 07/07/2020   Procedure: LEFT BREAST LUMPECTOMY WITH RADIOACTIVE SEED LOCALIZATION;  Surgeon: Erroll Luna, MD;  Location: [place];  Service: General;  Laterality: Left;   CARDIAC DEFIBRILLATOR PLACEMENT  09/25/2007; 08/2013   Placed by Dr. Deboraha Sprang on 09/25/2007; gen change 08/2013 by Dr Caryl Comes (STJ dual chamber ICD)   CHOLECYSTECTOMY     Convergent Ablation  03/07/2011   Patient underwent Convergent Procedure at [facility] on 03/07/2011.   FLEXIBLE SIGMOIDOSCOPY  05/26/2011   Procedure: FLEXIBLE SIGMOIDOSCOPY;  Surgeon: Lear Ng, MD;  Location: [facility] ENDOSCOPY;  Service: Endoscopy;  Laterality: N/A;   IMPLANTABLE CARDIOVERTER DEFIBRILLATOR (ICD) GENERATOR CHANGE N/A 08/12/2013   Procedure: ICD GENERATOR CHANGE;  Surgeon: Deboraha Sprang, MD;  Location: [facility] CATH LAB;  Service: Cardiovascular;  Laterality: N/A;   LAPAROSCOPIC APPENDECTOMY N/A 01/17/2013   Procedure: APPENDECTOMY LAPAROSCOPIC;  Surgeon: Rolm Bookbinder, MD;  Location: [place];  Service: General;  Laterality: N/A;   LAPAROSCOPIC CHOLECYSTECTOMY  07/14/2001   S/P laparoscopic cholecystectomy, intraoperative cholangiogram, and transcystic common bile duct exploration by Dr. Jackolyn Confer on 07/14/2001   RADIOFREQUENCY ABLATION  08/10/2010   ablation  done at [place]  03/11/2009   ablation at [place]  01/10/2008   ablation at [place]  05/24/2014   Successful radiofrequency catheter ablation by Dr. Willis Modena at [facility].   Patient Active Problem List   Diagnosis Date Noted   Internal hemorrhoids 08/16/2022   Sigmoid diverticulosis 08/16/2022   Neck muscle spasm 06/29/2022   Healthcare maintenance 03/13/2018   Obesity, Class I, BMI 30-34.9 12/09/2014   Hyperglycemia 08/26/2013   Chronic combined systolic and diastolic congestive heart failure ([place]) 05/20/2012   Long term current use of anticoagulant therapy 06/27/2010   Anxiety 03/01/2010    Hypertrophic obstructive cardiomyopathy ([place]) 09/03/2008   VENTRICULAR TACHYCARDIA 09/03/2008   Atrial tachycardia 09/03/2008   Atrial fibrillation ([place]) 09/30/2007   ALLERGIC RHINITIS, SEASONAL 09/30/2007   Automatic implantable cardioverter-defibrillator in situ 09/25/2007    PCP: Lucious Groves, DO  REFERRING PROVIDER: Genia Harold, MD  REFERRING DIAG: M54.2 (ICD-10-CM) - Cervicalgia  THERAPY DIAG:  Cervicalgia  Cramp and spasm  Rationale for Evaluation and Treatment: Rehabilitation  ONSET DATE: Right before retiring in March 2023 (onset of headaches and neck pain)  SUBJECTIVE:  SUBJECTIVE STATEMENT: Pt reports she was very sore after a couple of days of dog sitting and house cleaning for a friend.  She did not focus on her HEP too much at that time.    PERTINENT HISTORY:  Hypertrophic Obstructive Cardiomyopathy, anxiety, Afib, Vtach s/p ICD, right occipital neuralgia w/ right sided headaches, right frozen shoulder  PAIN:  Are you having pain? Yes: NPRS scale: 2/10 Pain location: neck Pain description: achy Aggravating factors: looking down Relieving factors: resting in neutral position  PRECAUTIONS: ICD/Pacemaker and Other: long-term anticoagulant use  WEIGHT BEARING RESTRICTIONS: No  FALLS:  Has patient fallen in last 6 months? No  LIVING ENVIRONMENT: Lives with:  partner-Jan Lives in: House/apartment Stairs: Yes: Internal: 14 steps; on left going up and External: 2 steps; none Has following equipment at home: Wheelchair (manual), Shower bench, and Grab bars  OCCUPATION: retired Restaurant manager, fast food  PLOF: [place]: "To get pain down to be able to do my yard work during yard season."  NEXT MD VISIT: Melchor Amour (08/14/2022) -  Cardiology  OBJECTIVE:   CERVICAL ROM:   Active ROM A/PROM (deg) eval  Flexion WFL  Extension Limited ~25%; pulls on the right side of neck  Right lateral flexion Limited 50%; pt reports pinching on right neck  Left lateral flexion Limited 50%; reports stretching sensation  Right rotation WFL; mild discomfort  Left rotation WFL   (Blank rows = not tested)   TODAY'S TREATMENT:                                                                                                                              THER EX: -face pulls (green theraband) x12, correction for UE form -lat pull down (green theraband) x15, correction for angle of UE  -90-90 doorway pec stretch 2x30 seconds each w/ modified RUE ROM and minor stretch noted bilaterally -Seated upper trap stretch x45 seconds each side -Seated levator stretch x45 seconds each side -Verbally reviewed tennis ball mob, pt is using slightly firmer rubber balls at home w/ more success and relief. -Supine cervical retraction w/o towel x10 > w/ towel x10 > upper cervical extension over towel x10 -Supine chin tucks x15 -Supine serratus punch w/ 3lb weight x10 each UE >5lb weight x10 each UE, pt has good form w/ added resistance -Seated scapular retractions x12 w/ green theraband  PATIENT EDUCATION:  Education details: Continue HEP, discussed switching future appt to [facility] for DN, pt open to 4/11 even if time slot change to 1145. Person educated: Patient Education method: Explanation and Handouts Education comprehension: verbalized understanding  HOME EXERCISE PROGRAM: Access Code: [email or web address] URL: [email or web address] Date: 09/06/2022 Prepared by: Elease Etienne  Exercises - Seated Upper Trapezius Stretch  - 1 x daily - 7 x weekly - 1 sets - 5 reps - 30 sec hold - Gentle Levator Scapulae Stretch  - 1 x daily - 7 x weekly - 1 sets - 5 reps - 30  sec hold - Supine Suboccipital Release with Tennis Balls  - 1 x daily - 7 x  weekly - 1 sets - 1 reps - 5-10 min hold - Supine Cervical Retraction with Towel  - 1 x daily - 7 x weekly - 3 sets - 10 reps - Supine Chin Tuck  - 1 x daily - 7 x weekly - 3 sets - 10 reps - Scapular retraction with resistance  - 1 x daily - 7 x weekly - 1 sets - 15 reps - Standing Lat Pull Down with Resistance - Elbows Bent  - 1 x daily - 7 x weekly - 2 sets - 12 reps  ASSESSMENT:  CLINICAL IMPRESSION: Focus of skilled session today on further time spent strengthening the periscapular region as well as reviewing existing HEP with single modification.  She has ongoing tightness in cervical paraspinals and upper trap region with some rebound pain from levator and upper trap stretches.  PT to continue addressing scheduling to have patient dry needled by certified therapist one more time as she has had relief with this in prior sessions.  Will continue POC.  OBJECTIVE IMPAIRMENTS: decreased ROM, decreased strength, hypomobility, increased muscle spasms, impaired flexibility, and pain.   ACTIVITY LIMITATIONS: carrying, lifting, and reach over head  PARTICIPATION LIMITATIONS: yard work  PERSONAL FACTORS: Age, Fitness, Profession, and 1-2 comorbidities: right occipital neuralgia and right prior frozen shoulder w/ remaining deficits  are also affecting patient's functional outcome.   REHAB POTENTIAL: Good  CLINICAL DECISION MAKING: Stable/uncomplicated  EVALUATION COMPLEXITY: Low   GOALS: Goals reviewed with patient? Yes  SHORT TERM GOALS: Target date: 09/07/2022  Pt will be independent with initial cervical strength and stretching HEP for pain management. Baseline: To be established. Goal status: INITIAL  2.  Pt will score </=7/50 or 14% in order to demonstrate subjective improvement in perceived disability related to neck pain. Baseline: 12/50 or 24% Goal status: INITIAL  LONG TERM GOALS: Target date: 10/05/2022  Pt will be independent with advanced cervical strength, stretching,  and mobilization HEP to promote functional improvement and tolerance. Baseline: To be established. Goal status: INITIAL  2.  Pt will report </=1 headache per week to demonstrate improved quality of life. Baseline: 2 headaches per week. Goal status: INITIAL  3.  Pt will score </=2/50 or 4% in order to demonstrate subjective improvement in perceived disability related to neck pain. Baseline: 12/50 or 24% Goal status: INITIAL  PLAN:  PT FREQUENCY: 1x/week  PT DURATION: 8 weeks  PLANNED INTERVENTIONS: Therapeutic exercises, Therapeutic activity, Neuromuscular re-education, Patient/Family education, Self Care, Joint mobilization, Dry Needling, Moist heat, Taping, Manual therapy, and Re-evaluation  PLAN FOR NEXT SESSION:  Manual traction, soft tissue mobilization, light shoulder strengthening, switch some future appt to TT for DN-ask about switching on 4/11 or can we just keep 4/19 appt w/ Lovena Le, TT off 4/5!  STGs!-missed at session prior!   Bary Richard, PT, DPT 09/06/2022, 4:31 PM

## 2022-09-06 NOTE — Patient Instructions (Signed)
Access Code: [email or web address] URL: [email or web address] Date: 09/06/2022 Prepared by: Elease Etienne  Exercises - Seated Upper Trapezius Stretch  - 1 x daily - 7 x weekly - 1 sets - 5 reps - 30 sec hold - Gentle Levator Scapulae Stretch  - 1 x daily - 7 x weekly - 1 sets - 5 reps - 30 sec hold - Supine Suboccipital Release with Tennis Balls  - 1 x daily - 7 x weekly - 1 sets - 1 reps - 5-10 min hold - Supine Cervical Retraction with Towel  - 1 x daily - 7 x weekly - 3 sets - 10 reps - Supine Chin Tuck  - 1 x daily - 7 x weekly - 3 sets - 10 reps - Scapular retraction with resistance  - 1 x daily - 7 x weekly - 1 sets - 15 reps - Standing Lat Pull Down with Resistance - Elbows Bent  - 1 x daily - 7 x weekly - 2 sets - 12 reps

## 2022-09-06 NOTE — Progress Notes (Addendum)
[name] [id number] 01-11-1966 57 y.o.  Virtual Visit via Video Note  I connected with pt @ on 09/06/22 at  02:00 PM EDT by a video enabled telemedicine application and verified that I am speaking with the correct person using two identifiers.   I discussed the limitations of evaluation and management by telemedicine and the availability of in person appointments. The patient expressed understanding and agreed to proceed.  I discussed the assessment and treatment plan with the patient. The patient was provided an opportunity to ask questions and all were answered. The patient agreed with the plan and demonstrated an understanding of the instructions.   The patient was advised to call back or seek an in-person evaluation if the symptoms worsen or if the condition fails to improve as anticipated.  I provided 10 minutes of non-face-to-face time during this encounter.  The patient was located at home.  The provider was located at [place].   Aloha Gell, NP    Subjective:   Patient ID:  [name] is a 57 y.o. (DOB 14-Nov-1965) female.  Chief Complaint: No chief complaint on file.   HPI Jasma L Albaladejo presents to the office today for follow-up of GAD.  Describes mood today as "ok". Pleasant. Mood symptoms - denies depression, anxiety, and irritability. Stating "I'm doing pretty good". Feels like the Zoloft 50mg  continues to work well. Recently retired. Stable interest and motivation. Taking medications as prescribed.  Energy levels stable. Active, does not have a regular exercise routine. Enjoys some usual interests and activities. Single. Lives alone with dog. Sisters local. Spending time with family. Appetite adequate. Weight loss. Sleeps well most nights. Averages 6 to 8 hours. Focus and concentration stable. Completing tasks. Managing aspects of household. Works part time - 1 day a week. Denies SI or HI.  Denies AH or VH.  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  Neurological:  Negative for tremors.  Psychiatric/Behavioral:         Please refer to HPI    Medications: I have reviewed the patient's current medications.  Current Outpatient Medications  Medication Sig Dispense Refill   acetaminophen (TYLENOL) 500 MG tablet Take 500-1,000 mg by mouth every 6 (six) hours as needed for mild pain.     baclofen (LIORESAL) 10 MG tablet Take 0.5-1 pill up to three times a day as needed for headaches 30 each 5   dofetilide (TIKOSYN) 500 MCG capsule Take 500 mcg by mouth 2 (two) times daily.     fluticasone (FLONASE) 50 MCG/ACT nasal spray Place 2 sprays into both nostrils daily as needed for allergies or rhinitis.      gabapentin (NEURONTIN) 300 MG capsule Take 300 mg at bedtime for one week,  then increase to 300 mg twice a day 60 capsule 6   metoprolol succinate (TOPROL-XL) 50 MG 24 hr tablet Take 50 mg by mouth at bedtime.      potassium chloride (KLOR-CON) 10 MEQ tablet Take 10 mEq by mouth daily.     sertraline (ZOLOFT) 50 MG tablet Take by mouth daily.     spironolactone (ALDACTONE) 25 MG tablet Take 25 mg by mouth daily.     torsemide (DEMADEX) 20 MG tablet Take 40 mg by mouth daily.     XARELTO 20 MG TABS  tablet Take 20 mg by mouth daily.     No current facility-administered medications for this visit.    Medication Side Effects: None  Allergies: No Known Allergies  Past Medical History:  Diagnosis Date   Abnormal thyroid blood test    Mildly elevated TSH, resolved.   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 Anson Fret to evaluate anemia; pathology showed a tubulovillous adenoma with no high grade dysplasia or malignancy identified.     Anemia    Anxiety    Acute stress reaction to multiple ICD shocks   Appendicitis, acute, s/p apendectomy 01/17/2013   Atrial fibrillation ([place])    S/P radiofrequency ablation by Dr. Adrian Prows at [facility] on 02/09/2008 and again on 03/31/2009; S/P ablation at [facility] by Dr. Westly Pam on 08/10/2010, followed by recurrent atrial flutter/fibrillation.  Cardiac cath done on 02/19/2011 at [facility] showed no significant coronary disease.  Patient underwent Convergent Procedure at [facility] on 03/07/2011.    Atrial tachycardia    Status post convergent ablation [facility] October 2012.  She underwent successful catheter ablation of persistent atrial tachycardia at [facility] by Dr. Willis Modena on 05/24/2014.     CHF (congestive heart failure) ([place])    per patient "sees Dr. Princella Pellegrini"   Cholecystitis, acute 07/2001   S/P laparoscopic cholecystectomy, intraoperative cholangiogram, and transcystic common bile duct exploration by Dr. Jackolyn Confer on 07/14/2001   Dizziness    Family history of adverse reaction to anesthesia    per patient "sister had nausea and vomitting"   Fibroids    GERD (gastroesophageal reflux disease)    GI bleed 05/25/2011   Heart murmur    per patient "found at the age of 29"   Hyperglycemia    HYPERGLYCEMIA 12/24/2008   Qualifier: Diagnosis of  By: Marinda Elk MD, Sonia Side     Hypertrophic obstructive cardiomyopathy (HOCM) ([place])    TEE 09/02/2007 showed moderate to severe  left ventricular hypertrophy with an interventricular septal dimension of 1.5 cm and posterior wall dimension of 7 cm; there was normal LV systolic function and estimated EF of approximately 60%; there did not appear to be any obvious outflow tract obstruction.    Internal hemorrhoids 08/16/2022   Noted on Colonoscopy 07/05/22. Medium sized   Intra-abdominal abscess ([place]) 04/11/2019   Intraductal papilloma of left breast 05/26/2020   Found on Mammography, Plan for Lumpectomy with Dr Brantley Stage after cardiac clearance   Pelvic mass    Pelvic ultrasound done on 11/30/10 to evaluate abdominal pain showed,  positioned between the ovaries and contiguous with the ovaries, a complex mass demonstrating posterior acoustical enhancement and no definite intralesional flow with color Doppler exam measuring 5.2 x 3.1 x 5.5 cm. The appearance raised the possibility of a ruptured ovarian cyst with subsequent development of a hematoma.    PONV (postoperative nausea and vomiting)  per patient about 11 years ago but hasn't happened since then   Presence of permanent cardiac pacemaker    per patient "pacemaker and defibrillator"   Seasonal allergic rhinitis    Sigmoid diverticulosis 08/16/2022   Noted on Colonoscopy 07/05/22, few small mouthed   Ventricular tachycardia ([place])    Hx of nonsustained ventricular tachycardia, S/P hospitalization in March 2009; S/P ICD placement by Dr. Deboraha Sprang on 09/25/2007.    Past Medical History, Surgical history, Social history, and Family history were reviewed and updated as appropriate.   Please see review of systems for further details on the patient's review from today.   Objective:   Physical Exam:  LMP 10/06/2011   Physical Exam Constitutional:      General: She is not in acute distress. Musculoskeletal:        General: No deformity.  Neurological:     Mental Status: She is alert and oriented to person, place, and time.     Coordination: Coordination normal.   Psychiatric:        Attention and Perception: Attention and perception normal. She does not perceive auditory or visual hallucinations.        Mood and Affect: Mood normal. Mood is not anxious or depressed. Affect is not labile, blunt, angry or inappropriate.        Speech: Speech normal.        Behavior: Behavior normal.        Thought Content: Thought content normal. Thought content is not paranoid or delusional. Thought content does not include homicidal or suicidal ideation. Thought content does not include homicidal or suicidal plan.        Cognition and Memory: Cognition and memory normal.        Judgment: Judgment normal.     Comments: Insight intact     Lab Review:     Component Value Date/Time   NA 138 07/05/2020 1041   K 3.8 07/05/2020 1041   CL 97 (L) 07/05/2020 1041   CO2 29 07/05/2020 1041   GLUCOSE 88 07/05/2020 1041   BUN 31 (H) 07/05/2020 1041   CREATININE 1.09 (H) 07/05/2020 1041   CREATININE 0.91 07/23/2016 1500   CALCIUM 10.2 07/05/2020 1041   PROT 7.8 07/05/2020 1041   PROT 6.4 09/01/2015 1454   ALBUMIN 4.6 07/05/2020 1041   ALBUMIN 4.4 09/01/2015 1454   AST 37 07/05/2020 1041   ALT 24 07/05/2020 1041   ALKPHOS 76 07/05/2020 1041   BILITOT 1.5 (H) 07/05/2020 1041   BILITOT 0.6 09/01/2015 1454   GFRNONAA 60 (L) 07/05/2020 1041   GFRNONAA 80 12/09/2014 0904   GFRAA >60 04/13/2019 0224   GFRAA >89 12/09/2014 0904       Component Value Date/Time   WBC 7.0 07/05/2020 1041   RBC 4.96 07/05/2020 1041   HGB 14.3 07/05/2020 1041   HCT 43.6 07/05/2020 1041   PLT 271 07/05/2020 1041   MCV 87.9 07/05/2020 1041   MCH 28.8 07/05/2020 1041   MCHC 32.8 07/05/2020 1041   RDW 13.2 07/05/2020 1041   LYMPHSABS 0.9 07/05/2020 1041   MONOABS 0.6 07/05/2020 1041   EOSABS 0.1 07/05/2020 1041   BASOSABS 0.0 07/05/2020 1041    No results found for: "POCLITH", "LITHIUM"   No results found for: "PHENYTOIN", "PHENOBARB", "VALPROATE", "CBMZ"   .res Assessment:  Plan:    Plan:  PDMP reviewed  1. Continue Zoloft 50mg  daily  RTC 1 year  Patient advised to contact office with any questions, adverse  effects, or acute worsening in signs and symptoms.  There are no diagnoses linked to this encounter.   Please see After Visit Summary for patient specific instructions.  Future Appointments  Date Time Provider [place]  09/06/2022  3:30 PM Mabeline Caras [facility]  09/14/2022 11:00 AM Bary Richard, PT [facility]  09/20/2022 11:00 AM Bary Richard, PT [facility]  09/28/2022 11:00 AM Excell Seltzer, PT [facility]  10/02/2022 11:45 AM Bary Richard, PT [facility]  03/11/2023 11:00 AM Genia Harold, MD [facility] None    No orders of the defined types were placed in this encounter.   -------------------------------

## 2022-09-14 ENCOUNTER — Ambulatory Visit: Payer: BC Managed Care – PPO | Admitting: Physical Therapy

## 2022-09-20 ENCOUNTER — Encounter: Payer: Self-pay | Admitting: Physical Therapy

## 2022-09-20 ENCOUNTER — Ambulatory Visit: Payer: BC Managed Care – PPO | Attending: Psychiatry | Admitting: Physical Therapy

## 2022-09-20 DIAGNOSIS — M542 Cervicalgia: Secondary | ICD-10-CM | POA: Diagnosis present

## 2022-09-20 DIAGNOSIS — R252 Cramp and spasm: Secondary | ICD-10-CM | POA: Insufficient documentation

## 2022-09-20 NOTE — Patient Instructions (Signed)
Access Code: [email or web address] URL: [email or web address] Date: 09/20/2022 Prepared by: Camille Bal  Exercises - Seated Upper Trapezius Stretch  - 1 x daily - 7 x weekly - 1 sets - 5 reps - 30 sec hold - Gentle Levator Scapulae Stretch  - 1 x daily - 7 x weekly - 1 sets - 5 reps - 30 sec hold - Scapular retraction with resistance  - 1 x daily - 7 x weekly - 1 sets - 15 reps - Standing Lat Pull Down with Resistance - Elbows Bent  - 1 x daily - 7 x weekly - 2 sets - 12 reps

## 2022-09-20 NOTE — Therapy (Signed)
OUTPATIENT PHYSICAL THERAPY CERVICAL TREATMENT   Patient Name: [name] MRN: [id number] DOB:March 04, 1966, 57 y.o., female Today's Date: 09/20/2022  END OF SESSION:  PT End of Session - 09/20/22 1107     Visit Number 6    Number of Visits 9   8+eval   Date for PT Re-Evaluation 10/05/22    Authorization Type BLUE CROSS [employer]    PT Start Time 1104   pt in restroom x4 minutes-not billed   PT Stop Time 1145    PT Time Calculation (min) 41 min    Activity Tolerance Patient tolerated treatment well    Behavior During Therapy WFL for tasks assessed/performed                Past Medical History:  Diagnosis Date   Abnormal thyroid blood test    Mildly elevated TSH, resolved.   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 Herbert Moors to evaluate anemia; pathology showed a tubulovillous adenoma with no high grade dysplasia or malignancy identified.     Anemia    Anxiety    Acute stress reaction to multiple ICD shocks   Appendicitis, acute, s/p apendectomy 01/17/2013   Atrial fibrillation    S/P radiofrequency ablation by Dr. Clydie Braun at [facility] on 02/09/2008 and again on 03/31/2009; S/P ablation at [facility] by Dr. Marchia Bond on 08/10/2010, followed by recurrent atrial flutter/fibrillation.  Cardiac cath done on 02/19/2011 at [facility] showed no significant coronary disease.  Patient underwent Convergent Procedure at [facility] on 03/07/2011.    Atrial tachycardia    Status post convergent ablation [facility] October 2012.  She underwent successful catheter ablation of persistent atrial tachycardia at [facility] by Dr. Herbert Deaner on 05/24/2014.     CHF (congestive heart failure)    per patient "sees Dr. Ramond Marrow"   Cholecystitis, acute 07/2001   S/P laparoscopic cholecystectomy, intraoperative cholangiogram, and transcystic common bile duct exploration by Dr. Avel Peace on 07/14/2001   Dizziness     Family history of adverse reaction to anesthesia    per patient "sister had nausea and vomitting"   Fibroids    GERD (gastroesophageal reflux disease)    GI bleed 05/25/2011   Heart murmur    per patient "found at the age of 23"   Hyperglycemia    HYPERGLYCEMIA 12/24/2008   Qualifier: Diagnosis of  By: Meredith Pel MD, Dorene Sorrow     Hypertrophic obstructive cardiomyopathy (HOCM)    TEE 09/02/2007 showed moderate to severe left ventricular hypertrophy with an interventricular septal dimension of 1.5 cm and posterior wall dimension of 7 cm; there was normal LV systolic function and estimated EF of approximately 60%; there did not appear to be any obvious outflow tract obstruction.    Internal hemorrhoids 08/16/2022   Noted on Colonoscopy 07/05/22. Medium sized   Intra-abdominal abscess 04/11/2019   Intraductal papilloma of left breast 05/26/2020   Found on Mammography, Plan for Lumpectomy with Dr Luisa Hart after cardiac clearance   Pelvic mass    Pelvic ultrasound done on 11/30/10 to evaluate abdominal pain showed,  positioned between the ovaries and contiguous with the ovaries, a complex mass demonstrating posterior acoustical enhancement and no definite intralesional flow with color Doppler exam measuring 5.2 x 3.1 x 5.5 cm. The appearance raised the possibility of a ruptured ovarian cyst with subsequent development of a hematoma.    PONV (postoperative nausea and vomiting)    per  patient about 11 years ago but hasn't happened since then   Presence of permanent cardiac pacemaker    per patient "pacemaker and defibrillator"   Seasonal allergic rhinitis    Sigmoid diverticulosis 08/16/2022   Noted on Colonoscopy 07/05/22, few small mouthed   Ventricular tachycardia    Hx of nonsustained ventricular tachycardia, S/P hospitalization in March 2009; S/P ICD placement by Dr. [facility] Salvia on 09/25/2007.   Past Surgical History:  Procedure Laterality Date   APPENDECTOMY     BREAST LUMPECTOMY WITH  RADIOACTIVE SEED LOCALIZATION Left 07/07/2020   Procedure: LEFT BREAST LUMPECTOMY WITH RADIOACTIVE SEED LOCALIZATION;  Surgeon: Harriette Bouillon, MD;  Location: [facility] OR;  Service: General;  Laterality: Left;   CARDIAC DEFIBRILLATOR PLACEMENT  09/25/2007; 08/2013   Placed by Dr. [facility] Salvia on 09/25/2007; gen change 08/2013 by Dr Graciela Husbands (STJ dual chamber ICD)   CHOLECYSTECTOMY     Convergent Ablation  03/07/2011   Patient underwent Convergent Procedure at [facility] on 03/07/2011.   FLEXIBLE SIGMOIDOSCOPY  05/26/2011   Procedure: FLEXIBLE SIGMOIDOSCOPY;  Surgeon: Shirley Friar, MD;  Location: [facility] ENDOSCOPY;  Service: Endoscopy;  Laterality: N/A;   IMPLANTABLE CARDIOVERTER DEFIBRILLATOR (ICD) GENERATOR CHANGE N/A 08/12/2013   Procedure: ICD GENERATOR CHANGE;  Surgeon: [facility] Salvia, MD;  Location: [facility] CATH LAB;  Service: Cardiovascular;  Laterality: N/A;   LAPAROSCOPIC APPENDECTOMY N/A 01/17/2013   Procedure: APPENDECTOMY LAPAROSCOPIC;  Surgeon: Emelia Loron, MD;  Location: [facility] OR;  Service: General;  Laterality: N/A;   LAPAROSCOPIC CHOLECYSTECTOMY  07/14/2001   S/P laparoscopic cholecystectomy, intraoperative cholangiogram, and transcystic common bile duct exploration by Dr. Avel Peace on 07/14/2001   RADIOFREQUENCY ABLATION  08/10/2010   ablation  done at [facility]   RADIOFREQUENCY ABLATION  03/11/2009   ablation at [facility]   RADIOFREQUENCY ABLATION  01/10/2008   ablation at [facility]   RADIOFREQUENCY ABLATION  05/24/2014   Successful radiofrequency catheter ablation by Dr. Herbert Deaner at [facility].   Patient Active Problem List   Diagnosis Date Noted   Internal hemorrhoids 08/16/2022   Sigmoid diverticulosis 08/16/2022   Neck muscle spasm 06/29/2022   Healthcare maintenance 03/13/2018   Obesity, Class I, BMI 30-34.9 12/09/2014   Hyperglycemia 08/26/2013   Chronic combined systolic and diastolic congestive heart failure 05/20/2012   Long term current use of anticoagulant therapy 06/27/2010   Anxiety  03/01/2010   Hypertrophic obstructive cardiomyopathy 09/03/2008   VENTRICULAR TACHYCARDIA 09/03/2008   Atrial tachycardia 09/03/2008   Atrial fibrillation 09/30/2007   ALLERGIC RHINITIS, SEASONAL 09/30/2007   Automatic implantable cardioverter-defibrillator in situ 09/25/2007    PCP: Gust Rung, DO  REFERRING PROVIDER: Ocie Doyne, MD  REFERRING DIAG: M54.2 (ICD-10-CM) - Cervicalgia  THERAPY DIAG:  Cervicalgia  Cramp and spasm  Rationale for Evaluation and Treatment: Rehabilitation  ONSET DATE: Right before retiring in March 2023 (onset of headaches and neck pain)  SUBJECTIVE:  SUBJECTIVE STATEMENT: Pt is not in pain today and has been doing lots of yard work prior to recent rainy days without issue.  PERTINENT HISTORY:  Hypertrophic Obstructive Cardiomyopathy, anxiety, Afib, Vtach s/p ICD, right occipital neuralgia w/ right sided headaches, right frozen shoulder  PAIN:  Are you having pain? No  PRECAUTIONS: ICD/Pacemaker and Other: long-term anticoagulant use  WEIGHT BEARING RESTRICTIONS: No  FALLS:  Has patient fallen in last 6 months? No  LIVING ENVIRONMENT: Lives with:  partner-Jan Lives in: House/apartment Stairs: Yes: Internal: 14 steps; on left going up and External: 2 steps; none Has following equipment at home: Wheelchair (manual), Shower bench, and Grab bars  OCCUPATION: retired Geologist, engineering  PLOF: Independent  PATIENT GOALS: "To get pain down to be able to do my yard work during yard season."  NEXT MD VISIT: Liborio Nixon (08/14/2022) - Cardiology  OBJECTIVE:   CERVICAL ROM:   Active ROM A/PROM (deg) eval  Flexion WFL  Extension Limited ~25%; pulls on the right side of neck  Right lateral flexion Limited 50%; pt reports pinching on  right neck  Left lateral flexion Limited 50%; reports stretching sensation  Right rotation WFL; mild discomfort  Left rotation WFL   (Blank rows = not tested)   TODAY'S TREATMENT:                                                                                                                              -Verbally reviewed HEP, encouraged anchoring arm for shoulder stretches to increase ROM, printed shortened HEP, pt has begun to incorporate stretches into daily tasks per report. -NDI:  4/50 OR 8% -Seated windmills x15 each side, mild neck discomfort, cues for coordinated cervical and shoulder posture -Forward folds x10, cued for segmental active mobility from lumbar to cervical spine on return to upright -Seated cat/cow x5 -Seated Goddess w/ a twist, emphasis on neck rotation x5 each side -Sun salutations w/ a twist x12 each side  PATIENT EDUCATION:  Education details: Continue HEP w/ modifications, informed pt of 4/19 visit with Ladona Ridgel for DN as desired and appropriate.   Person educated: Patient Education method: Explanation and Handouts Education comprehension: verbalized understanding  HOME EXERCISE PROGRAM: Access Code: [email or web address] URL: [email or web address] Date: 09/06/2022 Prepared by: Camille Bal  Exercises - Seated Upper Trapezius Stretch  - 1 x daily - 7 x weekly - 1 sets - 5 reps - 30 sec hold - Gentle Levator Scapulae Stretch  - 1 x daily - 7 x weekly - 1 sets - 5 reps - 30 sec hold - Supine Suboccipital Release with Tennis Balls  - 1 x daily - 7 x weekly - 1 sets - 1 reps - 5-10 min hold - Supine Cervical Retraction with Towel  - 1 x daily - 7 x weekly - 3 sets - 10 reps - Supine Chin Tuck  - 1 x daily - 7 x weekly - 3 sets - 10 reps - Scapular retraction  with resistance  - 1 x daily - 7 x weekly - 1 sets - 15 reps - Standing Lat Pull Down with Resistance - Elbows Bent  - 1 x daily - 7 x weekly - 2 sets - 12 reps *Bolded removed 4/11!  -Provided  chair yoga handout w/ review of sun salutation w/ twist, goddess w/ twist, cat/cow, and forward folds.  ASSESSMENT:  CLINICAL IMPRESSION: Verbally reviewed and modified HEP based on patient feedback this visit.  She is doing well with managed pain this visit and over recent days.  Added light yoga poses and seated therex for further postural awareness and cervical positional tolerance.  Following therapist will reassess patient for dry needling.  Will continue per POC.  OBJECTIVE IMPAIRMENTS: decreased ROM, decreased strength, hypomobility, increased muscle spasms, impaired flexibility, and pain.   ACTIVITY LIMITATIONS: carrying, lifting, and reach over head  PARTICIPATION LIMITATIONS: yard work  PERSONAL FACTORS: Age, Fitness, Profession, and 1-2 comorbidities: right occipital neuralgia and right prior frozen shoulder w/ remaining deficits  are also affecting patient's functional outcome.   REHAB POTENTIAL: Good  CLINICAL DECISION MAKING: Stable/uncomplicated  EVALUATION COMPLEXITY: Low   GOALS: Goals reviewed with patient? Yes  SHORT TERM GOALS: Target date: 09/07/2022  Pt will be independent with initial cervical strength and stretching HEP for pain management. Baseline: Established, updated, and patient independent w/ incorporation to daily routine. Goal status: MET  2.  Pt will score </=7/50 or 14% on NDI in order to demonstrate subjective improvement in perceived disability related to neck pain. Baseline: 12/50 or 24%; 4/50 OR 8% (4/11) Goal status: MET  LONG TERM GOALS: Target date: 10/05/2022  Pt will be independent with advanced cervical strength, stretching, and mobilization HEP to promote functional improvement and tolerance. Baseline: To be established. Goal status: INITIAL  2.  Pt will report </=1 headache per week to demonstrate improved quality of life. Baseline: 2 headaches per week. Goal status: INITIAL  3.  Pt will score </=2/50 or 4% on NDI in order to  demonstrate subjective improvement in perceived disability related to neck pain. Baseline: 12/50 or 24%; 4/50 OR 8% (4/11) Goal status: INITIAL  PLAN:  PT FREQUENCY: 1x/week  PT DURATION: 8 weeks  PLANNED INTERVENTIONS: Therapeutic exercises, Therapeutic activity, Neuromuscular re-education, Patient/Family education, Self Care, Joint mobilization, Dry Needling, Moist heat, Taping, Manual therapy, and Re-evaluation  PLAN FOR NEXT SESSION:  Manual traction, soft tissue mobilization, light shoulder strengthening, dry needling at 4/19 appt-can d/c if patient would like!   Sadie HaberMarissa B Deneice Wack, PT, DPT 09/20/2022, 3:55 PM

## 2022-09-24 ENCOUNTER — Ambulatory Visit: Payer: BC Managed Care – PPO | Admitting: Psychiatry

## 2022-09-28 ENCOUNTER — Ambulatory Visit: Payer: BC Managed Care – PPO | Admitting: Physical Therapy

## 2022-10-02 ENCOUNTER — Encounter: Payer: Self-pay | Admitting: Physical Therapy

## 2022-10-02 ENCOUNTER — Ambulatory Visit: Payer: BC Managed Care – PPO | Admitting: Physical Therapy

## 2022-10-02 DIAGNOSIS — R252 Cramp and spasm: Secondary | ICD-10-CM

## 2022-10-02 DIAGNOSIS — M542 Cervicalgia: Secondary | ICD-10-CM | POA: Diagnosis not present

## 2022-10-02 NOTE — Therapy (Signed)
OUTPATIENT PHYSICAL THERAPY CERVICAL TREATMENT/DISCHARGE SUMMARY   Patient Name: [name] MRN: [id number] DOB:June 09, 1966, 57 y.o., female Today's Date: 10/02/2022  PHYSICAL THERAPY DISCHARGE SUMMARY  Visits from Start of Care: 7  Current functional level related to goals / functional outcomes: See clinical impression statement.   Remaining deficits: None of note.   Education / Equipment: Discharge plan, progress towards goals, and continue HEP.   Patient agrees to discharge. Patient goals were met. Patient is being discharged due to meeting the stated rehab goals.   END OF SESSION:  PT End of Session - 10/02/22 1149     Visit Number 7    Number of Visits 9   8+eval   Date for PT Re-Evaluation 10/05/22    Authorization Type BLUE [employer]    PT Start Time 1149    PT Stop Time 1201    PT Time Calculation (min) 12 min    Activity Tolerance Patient tolerated treatment well    Behavior During Therapy WFL for tasks assessed/performed                Past Medical History:  Diagnosis Date   Abnormal thyroid blood test    Mildly elevated TSH, resolved.   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 Herbert Moors to evaluate anemia; pathology showed a tubulovillous adenoma with no high grade dysplasia or malignancy identified.     Anemia    Anxiety    Acute stress reaction to multiple ICD shocks   Appendicitis, acute, s/p apendectomy 01/17/2013   Atrial fibrillation    S/P radiofrequency ablation by Dr. Clydie Braun at [facility] on 02/09/2008 and again on 03/31/2009; S/P ablation at [facility] by Dr. Marchia Bond on 08/10/2010, followed by recurrent atrial flutter/fibrillation.  Cardiac cath done on 02/19/2011 at [facility] showed no significant coronary disease.  Patient underwent Convergent Procedure at [facility] on 03/07/2011.    Atrial tachycardia    Status post convergent ablation  [facility] October 2012.  She underwent successful catheter ablation of persistent atrial tachycardia at [facility] by Dr. Herbert Deaner on 05/24/2014.     CHF (congestive heart failure)    per patient "sees Dr. Ramond Marrow"   Cholecystitis, acute 07/2001   S/P laparoscopic cholecystectomy, intraoperative cholangiogram, and transcystic common bile duct exploration by Dr. Avel Peace on 07/14/2001   Dizziness    Family history of adverse reaction to anesthesia    per patient "sister had nausea and vomitting"   Fibroids    GERD (gastroesophageal reflux disease)    GI bleed 05/25/2011   Heart murmur    per patient "found at the age of 75"   Hyperglycemia    HYPERGLYCEMIA 12/24/2008   Qualifier: Diagnosis of  By: Meredith Pel MD, Dorene Sorrow     Hypertrophic obstructive cardiomyopathy (HOCM)    TEE 09/02/2007 showed moderate to severe left ventricular hypertrophy with an interventricular septal dimension of 1.5 cm and posterior wall dimension of 7 cm; there was normal LV systolic function and estimated EF of approximately 60%; there did not appear to be any obvious outflow tract obstruction.    Internal hemorrhoids 08/16/2022   Noted on Colonoscopy 07/05/22. Medium sized   Intra-abdominal abscess 04/11/2019   Intraductal papilloma of left breast 05/26/2020   Found on Mammography, Plan for Lumpectomy with Dr Luisa Hart after cardiac clearance   Pelvic mass    Pelvic ultrasound done on 11/30/10 to evaluate abdominal pain  showed,  positioned between the ovaries and contiguous with the ovaries, a complex mass demonstrating posterior acoustical enhancement and no definite intralesional flow with color Doppler exam measuring 5.2 x 3.1 x 5.5 cm. The appearance raised the possibility of a ruptured ovarian cyst with subsequent development of a hematoma.    PONV (postoperative nausea and vomiting)    per patient about 11 years ago but hasn't happened since then   Presence of permanent cardiac pacemaker    per patient "pacemaker and  defibrillator"   Seasonal allergic rhinitis    Sigmoid diverticulosis 08/16/2022   Noted on Colonoscopy 07/05/22, few small mouthed   Ventricular tachycardia    Hx of nonsustained ventricular tachycardia, S/P hospitalization in March 2009; S/P ICD placement by Dr. [facility] Salvia on 09/25/2007.   Past Surgical History:  Procedure Laterality Date   APPENDECTOMY     BREAST LUMPECTOMY WITH RADIOACTIVE SEED LOCALIZATION Left 07/07/2020   Procedure: LEFT BREAST LUMPECTOMY WITH RADIOACTIVE SEED LOCALIZATION;  Surgeon: Harriette Bouillon, MD;  Location: [facility] OR;  Service: General;  Laterality: Left;   CARDIAC DEFIBRILLATOR PLACEMENT  09/25/2007; 08/2013   Placed by Dr. [facility] Salvia on 09/25/2007; gen change 08/2013 by Dr Graciela Husbands (STJ dual chamber ICD)   CHOLECYSTECTOMY     Convergent Ablation  03/07/2011   Patient underwent Convergent Procedure at [facility] on 03/07/2011.   FLEXIBLE SIGMOIDOSCOPY  05/26/2011   Procedure: FLEXIBLE SIGMOIDOSCOPY;  Surgeon: Shirley Friar, MD;  Location: [facility] ENDOSCOPY;  Service: Endoscopy;  Laterality: N/A;   IMPLANTABLE CARDIOVERTER DEFIBRILLATOR (ICD) GENERATOR CHANGE N/A 08/12/2013   Procedure: ICD GENERATOR CHANGE;  Surgeon: [facility] Salvia, MD;  Location: [facility] CATH LAB;  Service: Cardiovascular;  Laterality: N/A;   LAPAROSCOPIC APPENDECTOMY N/A 01/17/2013   Procedure: APPENDECTOMY LAPAROSCOPIC;  Surgeon: Emelia Loron, MD;  Location: [facility] OR;  Service: General;  Laterality: N/A;   LAPAROSCOPIC CHOLECYSTECTOMY  07/14/2001   S/P laparoscopic cholecystectomy, intraoperative cholangiogram, and transcystic common bile duct exploration by Dr. Avel Peace on 07/14/2001   RADIOFREQUENCY ABLATION  08/10/2010   ablation  done at [facility]   RADIOFREQUENCY ABLATION  03/11/2009   ablation at [facility]   RADIOFREQUENCY ABLATION  01/10/2008   ablation at [facility]   RADIOFREQUENCY ABLATION  05/24/2014   Successful radiofrequency catheter ablation by Dr. Herbert Deaner at [facility].   Patient Active  Problem List   Diagnosis Date Noted   Internal hemorrhoids 08/16/2022   Sigmoid diverticulosis 08/16/2022   Neck muscle spasm 06/29/2022   Healthcare maintenance 03/13/2018   Obesity, Class I, BMI 30-34.9 12/09/2014   Hyperglycemia 08/26/2013   Chronic combined systolic and diastolic congestive heart failure 05/20/2012   Long term current use of anticoagulant therapy 06/27/2010   Anxiety 03/01/2010   Hypertrophic obstructive cardiomyopathy 09/03/2008   VENTRICULAR TACHYCARDIA 09/03/2008   Atrial tachycardia 09/03/2008   Atrial fibrillation 09/30/2007   ALLERGIC RHINITIS, SEASONAL 09/30/2007   Automatic implantable cardioverter-defibrillator in situ 09/25/2007    PCP: Gust Rung, DO  REFERRING PROVIDER: Ocie Doyne, MD  REFERRING DIAG: M54.2 (ICD-10-CM) - Cervicalgia  THERAPY DIAG:  Cervicalgia  Cramp and spasm  Rationale for Evaluation and Treatment: Rehabilitation  ONSET DATE: Right before retiring in March 2023 (onset of headaches and neck pain)  SUBJECTIVE:  SUBJECTIVE STATEMENT: Pt is not in pain today.  She has not had any headaches in the past week and maybe 2 for the entirety of the month.  She feels one was related to not wearing her glasses.  PERTINENT HISTORY:  Hypertrophic Obstructive Cardiomyopathy, anxiety, Afib, Vtach s/p ICD, right occipital neuralgia w/ right sided headaches, right frozen shoulder  PAIN:  Are you having pain? No  PRECAUTIONS: ICD/Pacemaker and Other: long-term anticoagulant use  WEIGHT BEARING RESTRICTIONS: No  FALLS:  Has patient fallen in last 6 months? No  LIVING ENVIRONMENT: Lives with:  partner-Jan Lives in: House/apartment Stairs: Yes: Internal: 14 steps; on left going up and External: 2 steps; none Has following  equipment at home: Wheelchair (manual), Shower bench, and Grab bars  OCCUPATION: retired Geologist, engineering  PLOF: Independent  PATIENT GOALS: "To get pain down to be able to do my yard work during yard season."  NEXT MD VISIT: Liborio Nixon (08/14/2022) - Cardiology  OBJECTIVE:   CERVICAL ROM:   Active ROM A/PROM (deg) eval  Flexion WFL  Extension Limited ~25%; pulls on the right side of neck  Right lateral flexion Limited 50%; pt reports pinching on right neck  Left lateral flexion Limited 50%; reports stretching sensation  Right rotation WFL; mild discomfort  Left rotation WFL   (Blank rows = not tested)   TODAY'S TREATMENT:                                                                                                                              -Verbally reviewed HEP, encouraged anchoring arm for shoulder stretches to increase ROM, printed shortened HEP, pt has begun to incorporate stretches into daily tasks per report. -She is compliant to chair yoga HEP and has current most recently update HEP.  Does not require reprint or review. -NDI:  2/50 OR 4% -PT and patient discuss progress and process for return for dry needling or further management w/ new referral.  PATIENT EDUCATION:  Education details: Continue HEP as needed.  Progress towards goals and plan for discharge. Person educated: Patient Education method: Explanation and Handouts Education comprehension: verbalized understanding  HOME EXERCISE PROGRAM: Access Code: [email or web address] URL: [email or web address] Date: 09/06/2022 Prepared by: Camille Bal  Exercises - Seated Upper Trapezius Stretch  - 1 x daily - 7 x weekly - 1 sets - 5 reps - 30 sec hold - Gentle Levator Scapulae Stretch  - 1 x daily - 7 x weekly - 1 sets - 5 reps - 30 sec hold - Supine Suboccipital Release with Tennis Balls  - 1 x daily - 7 x weekly - 1 sets - 1 reps - 5-10 min hold - Supine Cervical Retraction with Towel  - 1 x daily  - 7 x weekly - 3 sets - 10 reps - Supine Chin Tuck  - 1 x daily - 7 x weekly - 3 sets - 10 reps - Scapular retraction with resistance  - 1 x  daily - 7 x weekly - 1 sets - 15 reps - Standing Lat Pull Down with Resistance - Elbows Bent  - 1 x daily - 7 x weekly - 2 sets - 12 reps  -Provided chair yoga handout w/ review of sun salutation w/ twist, goddess w/ twist, cat/cow, and forward folds.  ASSESSMENT:  CLINICAL IMPRESSION: Patient has had a marked improvement in her neck pain and headaches over recent month.  She has returned to typical activity with little interference and is compliant to HEP in addition to everyday tasks.  Overall, she is doing very well and is in agreement to discharge from PT this visit.  OBJECTIVE IMPAIRMENTS: decreased ROM, decreased strength, hypomobility, increased muscle spasms, impaired flexibility, and pain.   ACTIVITY LIMITATIONS: carrying, lifting, and reach over head  PARTICIPATION LIMITATIONS: yard work  PERSONAL FACTORS: Age, Fitness, Profession, and 1-2 comorbidities: right occipital neuralgia and right prior frozen shoulder w/ remaining deficits  are also affecting patient's functional outcome.   REHAB POTENTIAL: Good  CLINICAL DECISION MAKING: Stable/uncomplicated  EVALUATION COMPLEXITY: Low   GOALS: Goals reviewed with patient? Yes  SHORT TERM GOALS: Target date: 09/07/2022  Pt will be independent with initial cervical strength and stretching HEP for pain management. Baseline: Established, updated, and patient independent w/ incorporation to daily routine. Goal status: MET  2.  Pt will score </=7/50 or 14% on NDI in order to demonstrate subjective improvement in perceived disability related to neck pain. Baseline: 12/50 or 24%; 4/50 OR 8% (4/11) Goal status: MET  LONG TERM GOALS: Target date: 10/05/2022  Pt will be independent with advanced cervical strength, stretching, and mobilization HEP to promote functional improvement and  tolerance. Baseline: compliant and ind (4/23) Goal status: MET  2.  Pt will report </=1 headache per week to demonstrate improved quality of life. Baseline: 2 headaches per week; 0 headaches in recent 7 days (4/23) Goal status: MET  3.  Pt will score </=2/50 or 4% on NDI in order to demonstrate subjective improvement in perceived disability related to neck pain. Baseline: 12/50 or 24%; 4/50 OR 8% (4/11); 2/50 OR 4% Goal status: MET  PLAN:  PT FREQUENCY: 1x/week  PT DURATION: 8 weeks  PLANNED INTERVENTIONS: Therapeutic exercises, Therapeutic activity, Neuromuscular re-education, Patient/Family education, Self Care, Joint mobilization, Dry Needling, Moist heat, Taping, Manual therapy, and Re-evaluation  PLAN FOR NEXT SESSION:  N/A  Sadie Haber, PT, DPT 10/02/2022, 12:44 PM

## 2022-11-05 ENCOUNTER — Encounter: Payer: Self-pay | Admitting: Psychiatry

## 2022-11-06 ENCOUNTER — Other Ambulatory Visit: Payer: Self-pay | Admitting: *Deleted

## 2022-11-06 MED ORDER — BACLOFEN 10 MG PO TABS: ORAL_TABLET | ORAL | 5 refills | Status: DC

## 2023-03-11 ENCOUNTER — Encounter: Payer: Self-pay | Admitting: Psychiatry

## 2023-03-11 ENCOUNTER — Ambulatory Visit: Payer: BC Managed Care – PPO | Admitting: Psychiatry

## 2023-03-11 VITALS — BP 108/74 | HR 64 | Ht 65.0 in | Wt 161.2 lb

## 2023-03-11 DIAGNOSIS — M5481 Occipital neuralgia: Secondary | ICD-10-CM

## 2023-03-11 MED ORDER — BACLOFEN 10 MG PO TABS: ORAL_TABLET | ORAL | 5 refills | Status: AC

## 2023-03-11 NOTE — Progress Notes (Signed)
   CC:  headaches  Follow-up Visit  Last visit: 07/30/22  Brief HPI: 57 year old female with a history of HOCM, Afib, Vtach s/p ICD who follows in clinic for right sided occipital neuralgia.  At her last visit she was referred to neck PT and started on gabapentin.  Interval History: She went to PT in March, which helped a little but didn't fully relieve her pain. She only noticed minimal improvement with gabapentin 600 mg daily, so she stopped it after PT. Baclofen continues to help but she has been having to take it every day. She would prefer to avoid medications if possible. Is interested in trying a nerve block.  Headache days per month: 8 Headache free days per month: 22  Current Headache Regimen: Preventative: none Abortive: baclofen 5-10 mg TID PRN   Prior Therapies                                  Can't take NSAIDs as she is on blood thinners Tylenol Baclofen 5-10 mg TID PRN - helpful Metoprolol 50 mg daily Zoloft 150 mg daily Gabapentin 600 mg daily Neck PT  Physical Exam:   Vital Signs: LMP 10/06/2011  GENERAL:  well appearing, in no acute distress, alert  SKIN:  Color, texture, turgor normal. No rashes or lesions HEAD:  Normocephalic/atraumatic. RESP: normal respiratory effort  NEUROLOGICAL: Mental Status: Alert, oriented to person, place and time, Follows commands, and Speech fluent and appropriate. Cranial Nerves: PERRL, face symmetric, no dysarthria, hearing grossly intact Motor: moves all extremities equally Gait: normal-based.  IMPRESSION: 57 year old female with a history of HOCM, Afib, Vtach s/p ICD who follows in clinic for occipital neuralgia. She has had mild improvement with PT and PRN baclofen. Would prefer not to try any more daily medications if avoidable. She is interested in trying a nerve block. Discussed that today is my last day, and she is not able to come back today for a nerve block. Will [facility] to discuss  occipital nerve block and trigger point injections.  PLAN: -Continue baclofen 5-10 mg TID PRN -Referral to [facility] for occipital nerve block/trigger point injections   Follow-up: as needed  I spent a total of 17 minutes on the date of the service. Headache education was done. Discussed treatment options including preventive and acute medications. Discussed medication side effects, adverse reactions and drug interactions. Written educational materials and patient instructions outlining all of the above were given.  Ocie Doyne, MD 03/11/23 11:33 AM

## 2023-03-12 ENCOUNTER — Telehealth: Payer: Self-pay | Admitting: Psychiatry

## 2023-03-12 NOTE — Telephone Encounter (Signed)
Referral sent to [facility], phone # [phone].

## 2023-06-18 NOTE — Progress Notes (Unsigned)
58 y.o. G0P0000 Single Caucasian female here for annual exam.    No vaginal bleeding.   Same partner for last 2.5 years.   Taking Jardiance to help with her CHF.   Doing injections for headaches, and they are improved.   Planning a visit to [place].   PCP: Gust Rung, DO   Patient's last menstrual period was 10/06/2011.           Sexually active: Yes.    The current method of family planning is post menopausal status.  Female partner. Menopausal hormone therapy:  n/a Exercising: Yes.     walking Smoker:  no  OB History  Gravida Para Term Preterm AB Living  0 0 0 0 0 0  SAB IAB Ectopic Multiple Live Births  0 0 0 0 0     HEALTH MAINTENANCE: Last 2 paps:  03/14/22 neg: HR HPV neg, 07/23/16 neg, neg HR HPV History of abnormal Pap or positive HPV:  no Mammogram:   07/31/22 Breast Density Cat B, BI-RADS CAT 1 neg.  Has an appointment at [facility]  Colonoscopy:  07/05/22 - believes she is due in 2029. Bone Density:  n/a  Result  n/a   Immunization History  Administered Date(s) Administered   PFIZER Comirnaty(Gray Top)Covid-19 Tri-Sucrose Vaccine 07/25/2020, 12/30/2020   PFIZER(Purple Top)SARS-COV-2 Vaccination 08/12/2019, 09/02/2019   Tdap 12/17/2006, 07/23/2016      reports that she has never smoked. She has never used smokeless tobacco. She reports current alcohol use. She reports that she does not use drugs.  Past Medical History:  Diagnosis Date   Abnormal thyroid blood test    Mildly elevated TSH, resolved.   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 Herbert Moors to evaluate anemia; pathology showed a tubulovillous adenoma with no high grade dysplasia or malignancy identified.     Anemia    Anxiety    Acute stress reaction to multiple ICD shocks   Appendicitis, acute, s/p apendectomy 01/17/2013   Atrial fibrillation (HCC)    S/P radiofrequency ablation by Dr. Clydie Braun at [facility] on 02/09/2008 and again on 03/31/2009; S/P ablation at [facility] by Dr. Marchia Bond on 08/10/2010, followed by recurrent atrial flutter/fibrillation.  Cardiac cath done on 02/19/2011 at [facility] showed no significant coronary disease.  Patient underwent Convergent Procedure at [facility] on 03/07/2011.    Atrial tachycardia [facility])    Status post convergent ablation [facility] October 2012.  She underwent successful catheter ablation of persistent atrial tachycardia at [facility] by Dr. Herbert Deaner on 05/24/2014.     CHF (congestive heart failure) (HCC)    per patient "sees Dr. Ramond Marrow"   Cholecystitis, acute 07/2001   S/P laparoscopic cholecystectomy, intraoperative cholangiogram, and transcystic common bile duct exploration by Dr. Avel Peace on 07/14/2001   Dizziness    Family history of adverse reaction to anesthesia    per patient "sister had nausea and vomitting"   Fibroids    GERD (gastroesophageal reflux disease)    GI bleed 05/25/2011   Heart murmur    per patient "found at the age of 37"   Hyperglycemia    HYPERGLYCEMIA 12/24/2008   Qualifier: Diagnosis of  By: Meredith Pel MD, Dorene Sorrow     Hypertrophic obstructive cardiomyopathy (HOCM) (HCC)    TEE 09/02/2007 showed moderate to severe left ventricular hypertrophy with an interventricular septal dimension of 1.5 cm and posterior wall dimension of 7 cm; there was  normal LV systolic function and estimated EF of approximately 60%; there did not appear to be any obvious outflow tract obstruction.    Internal hemorrhoids 08/16/2022   Noted on Colonoscopy 07/05/22. Medium sized   Intra-abdominal abscess (HCC) 04/11/2019   Intraductal papilloma of left breast 05/26/2020   Found on Mammography, Plan for Lumpectomy with Dr Luisa Hart after cardiac clearance   Pelvic mass    Pelvic ultrasound done on 11/30/10 to evaluate abdominal pain showed,  positioned between the ovaries and contiguous with the ovaries, a complex mass demonstrating posterior acoustical  enhancement and no definite intralesional flow with color Doppler exam measuring 5.2 x 3.1 x 5.5 cm. The appearance raised the possibility of a ruptured ovarian cyst with subsequent development of a hematoma.    PONV (postoperative nausea and vomiting)    per patient about 11 years ago but hasn't happened since then   Presence of permanent cardiac pacemaker    per patient "pacemaker and defibrillator"   Seasonal allergic rhinitis    Sigmoid diverticulosis 08/16/2022   Noted on Colonoscopy 07/05/22, few small mouthed   Ventricular tachycardia (HCC)    Hx of nonsustained ventricular tachycardia, S/P hospitalization in March 2009; S/P ICD placement by Dr. [facility] Salvia on 09/25/2007.    Past Surgical History:  Procedure Laterality Date   APPENDECTOMY     BREAST LUMPECTOMY WITH RADIOACTIVE SEED LOCALIZATION Left 07/07/2020   Procedure: LEFT BREAST LUMPECTOMY WITH RADIOACTIVE SEED LOCALIZATION;  Surgeon: Harriette Bouillon, MD;  Location: [facility] OR;  Service: General;  Laterality: Left;   CARDIAC DEFIBRILLATOR PLACEMENT  09/25/2007; 08/2013   Placed by Dr. [facility] Salvia on 09/25/2007; gen change 08/2013 by Dr Graciela Husbands (STJ dual chamber ICD)   CHOLECYSTECTOMY     Convergent Ablation  03/07/2011   Patient underwent Convergent Procedure at [facility] on 03/07/2011.   FLEXIBLE SIGMOIDOSCOPY  05/26/2011   Procedure: FLEXIBLE SIGMOIDOSCOPY;  Surgeon: Shirley Friar, MD;  Location: [facility] ENDOSCOPY;  Service: Endoscopy;  Laterality: N/A;   IMPLANTABLE CARDIOVERTER DEFIBRILLATOR (ICD) GENERATOR CHANGE N/A 08/12/2013   Procedure: ICD GENERATOR CHANGE;  Surgeon: [facility] Salvia, MD;  Location: [facility] CATH LAB;  Service: Cardiovascular;  Laterality: N/A;   LAPAROSCOPIC APPENDECTOMY N/A 01/17/2013   Procedure: APPENDECTOMY LAPAROSCOPIC;  Surgeon: Emelia Loron, MD;  Location: [facility] OR;  Service: General;  Laterality: N/A;   LAPAROSCOPIC CHOLECYSTECTOMY  07/14/2001   S/P laparoscopic cholecystectomy, intraoperative  cholangiogram, and transcystic common bile duct exploration by Dr. Avel Peace on 07/14/2001   RADIOFREQUENCY ABLATION  08/10/2010   ablation  done at [facility]   RADIOFREQUENCY ABLATION  03/11/2009   ablation at [facility]   RADIOFREQUENCY ABLATION  01/10/2008   ablation at [facility]   RADIOFREQUENCY ABLATION  05/24/2014   Successful radiofrequency catheter ablation by Dr. Herbert Deaner at [facility].    Current Outpatient Medications  Medication Sig Dispense Refill   acetaminophen (TYLENOL) 500 MG tablet Take 500-1,000 mg by mouth every 6 (six) hours as needed for mild pain.     baclofen (LIORESAL) 10 MG tablet Take 0.5-1 pill up to three times a day as needed for headaches 90 each 5   dofetilide (TIKOSYN) 500 MCG capsule Take 500 mcg by mouth 2 (two) times daily.     fluticasone (FLONASE) 50 MCG/ACT nasal spray Place 2 sprays into both nostrils daily as needed for allergies or rhinitis.      JARDIANCE 10 MG TABS tablet Take 10 mg by mouth daily.     metoprolol succinate (TOPROL-XL) 50 MG 24  hr tablet Take 50 mg by mouth at bedtime.      potassium chloride (KLOR-CON) 10 MEQ tablet Take 10 mEq by mouth daily.     sertraline (ZOLOFT) 50 MG tablet Take 1 tablet (50 mg total) by mouth daily. 90 tablet 3   spironolactone (ALDACTONE) 25 MG tablet Take 25 mg by mouth daily.     torsemide (DEMADEX) 20 MG tablet Take 40 mg by mouth daily.     XARELTO 20 MG TABS tablet Take 20 mg by mouth daily.     zonisamide (ZONEGRAN) 25 MG capsule Take by mouth.     No current facility-administered medications for this visit.    ALLERGIES: Patient has no known allergies.  Family History  Problem Relation Age of Onset   Hypertrophic cardiomyopathy Sister    Heart disease Sister    Hypertension Sister    Throat cancer Father    Coronary artery disease Father 33       S/P CABG   Heart disease Father    Cancer Father        throat   Colon polyps Mother    Hypertrophic cardiomyopathy Mother    Heart disease Mother     Hyperlipidemia Mother    Cancer Maternal Aunt        breast   Heart disease Sister    Hypertrophic cardiomyopathy Cousin        3 cousins on mother's side Deceased   Breast cancer Paternal Aunt    Hypertrophic cardiomyopathy Maternal Grandmother    Hypertrophic cardiomyopathy Other        great uncle   Ovarian cancer Neg Hx        great aunt had   Colon cancer Neg Hx    Lung cancer Neg Hx    Diabetes Neg Hx     Review of Systems  All other systems reviewed and are negative.   PHYSICAL EXAM:  BP 126/80 (BP Location: Left Arm, Patient Position: Sitting, Cuff Size: Small)   Pulse 60   Ht 5' 8.5" (1.74 m)   Wt 163 lb (73.9 kg)   LMP 10/06/2011   SpO2 97%   BMI 24.42 kg/m     General appearance: alert, cooperative and appears stated age Head: normocephalic, without obvious abnormality, atraumatic Neck: no adenopathy, supple, symmetrical, trachea midline and thyroid normal to inspection and palpation Lungs: clear to auscultation bilaterally Breasts: right - normal appearance, no masses or tenderness, No nipple retraction or dimpling, No nipple discharge or bleeding, No axillary adenopathy Left - scar of superior breast, no masses or tenderness, No nipple retraction or dimpling, No nipple discharge or bleeding, No axillary adenopathy Heart: regular rate and rhythm Abdomen: soft, non-tender; no masses, no organomegaly Extremities: extremities normal, atraumatic, no cyanosis or edema Skin: skin color, texture, turgor normal. No rashes or lesions Lymph nodes: cervical, supraclavicular, and axillary nodes normal. Neurologic: grossly normal  Pelvic: External genitalia:  no lesions              No abnormal inguinal nodes palpated.              Urethra:  normal appearing urethra with no masses, tenderness or lesions              Bartholins and Skenes: normal                 Vagina: atrophy noted.               Cervix: no lesions  Pap taken: No. Bimanual Exam:  Uterus:   normal size, contour, position, consistency, mobility, non-tender              Adnexa: no mass, fullness, tenderness              Rectal exam: Yes.  .  Confirms.              Anus:  normal sphincter tone, no lesions  Chaperone was present for exam:  Warren Lacy, CMA  ASSESSMENT: Well woman visit with gynecologic exam Hx atrial fibrillation and ventricular tachycardia.  Has defibrillator.  On Xarelto. Status post left breast biopsy in 2022.   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 Vaginal atrophy.   PLAN: Mammogram screening discussed. Self breast awareness reviewed. Pap and HRV collected:  No. Guidelines for Calcium, Vitamin D, regular exercise program including cardiovascular and weight bearing exercise. Medication refills:  NA Treatments for atrophy discussed.  Follow up:  yearly and prn.

## 2023-06-19 ENCOUNTER — Ambulatory Visit: Payer: BC Managed Care – PPO | Admitting: Obstetrics and Gynecology

## 2023-07-02 ENCOUNTER — Ambulatory Visit (INDEPENDENT_AMBULATORY_CARE_PROVIDER_SITE_OTHER): Payer: 59 | Admitting: Obstetrics and Gynecology

## 2023-07-02 ENCOUNTER — Encounter: Payer: Self-pay | Admitting: Obstetrics and Gynecology

## 2023-07-02 VITALS — BP 126/80 | HR 60 | Ht 68.5 in | Wt 163.0 lb

## 2023-07-02 DIAGNOSIS — Z01419 Encounter for gynecological examination (general) (routine) without abnormal findings: Secondary | ICD-10-CM | POA: Diagnosis not present

## 2023-07-02 NOTE — Patient Instructions (Signed)

## 2023-07-25 ENCOUNTER — Encounter: Payer: Self-pay | Admitting: Internal Medicine

## 2023-07-25 ENCOUNTER — Ambulatory Visit: Payer: 59 | Admitting: Internal Medicine

## 2023-07-25 VITALS — BP 115/75 | HR 80 | Temp 97.6°F | Ht 65.0 in | Wt 163.5 lb

## 2023-07-25 DIAGNOSIS — Z0000 Encounter for general adult medical examination without abnormal findings: Secondary | ICD-10-CM

## 2023-07-25 DIAGNOSIS — I5042 Chronic combined systolic (congestive) and diastolic (congestive) heart failure: Secondary | ICD-10-CM

## 2023-07-25 DIAGNOSIS — G4733 Obstructive sleep apnea (adult) (pediatric): Secondary | ICD-10-CM | POA: Diagnosis not present

## 2023-07-25 DIAGNOSIS — I421 Obstructive hypertrophic cardiomyopathy: Secondary | ICD-10-CM | POA: Diagnosis not present

## 2023-07-25 DIAGNOSIS — I480 Paroxysmal atrial fibrillation: Secondary | ICD-10-CM | POA: Diagnosis not present

## 2023-07-25 DIAGNOSIS — Z7901 Long term (current) use of anticoagulants: Secondary | ICD-10-CM

## 2023-07-25 DIAGNOSIS — M62838 Other muscle spasm: Secondary | ICD-10-CM

## 2023-07-25 NOTE — Assessment & Plan Note (Addendum)
Appears euvolemic on exam following with [facility] cardiology.  Now taking Jardiance 10 mg daily she does not have diabetes.  Recheck lipid panel today.

## 2023-07-25 NOTE — Assessment & Plan Note (Signed)
Discussed vaccines.  Not interested in flu.  Has been mostly up-to-date with COVID boosters except for this year.  Discussed Shingrix vaccine.

## 2023-07-25 NOTE — Assessment & Plan Note (Signed)
Has been following with [facility] neurology and sleep studies.  Underwent a home sleep trial which showed a AHI of 31 as well as a possible Cheyne-Stokes breathing.  Subsequently underwent a CPAP titration study although had some difficulty falling asleep at the in-house lab they did not note any cheyne strokes issues.  Recommended CPAP trial setting of 9 although she has not started this yet she will likely follow-up and discuss with them further before starting CPAP therapy.

## 2023-07-25 NOTE — Assessment & Plan Note (Signed)
Appears to be in normal sinus rhythm today.  Remains on Tikosyn and metoprolol.  As well as Xarelto for anticoagulation.

## 2023-07-25 NOTE — Assessment & Plan Note (Signed)
Seeing Dr. Neale Burly for neck spasm/headaches/migraines considering injections.

## 2023-07-25 NOTE — Progress Notes (Signed)
Established Patient Office Visit  Subjective   Patient ID: [name], female    DOB: 04/21/1966  Age: 58 y.o. MRN: [id number]  Chief Complaint  Patient presents with   Medical Management of Chronic Issues   [name] is here for follow-up of her CHF, A-fib, OSA and for a general abnormal follow-up.  She continues to follow with [facility] cardiology and neurology for the majority of her care.  Since her last visit she has been referred to Dr. Neale Burly and is considering getting injections to the back of her neck for headaches/pain.  She is not quite sure what all was in the injection other than lidocaine.  She is recently been diagnosed with obstructive sleep apnea has not gotten onto CPAP therapy yet.  She did undergo colonoscopy which found sigmoid diverticulosis and internal hemorrhoids.    Objective:     BP 115/75 (BP Location: Left Arm, Patient Position: Sitting, Cuff Size: Normal)   Pulse 80   Temp 97.6 F (36.4 C) (Oral)   Ht 5\' 5"  (1.651 m)   Wt 163 lb 8 oz (74.2 kg)   LMP 10/06/2011   SpO2 100% Comment: RA  BMI 27.21 kg/m  BP Readings from Last 3 Encounters:  07/25/23 115/75  07/02/23 126/80  03/11/23 108/74   Wt Readings from Last 3 Encounters:  07/25/23 163 lb 8 oz (74.2 kg)  07/02/23 163 lb (73.9 kg)  03/11/23 161 lb 3.2 oz (73.1 kg)      Physical Exam Vitals and nursing note reviewed.  Constitutional:      Appearance: Normal appearance. She is not ill-appearing.  Cardiovascular:     Rate and Rhythm: Normal rate and regular rhythm.  Pulmonary:     Effort: Pulmonary effort is normal.     Breath sounds: Normal breath sounds. No wheezing.  Neurological:     Mental Status: She is alert.  Psychiatric:        Mood and Affect: Mood normal.      No results found for any visits on 07/25/23.  Last CBC Lab Results  Component Value Date   WBC 7.0 07/05/2020   HGB 14.3 07/05/2020   HCT 43.6 07/05/2020   MCV 87.9 07/05/2020   MCH 28.8 07/05/2020   RDW  13.2 07/05/2020   PLT 271 07/05/2020   Last metabolic panel Lab Results  Component Value Date   GLUCOSE 88 07/05/2020   NA 138 07/05/2020   K 3.8 07/05/2020   CL 97 (L) 07/05/2020   CO2 29 07/05/2020   BUN 31 (H) 07/05/2020   CREATININE 1.09 (H) 07/05/2020   GFRNONAA 60 (L) 07/05/2020   CALCIUM 10.2 07/05/2020   PROT 7.8 07/05/2020   ALBUMIN 4.6 07/05/2020   BILITOT 1.5 (H) 07/05/2020   ALKPHOS 76 07/05/2020   AST 37 07/05/2020   ALT 24 07/05/2020   ANIONGAP 12 07/05/2020   Last lipids Lab Results  Component Value Date   CHOL 206 (H) 03/14/2022   HDL 39 (L) 03/14/2022   LDLCALC 140 (H) 03/14/2022   TRIG 138 03/14/2022   CHOLHDL 5.3 (H) 03/14/2022       The 10-year ASCVD risk score (Arnett DK, et al., 2019) is: 3.2%    Assessment & Plan:   Problem List Items Addressed This Visit       Cardiovascular and Mediastinum   Hypertrophic obstructive cardiomyopathy (HCC) (Chronic)   Relevant Orders   Lipid Profile   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  Now taking Jardiance 10 mg daily she does not have diabetes.  Recheck lipid panel today.      Relevant Orders   Lipid Profile   Atrial fibrillation (HCC) (Chronic)   Appears to be in normal sinus rhythm today.  Remains on Tikosyn and metoprolol.  As well as Xarelto for anticoagulation.        Respiratory   Obstructive sleep apnea - Primary   Has been following with [facility] neurology and sleep studies.  Underwent a home sleep trial which showed a AHI of 31 as well as a possible Cheyne-Stokes breathing.  Subsequently underwent a CPAP titration study although had some difficulty falling asleep at the in-house lab they did not note any cheyne strokes issues.  Recommended CPAP trial setting of 9 although she has not started this yet she will likely follow-up and discuss with them further before starting CPAP therapy.         Musculoskeletal and Integument   Neck muscle spasm   Seeing Dr. Neale Burly for neck spasm/headaches/migraines considering injections.        Other   Healthcare maintenance (Chronic)   Discussed vaccines.  Not interested in flu.  Has been mostly up-to-date with COVID boosters except for this year.  Discussed Shingrix vaccine.      Long term current use of anticoagulant therapy    Return in about 1 year (around 07/24/2024).    Gust Rung, DO

## 2023-07-26 LAB — LIPID PANEL
Chol/HDL Ratio: 4.4 {ratio} (ref 0.0–4.4)
Cholesterol, Total: 203 mg/dL — ABNORMAL HIGH (ref 100–199)
HDL: 46 mg/dL (ref >39)
LDL Chol Calc (NIH): 136 mg/dL — ABNORMAL HIGH (ref 0–99)
Triglycerides: 119 mg/dL (ref 0–149)
VLDL Cholesterol Cal: 21 mg/dL (ref 5–40)

## 2023-08-06 LAB — HM MAMMOGRAPHY

## 2023-08-07 ENCOUNTER — Encounter: Payer: Self-pay | Admitting: Internal Medicine

## 2023-09-25 ENCOUNTER — Other Ambulatory Visit: Payer: Self-pay | Admitting: Adult Health

## 2023-09-25 DIAGNOSIS — F411 Generalized anxiety disorder: Secondary | ICD-10-CM

## 2023-10-27 ENCOUNTER — Other Ambulatory Visit: Payer: Self-pay | Admitting: Adult Health

## 2023-10-27 DIAGNOSIS — F411 Generalized anxiety disorder: Secondary | ICD-10-CM

## 2023-11-26 ENCOUNTER — Other Ambulatory Visit: Payer: Self-pay | Admitting: Adult Health

## 2023-11-26 DIAGNOSIS — F411 Generalized anxiety disorder: Secondary | ICD-10-CM

## 2024-02-17 ENCOUNTER — Ambulatory Visit: Payer: Self-pay

## 2024-02-17 NOTE — Telephone Encounter (Signed)
 FYI Only or Action Required?: FYI only for provider.  Patient was last seen in primary care on 07/25/2023 by Rosan Dayton BROCKS, DO.  Called Nurse Triage reporting Hand Pain.  Symptoms began several months ago.  Interventions attempted: Rest, hydration, or home remedies.  Symptoms are: gradually worsening.  Triage Disposition: See PCP Within 2 Weeks  Patient/caregiver understands and will follow disposition?: Yes, but will wait   Copied from CRM [id number]. Topic: Clinical - Red Word Triage >> Feb 17, 2024  3:01 PM Carrielelia G wrote: Red Word that prompted transfer to Nurse Triage: left hand, dropping things, pain, getting progressively worse. Reason for Disposition  Hand or wrist pain is a chronic symptom (recurrent or ongoing AND present > 4 weeks)  Additional Information  Negative: [1] Weakness or numbness in hand or fingers AND [2] present > 2 weeks    Intermittent when hand is painful  Answer Assessment - Initial Assessment Questions Additional info: Patient scheduled with pcp next available October 6, this writer offered sooner appointment with alternate provider but patient declines offer and requested to schedule on pcp next available date. Symptoms x 1 year, worsening for a few weeks.    1. ONSET: When did the pain start?     One year-progressively getting worse 2. LOCATION: Where is the pain located?     Left hand near thumb joint  3. PAIN: How bad is the pain? (Scale 1-10; or mild, moderate, severe)   - MILD (1-3): doesn't interfere with normal activities   - MODERATE (4-7): interferes with normal activities (e.g., work or school) or awakens from sleep   - SEVERE (8-10): excruciating pain, unable to use hand at all     Moderate when using hand 4. WORK OR EXERCISE: Has there been any recent work or exercise that involved this part (i.e., hand or wrist) of the body?     Denies-retired now but used to type a lot  5. CAUSE: What do you think is causing the  pain?     unsure 6. AGGRAVATING FACTORS: What makes the pain worse? (e.g., using computer)     Manipulating hand, polishing items etc.  7. OTHER SYMPTOMS: Do you have any other symptoms? (e.g., neck pain, swelling, rash, numbness, fever)     Left hand decreased grip strength. Denies tingling, numbness, signs of infection.  Protocols used: Hand and Wrist Pain-A-AH

## 2024-03-16 ENCOUNTER — Encounter: Payer: Self-pay | Admitting: Internal Medicine

## 2024-03-16 ENCOUNTER — Ambulatory Visit: Payer: Self-pay | Admitting: Internal Medicine

## 2024-03-16 VITALS — BP 106/65 | HR 60 | Temp 97.8°F | Ht 65.0 in | Wt 167.4 lb

## 2024-03-16 DIAGNOSIS — G8929 Other chronic pain: Secondary | ICD-10-CM | POA: Diagnosis not present

## 2024-03-16 DIAGNOSIS — M79645 Pain in left finger(s): Secondary | ICD-10-CM | POA: Diagnosis not present

## 2024-03-16 NOTE — Patient Instructions (Signed)
 I have put in a referral to Hand Orthopedics.

## 2024-03-17 NOTE — Progress Notes (Signed)
  Subjective:  HPI: Chief Complaint  Patient presents with   Left hand    Pain, mainly her thumb, with gripping.    Discussed the use of AI scribe software for clinical note transcription with the patient, who gave verbal consent to proceed.  History of Present Illness [name] is a 58 year old female who presents with left thumb pain.  She has been experiencing pain in her left thumb for about a year. The pain is described as shooting through the thumb, particularly when gripping objects, which causes her to drop them. The dropping is due to the pain causing her hand to reflexively open, not due to numbness or tingling.  She has tried various treatments including sports creams, thumb braces, ice, and heat, but none have provided relief. She has used two different types of thumb braces: one that immobilizes the thumb and another that maintains a certain space between the thumb and fingers.  The pain is primarily located at the base of the thumb, and pressing on this area can be very painful. No swelling has been observed in the area.  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 She is right-handed, and the pain is in her non-dominant hand.  She has been trying to avoid overuse of her left hand to manage the pain.     Please see Assessment and Plan below for the status of her chronic medical problems.  Objective:  Physical Exam: Vitals:   03/16/24 1003  BP: 106/65  Pulse: 60  Temp: 97.8 F (36.6 C)  TempSrc: Oral  SpO2: 100%  Weight: 167 lb 6.4 oz (75.9 kg)  Height: 5' 5 (1.651 m)   Body mass index is 27.86 kg/m. Physical Exam Constitutional:      Appearance: Normal appearance.  Pulmonary:     Effort: Pulmonary effort is normal.  Musculoskeletal:     Comments: No erythema or tenderness of wrist, digits 2-5 MCP, PIP or DIP joints. Has mild tenderness at 1st metacarpal and carpal joint Grip strength  intact  Neurological:     Mental Status: She is alert.  Psychiatric:        Mood and Affect: Mood normal.    Results   No results found for any visits on 03/16/24.  The 10-year ASCVD risk score (Arnett DK, et al., 2019) is: 2.6%  Assessment & Plan:  See Encounters Tab for problem based charting. Assessment & Plan Chronic pain of left thumb She has tried appropriate rest and splinting of the join and still have some persistent symptoms with occasional shocking pain that could cause her to drop something (although no true weakness)  Will refer over to hand ortho to evaluate. Orders:   Ambulatory referral to Orthopedic Surgery    Medications Ordered No orders of the defined types were placed in this encounter.  Other Orders Orders Placed This Encounter  Procedures   Ambulatory referral to Orthopedic Surgery    Referral Priority:   Routine    Referral Type:   Surgical    Referral Reason:   Specialty Services Required    Requested Specialty:   Orthopedic Surgery    Number of Visits Requested:   1   Follow Up: No follow-ups on file.

## 2024-03-19 ENCOUNTER — Ambulatory Visit: Admitting: Orthopedic Surgery

## 2024-03-19 ENCOUNTER — Other Ambulatory Visit: Payer: Self-pay

## 2024-03-19 DIAGNOSIS — M25532 Pain in left wrist: Secondary | ICD-10-CM

## 2024-03-19 DIAGNOSIS — M654 Radial styloid tenosynovitis [de Quervain]: Secondary | ICD-10-CM | POA: Diagnosis not present

## 2024-03-19 MED ORDER — BETAMETHASONE SOD PHOS & ACET 6 (3-3) MG/ML IJ SUSP
6.0000 mg | INTRAMUSCULAR | Status: AC | PRN
  Administered 2024-03-19: 6 mg via INTRA_ARTICULAR

## 2024-03-19 MED ORDER — LIDOCAINE HCL 1 % IJ SOLN
1.0000 mL | INTRAMUSCULAR | Status: AC | PRN
  Administered 2024-03-19: 1 mL

## 2024-03-19 NOTE — Progress Notes (Signed)
 [name] - 58 y.o. female MRN [id number]  Date of birth: [birth year]  Office Visit Note: Visit Date: 03/19/2024 PCP: Rosan Dayton BROCKS, DO Referred by: Rosan Dayton BROCKS, DO  Subjective: No chief complaint on file.  HPI: [name] is a pleasant 58 y.o. female who presents today for left thumb/wrist pain. She states the pain has been ongoing for a little over a year, but worsening more recently.  Has not undergone any formalized workup or treatment.  She denies any numbness or tingling.  Is overall healthy and active at baseline.  Pertinent ROS were reviewed with the patient and found to be negative unless otherwise specified above in HPI.   Visit Reason: left thumb/wrist Duration of symptoms: 1+ year Hand dominance: right Occupation: retired Diabetic: No Smoking: No Heart/Lung History: yes Blood Thinners:  xarelto  Prior Testing/EMG: none Injections (Date): none Treatments: none Prior Surgery: none    Assessment & Plan: Visit Diagnoses:  1. De Quervain's tenosynovitis, left   2. Pain in left wrist     Plan: Extensive discussion was had with the patient today regarding her left wrist radial sided pain.  Patient has signs and symptoms consistent with de Quervain's tenosynovitis.  We discussed the underlying etiology and pathophysiology of this condition as well as treatment modalities ranging from conservative to surgical.  From a conservative standpoint, we discussed activity modification, anti-inflammatory medication both topical and oral, bracing, therapy and cortisone injections.  From a surgical standpoint, I explained to patient that we can perform right wrist first extensor compartment release should symptoms remain affected conservative care.  Understanding her options, given that she has not undergone any formalized conservative care, she would like to begin with bracing and cortisone injection today to the first tensor compartment for symptom relief.  Risks and  benefits of the cortisone injection were discussed in detail, patient elects to proceed.  Injection was provided today without issue she can follow-up in approximate 6 weeks for recheck.   Follow-up: No follow-ups on file.   Meds & Orders: No orders of the defined types were placed in this encounter.   Orders Placed This Encounter  Procedures   Hand/UE Inj   XR Wrist Complete Left     Procedures: Hand/UE Inj: L extensor compartment 1 for de Quervain's tenosynovitis on 03/19/2024 7:20 PM Indications: pain Details: 22 G needle Medications: 1 mL lidocaine  1 %; 6 mg betamethasone acetate-betamethasone sodium phosphate  6 (3-3) MG/ML Outcome: tolerated well, no immediate complications Procedure, treatment alternatives, risks and benefits explained, specific risks discussed. Consent was given by the patient.          Clinical History: No specialty comments available.  She reports that she has never smoked. She has never used smokeless tobacco. No results for input(s): HGBA1C, LABURIC in the last 8760 hours.  Objective:   Vital Signs: LMP 10/06/2011   Physical Exam  Gen: Well-appearing, in no acute distress; non-toxic CV: Regular Rate. Well-perfused. Warm.  Resp: Breathing unlabored on room air; no wheezing. Psych: Fluid speech in conversation; appropriate affect; normal thought process  Ortho Exam General: Patient is well appearing and in no distress.  Skin and Muscle: No skin changes are apparent to upper extremities.  Muscle bulk and contour normal, no signs of atrophy.     Range of Motion and Palpation Tests: Mobility is full about the elbows with flexion and extension.  Forearm supination and pronation are 85/85 bilaterally.  Wrist flexion/extension is 75/65 right side, wrist flexion/extension limited  secondary to pain left side, approximately 65/55.  Digital flexion and extension are full.    No cords or nodules are palpated.  No triggering is observed.   Finklestein  test is positive left side, significant pain with associated swelling and tenderness.    Neurologic, Vascular, Motor: Sensation is intact to light touch in the median/radial/ulnar distributions.   Fingers pink and well perfused.  Capillary refill is brisk.      Imaging: No results found.  Past Medical/Family/Surgical/Social History: Medications & Allergies reviewed per EMR, new medications updated. 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 Lesta Fitz to evaluate anemia; pathology showed a tubulovillous adenoma with no high grade dysplasia or malignancy identified.     Anemia    Anxiety    Acute stress reaction to multiple ICD shocks   Appendicitis, acute, s/p apendectomy 01/17/2013   Atrial fibrillation (HCC)    S/P radiofrequency ablation by Dr. Alm Needle at [facility] on 02/09/2008 and again on 03/31/2009; S/P ablation at [facility] by Dr. Clent Members on 08/10/2010, followed by recurrent atrial flutter/fibrillation.  Cardiac cath done on 02/19/2011 at [facility] showed no significant coronary disease.   Patient underwent Convergent Procedure at [facility] on 03/07/2011.    Atrial tachycardia    Status post convergent ablation [facility] October 2012.  She underwent successful catheter ablation of persistent atrial tachycardia at [facility] by Dr. Lorean on 05/24/2014.     CHF (congestive heart failure) (HCC)    per patient sees Dr. Redell Primrose   Cholecystitis, acute 07/2001   S/P laparoscopic cholecystectomy, intraoperative cholangiogram, and transcystic common bile duct exploration by Dr. Krystal Russell on 07/14/2001   Dizziness    Family history of adverse reaction to anesthesia    per patient sister had nausea and vomitting   Fibroids    GERD (gastroesophageal reflux disease)    GI bleed 05/25/2011   Heart murmur    per patient found at the age of 24   Hyperglycemia    HYPERGLYCEMIA 12/24/2008   Qualifier: Diagnosis of  By: Lanis MD, Christopher     Hypertrophic obstructive cardiomyopathy (HOCM) (HCC)    TEE 09/02/2007 showed moderate to severe left ventricular hypertrophy with an interventricular septal dimension of 1.5 cm and posterior wall dimension of 7 cm; there was normal LV systolic function and estimated EF of approximately 60%; there did not appear to be any obvious outflow tract obstruction.    Internal hemorrhoids 08/16/2022   Noted on Colonoscopy 07/05/22. Medium sized   Intra-abdominal abscess (HCC) 04/11/2019   Intraductal papilloma of left breast 05/26/2020   Found on Mammography, Plan for Lumpectomy with Dr Vanderbilt after cardiac clearance   Pelvic mass  Pelvic ultrasound done on 11/30/10 to evaluate abdominal pain showed,  positioned between the ovaries and contiguous with the ovaries, a complex mass demonstrating posterior acoustical enhancement and no definite intralesional flow with color Doppler exam measuring 5.2 x 3.1 x 5.5 cm. The appearance raised the possibility of a ruptured ovarian cyst with subsequent development of a hematoma.    PONV (postoperative nausea and vomiting)    per  patient about 11 years ago but hasn't happened since then   Presence of permanent cardiac pacemaker    per patient pacemaker and defibrillator   Seasonal allergic rhinitis    Sigmoid diverticulosis 08/16/2022   Noted on Colonoscopy 07/05/22, few small mouthed   Ventricular tachycardia (HCC)    Hx of nonsustained ventricular tachycardia, S/P hospitalization in March 2009; S/P ICD placement by Dr. Elspeth KYM Sage on 09/25/2007.   Family History  Problem Relation Age of Onset   Hypertrophic cardiomyopathy Sister    Heart disease Sister    Hypertension Sister    Throat cancer Father    Coronary artery disease Father 61       S/P CABG   Heart disease Father    Cancer Father        throat   Colon polyps Mother    Hypertrophic cardiomyopathy Mother    Heart disease Mother    Hyperlipidemia Mother    Cancer Maternal Aunt        breast   Heart disease Sister    Hypertrophic cardiomyopathy Cousin        3 cousins on mother's side Deceased   Breast cancer Paternal Aunt    Hypertrophic cardiomyopathy Maternal Grandmother    Hypertrophic cardiomyopathy Other        great uncle   Ovarian cancer Neg Hx        great aunt had   Colon cancer Neg Hx    Lung cancer Neg Hx    Diabetes Neg Hx    Past Surgical History:  Procedure Laterality Date   APPENDECTOMY     BREAST LUMPECTOMY WITH RADIOACTIVE SEED LOCALIZATION Left 07/07/2020   Procedure: LEFT BREAST LUMPECTOMY WITH RADIOACTIVE SEED LOCALIZATION;  Surgeon: Vanderbilt Ned, MD;  Location: [facility] OR;  Service: General;  Laterality: Left;   CARDIAC DEFIBRILLATOR PLACEMENT  09/25/2007; 08/2013   Placed by Dr. Elspeth KYM Sage on 09/25/2007; gen change 08/2013 by Dr Sage (STJ dual chamber ICD)   CHOLECYSTECTOMY     Convergent Ablation  03/07/2011   Patient underwent Convergent Procedure at [facility] on 03/07/2011.   FLEXIBLE SIGMOIDOSCOPY  05/26/2011   Procedure: FLEXIBLE SIGMOIDOSCOPY;  Surgeon: Jerrell KYM Sol, MD;  Location: [facility] ENDOSCOPY;   Service: Endoscopy;  Laterality: N/A;   IMPLANTABLE CARDIOVERTER DEFIBRILLATOR (ICD) GENERATOR CHANGE N/A 08/12/2013   Procedure: ICD GENERATOR CHANGE;  Surgeon: Elspeth JAYSON Sage, MD;  Location: [facility] CATH LAB;  Service: Cardiovascular;  Laterality: N/A;   LAPAROSCOPIC APPENDECTOMY N/A 01/17/2013   Procedure: APPENDECTOMY LAPAROSCOPIC;  Surgeon: Donnice Bury, MD;  Location: [facility] OR;  Service: General;  Laterality: N/A;   LAPAROSCOPIC CHOLECYSTECTOMY  07/14/2001   S/P laparoscopic cholecystectomy, intraoperative cholangiogram, and transcystic common bile duct exploration by Dr. Krystal Russell on 07/14/2001   RADIOFREQUENCY ABLATION  08/10/2010   ablation  done at [facility]   RADIOFREQUENCY ABLATION  03/11/2009   ablation at [facility]   RADIOFREQUENCY ABLATION  01/10/2008   ablation at [facility]   RADIOFREQUENCY ABLATION  05/24/2014   Successful radiofrequency catheter ablation by Dr. Lorean at [facility].   Social  History   Occupational History   Occupation: Contractor: [employer]  Tobacco Use   Smoking status: Never   Smokeless tobacco: Never  Vaping Use   Vaping status: Never Used  Substance and Sexual Activity   Alcohol use: Yes    Comment: Occasionally.   Drug use: No   Sexual activity: Yes    Partners: Female    Birth control/protection: Post-menopausal    Byrne Capek Estela) Arlinda, M.D. [facility], Hand Surgery

## 2024-04-13 ENCOUNTER — Encounter: Payer: Self-pay | Admitting: Radiology

## 2024-04-29 NOTE — Progress Notes (Unsigned)
 [name] - 58 y.o. female MRN [id number]  Date of birth: 01-Jul-1965  Office Visit Note: Visit Date: 04/30/2024 PCP: Mary Dayton BROCKS, DO Referred by: Mary Dayton BROCKS, DO  Subjective: No chief complaint on file.  HPI: [name] is a pleasant 58 y.o. female who returns today for follow-up of left wrist de Quervain's tenosynovitis.  She underwent injection to the left wrist first extensor compartment approximate 6 weeks prior.  States the injection did give her significant pain relief, has noted a slight recurrence of symptoms over the past couple weeks.  Has utilized her Comfort Cool brace as needed.  Pertinent ROS were reviewed with the patient and found to be negative unless otherwise specified above in HPI.     Assessment & Plan: Visit Diagnoses:  1. De Quervain's tenosynovitis, left      Plan: I am glad that the injection is giving her significant relief.  We did discuss the underlying etiology and pathophysiology of de Quervain's tenosynovitis once again today.  I did explain to her that symptoms could recur in the future which may warrant repeat injection versus potential first extensor compartment release.  Risks and benefits of the procedure were discussed in detail as well as the appropriate postoperative protocol as well.  She will return in approximately 6 weeks for repeat examination and consideration of appropriate next steps.   Follow-up: No follow-ups on file.   Meds & Orders: No orders of the defined types were placed in this encounter.   No orders of the defined types were placed in this encounter.    Procedures: No procedures performed      Clinical History: No specialty comments available.  She reports that she has never smoked. She has never used smokeless tobacco. No results for input(s): HGBA1C, LABURIC in the last 8760 hours.  Objective:   Vital Signs: LMP 10/06/2011   Physical Exam  Gen: Well-appearing, in no acute distress;  non-toxic CV: Regular Rate. Well-perfused. Warm.  Resp: Breathing unlabored on room air; no wheezing. Psych: Fluid speech in conversation; appropriate affect; normal thought process  Ortho Exam General: Patient is well appearing and in no distress.  Skin and Muscle: No skin changes are apparent to upper extremities.  Muscle bulk and contour normal, no signs of atrophy.     Range of Motion and Palpation Tests: Mobility is full about the elbows with flexion and extension.  Forearm supination and pronation are 85/85 bilaterally.  Wrist flexion/extension is 75/65 right side, wrist flexion/extension limited secondary to pain left side, approximately 65/55.  Digital flexion and extension are full.    No cords or nodules are palpated.  No triggering is observed.   Finklestein test is minimal left side, no significant pain with minimal swelling and tenderness.    Neurologic, Vascular, Motor: Sensation is intact to light touch in the median/radial/ulnar distributions.   Fingers pink and well perfused.  Capillary refill is brisk.      Imaging: No results found.  Past Medical/Family/Surgical/Social History: Medications & Allergies reviewed per EMR, new medications updated. Patient Active Problem List   Diagnosis Date Noted   Obstructive sleep apnea 07/25/2023   Internal hemorrhoids 08/16/2022   Sigmoid diverticulosis 08/16/2022   Neck muscle spasm 06/29/2022   Healthcare maintenance 03/13/2018   Hyperglycemia 08/26/2013   Chronic combined systolic and diastolic congestive heart failure (HCC) 05/20/2012   Long term current use of anticoagulant therapy 06/27/2010   Anxiety 03/01/2010   Hypertrophic obstructive cardiomyopathy (HCC) 09/03/2008  VENTRICULAR TACHYCARDIA 09/03/2008   Atrial tachycardia 09/03/2008   Atrial fibrillation (HCC) 09/30/2007   ALLERGIC RHINITIS, SEASONAL 09/30/2007   Automatic implantable cardioverter-defibrillator in situ 09/25/2007   Past Medical History:   Diagnosis Date   Abnormal thyroid blood test    Mildly elevated TSH, resolved.   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 Lesta Fitz to evaluate anemia; pathology showed a tubulovillous adenoma with no high grade dysplasia or malignancy identified.     Anemia    Anxiety    Acute stress reaction to multiple ICD shocks   Appendicitis, acute, s/p apendectomy 01/17/2013   Atrial fibrillation (HCC)    S/P radiofrequency ablation by Dr. Alm Needle at [facility] on 02/09/2008 and again on 03/31/2009; S/P ablation at [facility] by Dr. Clent Members on 08/10/2010, followed by recurrent atrial flutter/fibrillation.  Cardiac cath done on 02/19/2011 at [facility] showed no significant coronary disease.  Patient underwent Convergent Procedure at [facility] on 03/07/2011.    Atrial tachycardia    Status post convergent ablation [facility] October 2012.  She underwent successful catheter ablation of persistent atrial tachycardia at [facility] by Dr. Lorean on 05/24/2014.     CHF (congestive heart failure) (HCC)    per patient sees Dr. Redell Primrose   Cholecystitis, acute 07/2001   S/P laparoscopic cholecystectomy, intraoperative cholangiogram, and transcystic common bile duct exploration by Dr. Krystal Russell on 07/14/2001   Dizziness    Family history of adverse reaction to anesthesia    per patient sister had nausea and vomitting   Fibroids    GERD (gastroesophageal reflux disease)    GI bleed 05/25/2011   Heart murmur    per patient found at the age of 16   Hyperglycemia    HYPERGLYCEMIA 12/24/2008   Qualifier: Diagnosis of  By: Lanis MD, Christopher     Hypertrophic obstructive cardiomyopathy (HOCM) (HCC)    TEE 09/02/2007 showed moderate to severe left ventricular hypertrophy with an interventricular septal dimension of 1.5 cm and posterior wall dimension of 7 cm; there was normal LV systolic function and estimated EF of approximately  60%; there did not appear to be any obvious outflow tract obstruction.    Internal hemorrhoids 08/16/2022   Noted on Colonoscopy 07/05/22. Medium sized   Intra-abdominal abscess (HCC) 04/11/2019   Intraductal papilloma of left breast 05/26/2020   Found on Mammography, Plan for Lumpectomy with Dr Vanderbilt after cardiac clearance   Pelvic mass    Pelvic ultrasound done on 11/30/10 to evaluate abdominal pain showed,  positioned between the ovaries and contiguous with the ovaries, a complex mass demonstrating posterior acoustical enhancement and no definite intralesional flow with color Doppler exam measuring 5.2 x 3.1 x 5.5 cm. The appearance raised the possibility of a ruptured ovarian cyst with subsequent development of a hematoma.    PONV (postoperative nausea and vomiting)    per patient about 11 years ago but hasn't happened since then   Presence of permanent cardiac pacemaker    per patient pacemaker and defibrillator   Seasonal allergic rhinitis    Sigmoid diverticulosis 08/16/2022   Noted on Colonoscopy 07/05/22, few small mouthed   Ventricular tachycardia (HCC)    Hx of nonsustained ventricular tachycardia, S/P hospitalization in March 2009; S/P ICD placement by Dr. Elspeth KYM Sage on 09/25/2007.   Family History  Problem Relation Age of Onset   Hypertrophic cardiomyopathy Sister    Heart disease Sister  Hypertension Sister    Throat cancer Father    Coronary artery disease Father 36       S/P CABG   Heart disease Father    Cancer Father        throat   Colon polyps Mother    Hypertrophic cardiomyopathy Mother    Heart disease Mother    Hyperlipidemia Mother    Cancer Maternal Aunt        breast   Heart disease Sister    Hypertrophic cardiomyopathy Cousin        3 cousins on mother's side Deceased   Breast cancer Paternal Aunt    Hypertrophic cardiomyopathy Maternal Grandmother    Hypertrophic cardiomyopathy Other        great uncle   Ovarian cancer Neg Hx        great  aunt had   Colon cancer Neg Hx    Lung cancer Neg Hx    Diabetes Neg Hx    Past Surgical History:  Procedure Laterality Date   APPENDECTOMY     BREAST LUMPECTOMY WITH RADIOACTIVE SEED LOCALIZATION Left 07/07/2020   Procedure: LEFT BREAST LUMPECTOMY WITH RADIOACTIVE SEED LOCALIZATION;  Surgeon: Vanderbilt Ned, MD;  Location: [facility] OR;  Service: General;  Laterality: Left;   CARDIAC DEFIBRILLATOR PLACEMENT  09/25/2007; 08/2013   Placed by Dr. Elspeth KYM Sage on 09/25/2007; gen change 08/2013 by Dr Sage (STJ dual chamber ICD)   CHOLECYSTECTOMY     Convergent Ablation  03/07/2011   Patient underwent Convergent Procedure at [facility] on 03/07/2011.   FLEXIBLE SIGMOIDOSCOPY  05/26/2011   Procedure: FLEXIBLE SIGMOIDOSCOPY;  Surgeon: Jerrell KYM Sol, MD;  Location: [facility] ENDOSCOPY;  Service: Endoscopy;  Laterality: N/A;   IMPLANTABLE CARDIOVERTER DEFIBRILLATOR (ICD) GENERATOR CHANGE N/A 08/12/2013   Procedure: ICD GENERATOR CHANGE;  Surgeon: Elspeth JAYSON Sage, MD;  Location: [facility] CATH LAB;  Service: Cardiovascular;  Laterality: N/A;   LAPAROSCOPIC APPENDECTOMY N/A 01/17/2013   Procedure: APPENDECTOMY LAPAROSCOPIC;  Surgeon: Donnice Bury, MD;  Location: [facility] OR;  Service: General;  Laterality: N/A;   LAPAROSCOPIC CHOLECYSTECTOMY  07/14/2001   S/P laparoscopic cholecystectomy, intraoperative cholangiogram, and transcystic common bile duct exploration by Dr. Krystal Russell on 07/14/2001   RADIOFREQUENCY ABLATION  08/10/2010   ablation  done at [facility]   RADIOFREQUENCY ABLATION  03/11/2009   ablation at [facility]   RADIOFREQUENCY ABLATION  01/10/2008   ablation at [facility]   RADIOFREQUENCY ABLATION  05/24/2014   Successful radiofrequency catheter ablation by Dr. Lorean at [facility].   Social History   Occupational History   Occupation: Contractor: [employer]  Tobacco Use   Smoking status: Never   Smokeless tobacco: Never  Vaping Use   Vaping status: Never Used  Substance and Sexual Activity    Alcohol use: Yes    Comment: Occasionally.   Drug use: No   Sexual activity: Yes    Partners: Female    Birth control/protection: Post-menopausal    Hamid Brookens Estela) Arlinda, M.D. [facility], Hand Surgery

## 2024-04-30 ENCOUNTER — Ambulatory Visit: Admitting: Orthopedic Surgery

## 2024-04-30 DIAGNOSIS — M654 Radial styloid tenosynovitis [de Quervain]: Secondary | ICD-10-CM

## 2024-06-15 ENCOUNTER — Ambulatory Visit: Admitting: Orthopedic Surgery

## 2024-06-15 DIAGNOSIS — M654 Radial styloid tenosynovitis [de Quervain]: Secondary | ICD-10-CM

## 2024-06-15 DIAGNOSIS — M1812 Unilateral primary osteoarthritis of first carpometacarpal joint, left hand: Secondary | ICD-10-CM

## 2024-06-15 MED ORDER — LIDOCAINE HCL 1 % IJ SOLN
1.0000 mL | INTRAMUSCULAR | Status: AC | PRN
  Administered 2024-06-15: 1 mL

## 2024-06-15 MED ORDER — BETAMETHASONE SOD PHOS & ACET 6 (3-3) MG/ML IJ SUSP
6.0000 mg | INTRAMUSCULAR | Status: AC | PRN
  Administered 2024-06-15: 6 mg via INTRA_ARTICULAR

## 2024-06-15 NOTE — Progress Notes (Signed)
 "  [name] - 59 y.o. female MRN [id number]  Date of birth: 01-12-1966  Office Visit Note: Visit Date: 06/15/2024 PCP: Rosan Dayton BROCKS, DO Referred by: Rosan Dayton BROCKS, DO  Subjective: No chief complaint on file.  HPI: [name] is a pleasant 59 y.o. female who returns today for follow-up of left wrist de Quervain's tenosynovitis and associated left thumb CMC arthritis.  She underwent injection to the left wrist first extensor compartment approximate 12 weeks prior.  States the injection did give her significant pain relief, has noted more ongoing pain at the basilar aspect of the thumb, particular with heavy pinch and grip activities.  Does have a Comfort Cool brace that she has been utilizing  Pertinent ROS were reviewed with the patient and found to be negative unless otherwise specified above in HPI.     Assessment & Plan: Visit Diagnoses:  1. Arthritis of carpometacarpal (CMC) joint of left thumb   2. De Quervain's tenosynovitis, left       Plan: I am glad that the injection to the first extensor compartment is giving her significant relief.  However we did discuss the ongoing thumb CMC arthritis.    Extensive discussion was had with the patient today regarding her left thumb basilar joint pain.  X-rays from prior confirm diagnosis of ongoing left thumb CMC arthritis which correlates with clinical examination.  We reviewed the etiology and pathophysiology of this condition as well as appropriate treatment modalities ranging from conservative to surgical.  From a conservative standpoint, we discussed bracing, activity modification, nonsteroidal anti-inflammatory medications both oral and topical, and finally cortisone injections.  From surgical standpoint, we discussed the possibility for left thumb CMC arthroplasty in the future should symptoms refractory to conservative care.  I discussed the surgical treatment as well as the postop protocol in detail with patient as well  today for understanding.  At this juncture, understanding risks and benefits, she would like to proceed with cortisone injection to the left thumb CMC interval today for symptom relief.  Injection was tolerated well, she can return in approximately 6 weeks for recheck.    Follow-up: No follow-ups on file.   Meds & Orders: No orders of the defined types were placed in this encounter.   Orders Placed This Encounter  Procedures   Hand/UE Inj     Procedures: Hand/UE Inj: L thumb CMC for osteoarthritis on 06/15/2024 2:49 PM Indications: pain Details: 25 G needle Medications: 1 mL lidocaine  1 %; 6 mg betamethasone  acetate-betamethasone  sodium phosphate  6 (3-3) MG/ML Outcome: tolerated well, no immediate complications Procedure, treatment alternatives, risks and benefits explained, specific risks discussed.          Clinical History: No specialty comments available.  She reports that she has never smoked. She has never used smokeless tobacco. No results for input(s): HGBA1C, LABURIC in the last 8760 hours.  Objective:   Vital Signs: LMP 10/06/2011   Physical Exam  Gen: Well-appearing, in no acute distress; non-toxic CV: Regular Rate. Well-perfused. Warm.  Resp: Breathing unlabored on room air; no wheezing. Psych: Fluid speech in conversation; appropriate affect; normal thought process  Ortho Exam General: Patient is well appearing and in no distress.  Skin and Muscle: No skin changes are apparent to upper extremities.  Muscle bulk and contour normal, no signs of atrophy.     Range of Motion and Palpation Tests: Mobility is full about the elbows with flexion and extension.  Forearm supination and pronation are 85/85 bilaterally.  Wrist flexion/extension is 75/65 right side, wrist flexion/extension limited secondary to pain left side, approximately 65/55.  Digital flexion and extension are full.    No cords or nodules are palpated.  No triggering is observed.   Finklestein  test is minimal left side, no significant pain with minimal swelling and tenderness.    Neurologic, Vascular, Motor: Sensation is intact to light touch in the median/radial/ulnar distributions.   Fingers pink and well perfused.  Capillary refill is brisk.      Imaging: No results found.  Past Medical/Family/Surgical/Social History: Medications & Allergies reviewed per EMR, new medications updated. 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 Lesta Fitz to evaluate anemia; pathology showed a tubulovillous adenoma with no high grade dysplasia or malignancy identified.     Anemia    Anxiety    Acute stress reaction to multiple ICD shocks   Appendicitis, acute, s/p apendectomy 01/17/2013   Atrial fibrillation (HCC)    S/P radiofrequency ablation by Dr. Alm Needle at [facility] on 02/09/2008 and again on 03/31/2009; S/P ablation at [facility] by Dr. Clent Members on 08/10/2010, followed by recurrent atrial flutter/fibrillation.  Cardiac cath done on 02/19/2011 at [facility] showed no significant coronary disease.   Patient underwent Convergent Procedure at [facility] on 03/07/2011.    Atrial tachycardia    Status post convergent ablation [facility] October 2012.  She underwent successful catheter ablation of persistent atrial tachycardia at [facility] by Dr. Lorean on 05/24/2014.     CHF (congestive heart failure) (HCC)    per patient sees Dr. Redell Primrose   Cholecystitis, acute 07/2001   S/P laparoscopic cholecystectomy, intraoperative cholangiogram, and transcystic common bile duct exploration by Dr. Krystal Russell on 07/14/2001   Dizziness    Family history of adverse reaction to anesthesia    per patient sister had nausea and vomitting   Fibroids    GERD (gastroesophageal reflux disease)    GI bleed 05/25/2011   Heart murmur    per patient found at the age of 20   Hyperglycemia    HYPERGLYCEMIA 12/24/2008   Qualifier: Diagnosis of  By: Lanis MD, Christopher     Hypertrophic obstructive cardiomyopathy (HOCM) (HCC)    TEE 09/02/2007 showed moderate to severe left ventricular hypertrophy with an interventricular septal dimension of 1.5 cm and posterior wall dimension of 7 cm; there was normal LV systolic function and estimated EF of approximately 60%; there did not appear to be any obvious outflow tract obstruction.    Internal hemorrhoids 08/16/2022   Noted on Colonoscopy 07/05/22. Medium sized   Intra-abdominal abscess (HCC) 04/11/2019   Intraductal papilloma of left breast 05/26/2020   Found on Mammography, Plan for Lumpectomy with  Dr Vanderbilt after cardiac clearance   Pelvic mass    Pelvic ultrasound done on 11/30/10 to evaluate abdominal pain showed,  positioned between the ovaries and contiguous with the ovaries, a complex mass demonstrating posterior acoustical enhancement and no definite intralesional flow with color Doppler exam measuring 5.2 x 3.1 x 5.5 cm. The appearance raised the possibility of a ruptured ovarian cyst with subsequent development of a hematoma.    PONV (postoperative nausea and vomiting)    per  patient about 11 years ago but hasn't happened since then   Presence of permanent cardiac pacemaker    per patient pacemaker and defibrillator   Seasonal allergic rhinitis    Sigmoid diverticulosis 08/16/2022   Noted on Colonoscopy 07/05/22, few small mouthed   Ventricular tachycardia (HCC)    Hx of nonsustained ventricular tachycardia, S/P hospitalization in March 2009; S/P ICD placement by Dr. Elspeth KYM Sage on 09/25/2007.   Family History  Problem Relation Age of Onset   Hypertrophic cardiomyopathy Sister    Heart disease Sister    Hypertension Sister    Throat cancer Father    Coronary artery disease Father 36       S/P CABG   Heart disease Father    Cancer Father        throat   Colon polyps Mother    Hypertrophic cardiomyopathy Mother    Heart disease Mother    Hyperlipidemia Mother    Cancer Maternal Aunt        breast   Heart disease Sister    Hypertrophic cardiomyopathy Cousin        3 cousins on mother's side Deceased   Breast cancer Paternal Aunt    Hypertrophic cardiomyopathy Maternal Grandmother    Hypertrophic cardiomyopathy Other        great uncle   Ovarian cancer Neg Hx        great aunt had   Colon cancer Neg Hx    Lung cancer Neg Hx    Diabetes Neg Hx    Past Surgical History:  Procedure Laterality Date   APPENDECTOMY     BREAST LUMPECTOMY WITH RADIOACTIVE SEED LOCALIZATION Left 07/07/2020   Procedure: LEFT BREAST LUMPECTOMY WITH RADIOACTIVE SEED LOCALIZATION;  Surgeon: Vanderbilt Ned, MD;  Location: [facility] OR;  Service: General;  Laterality: Left;   CARDIAC DEFIBRILLATOR PLACEMENT  09/25/2007; 08/2013   Placed by Dr. Elspeth KYM Sage on 09/25/2007; gen change 08/2013 by Dr Sage (STJ dual chamber ICD)   CHOLECYSTECTOMY     Convergent Ablation  03/07/2011   Patient underwent Convergent Procedure at [facility] on 03/07/2011.   FLEXIBLE SIGMOIDOSCOPY  05/26/2011   Procedure: FLEXIBLE SIGMOIDOSCOPY;  Surgeon: Jerrell KYM Sol, MD;  Location: [facility] ENDOSCOPY;   Service: Endoscopy;  Laterality: N/A;   IMPLANTABLE CARDIOVERTER DEFIBRILLATOR (ICD) GENERATOR CHANGE N/A 08/12/2013   Procedure: ICD GENERATOR CHANGE;  Surgeon: Elspeth JAYSON Sage, MD;  Location: [facility] CATH LAB;  Service: Cardiovascular;  Laterality: N/A;   LAPAROSCOPIC APPENDECTOMY N/A 01/17/2013   Procedure: APPENDECTOMY LAPAROSCOPIC;  Surgeon: Donnice Bury, MD;  Location: [facility] OR;  Service: General;  Laterality: N/A;   LAPAROSCOPIC CHOLECYSTECTOMY  07/14/2001   S/P laparoscopic cholecystectomy, intraoperative cholangiogram, and transcystic common bile duct exploration by Dr. Krystal Russell on 07/14/2001   RADIOFREQUENCY ABLATION  08/10/2010   ablation  done at [facility]   RADIOFREQUENCY ABLATION  03/11/2009   ablation at [facility]   RADIOFREQUENCY ABLATION  01/10/2008   ablation at [facility]   RADIOFREQUENCY ABLATION  05/24/2014  Successful radiofrequency catheter ablation by Dr. Lorean at [facility].   Social History   Occupational History   Occupation: Contractor: [employer]  Tobacco Use   Smoking status: Never   Smokeless tobacco: Never  Vaping Use   Vaping status: Never Used  Substance and Sexual Activity   Alcohol use: Yes    Comment: Occasionally.   Drug use: No   Sexual activity: Yes    Partners: Female    Birth control/protection: Post-menopausal    Azarie Coriz Estela) Arlinda, M.D. [facility], Hand Surgery  "

## 2024-07-13 ENCOUNTER — Ambulatory Visit: Payer: 59 | Admitting: Obstetrics and Gynecology

## 2024-08-10 ENCOUNTER — Ambulatory Visit: Admitting: Orthopedic Surgery

## 2024-08-24 ENCOUNTER — Ambulatory Visit: Admitting: Obstetrics and Gynecology
